# Patient Record
Sex: Female | Born: 1950 | Race: White | Hispanic: No | Marital: Married | Smoking: Never smoker
Health system: Southern US, Community
[De-identification: ages and names within clinical notes are randomized; demographics above are authoritative.]

## PROBLEM LIST (undated history)

## (undated) DIAGNOSIS — R209 Unspecified disturbances of skin sensation: Secondary | ICD-10-CM

## (undated) DIAGNOSIS — G4700 Insomnia, unspecified: Secondary | ICD-10-CM

## (undated) DIAGNOSIS — M1990 Unspecified osteoarthritis, unspecified site: Secondary | ICD-10-CM

## (undated) DIAGNOSIS — IMO0001 Reserved for inherently not codable concepts without codable children: Secondary | ICD-10-CM

## (undated) DIAGNOSIS — G5139 Clonic hemifacial spasm, unspecified: Principal | ICD-10-CM

## (undated) DIAGNOSIS — N200 Calculus of kidney: Secondary | ICD-10-CM

## (undated) DIAGNOSIS — F411 Generalized anxiety disorder: Secondary | ICD-10-CM

## (undated) DIAGNOSIS — F329 Major depressive disorder, single episode, unspecified: Secondary | ICD-10-CM

## (undated) DIAGNOSIS — J309 Allergic rhinitis, unspecified: Secondary | ICD-10-CM

## (undated) DIAGNOSIS — E039 Hypothyroidism, unspecified: Secondary | ICD-10-CM

## (undated) DIAGNOSIS — IMO0002 Reserved for concepts with insufficient information to code with codable children: Secondary | ICD-10-CM

## (undated) DIAGNOSIS — E1165 Type 2 diabetes mellitus with hyperglycemia: Secondary | ICD-10-CM

## (undated) DIAGNOSIS — K219 Gastro-esophageal reflux disease without esophagitis: Secondary | ICD-10-CM

## (undated) DIAGNOSIS — R609 Edema, unspecified: Secondary | ICD-10-CM

## (undated) DIAGNOSIS — R32 Unspecified urinary incontinence: Secondary | ICD-10-CM

## (undated) DIAGNOSIS — I10 Essential (primary) hypertension: Secondary | ICD-10-CM

## (undated) DIAGNOSIS — K279 Peptic ulcer, site unspecified, unspecified as acute or chronic, without hemorrhage or perforation: Secondary | ICD-10-CM

## (undated) DIAGNOSIS — E7800 Pure hypercholesterolemia, unspecified: Secondary | ICD-10-CM

## (undated) DIAGNOSIS — R002 Palpitations: Secondary | ICD-10-CM

## (undated) DIAGNOSIS — R9089 Other abnormal findings on diagnostic imaging of central nervous system: Secondary | ICD-10-CM

## (undated) DIAGNOSIS — G40109 Localization-related (focal) (partial) symptomatic epilepsy and epileptic syndromes with simple partial seizures, not intractable, without status epilepticus: Secondary | ICD-10-CM

## (undated) DIAGNOSIS — E785 Hyperlipidemia, unspecified: Secondary | ICD-10-CM

## (undated) DIAGNOSIS — F3289 Other specified depressive episodes: Secondary | ICD-10-CM

## (undated) DIAGNOSIS — B373 Candidiasis of vulva and vagina: Secondary | ICD-10-CM

## (undated) DIAGNOSIS — N814 Uterovaginal prolapse, unspecified: Secondary | ICD-10-CM

## (undated) DIAGNOSIS — N816 Rectocele: Secondary | ICD-10-CM

## (undated) HISTORY — DX: Reserved for concepts with insufficient information to code with codable children: IMO0002

## (undated) HISTORY — DX: Palpitations: R00.2

## (undated) HISTORY — PX: HYSTEROSCOPY: SHX211

## (undated) HISTORY — DX: Type 2 diabetes mellitus with hyperglycemia: E11.65

## (undated) HISTORY — DX: Hypothyroidism, unspecified: E03.9

## (undated) HISTORY — DX: Localization-related (focal) (partial) symptomatic epilepsy and epileptic syndromes with simple partial seizures, not intractable, without status epilepticus: G40.109

## (undated) HISTORY — DX: Reserved for inherently not codable concepts without codable children: IMO0001

## (undated) HISTORY — PX: TONGUE BIOPSY: SHX1075

## (undated) HISTORY — DX: Insomnia, unspecified: G47.00

## (undated) HISTORY — DX: Essential (primary) hypertension: I10

## (undated) HISTORY — DX: Other abnormal findings on diagnostic imaging of central nervous system: R90.89

## (undated) HISTORY — DX: Unspecified osteoarthritis, unspecified site: M19.90

## (undated) HISTORY — DX: Hyperlipidemia, unspecified: E78.5

## (undated) HISTORY — DX: Uterovaginal prolapse, unspecified: N81.4

## (undated) HISTORY — PX: ENDOMETRIAL BIOPSY: SHX622

## (undated) HISTORY — DX: Candidiasis of vulva and vagina: B37.3

## (undated) HISTORY — DX: Other specified depressive episodes: F32.89

## (undated) HISTORY — DX: Peptic ulcer, site unspecified, unspecified as acute or chronic, without hemorrhage or perforation: K27.9

## (undated) HISTORY — DX: Rectocele: N81.6

## (undated) HISTORY — DX: Allergic rhinitis, unspecified: J30.9

## (undated) HISTORY — DX: Gastro-esophageal reflux disease without esophagitis: K21.9

## (undated) HISTORY — DX: Unspecified urinary incontinence: R32

## (undated) HISTORY — DX: Generalized anxiety disorder: F41.1

## (undated) HISTORY — DX: Clonic hemifacial spasm, unspecified: G51.39

## (undated) HISTORY — DX: Major depressive disorder, single episode, unspecified: F32.9

## (undated) HISTORY — PX: DILATION AND CURETTAGE OF UTERUS: SHX78

## (undated) HISTORY — DX: Unspecified disturbances of skin sensation: R20.9

## (undated) HISTORY — DX: Calculus of kidney: N20.0

## (undated) HISTORY — DX: Edema, unspecified: R60.9

## (undated) HISTORY — DX: Pure hypercholesterolemia, unspecified: E78.00

---
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
## 1998-09-19 ENCOUNTER — Other Ambulatory Visit: Admission: RE | Admit: 1998-09-19 | Discharge: 1998-09-19 | Payer: Self-pay | Admitting: Obstetrics and Gynecology

## 1999-06-10 ENCOUNTER — Encounter (INDEPENDENT_AMBULATORY_CARE_PROVIDER_SITE_OTHER): Payer: Self-pay | Admitting: Gastroenterology

## 1999-08-27 ENCOUNTER — Other Ambulatory Visit: Admission: RE | Admit: 1999-08-27 | Discharge: 1999-08-27 | Payer: Self-pay | Admitting: Obstetrics and Gynecology

## 2000-09-06 ENCOUNTER — Other Ambulatory Visit: Admission: RE | Admit: 2000-09-06 | Discharge: 2000-09-06 | Payer: Self-pay | Admitting: Obstetrics and Gynecology

## 2000-12-29 ENCOUNTER — Ambulatory Visit: Admission: RE | Admit: 2000-12-29 | Discharge: 2000-12-29 | Payer: Self-pay | Admitting: Gynecology

## 2001-01-19 ENCOUNTER — Encounter: Payer: Self-pay | Admitting: Urology

## 2001-01-19 ENCOUNTER — Encounter: Admission: RE | Admit: 2001-01-19 | Discharge: 2001-01-19 | Payer: Self-pay | Admitting: Urology

## 2001-01-28 ENCOUNTER — Encounter: Admission: RE | Admit: 2001-01-28 | Discharge: 2001-01-28 | Payer: Self-pay | Admitting: Urology

## 2001-01-28 ENCOUNTER — Encounter: Payer: Self-pay | Admitting: Urology

## 2001-11-03 ENCOUNTER — Other Ambulatory Visit: Admission: RE | Admit: 2001-11-03 | Discharge: 2001-11-03 | Payer: Self-pay | Admitting: Obstetrics and Gynecology

## 2003-01-19 ENCOUNTER — Other Ambulatory Visit: Admission: RE | Admit: 2003-01-19 | Discharge: 2003-01-19 | Payer: Self-pay | Admitting: Obstetrics and Gynecology

## 2003-08-02 ENCOUNTER — Encounter (INDEPENDENT_AMBULATORY_CARE_PROVIDER_SITE_OTHER): Payer: Self-pay | Admitting: *Deleted

## 2003-08-02 ENCOUNTER — Ambulatory Visit (HOSPITAL_BASED_OUTPATIENT_CLINIC_OR_DEPARTMENT_OTHER): Admission: RE | Admit: 2003-08-02 | Discharge: 2003-08-02 | Payer: Self-pay | Admitting: Obstetrics and Gynecology

## 2003-08-02 ENCOUNTER — Ambulatory Visit (HOSPITAL_COMMUNITY): Admission: RE | Admit: 2003-08-02 | Discharge: 2003-08-02 | Payer: Self-pay | Admitting: Obstetrics and Gynecology

## 2004-01-23 ENCOUNTER — Other Ambulatory Visit: Admission: RE | Admit: 2004-01-23 | Discharge: 2004-01-23 | Payer: Self-pay | Admitting: Obstetrics and Gynecology

## 2005-01-23 ENCOUNTER — Other Ambulatory Visit: Admission: RE | Admit: 2005-01-23 | Discharge: 2005-01-23 | Payer: Self-pay | Admitting: Obstetrics and Gynecology

## 2006-01-26 ENCOUNTER — Other Ambulatory Visit: Admission: RE | Admit: 2006-01-26 | Discharge: 2006-01-26 | Payer: Self-pay | Admitting: Obstetrics and Gynecology

## 2006-11-16 ENCOUNTER — Ambulatory Visit: Payer: Self-pay | Admitting: Gastroenterology

## 2006-12-21 ENCOUNTER — Ambulatory Visit: Payer: Self-pay | Admitting: Gastroenterology

## 2007-02-10 ENCOUNTER — Other Ambulatory Visit: Admission: RE | Admit: 2007-02-10 | Discharge: 2007-02-10 | Payer: Self-pay | Admitting: Addiction Medicine

## 2007-09-24 DIAGNOSIS — I10 Essential (primary) hypertension: Secondary | ICD-10-CM | POA: Insufficient documentation

## 2007-09-24 DIAGNOSIS — F3289 Other specified depressive episodes: Secondary | ICD-10-CM | POA: Insufficient documentation

## 2007-09-24 DIAGNOSIS — K219 Gastro-esophageal reflux disease without esophagitis: Secondary | ICD-10-CM

## 2007-09-24 DIAGNOSIS — R519 Headache, unspecified: Secondary | ICD-10-CM | POA: Insufficient documentation

## 2007-09-24 DIAGNOSIS — E0590 Thyrotoxicosis, unspecified without thyrotoxic crisis or storm: Secondary | ICD-10-CM | POA: Insufficient documentation

## 2007-09-24 DIAGNOSIS — N200 Calculus of kidney: Secondary | ICD-10-CM

## 2007-09-24 DIAGNOSIS — F329 Major depressive disorder, single episode, unspecified: Secondary | ICD-10-CM | POA: Insufficient documentation

## 2007-09-24 DIAGNOSIS — K449 Diaphragmatic hernia without obstruction or gangrene: Secondary | ICD-10-CM | POA: Insufficient documentation

## 2007-09-24 DIAGNOSIS — R51 Headache: Secondary | ICD-10-CM

## 2007-09-27 ENCOUNTER — Encounter: Payer: Self-pay | Admitting: Gastroenterology

## 2008-02-29 ENCOUNTER — Other Ambulatory Visit: Admission: RE | Admit: 2008-02-29 | Discharge: 2008-02-29 | Payer: Self-pay | Admitting: Obstetrics and Gynecology

## 2008-02-29 ENCOUNTER — Ambulatory Visit: Payer: Self-pay | Admitting: Obstetrics and Gynecology

## 2008-02-29 ENCOUNTER — Encounter: Payer: Self-pay | Admitting: Obstetrics and Gynecology

## 2008-06-12 ENCOUNTER — Encounter: Admission: RE | Admit: 2008-06-12 | Discharge: 2008-06-12 | Payer: Self-pay | Admitting: Internal Medicine

## 2009-05-15 ENCOUNTER — Other Ambulatory Visit: Admission: RE | Admit: 2009-05-15 | Discharge: 2009-05-15 | Payer: Self-pay | Admitting: Obstetrics and Gynecology

## 2009-05-15 ENCOUNTER — Ambulatory Visit: Payer: Self-pay | Admitting: Obstetrics and Gynecology

## 2010-06-04 ENCOUNTER — Other Ambulatory Visit
Admission: RE | Admit: 2010-06-04 | Discharge: 2010-06-04 | Payer: Self-pay | Source: Home / Self Care | Admitting: Obstetrics and Gynecology

## 2010-06-04 ENCOUNTER — Ambulatory Visit
Admission: RE | Admit: 2010-06-04 | Discharge: 2010-06-04 | Payer: Self-pay | Source: Home / Self Care | Attending: Obstetrics and Gynecology | Admitting: Obstetrics and Gynecology

## 2010-06-24 ENCOUNTER — Ambulatory Visit
Admission: RE | Admit: 2010-06-24 | Discharge: 2010-06-24 | Payer: Self-pay | Source: Home / Self Care | Attending: Obstetrics and Gynecology | Admitting: Obstetrics and Gynecology

## 2010-10-14 NOTE — Assessment & Plan Note (Signed)
[facility]                                 ON-CALL NOTE   NAME:SELFMarijke, Guadiana                           MRN:          [id number]  DATE:12/21/2006                            DOB:          24-Nov-1950    Ms. Kostelecky had a colonoscopy today.  The procedure went very smoothly.  I  believe it was essentially normal.  No polyps were removed.  She has  done well except this afternoon after she woke up, she had a hamburger  and then has felt some indigestion and some discomfort, not in her  abdomen but in her very low substernal area.  She has been belching a  bit.  She thinks this may be some indigestion or acid related problems.  He has already taken a Nexium about an hour ago and has not noticed much  difference.  I recommended she take a couple of Tums and a Gas-X if she  has that and she knows to call if she has any further trouble.  I  explained that I am on call all night and she knows to get back in  touch.     Rachael Fee, MD  Electronically Signed    DPJ/MedQ  DD: 12/21/2006  DT: 12/22/2006  Job #: [id number]

## 2010-10-14 NOTE — Assessment & Plan Note (Signed)
[facility]                         GASTROENTEROLOGY OFFICE NOTE   NAME:Fletcher, Kelly GUILE                         MRN:          [id number]  DATE:11/16/2006                            DOB:          07-05-1950    PRIMARY CARE PHYSICIAN:  Dr. Murray Hodgkins.  She was Rigdon referred.   CHIEF COMPLAINT:  Nausea, constipation, need colonoscopy.   HISTORY OF PRESENT ILLNESS:  [name] is a very pleasant 60 year old  woman who underwent screening colonoscopy by Dr. Victorino Dike in 2001.  He described a relatively poor prep, and has recommended that she have a  repeat colonoscopy for the past 2-3 years and she has declined that  advice.  She has been bothered by rather classic GERD symptoms for many  years with pyrosis.  The pyrosis seems to have improved on once daily  Nexium that she takes before going to bed at night.  She does have  rather chronic nausea that may be GERD related.  She had an EGD by Dr.  Victorino Dike in 2001.  He described mild esophagitis without Barrett's.  She has very rare, solid-food dysphagia.  She eats while standing up and  walking around.  She admits she does not chew her food very well.  She  has no vomiting, no overt GI bleeding.   She has chronic constipation.  She has tried fiber supplements for that,  but that caused too much gas.  She has tried Printmaker and that seems to  worsen the acid in her stomach.   REVIEW OF SYSTEMS:  Notable for a 15-pound weight gain in the past 6  months.   PAST MEDICAL HISTORY:  1. Hypertension.  2. Elevated cholesterol.  3. Depression.  4. Hyperthyroid.  5. History of kidney stones.  6. History of chronic headaches.  7. Stomach ulcer when she was 60 years old.   CURRENT MEDICATIONS:  1. Lipitor.  2. Nexium.  3. Levothyroxine.  4. Triamterine/hydrochlorothiazide.   ALLERGIES:  PAXIL and LIDOCAINE.   SOCIAL HISTORY:  Married with 1 son.  Lives with her husband, nonsmoker,  nondrinker.  Drinks  2 large [employer], half caffeinated daily.   FAMILY HISTORY:  No colon cancer, colon polyps in family.   PHYSICAL EXAMINATION:  Height 5 feet 9 inches, weight 199 pounds, blood  pressure 152/76, pulse 64.  CONSTITUTIONAL:  Generally well appearing.  NEUROLOGIC:  Alert and oriented x3.  EYES:  Extraocular movements intact.  MOUTH:  Oropharynx moist, no lesions.  NECK:  Supple, no lymphadenopathy.  CARDIOVASCULAR:  Heart regular rate and rhythm.  LUNGS:  Clear to auscultation bilaterally.  ABDOMEN:  Soft, nontender, nondistended, normal bowel sounds.  EXTREMITIES:  No lower extremity edema.  SKIN:  No rash or lesions on visible extremities.   ASSESSMENT AND PLAN:  A 60 year old woman with gastroesophageal reflux  disease, chronic constipation.   First, her colonoscopy in 2001, she had somewhat of a poor prep, and, I  think, she has been recommended to have followup colonoscopy since then.  She has declined this in the past, but is agreeable now.  We will,  therefore, arrange for her to have a colonoscopy at her soonest  convenience.  I would like, however, first to get her on a stable dose  of MiraLax.  She will, therefore, start MiraLax 1 scoop once daily.  She  knows she can adjust this up or down as needed.  She does have rather  classic pyrosis, but this is under control with the Nexium she takes on  a nightly basis.  Her nausea may be related to gastroesophageal reflux  disease.  I recommended she change the way she is taking her Nexium to  20-30 minutes prior to her dinner meal.  That is the way the pill is  designed to work most effectively.  I see no reason for any further  blood tests or imaging studies.  I will discuss her response to MiraLax  and this changing dose of her Nexium at the time of her colonoscopy in 2-  3 weeks.     Rachael Fee, MD  Electronically Signed    DPJ/MedQ  DD: 11/16/2006  DT: 11/16/2006  Job #: [id number]   cc:   Lenon Curt. Chilton Si, M.D.

## 2010-10-17 NOTE — Op Note (Signed)
NAME:  Kelly Fletcher, Kelly Fletcher                            ACCOUNT NO.:  [id number]   MEDICAL RECORD NO.:  [id number]                   PATIENT TYPE:  AMB   LOCATION:  [facility]                                 FACILITY:  [facility]   PHYSICIAN:  Daniel L. Eda Paschal, M.D.           DATE OF BIRTH:  [birth year]   DATE OF PROCEDURE:  08/02/2003  DATE OF DISCHARGE:                                 OPERATIVE REPORT   PREOPERATIVE DIAGNOSIS:  Postmenopausal bleeding with intrauterine cavity  lesion.   POSTOPERATIVE DIAGNOSIS:  Postmenopausal bleeding with intrauterine cavity  lesion.   OPERATION:  Hysteroscopy with resection of intrauterine cavity lesion.   SURGEON:  Daniel L. Eda Paschal, M.D.   ANESTHESIA:  General.   INDICATIONS:  The patient is a 60 year old female who presented to the  office with abnormal postmenopausal bleeding.  Ultrasound revealed an  abnormal endometrial cavity which was enlarged.  She now enters the hospital  for hysteroscopy, resection of this lesion, as well as sampling.   FINDINGS:  External exam is normal.  Vaginal examination reveals a second-  degree cystocele.  Cervix is clean.  Uterus is retroverted, normal size and  shape with almost second-degree descensus.  Adnexa are palpably normal.  At  the time of hysteroscopy, the patient had a large 2-3 cm lesion coming off  the lower posterior wall of the fundus.  It appeared to have some  consistency somewhat between a myoma and a polyp.  After it was removed, the  rest of the cavity as well as the endocervical canal were free of disease.   DESCRIPTION OF PROCEDURE:  After adequate general endotracheal anesthesia,  the patient was placed in the dorsal lithotomy position, prepped and draped  in the usual sterile manner.  A single-tooth tenaculum was placed in the  anterior lip of the cervix.  The cervix was dilated to a #31 Pratt dilator.  A hysteroscopic resectoscope was used with a 9-degree wire loop with  settings of 70 coag,  110 cutting.  Sorbitol 3% was used to expand the  intrauterine cavity, and a camera was used for magnification.  A large  lesion was seen as described above.  Using the 90-degree wire loop, this  area was resected in multiple bites until it was completely removed.  The  surgeon really could not be sure whether it was a myoma or a polyp.  The  consistency was too hard for a normal polyp, but I was not completely sure  that it was a myoma.  After it had been completely resected, all the small  bleeding points were coagulated, and the procedure was terminated.  Fluid  deficit was less than 200 mL.  Blood loss was less than 50 mL.  The patient  tolerated the procedure well and left the operating room in satisfactory  condition.  Daniel L. Eda Paschal, M.D.    Tonette Bihari  D:  08/02/2003  T:  08/02/2003  Job:  [id number]

## 2010-10-17 NOTE — Consult Note (Signed)
[facility]  Patient:    Kelly Fletcher, Kelly Fletcher                         MRN: [id number] Proc. Date: 12/29/00 Adm. Date:  [id number] Attending:  Jeannette Corpus CC:         Rande Brunt. Eda Paschal, M.D.  Telford Nab, R.N.   Consultation Report  CLINIC NOTE  This is a 60 year old white female who was referred by Dr. Edyth Gunnels for consultation regarding management of a elevated CA-125. The patient has had ovarian cysts followed off and on for the past two years by serial ultrasounds. The two most recent ultrasounds are provided for my review indicating that in April the patient had a complex cyst in her left ovary measuring 19 x 21 x 20 mm and a thick walled cyst in the right ovary measuring 12 x 8 x 9 mm with internal low level echoes. The patient also noted to have myomas of the uterus which are on the order of slightly over 2 cm in diameter. At that time (April), the patients CA-125 value was 23. Followup ultrasound on July 3 showed resolution of the right ovarian cyst and essentially resolving follicle in the left cyst as well. The myomas were essentially unchanged. However, her CA-125 value was 43 units/ml. The patient denies any pelvic pain or pressure. She continues to have relatively regular cyclic menses, although she has skipped a period in the winter.  The patient does have some concern because her mother may have had ovarian cancer, dying at age 58 within approximately three weeks from the time of initial diagnosis. No autopsy or surgery was performed so we are uncertain as to the exact diagnosis.  PAST MEDICAL HISTORY:  Medical illnesses: Elevated cholesterol, seasonal allergies, hypothyroidism.  PAST SURGICAL HISTORY:  Tonsils and adenoidectomy.  OBSTETRICAL HISTORY:  Gravida 1.  DRUG ALLERGIES:  PAXIL causes numbness in her face and arm.  CURRENT MEDICATIONS:  Synthroid, Lipitor, Clarinex, Flonase,  and antihypertensive.  REVIEW OF SYSTEMS:  Essentially negative.  PHYSICAL EXAMINATION:  VITAL SIGNS:  Height 5 feet 9 inches. Weight 192 pounds. Blood pressure 160/90, pulse 86, respiratory rate 18.  GENERAL:  Healthy white female in no acute distress.  HEENT:  Negative.  NECK:  Supple without thyromegaly.  ABDOMEN:  Soft, nontender. No masses, organomegaly, ascites, or hernias are noted.  PELVIC:  EGBUS normal. Vagina is clean. Cervix is normal. Uterus is anterior, normal shape, size, and consistency. I cannot palpate any adnexal masses. Rectovaginal exam confirms.  IMPRESSION:  Patient with an elevated CA-125 and uterine fibroids. It is my opinion that the patient does not have ovarian cancer, and that it is most likely her CA-125 is secondary to her uterine fibroids. While it is elevated, it is relatively low and might be further explained depending upon the portion of the menstrual cycle she was in when it was drawn.  PLAN:  I would recommend the patient have a repeat CA-125 value in approximately three months and that we not draw the CA-125 during her menstrual period. We will at this position continue to observe her but would not recommend any surgical intervention at this time. She will return to Dr. Delorise Royals office in approximately three months for a repeat CA-125 value. DD:  12/29/00 TD:  12/29/00 Job: [id number]

## 2010-12-23 ENCOUNTER — Encounter: Payer: Self-pay | Admitting: Obstetrics and Gynecology

## 2011-07-22 ENCOUNTER — Ambulatory Visit (INDEPENDENT_AMBULATORY_CARE_PROVIDER_SITE_OTHER): Payer: 59 | Admitting: Obstetrics and Gynecology

## 2011-07-22 ENCOUNTER — Other Ambulatory Visit (HOSPITAL_COMMUNITY)
Admission: RE | Admit: 2011-07-22 | Discharge: 2011-07-22 | Disposition: A | Discharge status: Home / Self Care | Payer: 59 | Source: Ambulatory Visit | Attending: Obstetrics and Gynecology | Admitting: Obstetrics and Gynecology

## 2011-07-22 ENCOUNTER — Encounter: Payer: Self-pay | Admitting: Obstetrics and Gynecology

## 2011-07-22 VITALS — BP 136/80 | Ht 69.0 in | Wt 167.0 lb

## 2011-07-22 DIAGNOSIS — Z01419 Encounter for gynecological examination (general) (routine) without abnormal findings: Secondary | ICD-10-CM | POA: Insufficient documentation

## 2011-07-22 DIAGNOSIS — N898 Other specified noninflammatory disorders of vagina: Secondary | ICD-10-CM

## 2011-07-22 DIAGNOSIS — R32 Unspecified urinary incontinence: Secondary | ICD-10-CM | POA: Insufficient documentation

## 2011-07-22 DIAGNOSIS — N899 Noninflammatory disorder of vagina, unspecified: Secondary | ICD-10-CM

## 2011-07-22 LAB — WET PREP FOR TRICH, YEAST, CLUE: Yeast Wet Prep HPF POC: NONE SEEN

## 2011-07-22 NOTE — Progress Notes (Signed)
Patient came to see me today for her annual GYN exam. She is doing well without HRT. She is having no vaginal bleeding. She is having no pelvic pain. She is up-to-date on mammograms. She's had 2 normal bone densities. She does her lab her PCP. She had some vaginal itching and treated her Delduca with over-the-counter medication. She is now asymptomatic but wanted  to be sure was gone.  Physical examination:  Kelly Fletcher present. HEENT within normal limits. Neck: Thyroid not large. No masses. Supraclavicular nodes: not enlarged. Breasts: Examined in both sitting midline position. No skin changes and no masses. Abdomen: Soft no guarding rebound or masses or hernia. Pelvic: External: Within normal limits. BUS: Within normal limits. Vaginal:within normal limits. Good estrogen effect. No evidence of cystocele rectocele or enterocele. Cervix: clean. Uterus: Normal size and shape. Adnexa: No masses. Rectovaginal exam: Confirmatory and negative. Extremities: Within normal limits.  Wet prep negative.  Assessment: Normal GYN exam  Plan: Continue yearly mammograms

## 2011-07-23 LAB — URINALYSIS W MICROSCOPIC + REFLEX CULTURE
Bacteria, UA: NONE SEEN
Bilirubin Urine: NEGATIVE
Casts: NONE SEEN
Crystals: NONE SEEN
Glucose, UA: NEGATIVE mg/dL
Hgb urine dipstick: NEGATIVE
Ketones, ur: NEGATIVE mg/dL
Nitrite: NEGATIVE
Specific Gravity, Urine: 1.014 (ref 1.005–1.030)
pH: 7 (ref 5.0–8.0)

## 2011-12-28 ENCOUNTER — Encounter: Payer: Self-pay | Admitting: Obstetrics and Gynecology

## 2012-11-08 ENCOUNTER — Other Ambulatory Visit: Payer: Self-pay | Admitting: *Deleted

## 2012-11-08 ENCOUNTER — Telehealth: Payer: Self-pay | Admitting: *Deleted

## 2012-11-08 MED ORDER — HYDROCHLOROTHIAZIDE 25 MG PO TABS: ORAL_TABLET | ORAL | Status: DC

## 2012-11-08 NOTE — Telephone Encounter (Signed)
Patient stated that when she was in last  Dr Chilton Si told her to cut her Losartan in half. Patient was wondering if she could have a low dose fluid pill when she needs it.  [phone] [facility]

## 2012-11-08 NOTE — Telephone Encounter (Signed)
Patient has been notified

## 2012-11-08 NOTE — Telephone Encounter (Signed)
Tell her we will send Rx for HCTZ 25 mg (30) One daily as needed to control edema

## 2012-12-09 ENCOUNTER — Encounter: Payer: Self-pay | Admitting: *Deleted

## 2012-12-09 ENCOUNTER — Other Ambulatory Visit: Payer: 59

## 2012-12-09 DIAGNOSIS — E079 Disorder of thyroid, unspecified: Secondary | ICD-10-CM

## 2012-12-09 DIAGNOSIS — I10 Essential (primary) hypertension: Secondary | ICD-10-CM

## 2012-12-09 DIAGNOSIS — E119 Type 2 diabetes mellitus without complications: Secondary | ICD-10-CM

## 2012-12-10 LAB — COMPREHENSIVE METABOLIC PANEL
Albumin: 4.1 g/dL (ref 3.6–4.8)
Alkaline Phosphatase: 47 IU/L (ref 39–117)
BUN/Creatinine Ratio: 22 (ref 11–26)
BUN: 17 mg/dL (ref 8–27)
CO2: 25 mmol/L (ref 18–29)
Calcium: 9.3 mg/dL (ref 8.6–10.2)
Creatinine, Ser: 0.78 mg/dL (ref 0.57–1.00)
Globulin, Total: 2.1 g/dL (ref 1.5–4.5)
Glucose: 100 mg/dL — ABNORMAL HIGH (ref 65–99)
Total Protein: 6.2 g/dL (ref 6.0–8.5)

## 2012-12-10 LAB — LIPID PANEL
Chol/HDL Ratio: 2.6 ratio units (ref 0.0–4.4)
Cholesterol, Total: 147 mg/dL (ref 100–199)
HDL: 57 mg/dL (ref >39)
LDL Calculated: 80 mg/dL (ref 0–99)
Triglycerides: 52 mg/dL (ref 0–149)
VLDL Cholesterol Cal: 10 mg/dL (ref 5–40)

## 2012-12-12 ENCOUNTER — Encounter: Payer: Self-pay | Admitting: *Deleted

## 2012-12-13 ENCOUNTER — Ambulatory Visit (INDEPENDENT_AMBULATORY_CARE_PROVIDER_SITE_OTHER): Payer: 59 | Admitting: Internal Medicine

## 2012-12-13 ENCOUNTER — Encounter: Payer: Self-pay | Admitting: Internal Medicine

## 2012-12-13 VITALS — BP 156/78 | HR 66 | Temp 98.0°F | Resp 14 | Ht 69.0 in | Wt 159.0 lb

## 2012-12-13 DIAGNOSIS — E039 Hypothyroidism, unspecified: Secondary | ICD-10-CM

## 2012-12-13 DIAGNOSIS — F411 Generalized anxiety disorder: Secondary | ICD-10-CM | POA: Insufficient documentation

## 2012-12-13 DIAGNOSIS — K219 Gastro-esophageal reflux disease without esophagitis: Secondary | ICD-10-CM

## 2012-12-13 DIAGNOSIS — I10 Essential (primary) hypertension: Secondary | ICD-10-CM

## 2012-12-13 DIAGNOSIS — R609 Edema, unspecified: Secondary | ICD-10-CM

## 2012-12-13 DIAGNOSIS — E079 Disorder of thyroid, unspecified: Secondary | ICD-10-CM

## 2012-12-13 DIAGNOSIS — E119 Type 2 diabetes mellitus without complications: Secondary | ICD-10-CM

## 2012-12-13 DIAGNOSIS — E785 Hyperlipidemia, unspecified: Secondary | ICD-10-CM

## 2012-12-13 MED ORDER — LORAZEPAM 1 MG PO TABS: ORAL_TABLET | ORAL | Status: DC

## 2012-12-13 MED ORDER — LOSARTAN POTASSIUM 50 MG PO TABS: 50.0000 mg | ORAL_TABLET | Freq: Every day | ORAL | Status: DC

## 2012-12-13 MED ORDER — ATORVASTATIN CALCIUM 10 MG PO TABS: ORAL_TABLET | ORAL | Status: DC

## 2012-12-13 NOTE — Progress Notes (Signed)
Date: 12/13/2012  MRN:  [id number] Name:  Kelly Fletcher Sex:  female Age:  62 y.o. DOB:[birth year]   [facility] #:      [id number]                 Level Of Care:independent Provider: Murray Hodgkins, MD  Emergency Contacts: Contact Information   Name Relation Home Work Mobile   Garner,Patrick Other [phone]        Code Status: Full code  Allergies: Allergies  Allergen Reactions  . Latex   . Lipitor (Atorvastatin)   . Naproxen   . Nitrofurantoin Monohyd Macro   . Paroxetine Hcl   . Prednisone   . Prozac (Fluoxetine Hcl)      Chief Complaint  Patient presents with  . Annual Exam    Complains of BP going up and down     HPI: Generally feeling Ok. Mild elevations in SBP. Sometimes gets low BP. DM is under control.  Type II or unspecified type diabetes mellitus without mention of complication, not stated as uncontrolled -   Unspecified essential hypertension -having episodes of high and low Bp. BP has been about 160 systolic for more than a wee. She has started a new supplement call [employer]. Product literature is unclear regarding ingredients.  Other and unspecified hyperlipidemia  Anxiety state, unspecified - Using LORazepam (ATIVAN) 1 MG tablet  GERD: episodes of heartburn.  Unspecified hypothyroidism: controlled  Edema: generally better. Still using HCTZ when she feels swollen     Past Medical History  Diagnosis Date  . Endometrial polyp   . Uterine prolapse   . Cystocele   . GERD (gastroesophageal reflux disease)   . Elevated cholesterol   . Rectocele   . Hypertension   . Arthritis   . Thyroid disease   . Type II or unspecified type diabetes mellitus without mention of complication, uncontrolled     Type 2  . Urinary incontinence     Urgency  . Anxiety state, unspecified   . Palpitations   . Allergic rhinitis, cause unspecified   . Other and unspecified hyperlipidemia   . Unspecified hypothyroidism   . Edema   . Insomnia, unspecified   .  Depressive disorder, not elsewhere classified   . Unspecified urinary incontinence   . Peptic ulcer, unspecified site, unspecified as acute or chronic, without mention of hemorrhage, perforation, or obstruction   . Calculus of kidney   . Disturbance of skin sensation     Past Surgical History  Procedure Laterality Date  . Tonsillectomy    . Dilation and curettage of uterus      3-05  . Hysteroscopy      3-05     Procedures: 1980-EGD 1986-ETT:Normal 1997-IVP:2 stones left side 1998-Ultrasound abdomen:HH, Reflux, spasm of pyloric channel 1999-RNA:Negative 2002-Renal Ultrasound/Pelvic Ultrasound:Normal 2002-CT Abdomen:Normal 11-2006 Colonoscopy Dr. Gerilyn Pilgrim 11-2006 Mammogram 01/2008 Pap Normal Dr. Edyth Gunnels  11/2007 Mammogram Normal  06/2010 Bone Density normal for age Dr. Eda Paschal  12/22/2010 Mammogram negative   Consultants: GI-Dr.Fairbury Allergist-Dr.McDonough Hand Surgeon-Dr.Sypher Neurologist-Dr.Adelman GYN-Dr.Gottsegen Urologist-Dr.Ottelin Cardiologist-Dr.Grove Orthopedic-Dr.Norris Acupuncture-Dr.Mitchell Bloom Eye-Dr.Sallie Hyacinth Meeker GI -Dr. Gerilyn Pilgrim    Current Outpatient Prescriptions  Medication Sig Dispense Refill  . cholecalciferol (VITAMIN D) 1000 UNITS tablet Take 1,000 Units by mouth daily.      Marland Kitchen co-enzyme Q-10 30 MG capsule Take 30 mg by mouth 3 (three) times daily.      Marland Kitchen esomeprazole (NEXIUM) 40 MG capsule Take 40 mg by mouth daily before breakfast.      .  fish oil-omega-3 fatty acids 1000 MG capsule Take 2 g by mouth daily.      . hydrochlorothiazide (HYDRODIURIL) 25 MG tablet Take one tablet once a day to control  30 tablet  5  . levothyroxine (SYNTHROID, LEVOTHROID) 125 MCG tablet Take 125 mcg by mouth daily.      Marland Kitchen LORazepam (ATIVAN) 1 MG tablet Take one tablet up to three times daily as needed for anxiety      . losartan-hydrochlorothiazide (HYZAAR) 50-12.5 MG per tablet Take 1/2 tablet once daily to control blood pressure      . metFORMIN  (GLUCOPHAGE) 500 MG tablet Take 500 mg by mouth 2 (two) times daily with a meal.      . RESVERATROL PO Take by mouth. WITH D             There is no immunization history on file for this patient.   Diet: NCS, NAS  History  Substance Use Topics  . Smoking status: Never Smoker   . Smokeless tobacco: Never Used  . Alcohol Use: No    Family History  Problem Relation Age of Onset  . Hypertension Mother   . Breast cancer Mother   . Heart disease Father   . Stroke Father   . Hypertension Sister   . Heart disease Sister   . Osteoporosis Sister   . Diabetes Brother   . Hypertension Brother   . Heart disease Brother   . Other Brother     BRAIN TUMOR  . Cancer Brother     Eye cancer-Melanoma  . Diabetes Brother   FATHER:    The father is deceased.  The cause of death was myocardial infarction.  Death occurred at age 77. MOTHER:   The mother is deceased.  The cause of death was cancer.  Death occurred at age 39. SIBLINGS:   1)  The patient's brother is deceased.  The cause of death was brain cancer.  Death occurred at age 46.Paul 2)  The patient's brother is living.  Illnesses:  Heart disease. MI Barrett Esoph age 16 Larry 3)  The patient's brother is living.  Illnesses:  Heart disease. Molly Maduro age 43 4)  The patient's sister is deceased.  The cause of death was myocardial infarction.  Death occurred at age 90. Helen 5)  The patient's sister is living.  Illnesses:  Heart disease. stent Scarlette Calico age 48 6)  The patient's sister is living. and well Doris age 5 7) The patient's brother is living.  Illnesses:  Heart disease. open heart surgery Onalee Hua age 18 8) The patient's brother is deceased.  The cause of death was myocardial infarction, complications of diabetes.  Death occurred at age 16. 27)  Infant Deceased at birth CHILDREN:   1)  The patient's son is living. Arlys John  Review of systems DATA OBTAINED: from patient GENERAL: Feels well   No fevers, fatigue, change in appetite or  weight SKIN: No itch, rash or open wounds EYES: No eye pain, dryness or itching  No change in vision EARS: Mild hearing loss NOSE: No congestion, drainage or bleeding. Prior hx of epistaxis, but not recently. MOUTH/THROAT: No mouth or tooth pain  No sore throat No difficulty chewing or swallowing RESPIRATORY: No cough, wheezing, SOB CARDIAC: No chest pain, palpitations. Trace edema. CHEST/BREASTS: No discomfort, discharge or lumps in breasts GI: No abdominal pain  No N/V/D or constipation  No heartburn or reflux  GU: No dysuria, frequency or urgency  No change in urine volume  or character No nocturia or change in stream   MUSCULOSKELETAL: No joint pain, swelling or stiffness  No back pain  No muscle ache, pain, weakness  Gait is steady  No recent falls.  NEUROLOGIC: No dizziness, fainting, headache, imbalance, numbness  No change in mental status.  PSYCHIATRIC: Chronic stress related to care of her mother-in -law and her son's child.  ENDO: hx diabetes  Vital signs: BP 156/78  Pulse 66  Temp(Src) 98 F (36.7 C) (Oral)  Resp 14  Ht 5\' 9"  (1.753 m)  Wt 159 lb (72.122 kg)  BMI 23.47 kg/m2  GENERAL APPEARANCE: No acute distress, appropriately groomed, normal body habitus. Alert, pleasant, conversant. SKIN: No diaphoresis rash, unusual lesions, wounds HEAD: Normocephalic, atraumatic EYES: Conjunctiva/lids clear. Pupils round, reactive. EOMs intact.  EARS: External exam WNL, canals clear, TM WNL. Hearing mildly diminished bilaterally NOSE: No deformity or discharge. MOUTH/THROAT: Lips w/o lesions. Oral mucosa, tongue moist, w/o lesion. Oropharynx w/o redness or lesions.  NECK: Supple, full ROM. No thyroid tenderness, enlargement or nodule LYMPHATICS: No head, neck or supraclavicular adenopathy RESPIRATORY: Breathing is even, unlabored. Lung sounds are clear and full.  CARDIOVASCULAR: Heart RRR. No murmur or extra heart sounds  ARTERIAL: No carotid, aortic or femoral bruit. Carotid,  Femoral, Popliteal, DP,PT pulse 2+.  VENOUS: No varicosities. No venous stasis skin changes  EDEMA: No peripheral or periorbital edema. No ascites GASTROINTESTINAL: Abdomen is soft, non-tender, not distended w/ normal bowel sounds. No hepatic or splenic enlargement. No mass, ventral or inguinal hernia.  RECTAL: to be done by GYN GENITOURINARY: Bladder non tender, not distended.  FEMALE:to be done by GYN, Dr. Eda Paschal MUSCULOSKELETAL: Moves all extremities with full ROM, strength and tone. Back is without kyphosis, scoliosis or spinal process tenderness. Gait is steady NEUROLOGIC: Oriented to time, place, person. Cranial nerves 2-12 grossly intact, speech clear, no tremor. Patella, brachial DTR 2+. PSYCHIATRIC: Mood and affect appropriate to situation   Screening Score  MMS    PHQ2    PHQ9     Fall Risk    BIMS    Annual summary: Hospitalizations: none in the last year Infection History: none of significance Functional assessment: independent in al ADL Areas of potential improvement: none Rehabilitation Potential: not pertinent Prognosis for survival: good  Plan: Type II or unspecified type diabetes mellitus without mention of complication, not stated as uncontrolled - Plan: Microalbumin/Creatinine Ratio, Urine, Comprehensive metabolic panel, Hemoglobin A1c  Unspecified essential hypertension - Plan: Microalbumin/Creatinine Ratio, Urine, losartan (COZAAR) 50 MG tablet, Comprehensive metabolic panel  Unspecified disorder of thyroid - Plan: Microalbumin/Creatinine Ratio, Urine, TSH  Other and unspecified hyperlipidemia - Plan: atorvastatin (LIPITOR) 10 MG tablet, Lipid panel  Anxiety state, unspecified - Plan: LORazepam (ATIVAN) 1 MG tablet  GERD: continue current med  Unspecified hypothyroidism: stable on current med  Edema: very mild. Will continue to use HCTZ periodically

## 2012-12-13 NOTE — Patient Instructions (Signed)
Continue current medications as listed below.

## 2013-01-03 ENCOUNTER — Encounter: Payer: Self-pay | Admitting: Internal Medicine

## 2013-01-24 ENCOUNTER — Other Ambulatory Visit: Payer: Self-pay | Admitting: Internal Medicine

## 2013-04-06 ENCOUNTER — Other Ambulatory Visit: Payer: Self-pay

## 2013-04-07 ENCOUNTER — Other Ambulatory Visit: Payer: Self-pay | Admitting: *Deleted

## 2013-04-07 MED ORDER — LEVOTHYROXINE SODIUM 125 MCG PO TABS: 125.0000 ug | ORAL_TABLET | Freq: Every day | ORAL | Status: DC

## 2013-04-17 ENCOUNTER — Other Ambulatory Visit: Payer: Self-pay | Admitting: Internal Medicine

## 2013-05-04 ENCOUNTER — Other Ambulatory Visit: Payer: Self-pay | Admitting: *Deleted

## 2013-05-04 MED ORDER — HYDROCHLOROTHIAZIDE 25 MG PO TABS: ORAL_TABLET | ORAL | Status: DC

## 2013-06-07 ENCOUNTER — Encounter: Payer: Self-pay | Admitting: Gynecology

## 2013-06-07 ENCOUNTER — Ambulatory Visit (INDEPENDENT_AMBULATORY_CARE_PROVIDER_SITE_OTHER): Payer: 59 | Admitting: Gynecology

## 2013-06-07 VITALS — BP 134/80 | Ht 69.0 in | Wt 162.0 lb

## 2013-06-07 DIAGNOSIS — N8111 Cystocele, midline: Secondary | ICD-10-CM

## 2013-06-07 DIAGNOSIS — N3281 Overactive bladder: Secondary | ICD-10-CM

## 2013-06-07 DIAGNOSIS — N816 Rectocele: Secondary | ICD-10-CM

## 2013-06-07 DIAGNOSIS — Z01419 Encounter for gynecological examination (general) (routine) without abnormal findings: Secondary | ICD-10-CM

## 2013-06-07 DIAGNOSIS — N814 Uterovaginal prolapse, unspecified: Secondary | ICD-10-CM

## 2013-06-07 DIAGNOSIS — N318 Other neuromuscular dysfunction of bladder: Secondary | ICD-10-CM

## 2013-06-07 DIAGNOSIS — Z23 Encounter for immunization: Secondary | ICD-10-CM

## 2013-06-07 DIAGNOSIS — N952 Postmenopausal atrophic vaginitis: Secondary | ICD-10-CM

## 2013-06-07 NOTE — Progress Notes (Signed)
Kelly Fletcher Nov 28, 1950 [id number]        63 y.o.  G1P1001 for annual exam.  Former patient of Dr. Cherylann Banas. Several issues noted below.  Past medical history,surgical history, problem list, medications, allergies, family history and social history were all reviewed and documented in the EPIC chart.  ROS:  Performed and pertinent positives and negatives are included in the history, assessment and plan .  Exam: [name] assistant Filed Vitals:   06/07/13 1525  BP: 134/80  Height: 5\' 9"  (1.753 m)  Weight: 162 lb (73.483 kg)   General appearance  Normal Skin grossly normal Head/Neck normal with no cervical or supraclavicular adenopathy thyroid normal Lungs  clear Cardiac RR, without RMG Abdominal  soft, nontender, without masses, organomegaly or hernia Breasts  examined lying and sitting without masses, retractions, discharge or axillary adenopathy. Pelvic  Ext/BUS/vagina  Normal with generalized atrophic changes. First to second-degree cystocele. First to second-degree uterine prolapse. First degree rectocele.  Cervix  Normal with first to second degree prolapse  Uterus  anteverted, normal size, shape and contour, midline and mobile nontender   Adnexa  Without masses or tenderness    Anus and perineum  Normal   Rectovaginal  Normal sphincter tone without palpated masses or tenderness.    Assessment/Plan:  63 y.o. G107P1001 female for annual exam.   1. Postmenopausal/atrophic genital changes. Patient without significant symptoms of hot flashes, night sweats, vaginal dryness. Is not sexually active. No vaginal bleeding. Will monitor at present. Call if any vaginal bleeding. 2. Uterine prolapse/cystocele/rectocele. Patient has been dealing with this for some time. It is not overly bothered by symptoms. Options of observation, pessary and surgery reviewed. Patient does not want to consider surgery or pessary at this time but prefers observation. Will followup if she wants to pursue alternative  management. 3. Urge incontinence. Patient with classic urgency type incontinence. Does not appear to have significant SUI but mostly has an urgency to go with incontinence if she does not make it to her and time. Will check UA today. Options include OAB medication reviewed. Patient may try OTC Oxytrol. Declines prescription medication at this time. 4. Pap smear 07/2011. No Pap smear done today. No history of significant abnormal Pap smears. Plan repeat Pap smear 3 year interval. 5. Mammography 11/2012. Continue with annual mammography. SBE monthly reviewed. 6. DEXA 2012 normal. Plan repeat at 5 year interval. 7. Colonoscopy 2009. Patient just called her gastroenterologist who recommended repeat a 10 year interval. 8. Health maintenance. No blood work done as this is all done through her primary physician's office. Followup one year, sooner as needed.   Note: This document was prepared with digital dictation and possible smart phrase technology. Any transcriptional errors that result from this process are unintentional.   Anastasio Auerbach MD, 3:55 PM 06/07/2013

## 2013-06-07 NOTE — Patient Instructions (Signed)
Followup in one year for annual exam, sooner as needed. Try OTC Oxytrol for bladder symptoms. Followup with me if you want to pursue alternative medication.

## 2013-06-07 NOTE — Addendum Note (Signed)
Addended by: Nelva Nay on: 06/07/2013 04:18 PM   Modules accepted: Orders

## 2013-06-08 LAB — URINALYSIS W MICROSCOPIC + REFLEX CULTURE
BACTERIA UA: NONE SEEN
Bilirubin Urine: NEGATIVE
CASTS: NONE SEEN
CRYSTALS: NONE SEEN
Glucose, UA: NEGATIVE mg/dL
HGB URINE DIPSTICK: NEGATIVE
KETONES UR: NEGATIVE mg/dL
NITRITE: NEGATIVE
PH: 5.5 (ref 5.0–8.0)
Protein, ur: NEGATIVE mg/dL
SPECIFIC GRAVITY, URINE: 1.023 (ref 1.005–1.030)
Squamous Epithelial / LPF: NONE SEEN
UROBILINOGEN UA: 0.2 mg/dL (ref 0.0–1.0)

## 2013-06-09 ENCOUNTER — Ambulatory Visit: Payer: 59

## 2013-06-09 LAB — URINE CULTURE
COLONY COUNT: NO GROWTH
Organism ID, Bacteria: NO GROWTH

## 2013-06-12 ENCOUNTER — Encounter: Payer: Self-pay | Admitting: Obstetrics and Gynecology

## 2013-06-13 ENCOUNTER — Ambulatory Visit: Payer: 59 | Admitting: Internal Medicine

## 2013-06-29 ENCOUNTER — Other Ambulatory Visit: Payer: 59

## 2013-06-29 DIAGNOSIS — E785 Hyperlipidemia, unspecified: Secondary | ICD-10-CM

## 2013-06-29 DIAGNOSIS — I10 Essential (primary) hypertension: Secondary | ICD-10-CM

## 2013-06-29 DIAGNOSIS — E079 Disorder of thyroid, unspecified: Secondary | ICD-10-CM

## 2013-06-29 DIAGNOSIS — E119 Type 2 diabetes mellitus without complications: Secondary | ICD-10-CM

## 2013-06-30 LAB — COMPREHENSIVE METABOLIC PANEL
ALBUMIN: 4.2 g/dL (ref 3.6–4.8)
ALT: 13 IU/L (ref 0–32)
AST: 16 IU/L (ref 0–40)
Albumin/Globulin Ratio: 2.2 (ref 1.1–2.5)
Alkaline Phosphatase: 44 IU/L (ref 39–117)
BUN/Creatinine Ratio: 24 (ref 11–26)
BUN: 19 mg/dL (ref 8–27)
CALCIUM: 9.2 mg/dL (ref 8.7–10.3)
CO2: 26 mmol/L (ref 18–29)
CREATININE: 0.8 mg/dL (ref 0.57–1.00)
Chloride: 99 mmol/L (ref 97–108)
GFR calc Af Amer: 90 mL/min/{1.73_m2} (ref >59)
GFR, EST NON AFRICAN AMERICAN: 78 mL/min/{1.73_m2} (ref >59)
GLOBULIN, TOTAL: 1.9 g/dL (ref 1.5–4.5)
Glucose: 103 mg/dL — ABNORMAL HIGH (ref 65–99)
Potassium: 3.4 mmol/L — ABNORMAL LOW (ref 3.5–5.2)
Sodium: 141 mmol/L (ref 134–144)
TOTAL PROTEIN: 6.1 g/dL (ref 6.0–8.5)
Total Bilirubin: 0.6 mg/dL (ref 0.0–1.2)

## 2013-06-30 LAB — LIPID PANEL
CHOLESTEROL TOTAL: 163 mg/dL (ref 100–199)
Chol/HDL Ratio: 3 ratio units (ref 0.0–4.4)
HDL: 55 mg/dL (ref >39)
LDL Calculated: 90 mg/dL (ref 0–99)
Triglycerides: 88 mg/dL (ref 0–149)
VLDL CHOLESTEROL CAL: 18 mg/dL (ref 5–40)

## 2013-06-30 LAB — HEMOGLOBIN A1C
Est. average glucose Bld gHb Est-mCnc: 126 mg/dL
Hgb A1c MFr Bld: 6 % — ABNORMAL HIGH (ref 4.8–5.6)

## 2013-06-30 LAB — TSH: TSH: 2.21 u[IU]/mL (ref 0.450–4.500)

## 2013-07-04 ENCOUNTER — Ambulatory Visit (INDEPENDENT_AMBULATORY_CARE_PROVIDER_SITE_OTHER): Payer: 59 | Admitting: Internal Medicine

## 2013-07-04 VITALS — BP 134/88 | HR 69 | Resp 10 | Wt 163.0 lb

## 2013-07-04 DIAGNOSIS — I10 Essential (primary) hypertension: Secondary | ICD-10-CM

## 2013-07-04 DIAGNOSIS — E039 Hypothyroidism, unspecified: Secondary | ICD-10-CM

## 2013-07-04 DIAGNOSIS — E119 Type 2 diabetes mellitus without complications: Secondary | ICD-10-CM

## 2013-07-04 DIAGNOSIS — E785 Hyperlipidemia, unspecified: Secondary | ICD-10-CM

## 2013-07-04 NOTE — Progress Notes (Signed)
Patient ID: Kelly Fletcher, female   DOB: [birth year], 63 y.o.   MRN: [id number]    Location:    [facility]  Place of Service:  OFFICE    Allergies  Allergen Reactions  . Latex   . Naproxen   . Nitrofurantoin Monohyd Macro   . Paroxetine Hcl   . Prednisone   . Prozac [Fluoxetine Hcl]     Chief Complaint  Patient presents with  . Medical Managment of Chronic Issues    6 month follow-up, discuss labs (copy printed)   . Fatigue    Patient c/o extreme fatigue   . Medication Management    Discuss New RX for Nexium     HPI:  Unspecified hypothyroidism - compensated  Type II or unspecified type diabetes mellitus without mention of complication, not stated as uncontrolled : controlled  Other and unspecified hyperlipidemia: controlled  HYPERTENSION;controlled    Medications: Patient's Medications  New Prescriptions   No medications on file  Previous Medications   ATORVASTATIN (LIPITOR) 10 MG TABLET    One daily to control cholesterol   CHOLECALCIFEROL (VITAMIN D) 1000 UNITS TABLET    Take 1,000 Units by mouth daily.   CO-ENZYME Q-10 30 MG CAPSULE    Take 30 mg by mouth 3 (three) times daily.   ESOMEPRAZOLE (NEXIUM) 40 MG CAPSULE    Take 40 mg by mouth daily at 12 noon.   FISH OIL-OMEGA-3 FATTY ACIDS 1000 MG CAPSULE    Take 2 g by mouth daily.   HYDROCHLOROTHIAZIDE (HYDRODIURIL) 25 MG TABLET    Take one tablet once a day to control swelling/blood pressure   LEVOTHYROXINE (SYNTHROID, LEVOTHROID) 125 MCG TABLET    Take 1 tablet (125 mcg total) by mouth daily.   LORAZEPAM (ATIVAN) 1 MG TABLET    Take one tablet up to three times daily as needed for anxiety   METFORMIN (GLUCOPHAGE) 500 MG TABLET    Take 500 mg by mouth 2 (two) times daily with a meal.   RESVERATROL PO    Take by mouth. WITH D   Modified Medications   No medications on file  Discontinued Medications   No medications on file     Review of Systems  Constitutional: Positive for fatigue. Negative for fever, chills,  activity change and appetite change.  HENT: Negative.   Eyes: Negative.   Respiratory: Negative.   Cardiovascular: Negative.   Gastrointestinal: Negative.   Endocrine: Negative.   Genitourinary: Negative.   Musculoskeletal: Negative.   Skin: Negative.   Allergic/Immunologic: Negative.   Neurological: Negative.   Hematological: Negative.   Psychiatric/Behavioral:       Chronic stress    Filed Vitals:   07/04/13 1407  BP: 134/88  Pulse: 69  Resp: 10  Weight: 163 lb (73.936 kg)  SpO2: 97%   Physical Exam  Constitutional: She is oriented to person, place, and time. She appears well-developed and well-nourished. No distress.  HENT:  Nose: Nose normal.  Mouth/Throat: No oropharyngeal exudate.  Eyes: Conjunctivae and EOM are normal. Pupils are equal, round, and reactive to light. Left eye exhibits no discharge.  Neck: No JVD present. No tracheal deviation present. No thyromegaly present.  Cardiovascular: Normal rate, regular rhythm, normal heart sounds and intact distal pulses.  Exam reveals no gallop and no friction rub.   No murmur heard. Pulmonary/Chest: No respiratory distress. She has no wheezes. She has no rales.  Abdominal: She exhibits no distension and no mass. There is no tenderness.  Musculoskeletal: Normal range  of motion. She exhibits no edema and no tenderness.  Lymphadenopathy:    She has no cervical adenopathy.  Neurological: She is alert and oriented to person, place, and time. She has normal reflexes. No cranial nerve deficit. Coordination normal.  Skin: No rash noted. No erythema. No pallor.  Psychiatric: She has a normal mood and affect. Her behavior is normal. Judgment and thought content normal.     Labs reviewed: Appointment on 06/29/2013  Component Date Value Range Status  . Glucose 06/29/2013 103* 65 - 99 mg/dL Final  . BUN 06/29/2013 19  8 - 27 mg/dL Final  . Creatinine, Ser 06/29/2013 0.80  0.57 - 1.00 mg/dL Final  . GFR calc non Af Amer  06/29/2013 78  >59 mL/min/1.73 Final  . GFR calc Af Amer 06/29/2013 90  >59 mL/min/1.73 Final  . BUN/Creatinine Ratio 06/29/2013 24  11 - 26 Final  . Sodium 06/29/2013 141  134 - 144 mmol/L Final  . Potassium 06/29/2013 3.4* 3.5 - 5.2 mmol/L Final  . Chloride 06/29/2013 99  97 - 108 mmol/L Final  . CO2 06/29/2013 26  18 - 29 mmol/L Final  . Calcium 06/29/2013 9.2  8.7 - 10.3 mg/dL Final                 **Please note reference interval change**  . Total Protein 06/29/2013 6.1  6.0 - 8.5 g/dL Final  . Albumin 06/29/2013 4.2  3.6 - 4.8 g/dL Final  . Globulin, Total 06/29/2013 1.9  1.5 - 4.5 g/dL Final  . Albumin/Globulin Ratio 06/29/2013 2.2  1.1 - 2.5 Final  . Total Bilirubin 06/29/2013 0.6  0.0 - 1.2 mg/dL Final  . Alkaline Phosphatase 06/29/2013 44  39 - 117 IU/L Final  . AST 06/29/2013 16  0 - 40 IU/L Final  . ALT 06/29/2013 13  0 - 32 IU/L Final  . Hemoglobin A1C 06/29/2013 6.0* 4.8 - 5.6 % Final   Comment:          Increased risk for diabetes: 5.7 - 6.4                                   Diabetes: >6.4                                   Glycemic control for adults with diabetes: <7.0  . Estimated average glucose 06/29/2013 126   Final  . TSH 06/29/2013 2.210  0.450 - 4.500 uIU/mL Final  . Cholesterol, Total 06/29/2013 163  100 - 199 mg/dL Final  . Triglycerides 06/29/2013 88  0 - 149 mg/dL Final  . HDL 06/29/2013 55  >39 mg/dL Final   Comment: According to ATP-III Guidelines, HDL-C >59 mg/dL is considered a                          negative risk factor for CHD.  Marland Kitchen VLDL Cholesterol Cal 06/29/2013 18  5 - 40 mg/dL Final  . LDL Calculated 06/29/2013 90  0 - 99 mg/dL Final  . Chol/HDL Ratio 06/29/2013 3.0  0.0 - 4.4 ratio units Final   Comment:                                   T. Chol/HDL  Ratio                                                                      Men  Women                                                        1/2 Avg.Risk  3.4    3.3                                                             Avg.Risk  5.0    4.4                                                         2X Avg.Risk  9.6    7.1                                                         3X Avg.Risk 23.4   11.0  Office Visit on 06/07/2013  Component Date Value Range Status  . Color, Urine 06/07/2013 YELLOW  YELLOW Final  . APPearance 06/07/2013 CLEAR  CLEAR Final  . Specific Gravity, Urine 06/07/2013 1.023  1.005 - 1.030 Final  . pH 06/07/2013 5.5  5.0 - 8.0 Final  . Glucose, UA 06/07/2013 NEG  NEG mg/dL Final  . Bilirubin Urine 06/07/2013 NEG  NEG Final  . Ketones, ur 06/07/2013 NEG  NEG mg/dL Final  . Hgb urine dipstick 06/07/2013 NEG  NEG Final  . Protein, ur 06/07/2013 NEG  NEG mg/dL Final  . Urobilinogen, UA 06/07/2013 0.2  0.0 - 1.0 mg/dL Final  . Nitrite 06/07/2013 NEG  NEG Final  . Leukocytes, UA 06/07/2013 TRACE* NEG Final  . Squamous Epithelial / LPF 06/07/2013 NONE SEEN  RARE Final  . Crystals 06/07/2013 NONE SEEN  NONE SEEN Final  . Casts 06/07/2013 NONE SEEN  NONE SEEN Final  . WBC, UA 06/07/2013 0-2  <3 WBC/hpf Final  . RBC / HPF 06/07/2013 0-2  <3 RBC/hpf Final  . Bacteria, UA 06/07/2013 NONE SEEN  RARE Final  . Colony Count 06/07/2013 NO GROWTH   Final  . Organism ID, Bacteria 06/07/2013 NO GROWTH   Final      Assessment/Plan  Unspecified hypothyroidism -continue current medication. Plan: TSH  Type II or unspecified type diabetes mellitus without mention of complication, not stated as uncontrolled - Plan: Hemoglobin A1c, BMP with eGFR  Other and unspecified hyperlipidemia: controlled  HYPERTENSION: controlled

## 2013-07-04 NOTE — Patient Instructions (Signed)
Continue current medications. 

## 2013-07-09 ENCOUNTER — Other Ambulatory Visit: Payer: Self-pay | Admitting: Internal Medicine

## 2013-08-04 ENCOUNTER — Other Ambulatory Visit: Payer: Self-pay | Admitting: Internal Medicine

## 2013-08-24 ENCOUNTER — Ambulatory Visit: Payer: 59 | Admitting: Gynecology

## 2013-08-25 ENCOUNTER — Other Ambulatory Visit: Payer: Self-pay | Admitting: Internal Medicine

## 2013-08-30 ENCOUNTER — Other Ambulatory Visit: Payer: Self-pay | Admitting: Internal Medicine

## 2013-09-12 ENCOUNTER — Encounter: Payer: Self-pay | Admitting: Gynecology

## 2013-09-12 ENCOUNTER — Ambulatory Visit (INDEPENDENT_AMBULATORY_CARE_PROVIDER_SITE_OTHER): Payer: 59 | Admitting: Gynecology

## 2013-09-12 DIAGNOSIS — N760 Acute vaginitis: Secondary | ICD-10-CM

## 2013-09-12 DIAGNOSIS — N814 Uterovaginal prolapse, unspecified: Secondary | ICD-10-CM

## 2013-09-12 DIAGNOSIS — N816 Rectocele: Secondary | ICD-10-CM

## 2013-09-12 DIAGNOSIS — R350 Frequency of micturition: Secondary | ICD-10-CM

## 2013-09-12 DIAGNOSIS — B9689 Other specified bacterial agents as the cause of diseases classified elsewhere: Secondary | ICD-10-CM

## 2013-09-12 DIAGNOSIS — A499 Bacterial infection, unspecified: Secondary | ICD-10-CM

## 2013-09-12 DIAGNOSIS — N8111 Cystocele, midline: Secondary | ICD-10-CM

## 2013-09-12 LAB — WET PREP FOR TRICH, YEAST, CLUE
Clue Cells Wet Prep HPF POC: NONE SEEN
TRICH WET PREP: NONE SEEN
YEAST WET PREP: NONE SEEN

## 2013-09-12 LAB — URINALYSIS W MICROSCOPIC + REFLEX CULTURE
Bilirubin Urine: NEGATIVE
GLUCOSE, UA: NEGATIVE mg/dL
Hgb urine dipstick: NEGATIVE
KETONES UR: NEGATIVE mg/dL
Leukocytes, UA: NEGATIVE
Nitrite: NEGATIVE
Protein, ur: NEGATIVE mg/dL
Specific Gravity, Urine: 1.015 (ref 1.005–1.030)
Urobilinogen, UA: 0.2 mg/dL (ref 0.0–1.0)
pH: 5.5 (ref 5.0–8.0)

## 2013-09-12 MED ORDER — METRONIDAZOLE 500 MG PO TABS: 500.0000 mg | ORAL_TABLET | Freq: Two times a day (BID) | ORAL | Status: DC

## 2013-09-12 NOTE — Patient Instructions (Signed)
Take Flagyl (metronidazole) antibiotic twice daily for 7 days. Followup if your symptoms persist, worsen or recur. Avoid alcohol taking the medication.  Metronidazole tablets or capsules What is this medicine? METRONIDAZOLE (me troe NI da zole) is an antiinfective. It is used to treat certain kinds of bacterial and protozoal infections. It will not work for colds, flu, or other viral infections. This medicine may be used for other purposes; ask your health care provider or pharmacist if you have questions. COMMON BRAND NAME(S): Flagyl What should I tell my health care provider before I take this medicine? They need to know if you have any of these conditions: -anemia or other blood disorders -disease of the nervous system -fungal or yeast infection -if you drink alcohol containing drinks -liver disease -seizures -an unusual or allergic reaction to metronidazole, or other medicines, foods, dyes, or preservatives -pregnant or trying to get pregnant -breast-feeding How should I use this medicine? Take this medicine by mouth with a full glass of water. Follow the directions on the prescription label. Take your medicine at regular intervals. Do not take your medicine more often than directed. Take all of your medicine as directed even if you think you are better. Do not skip doses or stop your medicine early. Talk to your pediatrician regarding the use of this medicine in children. Special care may be needed. Overdosage: If you think you have taken too much of this medicine contact a poison control center or emergency room at once. NOTE: This medicine is only for you. Do not share this medicine with others. What if I miss a dose? If you miss a dose, take it as soon as you can. If it is almost time for your next dose, take only that dose. Do not take double or extra doses. What may interact with this medicine? Do not take this medicine with any of the following medications: -alcohol or any  product that contains alcohol -amprenavir oral solution -cisapride -disulfiram -dofetilide -dronedarone -paclitaxel injection -pimozide -ritonavir oral solution -sertraline oral solution -sulfamethoxazole-trimethoprim injection -thioridazine -ziprasidone This medicine may also interact with the following medications: -cimetidine -lithium -other medicines that prolong the QT interval (cause an abnormal heart rhythm) -phenobarbital -phenytoin -warfarin This list may not describe all possible interactions. Give your health care provider a list of all the medicines, herbs, non-prescription drugs, or dietary supplements you use. Also tell them if you smoke, drink alcohol, or use illegal drugs. Some items may interact with your medicine. What should I watch for while using this medicine? Tell your doctor or health care professional if your symptoms do not improve or if they get worse. You may get drowsy or dizzy. Do not drive, use machinery, or do anything that needs mental alertness until you know how this medicine affects you. Do not stand or sit up quickly, especially if you are an older patient. This reduces the risk of dizzy or fainting spells. Avoid alcoholic drinks while you are taking this medicine and for three days afterward. Alcohol may make you feel dizzy, sick, or flushed. If you are being treated for a sexually transmitted disease, avoid sexual contact until you have finished your treatment. Your sexual partner may also need treatment. What side effects may I notice from receiving this medicine? Side effects that you should report to your doctor or health care professional as soon as possible: -allergic reactions like skin rash or hives, swelling of the face, lips, or tongue -confusion, clumsiness -difficulty speaking -discolored or sore mouth -dizziness -fever,  infection -numbness, tingling, pain or weakness in the hands or feet -trouble passing urine or change in the  amount of urine -redness, blistering, peeling or loosening of the skin, including inside the mouth -seizures -unusually weak or tired -vaginal irritation, dryness, or discharge Side effects that usually do not require medical attention (report to your doctor or health care professional if they continue or are bothersome): -diarrhea -headache -irritability -metallic taste -nausea -stomach pain or cramps -trouble sleeping This list may not describe all possible side effects. Call your doctor for medical advice about side effects. You may report side effects to FDA at 1-800-FDA-1088. Where should I keep my medicine? Keep out of the reach of children. Store at room temperature below 25 degrees C (77 degrees F). Protect from light. Keep container tightly closed. Throw away any unused medicine after the expiration date. NOTE: This sheet is a summary. It may not cover all possible information. If you have questions about this medicine, talk to your doctor, pharmacist, or health care provider.  2014, [employer]vier/Gold Standard. (2012-11-15 14:08:26)

## 2013-09-12 NOTE — Progress Notes (Signed)
Kelly Fletcher 1950/07/09 [id number]        63 y.o.  G1P1001 presents with a one-month history of intermittent urinary odor and some urinary frequency. Also vague suprapubic discomfort that comes and goes. No changes in urgency or significant dysuria. She does have a chronic history with some urgency symptoms and has been followed with cystocele/uterine prolapse/rectocele. Has been doing a lot of lifting over the last month or so. No blowback pain fever chills nausea vomiting or other constitutional symptoms.  Past medical history,surgical history, problem list, medications, allergies, family history and social history were all reviewed and documented in the EPIC chart.  Exam: [name] assistant General appearance  Normal Spine straight without CVA tenderness. Abdomen soft nontender without masses guarding rebound organomegaly. Pelvic external BUS vagina with second to third degree cystocele. First to second degree uterine prolapse. First to second-degree rectocele. Scant discharge. Cervix grossly normal. Uterus grossly normal size midline mobile nontender. Adnexa without masses or tenderness.  Assessment/Plan:  63 y.o. G1P1001  1. Intermittent symptoms as noted above. Urinalysis is negative. We'll check culture to make sure. Wet prep does show evidence of low grade bacterial vaginosis. Will cover with Flagyl 500 mg twice a day x7 days, alcohol avoidance reviewed. Followup if symptoms persist, worsen or recur. 2. Cystocele rectocele uterine prolapse. I again reviewed options to include surgery, pessary, observation. I showed her several different pessaries. She is not interested in doing anything at this time. Followup when due for her annual exam next January or sooner if wants to rediscuss options.   Note: This document was prepared with digital dictation and possible smart phrase technology. Any transcriptional errors that result from this process are unintentional.   Anastasio Auerbach MD, 2:53 PM  09/12/2013

## 2013-09-14 ENCOUNTER — Other Ambulatory Visit: Payer: Self-pay | Admitting: Gynecology

## 2013-09-14 LAB — URINE CULTURE: Colony Count: 40000

## 2013-09-14 MED ORDER — SULFAMETHOXAZOLE-TMP DS 800-160 MG PO TABS: 1.0000 | ORAL_TABLET | Freq: Two times a day (BID) | ORAL | Status: DC

## 2013-10-16 ENCOUNTER — Other Ambulatory Visit: Payer: Self-pay | Admitting: Internal Medicine

## 2013-10-16 ENCOUNTER — Telehealth: Payer: Self-pay

## 2013-10-16 ENCOUNTER — Encounter: Payer: Self-pay | Admitting: Internal Medicine

## 2013-10-16 DIAGNOSIS — B001 Herpesviral vesicular dermatitis: Secondary | ICD-10-CM

## 2013-10-16 MED ORDER — VALACYCLOVIR HCL 1 G PO TABS: ORAL_TABLET | ORAL | Status: DC

## 2013-10-16 NOTE — Telephone Encounter (Signed)
Message left on triage VM: patient states she needs rx for generic Valtrex for a fever blister, onset this am. Patient states this is a common concern patient needs rx for #30 sent to [place]  I reviewed patient's problem list and no diagnosis of fever blisters, Dr.Green please advise on patient's request

## 2013-10-16 NOTE — Telephone Encounter (Signed)
ADVISE PATIENT I SENT IN THE PRESCRIPTION 10/16/13.

## 2013-10-17 NOTE — Telephone Encounter (Signed)
Patient aware rx sent to pharmacy.  

## 2013-10-25 ENCOUNTER — Encounter: Payer: Self-pay | Admitting: Nurse Practitioner

## 2013-10-25 ENCOUNTER — Ambulatory Visit (INDEPENDENT_AMBULATORY_CARE_PROVIDER_SITE_OTHER): Payer: 59 | Admitting: Nurse Practitioner

## 2013-10-25 VITALS — BP 140/90 | HR 59 | Temp 97.7°F | Resp 18 | Ht 69.0 in | Wt 165.6 lb

## 2013-10-25 DIAGNOSIS — K219 Gastro-esophageal reflux disease without esophagitis: Secondary | ICD-10-CM

## 2013-10-25 DIAGNOSIS — R1013 Epigastric pain: Secondary | ICD-10-CM

## 2013-10-25 NOTE — Progress Notes (Signed)
Patient ID: Kelly Fletcher, female   DOB: 1951/02/14, 62 y.o.   MRN: [id number]    Allergies  Allergen Reactions  . Latex   . Naproxen   . Nitrofurantoin Monohyd Macro   . Paroxetine Hcl   . Prednisone   . Prozac [Fluoxetine Hcl]     Chief Complaint  Patient presents with  . Acute Visit    nausea, diarrhea, questions whether its a gall bladder issue, tiredness    HPI: Patient is a 63 y.o. female seen in the office today for evaluation of stomach pain and nausea Hx of GERD and ulcers and thought it was coming from this Went out 4 nights ago and ate after she ate she had diarrhea over night but then it resolved Having an ache/epigastric pain that goes into back then pain went down into stomach and into abdomen- episodes x 2, last time last night- question gallstones Heating pad helped and pain eased off after 2 hours and was fine No association with food or activity  Review of Systems:  Review of Systems  Constitutional: Negative for fever, chills and malaise/fatigue.  Respiratory: Negative for cough.   Cardiovascular: Negative for chest pain.  Gastrointestinal: Positive for heartburn, nausea and abdominal pain. Negative for diarrhea, constipation and blood in stool.  Genitourinary: Negative for dysuria, urgency and frequency.  Musculoskeletal: Positive for back pain (high in back when episodes occur).  Skin: Negative for itching and rash.  Neurological: Negative for dizziness, weakness and headaches.     Past Medical History  Diagnosis Date  . Uterine prolapse   . Cystocele   . GERD (gastroesophageal reflux disease)   . Elevated cholesterol   . Rectocele   . Hypertension   . Arthritis   . Type II or unspecified type diabetes mellitus without mention of complication, uncontrolled     Type 2  . Urinary incontinence     Urgency  . Anxiety state, unspecified   . Palpitations   . Allergic rhinitis, cause unspecified   . Other and unspecified hyperlipidemia   .  Unspecified hypothyroidism   . Edema   . Insomnia, unspecified   . Depressive disorder, not elsewhere classified   . Unspecified urinary incontinence   . Peptic ulcer, unspecified site, unspecified as acute or chronic, without mention of hemorrhage, perforation, or obstruction   . Calculus of kidney   . Disturbance of skin sensation    Past Surgical History  Procedure Laterality Date  . Tonsillectomy and adenoidectomy  1963  . Dilation and curettage of uterus      3-05  . Hysteroscopy      3-05  . Endometrial biopsy      Dr.Gottsegen    Social History:   reports that she has never smoked. She has never used smokeless tobacco. She reports that she does not drink alcohol or use illicit drugs.  Family History  Problem Relation Age of Onset  . Hypertension Mother   . Breast cancer Mother 19  . Heart disease Father   . Stroke Father   . Hypertension Sister   . Heart disease Sister   . Osteoporosis Sister   . Diabetes Brother   . Hypertension Brother   . Heart disease Brother   . Other Brother     BRAIN TUMOR  . Cancer Brother     Eye cancer-Melanoma  . Diabetes Brother   . Heart disease Brother   . Heart attack Sister   . Heart disease Brother   .  Heart attack Brother     Medications: Patient's Medications  New Prescriptions   No medications on file  Previous Medications   ATORVASTATIN (LIPITOR) 10 MG TABLET    One daily to control cholesterol   CHOLECALCIFEROL (VITAMIN D) 1000 UNITS TABLET    Take 1,000 Units by mouth daily.   CO-ENZYME Q-10 30 MG CAPSULE    Take 30 mg by mouth 3 (three) times daily.   ESOMEPRAZOLE (NEXIUM) 40 MG CAPSULE    Take 22 mg by mouth daily at 12 noon.    FISH OIL-OMEGA-3 FATTY ACIDS 1000 MG CAPSULE    Take 2 g by mouth daily.   HYDROCHLOROTHIAZIDE (HYDRODIURIL) 25 MG TABLET    Take one tablet once a day to control swelling/blood pressure   LEVOTHYROXINE (SYNTHROID, LEVOTHROID) 125 MCG TABLET    Take 1 tablet (125 mcg total) by mouth  daily.   LORAZEPAM (ATIVAN) 1 MG TABLET    Take one tablet up to three times daily as needed for anxiety   METFORMIN (GLUCOPHAGE-XR) 500 MG 24 HR TABLET    TAKE 1 TABLET TWICE DAILY TO CONTROL BLOOD SUGAR   ONE TOUCH ULTRA TEST TEST STRIP    CHECK BLOOD GLUCOSE AS DIRECTED   RESVERATROL PO    Take by mouth. WITH D    VALACYCLOVIR (VALTREX) 1000 MG TABLET    One twice daily to help resolve viral blisters  Modified Medications   No medications on file  Discontinued Medications   METRONIDAZOLE (FLAGYL) 500 MG TABLET    Take 1 tablet (500 mg total) by mouth 2 (two) times daily. For 7 days.  Avoid alcohol while taking   SULFAMETHOXAZOLE-TRIMETHOPRIM (BACTRIM DS) 800-160 MG PER TABLET    Take 1 tablet by mouth 2 (two) times daily.     Physical Exam:  Filed Vitals:   10/25/13 1125  BP: 140/90  Pulse: 59  Temp: 97.7 F (36.5 C)  TempSrc: Oral  Resp: 18  Height: 5\' 9"  (1.753 m)  Weight: 165 lb 9.6 oz (75.116 kg)  SpO2: 99%   Physical Exam  Constitutional: She is oriented to person, place, and time and well-developed, well-nourished, and in no distress.  HENT:  Mouth/Throat: Oropharynx is clear and moist. No oropharyngeal exudate.  Eyes: Conjunctivae and EOM are normal. Pupils are equal, round, and reactive to light.  Neck: Normal range of motion. Neck supple. No thyromegaly present.  Cardiovascular: Normal rate, regular rhythm and normal heart sounds.   Pulmonary/Chest: Effort normal and breath sounds normal. No respiratory distress.  Abdominal: Soft. Bowel sounds are normal. She exhibits no distension and no mass. There is no tenderness. There is no rebound and no guarding.  Neurological: She is alert and oriented to person, place, and time.  Skin: Skin is warm and dry.  Psychiatric: Affect normal.     Labs reviewed: Basic Metabolic Panel:  Recent Labs  12/09/12 0950 06/29/13 0904  NA 142 141  K 3.9 3.4*  CL 102 99  CO2 25 26  GLUCOSE 100* 103*  BUN 17 19  CREATININE  0.78 0.80  CALCIUM 9.3 9.2  TSH  --  2.210   Liver Function Tests:  Recent Labs  12/09/12 0950 06/29/13 0904  AST 14 16  ALT 11 13  ALKPHOS 47 44  BILITOT 0.5 0.6  PROT 6.2 6.1   No results found for this basename: LIPASE, AMYLASE,  in the last 8760 hours No results found for this basename: AMMONIA,  in the last 8760 hours  CBC: No results found for this basename: WBC, NEUTROABS, HGB, HCT, MCV, PLT,  in the last 8760 hours Lipid Panel:  Recent Labs  12/09/12 0950 06/29/13 0904  HDL 57 55  LDLCALC 80 90  TRIG 52 88  CHOLHDL 2.6 3.0   TSH:  Recent Labs  06/29/13 0904  TSH 2.210   A1C: Lab Results  Component Value Date   HGBA1C 6.0* 10/25/2013     Assessment/Plan 1. Abdominal pain, epigastric -may take tylenol 650 mg q 6 hours as needed for pain - Comprehensive metabolic panel - Lipase - Amylase - US Abdomen Complete; Future - CBC With differential/Platelet  2. GERD (gastroesophageal reflux disease) -samples of nexium 40 mg to see if this helps GERD and may help epigastric symptoms   -return precautions given, to keep follow up with Dr Nyoka Cowden next week

## 2013-10-25 NOTE — Patient Instructions (Signed)
Samples given of nexium 40 mg for GERD  Will get blood work today and ultrasound of abdomen

## 2013-10-26 ENCOUNTER — Ambulatory Visit
Admission: RE | Admit: 2013-10-26 | Discharge: 2013-10-26 | Disposition: A | Discharge status: Home / Self Care | Payer: 59 | Source: Ambulatory Visit | Attending: Nurse Practitioner | Admitting: Nurse Practitioner

## 2013-10-26 DIAGNOSIS — R1013 Epigastric pain: Secondary | ICD-10-CM

## 2013-10-26 LAB — CBC WITH DIFFERENTIAL
BASOS: 0 %
Basophils Absolute: 0 10*3/uL (ref 0.0–0.2)
EOS: 7 %
Eosinophils Absolute: 0.6 10*3/uL — ABNORMAL HIGH (ref 0.0–0.4)
HCT: 42 % (ref 34.0–46.6)
Hemoglobin: 13.8 g/dL (ref 11.1–15.9)
IMMATURE GRANULOCYTES: 0 %
Immature Grans (Abs): 0 10*3/uL (ref 0.0–0.1)
LYMPHS ABS: 3 10*3/uL (ref 0.7–3.1)
Lymphs: 34 %
MCH: 31.1 pg (ref 26.6–33.0)
MCHC: 32.9 g/dL (ref 31.5–35.7)
MCV: 95 fL (ref 79–97)
MONOS ABS: 0.5 10*3/uL (ref 0.1–0.9)
Monocytes: 5 %
NEUTROS PCT: 54 %
Neutrophils Absolute: 4.7 10*3/uL (ref 1.4–7.0)
PLATELETS: 252 10*3/uL (ref 150–379)
RBC: 4.44 x10E6/uL (ref 3.77–5.28)
RDW: 12.6 % (ref 12.3–15.4)
WBC: 8.7 10*3/uL (ref 3.4–10.8)

## 2013-10-26 LAB — COMPREHENSIVE METABOLIC PANEL
ALK PHOS: 52 IU/L (ref 39–117)
ALT: 19 IU/L (ref 0–32)
AST: 17 IU/L (ref 0–40)
Albumin/Globulin Ratio: 1.8 (ref 1.1–2.5)
Albumin: 3.9 g/dL (ref 3.6–4.8)
BILIRUBIN TOTAL: 0.6 mg/dL (ref 0.0–1.2)
BUN / CREAT RATIO: 20 (ref 11–26)
BUN: 16 mg/dL (ref 8–27)
CHLORIDE: 102 mmol/L (ref 97–108)
CO2: 27 mmol/L (ref 18–29)
Calcium: 9.3 mg/dL (ref 8.7–10.3)
Creatinine, Ser: 0.8 mg/dL (ref 0.57–1.00)
GFR calc non Af Amer: 78 mL/min/{1.73_m2} (ref >59)
GFR, EST AFRICAN AMERICAN: 90 mL/min/{1.73_m2} (ref >59)
Globulin, Total: 2.2 g/dL (ref 1.5–4.5)
Glucose: 104 mg/dL — ABNORMAL HIGH (ref 65–99)
Potassium: 3.9 mmol/L (ref 3.5–5.2)
Sodium: 144 mmol/L (ref 134–144)
Total Protein: 6.1 g/dL (ref 6.0–8.5)

## 2013-10-26 LAB — LIPASE: LIPASE: 34 U/L (ref 0–59)

## 2013-10-26 LAB — AMYLASE: Amylase: 51 U/L (ref 31–124)

## 2013-10-26 LAB — HEMOGLOBIN A1C
Est. average glucose Bld gHb Est-mCnc: 126 mg/dL
Hgb A1c MFr Bld: 6 % — ABNORMAL HIGH (ref 4.8–5.6)

## 2013-10-26 LAB — TSH: TSH: 2.63 u[IU]/mL (ref 0.450–4.500)

## 2013-10-30 ENCOUNTER — Other Ambulatory Visit: Payer: 59

## 2013-10-30 ENCOUNTER — Other Ambulatory Visit: Payer: Self-pay | Admitting: Internal Medicine

## 2013-11-01 ENCOUNTER — Encounter: Payer: Self-pay | Admitting: Internal Medicine

## 2013-11-01 ENCOUNTER — Ambulatory Visit (INDEPENDENT_AMBULATORY_CARE_PROVIDER_SITE_OTHER): Payer: 59 | Admitting: Internal Medicine

## 2013-11-01 VITALS — BP 130/80 | HR 66 | Temp 98.7°F | Resp 18 | Ht 69.0 in | Wt 163.8 lb

## 2013-11-01 DIAGNOSIS — K449 Diaphragmatic hernia without obstruction or gangrene: Secondary | ICD-10-CM

## 2013-11-01 DIAGNOSIS — K219 Gastro-esophageal reflux disease without esophagitis: Secondary | ICD-10-CM

## 2013-11-01 DIAGNOSIS — F411 Generalized anxiety disorder: Secondary | ICD-10-CM

## 2013-11-01 DIAGNOSIS — E119 Type 2 diabetes mellitus without complications: Secondary | ICD-10-CM

## 2013-11-01 DIAGNOSIS — R1013 Epigastric pain: Secondary | ICD-10-CM | POA: Insufficient documentation

## 2013-11-01 DIAGNOSIS — E785 Hyperlipidemia, unspecified: Secondary | ICD-10-CM

## 2013-11-01 DIAGNOSIS — I10 Essential (primary) hypertension: Secondary | ICD-10-CM

## 2013-11-01 DIAGNOSIS — E039 Hypothyroidism, unspecified: Secondary | ICD-10-CM

## 2013-11-01 MED ORDER — ESOMEPRAZOLE MAGNESIUM 40 MG PO CPDR: DELAYED_RELEASE_CAPSULE | ORAL | Status: DC

## 2013-11-01 NOTE — Patient Instructions (Signed)
Continue current medications. 

## 2013-11-01 NOTE — Progress Notes (Signed)
Patient ID: Kelly Fletcher, female   DOB: Jun 04, 1950, 63 y.o.   MRN: [id number]    Location:    [facility]  Place of Service:  OFFICE    Allergies  Allergen Reactions  . Latex   . Naproxen   . Nitrofurantoin Monohyd Macro   . Paroxetine Hcl   . Prednisone   . Prozac [Fluoxetine Hcl]     Chief Complaint  Patient presents with  . Follow-up    HPI:  Nexium helped the way her stomach feels. Also using lorazepam to see if it helps control her stress from raisng her granddaughter. Continues to feel a little gnawing in the epigastrium. Hurts all the way into her back. Using GAS-X for bloating especially at night. Started with nausea and diarrhea. Nausea is better. Eating does not seem to help and may make it worse. Does OK with bland foods like potatoes, cracker, etc. Weight remains stable.  HYPERTENSION - controlled  Type II or unspecified type diabetes mellitus without mention of complication, not stated as uncontrolled - controlled  Unspecified hypothyroidism - controlled  Other and unspecified hyperlipidemia - controlled  Anxiety state, unspecified: unchanged   Medications: Patient's Medications  New Prescriptions   No medications on file  Previous Medications   ATORVASTATIN (LIPITOR) 10 MG TABLET    One daily to control cholesterol   CHOLECALCIFEROL (VITAMIN D) 1000 UNITS TABLET    Take 1,000 Units by mouth daily.   CO-ENZYME Q-10 30 MG CAPSULE    Take 30 mg by mouth 3 (three) times daily.   ESOMEPRAZOLE (NEXIUM) 40 MG CAPSULE    Take 22 mg by mouth daily at 12 noon.    FISH OIL-OMEGA-3 FATTY ACIDS 1000 MG CAPSULE    Take 2 g by mouth daily.   HYDROCHLOROTHIAZIDE (HYDRODIURIL) 25 MG TABLET    Take one tablet once a day to control swelling/blood pressure   LEVOTHYROXINE (SYNTHROID, LEVOTHROID) 125 MCG TABLET    TAKE ONE TABLET BY MOUTH ONCE DAILY   LORAZEPAM (ATIVAN) 1 MG TABLET    Take one tablet up to three times daily as needed for anxiety   METFORMIN (GLUCOPHAGE-XR) 500 MG  24 HR TABLET    TAKE 1 TABLET TWICE DAILY TO CONTROL BLOOD SUGAR   ONE TOUCH ULTRA TEST TEST STRIP    CHECK BLOOD GLUCOSE AS DIRECTED   RESVERATROL PO    Take by mouth. WITH D    VALACYCLOVIR (VALTREX) 1000 MG TABLET    One twice daily to help resolve viral blisters  Modified Medications   No medications on file  Discontinued Medications   No medications on file     Review of Systems  Constitutional: Positive for fatigue. Negative for fever, chills, activity change and appetite change.  HENT: Negative.   Eyes: Negative.   Respiratory: Negative.   Cardiovascular: Negative.   Gastrointestinal:       Epigastric discomfort  Endocrine: Negative.   Genitourinary: Negative.   Musculoskeletal: Negative.   Skin: Negative.   Allergic/Immunologic: Negative.   Neurological: Negative.   Hematological: Negative.   Psychiatric/Behavioral:       Chronic stress    Filed Vitals:   11/01/13 1356  BP: 130/80  Pulse: 66  Temp: 98.7 F (37.1 C)  TempSrc: Oral  Resp: 18  Height: 5\' 9"  (1.753 m)  Weight: 163 lb 12.8 oz (74.299 kg)  SpO2: 98%   Body mass index is 24.18 kg/(m^2).  Physical Exam  Constitutional: She is oriented to person, place, and  time. She appears well-developed and well-nourished. No distress.  HENT:  Nose: Nose normal.  Mouth/Throat: No oropharyngeal exudate.  Eyes: Conjunctivae and EOM are normal. Pupils are equal, round, and reactive to light. Left eye exhibits no discharge.  Neck: No JVD present. No tracheal deviation present. No thyromegaly present.  Cardiovascular: Normal rate, regular rhythm, normal heart sounds and intact distal pulses.  Exam reveals no gallop and no friction rub.   No murmur heard. Pulmonary/Chest: No respiratory distress. She has no wheezes. She has no rales.  Abdominal: She exhibits no distension and no mass. There is no tenderness.  Musculoskeletal: Normal range of motion. She exhibits no edema and no tenderness.  Lymphadenopathy:    She  has no cervical adenopathy.  Neurological: She is alert and oriented to person, place, and time. She has normal reflexes. No cranial nerve deficit. Coordination normal.  Skin: No rash noted. No erythema. No pallor.  Psychiatric: She has a normal mood and affect. Her behavior is normal. Judgment and thought content normal.     Labs reviewed: Appointment on 10/30/2013  Component Date Value Ref Range Status  . Hemoglobin A1C 10/25/2013 6.0* 4.8 - 5.6 % Final   Comment:          Increased risk for diabetes: 5.7 - 6.4                                   Diabetes: >6.4                                   Glycemic control for adults with diabetes: <7.0  . Estimated average glucose 10/25/2013 126   Final  . TSH 10/25/2013 2.630  0.450 - 4.500 uIU/mL Final  Office Visit on 10/25/2013  Component Date Value Ref Range Status  . Glucose 10/25/2013 104* 65 - 99 mg/dL Final  . BUN 10/25/2013 16  8 - 27 mg/dL Final  . Creatinine, Ser 10/25/2013 0.80  0.57 - 1.00 mg/dL Final  . GFR calc non Af Amer 10/25/2013 78  >59 mL/min/1.73 Final  . GFR calc Af Amer 10/25/2013 90  >59 mL/min/1.73 Final  . BUN/Creatinine Ratio 10/25/2013 20  11 - 26 Final  . Sodium 10/25/2013 144  134 - 144 mmol/L Final  . Potassium 10/25/2013 3.9  3.5 - 5.2 mmol/L Final  . Chloride 10/25/2013 102  97 - 108 mmol/L Final  . CO2 10/25/2013 27  18 - 29 mmol/L Final  . Calcium 10/25/2013 9.3  8.7 - 10.3 mg/dL Final  . Total Protein 10/25/2013 6.1  6.0 - 8.5 g/dL Final  . Albumin 10/25/2013 3.9  3.6 - 4.8 g/dL Final  . Globulin, Total 10/25/2013 2.2  1.5 - 4.5 g/dL Final  . Albumin/Globulin Ratio 10/25/2013 1.8  1.1 - 2.5 Final  . Total Bilirubin 10/25/2013 0.6  0.0 - 1.2 mg/dL Final  . Alkaline Phosphatase 10/25/2013 52  39 - 117 IU/L Final  . AST 10/25/2013 17  0 - 40 IU/L Final  . ALT 10/25/2013 19  0 - 32 IU/L Final  . Lipase 10/25/2013 34  0 - 59 U/L Final  . Amylase 10/25/2013 51  31 - 124 U/L Final  . WBC 10/25/2013 8.7  3.4  - 10.8 x10E3/uL Final  . RBC 10/25/2013 4.44  3.77 - 5.28 x10E6/uL Final  . Hemoglobin 10/25/2013 13.8  11.1 -  15.9 g/dL Final  . HCT 10/25/2013 42.0  34.0 - 46.6 % Final  . MCV 10/25/2013 95  79 - 97 fL Final  . MCH 10/25/2013 31.1  26.6 - 33.0 pg Final  . MCHC 10/25/2013 32.9  31.5 - 35.7 g/dL Final  . RDW 10/25/2013 12.6  12.3 - 15.4 % Final  . Platelets 10/25/2013 252  150 - 379 x10E3/uL Final  . Neutrophils Relative % 10/25/2013 54   Final  . Lymphs 10/25/2013 34   Final  . Monocytes 10/25/2013 5   Final  . Eos 10/25/2013 7   Final  . Basos 10/25/2013 0   Final  . Neutrophils Absolute 10/25/2013 4.7  1.4 - 7.0 x10E3/uL Final  . Lymphocytes Absolute 10/25/2013 3.0  0.7 - 3.1 x10E3/uL Final  . Monocytes Absolute 10/25/2013 0.5  0.1 - 0.9 x10E3/uL Final  . Eosinophils Absolute 10/25/2013 0.6* 0.0 - 0.4 x10E3/uL Final  . Basophils Absolute 10/25/2013 0.0  0.0 - 0.2 x10E3/uL Final  . Immature Granulocytes 10/25/2013 0   Final  . Immature Grans (Abs) 10/25/2013 0.0  0.0 - 0.1 x10E3/uL Final  Office Visit on 09/12/2013  Component Date Value Ref Range Status  . Color, Urine 09/12/2013 YELLOW  YELLOW Final  . APPearance 09/12/2013 CLEAR  CLEAR Final  . Specific Gravity, Urine 09/12/2013 1.015  1.005 - 1.030 Final  . pH 09/12/2013 5.5  5.0 - 8.0 Final  . Glucose, UA 09/12/2013 NEG  NEG mg/dL Final  . Bilirubin Urine 09/12/2013 NEG  NEG Final  . Ketones, ur 09/12/2013 NEG  NEG mg/dL Final  . Hgb urine dipstick 09/12/2013 NEG  NEG Final  . Protein, ur 09/12/2013 NEG  NEG mg/dL Final  . Urobilinogen, UA 09/12/2013 0.2  0.0 - 1.0 mg/dL Final  . Nitrite 09/12/2013 NEG  NEG Final  . Leukocytes, UA 09/12/2013 NEG  NEG Final  . Yeast Wet Prep HPF POC 09/12/2013 NONE SEEN  NONE SEEN Final  . Trich, Wet Prep 09/12/2013 NONE SEEN  NONE SEEN Final  . Clue Cells Wet Prep HPF POC 09/12/2013 NONE SEEN  NONE SEEN Final  . WBC, Wet Prep HPF POC 09/12/2013 MOD* NONE SEEN Final   Comment: MODERATE  BACTERIA SEEN                          (3-6) EPITH. CELLS PER HPF                          AMINE NEGATIVE  . Culture 09/12/2013 ESCHERICHIA COLI   Final  . Colony Count 09/12/2013 40,000 COLONIES/ML   Final  . Organism ID, Bacteria 09/12/2013 ESCHERICHIA COLI   Final      Assessment/Plan  1. Epigastric pain - Ambulatory referral to Gastroenterology  2. HIATAL HERNIA continue Nexium  3. HYPERTENSION controlled - Comprehensive metabolic panel; Future  4. Type II or unspecified type diabetes mellitus without mention of complication, not stated as uncontrolled controlled - Comprehensive metabolic panel; Future - Hemoglobin A1c; Future - Microalbumin / creatinine urine ratio; Future  5. Unspecified hypothyroidism compensated - TSH; Future  6. Other and unspecified hyperlipidemia controlled - Lipid panel; Future  7. Anxiety state, unspecified persists  8. GERD Continue Nexium. See GI.

## 2013-11-02 ENCOUNTER — Telehealth: Payer: Self-pay | Admitting: Gastroenterology

## 2013-11-02 DIAGNOSIS — K219 Gastro-esophageal reflux disease without esophagitis: Secondary | ICD-10-CM

## 2013-11-02 NOTE — Telephone Encounter (Signed)
Pt states she has been having problems with reflux and abdominal pain, states PCP told her she needed an EGD. Pt scheduled to see Dr. Ardis Hughs tomorrow at 2:15pm. Pt tentatively scheduled for EGD in the [place] 11/13/13@9am . Pt aware of appts.  Referral entered in epic for EGD.

## 2013-11-03 ENCOUNTER — Encounter: Payer: Self-pay | Admitting: Gastroenterology

## 2013-11-03 ENCOUNTER — Ambulatory Visit (INDEPENDENT_AMBULATORY_CARE_PROVIDER_SITE_OTHER): Payer: 59 | Admitting: Gastroenterology

## 2013-11-03 VITALS — BP 122/70 | HR 76 | Ht 68.75 in | Wt 165.4 lb

## 2013-11-03 DIAGNOSIS — R1013 Epigastric pain: Secondary | ICD-10-CM

## 2013-11-03 DIAGNOSIS — G8929 Other chronic pain: Secondary | ICD-10-CM

## 2013-11-03 DIAGNOSIS — K5900 Constipation, unspecified: Secondary | ICD-10-CM

## 2013-11-03 MED ORDER — ESOMEPRAZOLE MAGNESIUM 40 MG PO CPDR: 40.0000 mg | DELAYED_RELEASE_CAPSULE | Freq: Every day | ORAL | Status: DC

## 2013-11-03 NOTE — Progress Notes (Signed)
Review of pertinent gastrointestinal problems: 1. Routine risk for colon cancer; colonoscopy 11/2006 Ardis Hughs done for constipation found no polyps. Recall colonoscopy for CRC screening recommended at 10 year interval. 2. Constipation, improved on QOD miralax (2008)   HPI: This is a   very pleasant 63 year old woman whom I last saw 7 or 8 years ago the time of a colonoscopy. She is here today for a new reason.  miralax gives her a lot of gas, stopped taking it.  Tries to take fiber  Drinks "smooth move" about once per week, senekot.    She would go a week without BMs.  Had bleeding ulcer, per patient, "the size of a quarter" from what she was told by [facility].  Previously taking Nexium prescription, changed to OTC strenght.  Over memorial day, was very Tour manager.  Took several gas ex. Also nauseated.  Had epigastric pain over Mem weekend, lasted for 2 hours.  Eased off with time only.  Same pain, epigastric, radiating around to her back, lasted 1-2 hours.  Made her very nauseas, fatigued.  Weight; overall has been losing weight intentionally. Past year up 7 pounds, after intentionally losing quite a long.  No NSAIDs.  Has gone back to nexium 40mg  daily, improved.   Labs 09/2013: cbc, cmet, amylase, lipase were all normal [place] 09/2013 done for epig pain, nausea: normal    Review of systems: Pertinent positive and negative review of systems were noted in the above HPI section. Complete review of systems was performed and was otherwise normal.    Past Medical History  Diagnosis Date  . Uterine prolapse   . Cystocele   . GERD (gastroesophageal reflux disease)   . Elevated cholesterol   . Rectocele   . Hypertension   . Arthritis   . Type II or unspecified type diabetes mellitus without mention of complication, uncontrolled     Type 2  . Urinary incontinence     Urgency  . Anxiety state, unspecified   . Palpitations   . Allergic rhinitis, cause unspecified   . Other and unspecified  hyperlipidemia   . Unspecified hypothyroidism   . Edema   . Insomnia, unspecified   . Depressive disorder, not elsewhere classified   . Unspecified urinary incontinence   . Peptic ulcer, unspecified site, unspecified as acute or chronic, without mention of hemorrhage, perforation, or obstruction   . Calculus of kidney   . Disturbance of skin sensation     Past Surgical History  Procedure Laterality Date  . Tonsillectomy and adenoidectomy  1963  . Dilation and curettage of uterus      3-05  . Hysteroscopy      3-05  . Endometrial biopsy      Dr.Gottsegen   . Tongue biopsy      Current Outpatient Prescriptions  Medication Sig Dispense Refill  . atorvastatin (LIPITOR) 10 MG tablet One daily to control cholesterol  90 tablet  3  . cholecalciferol (VITAMIN D) 1000 UNITS tablet Take 1,000 Units by mouth daily.      Marland Kitchen co-enzyme Q-10 30 MG capsule Take 30 mg by mouth 3 (three) times daily.      Marland Kitchen esomeprazole (NEXIUM) 40 MG capsule One daily to reduce stomach acid, OTC      . fish oil-omega-3 fatty acids 1000 MG capsule Take 2 g by mouth daily.      . hydrochlorothiazide (HYDRODIURIL) 25 MG tablet Take one tablet once a day to control swelling/blood pressure  90 tablet  3  .  levothyroxine (SYNTHROID, LEVOTHROID) 125 MCG tablet TAKE ONE TABLET BY MOUTH ONCE DAILY  90 tablet  1  . LORazepam (ATIVAN) 1 MG tablet Take one tablet up to three times daily as needed for anxiety  30 tablet  5  . metFORMIN (GLUCOPHAGE-XR) 500 MG 24 hr tablet TAKE 1 TABLET TWICE DAILY TO CONTROL BLOOD SUGAR  180 tablet  3  . ONE TOUCH ULTRA TEST test strip CHECK BLOOD GLUCOSE AS DIRECTED  100 each  3  . RESVERATROL PO Take by mouth. WITH D       . valACYclovir (VALTREX) 1000 MG tablet One twice daily to help resolve viral blisters  30 tablet  3   No current facility-administered medications for this visit.    Allergies as of 11/03/2013 - Review Complete 11/03/2013  Allergen Reaction Noted  . Latex  07/16/2011   . Naproxen  12/12/2012  . Nitrofurantoin monohyd macro  07/16/2011  . Paroxetine hcl  07/16/2011  . Prednisone  12/12/2012  . Prozac [fluoxetine hcl]  12/12/2012    Family History  Problem Relation Age of Onset  . Hypertension Mother   . Breast cancer Mother 17  . Heart disease Father   . Stroke Father   . Hypertension Sister   . Heart disease Sister   . Osteoporosis Sister   . Diabetes Brother   . Hypertension Brother   . Heart disease Brother   . Other Brother     BRAIN TUMOR  . Cancer Brother     Eye cancer-Melanoma  . Diabetes Brother   . Heart disease Brother   . Heart attack Sister   . Heart disease Brother   . Heart attack Brother     History   Social History  . Marital Status: Married    Spouse Name: N/A    Number of Children: N/A  . Years of Education: N/A   Occupational History  . Not on file.   Social History Main Topics  . Smoking status: Never Smoker   . Smokeless tobacco: Never Used  . Alcohol Use: No  . Drug Use: No  . Sexual Activity: No   Other Topics Concern  . Not on file   Social History Narrative  . No narrative on file       Physical Exam: BP 122/70  Pulse 76  Ht 5' 8.75" (1.746 m)  Wt 165 lb 6 oz (75.014 kg)  BMI 24.61 kg/m2 Constitutional: generally well-appearing Psychiatric: alert and oriented x3 Eyes: extraocular movements intact Mouth: oral pharynx moist, no lesions Neck: supple no lymphadenopathy Cardiovascular: heart regular rate and rhythm Lungs: clear to auscultation bilaterally Abdomen: soft, nontender, nondistended, no obvious ascites, no peritoneal signs, normal bowel sounds Extremities: no lower extremity edema bilaterally Skin: no lesions on visible extremities    Assessment and plan: 63 y.o. female with  recent epigastric pain, chronic constipation  I in going to give her samples of linzess low strength which she will take once daily and she will call back to report on her response. If this helps  her am happy to call her in prescription. Her constipation is chronic, evaluated many years ago but normal colonoscopy. Her recent intermittent epigastric pain might be gastric related, perhaps significant symptomatic GERD, perhaps biliary however she does not have gallstones or gallbladder. I will proceed with EGD at her soonest convenience and she will continue on once daily Nexium for now.

## 2013-11-03 NOTE — Patient Instructions (Addendum)
You will be set up for an upper endoscopy for epigastric pain. Stay on nexium 20-30 min before breakfast meal is best. Trial of linzess samples 145mcg, call in 7 days to report on your response.

## 2013-11-13 ENCOUNTER — Encounter: Payer: Self-pay | Admitting: Gastroenterology

## 2013-11-13 ENCOUNTER — Ambulatory Visit (AMBULATORY_SURGERY_CENTER): Payer: 59 | Admitting: Gastroenterology

## 2013-11-13 VITALS — BP 126/68 | HR 54 | Temp 96.7°F | Resp 17 | Ht 68.0 in | Wt 165.0 lb

## 2013-11-13 DIAGNOSIS — K2940 Chronic atrophic gastritis without bleeding: Secondary | ICD-10-CM

## 2013-11-13 DIAGNOSIS — R1013 Epigastric pain: Secondary | ICD-10-CM

## 2013-11-13 DIAGNOSIS — K2970 Gastritis, unspecified, without bleeding: Secondary | ICD-10-CM

## 2013-11-13 MED ORDER — SODIUM CHLORIDE 0.9 % IV SOLN: 500.0000 mL | INTRAVENOUS | Status: DC

## 2013-11-13 NOTE — Patient Instructions (Signed)
YOU HAD AN ENDOSCOPIC PROCEDURE TODAY AT THE [facility]: Refer to the procedure report that was given to you for any specific questions about what was found during the examination.  If the procedure report does not answer your questions, please call your gastroenterologist to clarify.  If you requested that your care partner not be given the details of your procedure findings, then the procedure report has been included in a sealed envelope for you to review at your convenience later.  YOU SHOULD EXPECT: Some feelings of bloating in the abdomen. Passage of more gas than usual.  Walking can help get rid of the air that was put into your GI tract during the procedure and reduce the bloating. If you had a lower endoscopy (such as a colonoscopy or flexible sigmoidoscopy) you may notice spotting of blood in your stool or on the toilet paper. If you underwent a bowel prep for your procedure, then you may not have a normal bowel movement for a few days.  DIET: Your first meal following the procedure should be a light meal and then it is ok to progress to your normal diet.  A half-sandwich or bowl of soup is an example of a good first meal.  Heavy or fried foods are harder to digest and may make you feel nauseous or bloated.  Likewise meals heavy in dairy and vegetables can cause extra gas to form and this can also increase the bloating.  Drink plenty of fluids but you should avoid alcoholic beverages for 24 hours.  ACTIVITY: Your care partner should take you home directly after the procedure.  You should plan to take it easy, moving slowly for the rest of the day.  You can resume normal activity the day after the procedure however you should NOT DRIVE or use heavy machinery for 24 hours (because of the sedation medicines used during the test).    SYMPTOMS TO REPORT IMMEDIATELY: A gastroenterologist can be reached at any hour.  During normal business hours, 8:30 AM to 5:00 PM Monday through Friday,  call [phone].  After hours and on weekends, please call the GI answering service at [phone] who will take a message and have the physician on call contact you.    Following upper endoscopy (EGD)  Vomiting of blood or coffee ground material  New chest pain or pain under the shoulder blades  Painful or persistently difficult swallowing  New shortness of breath  Fever of 100F or higher  Black, tarry-looking stools  FOLLOW UP: If any biopsies were taken you will be contacted by phone or by letter within the next 1-3 weeks.  Call your gastroenterologist if you have not heard about the biopsies in 3 weeks.  Our staff will call the home number listed on your records the next business day following your procedure to check on you and address any questions or concerns that you may have at that time regarding the information given to you following your procedure. This is a courtesy call and so if there is no answer at the home number and we have not heard from you through the emergency physician on call, we will assume that you have returned to your regular daily activities without incident.  SIGNATURES/CONFIDENTIALITY: You and/or your care partner have signed paperwork which will be entered into your electronic medical record.  These signatures attest to the fact that that the information above on your After Visit Summary has been reviewed and is understood.  Full   responsibility of the confidentiality of this discharge information lies with you and/or your care-partner.   Resume medications. Information given on gastritis with discharge instructions. 

## 2013-11-13 NOTE — Progress Notes (Signed)
Report to PACU, RN, vss, BBS= Clear.  

## 2013-11-13 NOTE — Op Note (Signed)
[facility]  [address]. [place], [id number]   ENDOSCOPY PROCEDURE REPORT  PATIENT: Kelly Fletcher, Kelly Fletcher  MR#: [id number] BIRTHDATE: 02/14/1951 , 62  yrs. old GENDER: Female ENDOSCOPIST: Milus Banister, MD PROCEDURE DATE:  11/13/2013 PROCEDURE:  EGD w/ biopsy ASA CLASS:     Class II INDICATIONS:  Dyspepsia. MEDICATIONS: MAC sedation, administered by CRNA and propofol (Diprivan) 150mg  IV TOPICAL ANESTHETIC: none  DESCRIPTION OF PROCEDURE: After the risks benefits and alternatives of the procedure were thoroughly explained, informed consent was obtained.  The LB [id number] endoscope was introduced through the mouth and advanced to the second portion of the duodenum. Without limitations.  The instrument was slowly withdrawn as the mucosa was fully examined.  There was mild, non-specific distal gastritis.  This was biopsied and sent to pathology.  The examination was otherwise normal. Retroflexed views revealed no abnormalities.     The scope was then withdrawn from the patient and the procedure completed. COMPLICATIONS: There were no complications.  ENDOSCOPIC IMPRESSION: There was mild, non-specific distal gastritis.  This was biopsied and sent to pathology.  The examination was otherwise normal.  RECOMMENDATIONS: If biopsies of stomach show H.  pylori, you will be started on appropriate antibiotics.   eSigned:  Milus Banister, MD 11/13/2013 9:09 AM   CC: Nelva Nay, MD

## 2013-11-13 NOTE — Progress Notes (Signed)
Called to room to assist during endoscopic procedure.  Patient ID and intended procedure confirmed with present staff. Received instructions for my participation in the procedure from the performing physician.  

## 2013-11-14 ENCOUNTER — Telehealth: Payer: Self-pay | Admitting: *Deleted

## 2013-11-14 NOTE — Telephone Encounter (Signed)
  Follow up Call-  Call back number 11/13/2013  Post procedure Call Back phone  # [phone]  Permission to leave phone message Yes     Patient questions:  Do you have a fever, pain , or abdominal swelling? no Pain Score  0 *  Have you tolerated food without any problems? yes  Have you been able to return to your normal activities? yes  Do you have any questions about your discharge instructions: Diet   no Medications  no Follow up visit  no  Do you have questions or concerns about your Care? no  Actions: * If pain score is 4 or above: No action needed, pain <4.

## 2013-11-17 ENCOUNTER — Telehealth: Payer: Self-pay | Admitting: Gastroenterology

## 2013-11-17 MED ORDER — ONDANSETRON HCL 4 MG PO TABS: 4.0000 mg | ORAL_TABLET | Freq: Three times a day (TID) | ORAL | Status: DC | PRN

## 2013-11-17 NOTE — Telephone Encounter (Signed)
Pt began having nausea and vomiting last night after having EGD on 11/13/13.  Has had normal bowel movements.  She has gas and bloating and very bad smelling gas.  She is on a clear liquid diet for now.  Please advise. I spoke with Dr Olevia Perches who is [facility] and she advises pt to have 4mg  zofran 1 every 8 hours and try gaviscon after meals and call on Monday with an update.  Pt is aware and will call on Monday with an update, she was advised to go to the ER if she worsens over the weekend.

## 2013-12-16 ENCOUNTER — Encounter (HOSPITAL_COMMUNITY): Payer: Self-pay | Admitting: Emergency Medicine

## 2013-12-16 ENCOUNTER — Emergency Department (HOSPITAL_COMMUNITY)
Admission: EM | Admit: 2013-12-16 | Discharge: 2013-12-16 | Disposition: A | Discharge status: Home / Self Care | Payer: Medicare Other | Attending: Emergency Medicine | Admitting: Emergency Medicine

## 2013-12-16 DIAGNOSIS — Z8742 Personal history of other diseases of the female genital tract: Secondary | ICD-10-CM | POA: Diagnosis not present

## 2013-12-16 DIAGNOSIS — Z8739 Personal history of other diseases of the musculoskeletal system and connective tissue: Secondary | ICD-10-CM | POA: Insufficient documentation

## 2013-12-16 DIAGNOSIS — R609 Edema, unspecified: Secondary | ICD-10-CM | POA: Insufficient documentation

## 2013-12-16 DIAGNOSIS — F329 Major depressive disorder, single episode, unspecified: Secondary | ICD-10-CM | POA: Insufficient documentation

## 2013-12-16 DIAGNOSIS — I10 Essential (primary) hypertension: Secondary | ICD-10-CM | POA: Diagnosis not present

## 2013-12-16 DIAGNOSIS — Z87442 Personal history of urinary calculi: Secondary | ICD-10-CM | POA: Insufficient documentation

## 2013-12-16 DIAGNOSIS — F411 Generalized anxiety disorder: Secondary | ICD-10-CM | POA: Insufficient documentation

## 2013-12-16 DIAGNOSIS — K279 Peptic ulcer, site unspecified, unspecified as acute or chronic, without hemorrhage or perforation: Secondary | ICD-10-CM | POA: Insufficient documentation

## 2013-12-16 DIAGNOSIS — Z91040 Latex allergy status: Secondary | ICD-10-CM | POA: Diagnosis not present

## 2013-12-16 DIAGNOSIS — E039 Hypothyroidism, unspecified: Secondary | ICD-10-CM | POA: Diagnosis not present

## 2013-12-16 DIAGNOSIS — K219 Gastro-esophageal reflux disease without esophagitis: Secondary | ICD-10-CM | POA: Diagnosis not present

## 2013-12-16 DIAGNOSIS — N390 Urinary tract infection, site not specified: Secondary | ICD-10-CM | POA: Insufficient documentation

## 2013-12-16 DIAGNOSIS — F3289 Other specified depressive episodes: Secondary | ICD-10-CM | POA: Insufficient documentation

## 2013-12-16 DIAGNOSIS — Z8709 Personal history of other diseases of the respiratory system: Secondary | ICD-10-CM | POA: Diagnosis not present

## 2013-12-16 DIAGNOSIS — E1165 Type 2 diabetes mellitus with hyperglycemia: Secondary | ICD-10-CM

## 2013-12-16 DIAGNOSIS — R569 Unspecified convulsions: Secondary | ICD-10-CM | POA: Insufficient documentation

## 2013-12-16 DIAGNOSIS — IMO0001 Reserved for inherently not codable concepts without codable children: Secondary | ICD-10-CM | POA: Insufficient documentation

## 2013-12-16 DIAGNOSIS — Z79899 Other long term (current) drug therapy: Secondary | ICD-10-CM | POA: Insufficient documentation

## 2013-12-16 DIAGNOSIS — E7800 Pure hypercholesterolemia, unspecified: Secondary | ICD-10-CM | POA: Diagnosis not present

## 2013-12-16 DIAGNOSIS — R259 Unspecified abnormal involuntary movements: Secondary | ICD-10-CM | POA: Diagnosis present

## 2013-12-16 LAB — CBC
HCT: 40.5 % (ref 36.0–46.0)
HEMOGLOBIN: 13.6 g/dL (ref 12.0–15.0)
MCH: 31.6 pg (ref 26.0–34.0)
MCHC: 33.6 g/dL (ref 30.0–36.0)
MCV: 94.2 fL (ref 78.0–100.0)
Platelets: 191 10*3/uL (ref 150–400)
RBC: 4.3 MIL/uL (ref 3.87–5.11)
RDW: 12.1 % (ref 11.5–15.5)
WBC: 6.8 10*3/uL (ref 4.0–10.5)

## 2013-12-16 LAB — BASIC METABOLIC PANEL
Anion gap: 17 — ABNORMAL HIGH (ref 5–15)
BUN: 21 mg/dL (ref 6–23)
CALCIUM: 9.3 mg/dL (ref 8.4–10.5)
CO2: 26 mEq/L (ref 19–32)
CREATININE: 0.8 mg/dL (ref 0.50–1.10)
Chloride: 98 mEq/L (ref 96–112)
GFR, EST AFRICAN AMERICAN: 88 mL/min — AB (ref >90)
GFR, EST NON AFRICAN AMERICAN: 76 mL/min — AB (ref >90)
GLUCOSE: 135 mg/dL — AB (ref 70–99)
Potassium: 3.9 mEq/L (ref 3.7–5.3)
Sodium: 141 mEq/L (ref 137–147)

## 2013-12-16 LAB — URINALYSIS, ROUTINE W REFLEX MICROSCOPIC
BILIRUBIN URINE: NEGATIVE
GLUCOSE, UA: NEGATIVE mg/dL
HGB URINE DIPSTICK: NEGATIVE
KETONES UR: NEGATIVE mg/dL
Nitrite: NEGATIVE
Protein, ur: NEGATIVE mg/dL
Specific Gravity, Urine: 1.019 (ref 1.005–1.030)
Urobilinogen, UA: 0.2 mg/dL (ref 0.0–1.0)
pH: 7.5 (ref 5.0–8.0)

## 2013-12-16 LAB — URINE MICROSCOPIC-ADD ON

## 2013-12-16 MED ORDER — LORAZEPAM 1 MG PO TABS
1.0000 mg | ORAL_TABLET | Freq: Once | ORAL | Status: AC
  Administered 2013-12-16: 1 mg via ORAL
  Filled 2013-12-16: qty 1

## 2013-12-16 MED ORDER — CEPHALEXIN 500 MG PO CAPS: 500.0000 mg | ORAL_CAPSULE | Freq: Four times a day (QID) | ORAL | Status: DC

## 2013-12-16 NOTE — ED Notes (Signed)
Resident, Dr. Fredderick Severance, at bedside

## 2013-12-16 NOTE — ED Provider Notes (Signed)
CSN: [id number]     Arrival date & time 12/16/13  1529 History   First MD Initiated Contact with Patient 12/16/13 1532     Chief Complaint  Patient presents with  . facial twitching      (Consider location/radiation/quality/duration/timing/severity/associated sxs/prior Treatment) Patient is a 63 y.o. female presenting with neurologic complaint.  Neurologic Problem This is a new problem. The current episode started today. The problem occurs rarely. The problem has been resolved. Pertinent negatives include no abdominal pain, chest pain, congestion, coughing, headaches, nausea or vomiting. The symptoms are aggravated by stress. She has tried rest for the symptoms.    Past Medical History  Diagnosis Date  . Uterine prolapse   . Cystocele   . GERD (gastroesophageal reflux disease)   . Elevated cholesterol   . Rectocele   . Hypertension   . Arthritis   . Type II or unspecified type diabetes mellitus without mention of complication, uncontrolled     Type 2  . Urinary incontinence     Urgency  . Anxiety state, unspecified   . Palpitations   . Allergic rhinitis, cause unspecified   . Other and unspecified hyperlipidemia   . Unspecified hypothyroidism   . Edema   . Insomnia, unspecified   . Depressive disorder, not elsewhere classified   . Unspecified urinary incontinence   . Peptic ulcer, unspecified site, unspecified as acute or chronic, without mention of hemorrhage, perforation, or obstruction   . Calculus of kidney   . Disturbance of skin sensation    Past Surgical History  Procedure Laterality Date  . Tonsillectomy and adenoidectomy  1963  . Dilation and curettage of uterus      3-05  . Hysteroscopy      3-05  . Endometrial biopsy      Dr.Gottsegen   . Tongue biopsy     Family History  Problem Relation Age of Onset  . Hypertension Mother   . Breast cancer Mother 29  . Heart disease Father   . Stroke Father   . Hypertension Sister   . Heart disease Sister   .  Osteoporosis Sister   . Diabetes Brother   . Hypertension Brother   . Heart disease Brother   . Other Brother     BRAIN TUMOR  . Cancer Brother     Eye cancer-Melanoma  . Diabetes Brother   . Heart disease Brother   . Heart attack Sister   . Heart disease Brother   . Heart attack Brother    History  Substance Use Topics  . Smoking status: Never Smoker   . Smokeless tobacco: Never Used  . Alcohol Use: No   OB History   Grav Para Term Preterm Abortions TAB SAB Ect Mult Living   1 1 1       1      Review of Systems  Constitutional: Negative for activity change.  HENT: Negative for congestion.   Respiratory: Negative for cough and shortness of breath.   Cardiovascular: Negative for chest pain and leg swelling.  Gastrointestinal: Negative for nausea, vomiting, abdominal pain, diarrhea, constipation, blood in stool and abdominal distention.  Genitourinary: Positive for frequency. Negative for dysuria, flank pain and vaginal discharge.  Musculoskeletal: Negative for back pain.  Skin: Negative for color change.  Neurological: Negative for syncope and headaches.       Twitching  Psychiatric/Behavioral: Negative for agitation.  All other systems reviewed and are negative.     Allergies  Latex; Naproxen; Nitrofurantoin monohyd macro;  Paroxetine hcl; Prednisone; and Prozac  Home Medications   Prior to Admission medications   Medication Sig Start Date End Date Taking? Authorizing Provider  atorvastatin (LIPITOR) 10 MG tablet One daily to control cholesterol 12/13/12   Estill Dooms, MD  cholecalciferol (VITAMIN D) 1000 UNITS tablet Take 1,000 Units by mouth daily.    Historical Provider, MD  co-enzyme Q-10 30 MG capsule Take 30 mg by mouth 3 (three) times daily.    Historical Provider, MD  esomeprazole (NEXIUM) 40 MG capsule Take 1 capsule (40 mg total) by mouth daily. One daily to reduce stomach acid, OTC 11/03/13   Milus Banister, MD  fish oil-omega-3 fatty acids 1000 MG  capsule Take 2 g by mouth daily.    Historical Provider, MD  hydrochlorothiazide (HYDRODIURIL) 25 MG tablet Take one tablet once a day to control swelling/blood pressure 05/04/13   Pricilla Larsson, NP  levothyroxine (SYNTHROID, LEVOTHROID) 125 MCG tablet TAKE ONE TABLET BY MOUTH ONCE DAILY 10/30/13   Estill Dooms, MD  LORazepam (ATIVAN) 1 MG tablet Take one tablet up to three times daily as needed for anxiety 12/13/12   Estill Dooms, MD  metFORMIN (GLUCOPHAGE-XR) 500 MG 24 hr tablet TAKE 1 TABLET TWICE DAILY TO CONTROL BLOOD SUGAR 08/04/13   Estill Dooms, MD  ondansetron (ZOFRAN) 4 MG tablet Take 1 tablet (4 mg total) by mouth every 8 (eight) hours as needed for nausea or vomiting. 11/17/13   Lafayette Dragon, MD  ONE TOUCH ULTRA TEST test strip CHECK BLOOD GLUCOSE AS DIRECTED 08/30/13   Estill Dooms, MD  RESVERATROL PO Take by mouth. WITH D     Historical Provider, MD  valACYclovir (VALTREX) 1000 MG tablet One twice daily to help resolve viral blisters 10/16/13   Estill Dooms, MD   There were no vitals taken for this visit. Physical Exam  Constitutional: She is oriented to person, place, and time. She appears well-developed.  HENT:  Head: Normocephalic.  Eyes: Pupils are equal, round, and reactive to light.  Neck: Neck supple.  Cardiovascular: Normal rate.  Exam reveals no gallop and no friction rub.   No murmur heard. Pulmonary/Chest: Effort normal and breath sounds normal. No respiratory distress.  Abdominal: Soft. She exhibits no distension. There is no tenderness. There is no rebound.  Musculoskeletal: She exhibits no edema.  Neurological: She is alert and oriented to person, place, and time.  Skin: Skin is warm.  Psychiatric: She has a normal mood and affect.   Cranial nerves III-XII grossly intact Strength 5+/5+ to upper and lower extremities bilaterally with resistance applied, equal distribution noted Strength intact to MCP, PIP, DIP joints of  hand Negative arm drift Fine motor  skills intact Heel to knee down shin normal bilaterally Gait proper, proper balance - negative sway, negative drift, negative step-offs   ED Course  Procedures (including critical care time) Labs Review Labs Reviewed  BASIC METABOLIC PANEL - Abnormal; Notable for the following:    Glucose, Bld 135 (*)    GFR calc non Af Amer 76 (*)    GFR calc Af Amer 88 (*)    Anion gap 17 (*)    All other components within normal limits  URINALYSIS, ROUTINE W REFLEX MICROSCOPIC - Abnormal; Notable for the following:    Leukocytes, UA MODERATE (*)    All other components within normal limits  URINE MICROSCOPIC-ADD ON - Abnormal; Notable for the following:    Bacteria, UA MANY (*)  All other components within normal limits  CBC    Imaging Review No results found.   EKG Interpretation   Date/Time:  Saturday December 16 2013 15:45:24 EDT Ventricular Rate:  77 PR Interval:  168 QRS Duration: 102 QT Interval:  430 QTC Calculation: 487 R Axis:   -22 Text Interpretation:  Sinus rhythm Borderline left axis deviation Abnormal  R-wave progression, early transition Borderline prolonged QT interval ED  PHYSICIAN INTERPRETATION AVAILABLE IN [facility] Confirmed by TEST,  Record ([id number]) on 12/18/2013 7:17:56 AM      MDM   Final diagnoses:  UTI (lower urinary tract infection)   63 year old female with past medical history of diabetes and hypertension the presents for concern of left face jerking. Patient states that she suddenly had onset of of a twitching moving her entire left side of her face. Patient with concern that this was a sign of a stroke the presents to the emergency department. By the time EMS was on scene the twitching had stopped. Patient endorses recent sleep deprivation caffeine use and stress. During examination the patient has no complaints and states that all of her symptoms have resolved. No focal deficits on neuro exam and gait is not ataxic. Very unlikely this represents  stroke. Basic screening labs performed and are noncontributory. Urinalysis shows evidence of UTI. Patient discharged on Keflex instructed to followup with primary care provider    Claudean Severance, MD 12/18/13 [id number]

## 2013-12-16 NOTE — ED Notes (Signed)
Pt states improvement with warm packs.  PA at bedside.

## 2013-12-16 NOTE — ED Notes (Signed)
Resident at bedside.  

## 2013-12-20 NOTE — ED Provider Notes (Signed)
Medical screening examination/treatment/procedure(s) were conducted as a shared visit with non-physician practitioner(s) and myself.  I personally evaluated the patient during the encounter.   EKG Interpretation   Date/Time:  Saturday December 16 2013 15:45:24 EDT Ventricular Rate:  77 PR Interval:  168 QRS Duration: 102 QT Interval:  430 QTC Calculation: 487 R Axis:   -22 Text Interpretation:  Sinus rhythm Borderline left axis deviation Abnormal  R-wave progression, early transition Borderline prolonged QT interval ED  PHYSICIAN INTERPRETATION AVAILABLE IN [facility] Confirmed by TEST,  Record ([id number]) on 12/18/2013 7:17:56 AM     63yF with a couple minute episode of facial spasm. Currently resolved and no complaints. No focal neuro exam. No recent med changes. Lytes ok. Under a lot of stress with complex social situation. Lack of sleep may be contributing.   Virgel Manifold, MD 12/20/13 [phone]

## 2013-12-26 ENCOUNTER — Encounter: Payer: Self-pay | Admitting: Internal Medicine

## 2013-12-26 ENCOUNTER — Ambulatory Visit (INDEPENDENT_AMBULATORY_CARE_PROVIDER_SITE_OTHER): Payer: Medicare Other | Admitting: Internal Medicine

## 2013-12-26 VITALS — BP 148/84 | HR 70 | Temp 98.4°F | Resp 10 | Wt 165.0 lb

## 2013-12-26 DIAGNOSIS — G5139 Clonic hemifacial spasm, unspecified: Secondary | ICD-10-CM | POA: Insufficient documentation

## 2013-12-26 DIAGNOSIS — G518 Other disorders of facial nerve: Secondary | ICD-10-CM

## 2013-12-26 MED ORDER — HYDROCHLOROTHIAZIDE 25 MG PO TABS: 25.0000 mg | ORAL_TABLET | Freq: Every day | ORAL | Status: DC

## 2013-12-26 MED ORDER — ESOMEPRAZOLE MAGNESIUM 40 MG PO CPDR: 40.0000 mg | DELAYED_RELEASE_CAPSULE | Freq: Every day | ORAL | Status: DC

## 2013-12-26 MED ORDER — LEVOTHYROXINE SODIUM 125 MCG PO TABS: 125.0000 ug | ORAL_TABLET | Freq: Every day | ORAL | Status: DC

## 2013-12-26 NOTE — Patient Instructions (Signed)
Return if the spasm reoccurs.

## 2013-12-26 NOTE — Progress Notes (Signed)
Patient ID: Kelly Fletcher, female   DOB: [birth year], 63 y.o.   MRN: [id number]    Location:    [facility]  Place of Service:  OFFICE    Allergies  Allergen Reactions  . Ibuprofen   . Latex   . Naproxen   . Nitrofurantoin Monohyd Macro   . Paroxetine Hcl   . Prednisone   . Prozac [Fluoxetine Hcl]     Chief Complaint  Patient presents with  . Hospitalization Follow-up    Patient was seen in the hospital for a UTI on 12/16/2013    HPI:  Seen in ER 12/16/13:  "63 year old female with past medical history of diabetes and hypertension, presents for concern of left face jerking. Patient states that she suddenly had onset of of a twitching moving her entire left side of her face. Patient with concern that this was a sign of a stroke the presents to the emergency department. By the time EMS was on scene the twitching had stopped. Patient endorses recent sleep deprivation caffeine use and stress. During examination the patient has no complaints and states that all of her symptoms have resolved. No focal deficits on neuro exam and gait is not ataxic. Very unlikely this represents stroke. Basic screening labs performed and are noncontributory. Urinalysis shows evidence of UTI. 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 She has not had any other episodes of this since her visit to the ER. There were no other associated neurologic abnormalities at the time of her hemifacial spasm. There was a mild headache that resolved quickly.  She had a UTI diagnosed based on a non clean catch specimen that showed many bacteria and moderate leukocytes, but was nitrite negative. Since she had no dysuria or fever or nausea. I am skeptIcal that she had a UTI.  Medications: Patient's Medications  New Prescriptions   No medications on file  Previous Medications   ALUM HYDROXIDE-MAG CARBONATE  (GAVISCON PO)    Take by mouth daily.   ATORVASTATIN (LIPITOR) 10 MG TABLET    Take 10 mg by mouth daily. To control cholesterol   CHOLECALCIFEROL (VITAMIN D) 1000 UNITS TABLET    Take 1,000 Units by mouth daily.   CO-ENZYME Q-10 30 MG CAPSULE    Take 30 mg by mouth 3 (three) times daily.   FISH OIL-OMEGA-3 FATTY ACIDS 1000 MG CAPSULE    Take 2 g by mouth daily.   LORAZEPAM (ATIVAN) 1 MG TABLET    Take 1 mg by mouth 3 (three) times daily as needed for anxiety.   METFORMIN (GLUCOPHAGE-XR) 500 MG 24 HR TABLET    Take 500 mg by mouth 2 (two) times daily. To control blood sugar   ONE TOUCH ULTRA TEST TEST STRIP    CHECK BLOOD GLUCOSE AS DIRECTED   RESVERATROL PO    Take by mouth. WITH D    VALACYCLOVIR (VALTREX) 1000 MG TABLET    Take 1,000 mg by mouth 2 (two) times daily. To help resolve viral blisters  Modified Medications   Modified Medication Previous Medication   ESOMEPRAZOLE (NEXIUM) 40 MG CAPSULE esomeprazole (NEXIUM) 40 MG capsule      Take 1 capsule (40 mg total) by mouth daily at 12 noon. To reduce stomach acid    Take 40 mg by mouth daily at 12 noon. To reduce stomach acid   HYDROCHLOROTHIAZIDE (HYDRODIURIL) 25 MG TABLET  hydrochlorothiazide (HYDRODIURIL) 25 MG tablet      Take 1 tablet (25 mg total) by mouth daily. To control swelling / blood pressure    Take 25 mg by mouth daily. To control swelling / blood pressure   LEVOTHYROXINE (SYNTHROID, LEVOTHROID) 125 MCG TABLET levothyroxine (SYNTHROID, LEVOTHROID) 125 MCG tablet      Take 1 tablet (125 mcg total) by mouth daily before breakfast.    Take 125 mcg by mouth daily before breakfast.  Discontinued Medications   CEPHALEXIN (KEFLEX) 500 MG CAPSULE    Take 1 capsule (500 mg total) by mouth 4 (four) times daily.     Review of Systems  Constitutional: Positive for fatigue. Negative for fever, chills, activity change and appetite change.  HENT: Negative.   Eyes: Negative.   Respiratory: Negative.   Cardiovascular: Negative.     Gastrointestinal:       Epigastric discomfort  Endocrine: Negative.   Genitourinary: Negative.   Musculoskeletal: Negative.   Skin: Negative.   Allergic/Immunologic: Negative.   Neurological:       Single episode of left facial hemispasm on 12/16/13.  Hematological: Negative.   Psychiatric/Behavioral:       Chronic stress    Filed Vitals:   12/26/13 1524  BP: 148/84  Pulse: 70  Temp: 98.4 F (36.9 C)  TempSrc: Oral  Resp: 10  Weight: 165 lb (74.844 kg)  SpO2: 98%   Body mass index is 25.09 kg/(m^2).  Physical Exam  Constitutional: She is oriented to person, place, and time. She appears well-developed and well-nourished. No distress.  HENT:  Nose: Nose normal.  Mouth/Throat: No oropharyngeal exudate.  Eyes: Conjunctivae and EOM are normal. Pupils are equal, round, and reactive to light. Left eye exhibits no discharge.  Neck: No JVD present. No tracheal deviation present. No thyromegaly present.  Cardiovascular: Normal rate, regular rhythm, normal heart sounds and intact distal pulses.  Exam reveals no gallop and no friction rub.   No murmur heard. Pulmonary/Chest: No respiratory distress. She has no wheezes. She has no rales.  Abdominal: She exhibits no distension and no mass. There is no tenderness.  Musculoskeletal: Normal range of motion. She exhibits no edema and no tenderness.  Lymphadenopathy:    She has no cervical adenopathy.  Neurological: She is alert and oriented to person, place, and time. She has normal reflexes. No cranial nerve deficit. Coordination normal.  Skin: No rash noted. No erythema. No pallor.  Psychiatric: She has a normal mood and affect. Her behavior is normal. Judgment and thought content normal.     Labs reviewed: Admission on 12/16/2013, Discharged on 12/16/2013  Component Date Value Ref Range Status  . WBC 12/16/2013 6.8  4.0 - 10.5 K/uL Final  . RBC 12/16/2013 4.30  3.87 - 5.11 MIL/uL Final  . Hemoglobin 12/16/2013 13.6  12.0 - 15.0  g/dL Final  . HCT 12/16/2013 40.5  36.0 - 46.0 % Final  . MCV 12/16/2013 94.2  78.0 - 100.0 fL Final  . MCH 12/16/2013 31.6  26.0 - 34.0 pg Final  . MCHC 12/16/2013 33.6  30.0 - 36.0 g/dL Final  . RDW 12/16/2013 12.1  11.5 - 15.5 % Final  . Platelets 12/16/2013 191  150 - 400 K/uL Final  . Sodium 12/16/2013 141  137 - 147 mEq/L Final  . Potassium 12/16/2013 3.9  3.7 - 5.3 mEq/L Final  . Chloride 12/16/2013 98  96 - 112 mEq/L Final  . CO2 12/16/2013 26  19 - 32 mEq/L Final  .  Glucose, Bld 12/16/2013 135* 70 - 99 mg/dL Final  . BUN 19/06/2222 21  6 - 23 mg/dL Final  . Creatinine, Ser 12/16/2013 0.80  0.50 - 1.10 mg/dL Final  . Calcium 11/46/4314 9.3  8.4 - 10.5 mg/dL Final  . GFR calc non Af Amer 12/16/2013 76* >90 mL/min Final  . GFR calc Af Amer 12/16/2013 88* >90 mL/min Final   Comment: (NOTE)                          The eGFR has been calculated using the CKD EPI equation.                          This calculation has not been validated in all clinical situations.                          eGFR's persistently <90 mL/min signify possible Chronic Kidney                          Disease.  . Anion gap 12/16/2013 17* 5 - 15 Final  . Color, Urine 12/16/2013 YELLOW  YELLOW Final  . APPearance 12/16/2013 CLEAR  CLEAR Final  . Specific Gravity, Urine 12/16/2013 1.019  1.005 - 1.030 Final  . pH 12/16/2013 7.5  5.0 - 8.0 Final  . Glucose, UA 12/16/2013 NEGATIVE  NEGATIVE mg/dL Final  . Hgb urine dipstick 12/16/2013 NEGATIVE  NEGATIVE Final  . Bilirubin Urine 12/16/2013 NEGATIVE  NEGATIVE Final  . Ketones, ur 12/16/2013 NEGATIVE  NEGATIVE mg/dL Final  . Protein, ur 27/67/0110 NEGATIVE  NEGATIVE mg/dL Final  . Urobilinogen, UA 12/16/2013 0.2  0.0 - 1.0 mg/dL Final  . Nitrite 03/49/6116 NEGATIVE  NEGATIVE Final  . Leukocytes, UA 12/16/2013 MODERATE* NEGATIVE Final  . Squamous Epithelial / LPF 12/16/2013 RARE  RARE Final  . WBC, UA 12/16/2013 3-6  <3 WBC/hpf Final  . Bacteria, UA 12/16/2013  MANY* RARE Final  Appointment on 10/30/2013  Component Date Value Ref Range Status  . Hemoglobin A1C 10/25/2013 6.0* 4.8 - 5.6 % Final   Comment:          Increased risk for diabetes: 5.7 - 6.4                                   Diabetes: >6.4                                   Glycemic control for adults with diabetes: <7.0  . Estimated average glucose 10/25/2013 126   Final  . TSH 10/25/2013 2.630  0.450 - 4.500 uIU/mL Final  Office Visit on 10/25/2013  Component Date Value Ref Range Status  . Glucose 10/25/2013 104* 65 - 99 mg/dL Final  . BUN 43/53/9122 16  8 - 27 mg/dL Final  . Creatinine, Ser 10/25/2013 0.80  0.57 - 1.00 mg/dL Final  . GFR calc non Af Amer 10/25/2013 78  >59 mL/min/1.73 Final  . GFR calc Af Amer 10/25/2013 90  >59 mL/min/1.73 Final  . BUN/Creatinine Ratio 10/25/2013 20  11 - 26 Final  . Sodium 10/25/2013 144  134 - 144 mmol/L Final  . Potassium 10/25/2013 3.9  3.5 - 5.2 mmol/L Final  . Chloride  10/25/2013 102  97 - 108 mmol/L Final  . CO2 10/25/2013 27  18 - 29 mmol/L Final  . Calcium 10/25/2013 9.3  8.7 - 10.3 mg/dL Final  . Total Protein 10/25/2013 6.1  6.0 - 8.5 g/dL Final  . Albumin 10/25/2013 3.9  3.6 - 4.8 g/dL Final  . Globulin, Total 10/25/2013 2.2  1.5 - 4.5 g/dL Final  . Albumin/Globulin Ratio 10/25/2013 1.8  1.1 - 2.5 Final  . Total Bilirubin 10/25/2013 0.6  0.0 - 1.2 mg/dL Final  . Alkaline Phosphatase 10/25/2013 52  39 - 117 IU/L Final  . AST 10/25/2013 17  0 - 40 IU/L Final  . ALT 10/25/2013 19  0 - 32 IU/L Final  . Lipase 10/25/2013 34  0 - 59 U/L Final  . Amylase 10/25/2013 51  31 - 124 U/L Final  . WBC 10/25/2013 8.7  3.4 - 10.8 x10E3/uL Final  . RBC 10/25/2013 4.44  3.77 - 5.28 x10E6/uL Final  . Hemoglobin 10/25/2013 13.8  11.1 - 15.9 g/dL Final  . HCT 10/25/2013 42.0  34.0 - 46.6 % Final  . MCV 10/25/2013 95  79 - 97 fL Final  . MCH 10/25/2013 31.1  26.6 - 33.0 pg Final  . MCHC 10/25/2013 32.9  31.5 - 35.7 g/dL Final  . RDW 10/25/2013 12.6   12.3 - 15.4 % Final  . Platelets 10/25/2013 252  150 - 379 x10E3/uL Final  . Neutrophils Relative % 10/25/2013 54   Final  . Lymphs 10/25/2013 34   Final  . Monocytes 10/25/2013 5   Final  . Eos 10/25/2013 7   Final  . Basos 10/25/2013 0   Final  . Neutrophils Absolute 10/25/2013 4.7  1.4 - 7.0 x10E3/uL Final  . Lymphocytes Absolute 10/25/2013 3.0  0.7 - 3.1 x10E3/uL Final  . Monocytes Absolute 10/25/2013 0.5  0.1 - 0.9 x10E3/uL Final  . Eosinophils Absolute 10/25/2013 0.6* 0.0 - 0.4 x10E3/uL Final  . Basophils Absolute 10/25/2013 0.0  0.0 - 0.2 x10E3/uL Final  . Immature Granulocytes 10/25/2013 0   Final  . Immature Grans (Abs) 10/25/2013 0.0  0.0 - 0.1 x10E3/uL Final      Assessment/Plan  Hemifacial spasm Patient reassured. Given Scientist, clinical (histocompatibility and immunogenetics). Hopefully, this will not occur again. If it reoccurs, she will use her phone for photo and video. She will contact me for further evaluation to include CT brain and EEG.  Refer to: [email or web address]

## 2013-12-29 LAB — HM MAMMOGRAPHY

## 2014-01-01 ENCOUNTER — Encounter: Payer: Self-pay | Admitting: *Deleted

## 2014-01-01 ENCOUNTER — Encounter: Payer: Self-pay | Admitting: Gynecology

## 2014-01-05 ENCOUNTER — Other Ambulatory Visit: Payer: Self-pay | Admitting: Internal Medicine

## 2014-01-22 ENCOUNTER — Other Ambulatory Visit: Payer: 59

## 2014-01-22 DIAGNOSIS — I10 Essential (primary) hypertension: Secondary | ICD-10-CM

## 2014-01-22 DIAGNOSIS — E785 Hyperlipidemia, unspecified: Secondary | ICD-10-CM

## 2014-01-22 DIAGNOSIS — E119 Type 2 diabetes mellitus without complications: Secondary | ICD-10-CM

## 2014-01-22 DIAGNOSIS — E039 Hypothyroidism, unspecified: Secondary | ICD-10-CM

## 2014-01-23 LAB — COMPREHENSIVE METABOLIC PANEL
A/G RATIO: 2.2 (ref 1.1–2.5)
ALBUMIN: 4.2 g/dL (ref 3.6–4.8)
ALK PHOS: 46 IU/L (ref 39–117)
ALT: 16 IU/L (ref 0–32)
AST: 16 IU/L (ref 0–40)
BILIRUBIN TOTAL: 0.5 mg/dL (ref 0.0–1.2)
BUN / CREAT RATIO: 24 (ref 11–26)
BUN: 20 mg/dL (ref 8–27)
CO2: 25 mmol/L (ref 18–29)
Calcium: 9.5 mg/dL (ref 8.7–10.3)
Chloride: 101 mmol/L (ref 97–108)
Creatinine, Ser: 0.82 mg/dL (ref 0.57–1.00)
GFR calc Af Amer: 87 mL/min/{1.73_m2} (ref >59)
GFR, EST NON AFRICAN AMERICAN: 75 mL/min/{1.73_m2} (ref >59)
GLUCOSE: 101 mg/dL — AB (ref 65–99)
Globulin, Total: 1.9 g/dL (ref 1.5–4.5)
Potassium: 3.7 mmol/L (ref 3.5–5.2)
Sodium: 142 mmol/L (ref 134–144)
TOTAL PROTEIN: 6.1 g/dL (ref 6.0–8.5)

## 2014-01-23 LAB — LIPID PANEL
Chol/HDL Ratio: 3 ratio units (ref 0.0–4.4)
Cholesterol, Total: 152 mg/dL (ref 100–199)
HDL: 50 mg/dL (ref >39)
LDL Calculated: 83 mg/dL (ref 0–99)
TRIGLYCERIDES: 97 mg/dL (ref 0–149)
VLDL Cholesterol Cal: 19 mg/dL (ref 5–40)

## 2014-01-23 LAB — HEMOGLOBIN A1C
Est. average glucose Bld gHb Est-mCnc: 126 mg/dL
Hgb A1c MFr Bld: 6 % — ABNORMAL HIGH (ref 4.8–5.6)

## 2014-01-23 LAB — TSH: TSH: 2.98 u[IU]/mL (ref 0.450–4.500)

## 2014-01-24 ENCOUNTER — Ambulatory Visit (INDEPENDENT_AMBULATORY_CARE_PROVIDER_SITE_OTHER): Payer: Medicare Other | Admitting: Internal Medicine

## 2014-01-24 ENCOUNTER — Encounter: Payer: Self-pay | Admitting: Internal Medicine

## 2014-01-24 VITALS — BP 138/90 | HR 62 | Resp 10 | Ht 69.0 in | Wt 164.2 lb

## 2014-01-24 DIAGNOSIS — E119 Type 2 diabetes mellitus without complications: Secondary | ICD-10-CM

## 2014-01-24 DIAGNOSIS — I10 Essential (primary) hypertension: Secondary | ICD-10-CM

## 2014-01-24 DIAGNOSIS — E785 Hyperlipidemia, unspecified: Secondary | ICD-10-CM

## 2014-01-24 DIAGNOSIS — E039 Hypothyroidism, unspecified: Secondary | ICD-10-CM

## 2014-01-24 DIAGNOSIS — G518 Other disorders of facial nerve: Secondary | ICD-10-CM

## 2014-01-24 DIAGNOSIS — R1013 Epigastric pain: Secondary | ICD-10-CM

## 2014-01-24 DIAGNOSIS — F411 Generalized anxiety disorder: Secondary | ICD-10-CM

## 2014-01-24 DIAGNOSIS — G5139 Clonic hemifacial spasm, unspecified: Secondary | ICD-10-CM

## 2014-01-24 MED ORDER — LORAZEPAM 0.5 MG PO TABS: 0.5000 mg | ORAL_TABLET | Freq: Two times a day (BID) | ORAL | Status: DC | PRN

## 2014-01-24 NOTE — Progress Notes (Signed)
Patient ID: Kelly Fletcher, female   DOB: 1951-04-25, 63 y.o.   MRN: [id number]    Location:    [facility]  Place of Service:  OFFICE    Allergies  Allergen Reactions  . Ibuprofen   . Latex   . Naproxen   . Nitrofurantoin Monohyd Macro   . Paroxetine Hcl   . Prednisone   . Prozac [Fluoxetine Hcl]     Chief Complaint  Patient presents with  . Medical Management of Chronic Issues    3 month follow-up and discuss labs   . Mouth concerns    Jerking of mouth on 01/14/14  . FYI    Patient will have teeth extraction this Friday     HPI:  Getting 2 teeth on the right side extracted 01/24/14 due to abscess. Has been on clindamycin since 8/16/5.   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 Has minor episodes of twitching as well. She believes anxiety makes the twitching and the spasm more likely to occur. Continues to care for her granddaughter. She has intense separation anxiety spells that increase the anxiety of [place]. Ativan helps. No headaches after episodes now.  Anxiety state, unspecified: unchanged  Epigastric pain: resolved  Other and unspecified hyperlipidemia:compensated    Medications: Patient's Medications  New Prescriptions   No medications on file  Previous Medications   ALUM HYDROXIDE-MAG CARBONATE (GAVISCON PO)    Take by mouth daily.   ATORVASTATIN (LIPITOR) 10 MG TABLET    ONE DAILY TO CONTROL CHOLESTEROL   CHOLECALCIFEROL (VITAMIN D) 1000 UNITS TABLET    Take 1,000 Units by mouth daily.   CLINDAMYCIN (CLEOCIN) 300 MG CAPSULE    Take 300 mg by mouth 3 (three) times daily.   CO-ENZYME Q-10 30 MG CAPSULE    Take 30 mg by mouth 3 (three) times daily.   ESOMEPRAZOLE (NEXIUM) 40 MG CAPSULE    Take 1 capsule (40 mg total) by mouth daily at 12 noon. To reduce stomach  acid   FISH OIL-OMEGA-3 FATTY ACIDS 1000 MG CAPSULE    Take 2 g by mouth daily.   HYDROCHLOROTHIAZIDE (HYDRODIURIL) 25 MG TABLET    Take 1 tablet (25 mg total) by mouth daily. To control swelling / blood pressure   LEVOTHYROXINE (SYNTHROID, LEVOTHROID) 125 MCG TABLET    Take 1 tablet (125 mcg total) by mouth daily before breakfast.   LORAZEPAM (ATIVAN) 1 MG TABLET    Take 1 mg by mouth 3 (three) times daily as needed for anxiety.   METFORMIN (GLUCOPHAGE-XR) 500 MG 24 HR TABLET    Take 500 mg by mouth 2 (two) times daily. To control blood sugar   ONE TOUCH ULTRA TEST TEST STRIP    CHECK BLOOD GLUCOSE AS DIRECTED   RESVERATROL PO    Take by mouth. WITH D    VALACYCLOVIR (VALTREX) 1000 MG TABLET    Take 1,000 mg by mouth 2 (two) times daily. To help resolve viral blisters  Modified Medications   No medications on file  Discontinued Medications   ATORVASTATIN (LIPITOR) 10 MG TABLET    Take 10 mg by mouth daily. To control cholesterol     Review of Systems  Constitutional: Positive for fatigue. Negative for fever, chills, activity change and appetite change.  HENT: Negative.   Eyes: Negative.   Respiratory: Negative.   Cardiovascular:  Negative.   Gastrointestinal:       Epigastric discomfort  Endocrine: Negative.   Genitourinary: Negative.   Musculoskeletal: Negative.   Skin: Negative.   Allergic/Immunologic: Negative.   Neurological:       Single episode of left facial hemispasm on 12/16/13.  Hematological: Negative.   Psychiatric/Behavioral:       Chronic stress    Filed Vitals:   01/24/14 1258  BP: 138/90  Pulse: 62  Resp: 10  Height: 5' 9"  (1.753 m)  Weight: 164 lb 3.2 oz (74.481 kg)  SpO2: 98%   Body mass index is 24.24 kg/(m^2).  Physical Exam  Constitutional: She is oriented to person, place, and time. She appears well-developed and well-nourished. No distress.  HENT:  Nose: Nose normal.  Mouth/Throat: No oropharyngeal exudate.  Eyes: Conjunctivae and EOM are  normal. Pupils are equal, round, and reactive to light. Left eye exhibits no discharge.  Neck: No JVD present. No tracheal deviation present. No thyromegaly present.  Cardiovascular: Normal rate, regular rhythm, normal heart sounds and intact distal pulses.  Exam reveals no gallop and no friction rub.   No murmur heard. Pulmonary/Chest: No respiratory distress. She has no wheezes. She has no rales.  Abdominal: She exhibits no distension and no mass. There is no tenderness.  Musculoskeletal: Normal range of motion. She exhibits no edema and no tenderness.  Lymphadenopathy:    She has no cervical adenopathy.  Neurological: She is alert and oriented to person, place, and time. She has normal reflexes. No cranial nerve deficit. Coordination normal.  Skin: No rash noted. No erythema. No pallor.  Psychiatric: She has a normal mood and affect. Her behavior is normal. Judgment and thought content normal.     Labs reviewed: Appointment on 01/22/2014  Component Date Value Ref Range Status  . Cholesterol, Total 01/22/2014 152  100 - 199 mg/dL Final  . Triglycerides 01/22/2014 97  0 - 149 mg/dL Final  . HDL 01/22/2014 50  >39 mg/dL Final   Comment: According to ATP-III Guidelines, HDL-C >59 mg/dL is considered a                          negative risk factor for CHD.  Marland Kitchen VLDL Cholesterol Cal 01/22/2014 19  5 - 40 mg/dL Final  . LDL Calculated 01/22/2014 83  0 - 99 mg/dL Final  . Chol/HDL Ratio 01/22/2014 3.0  0.0 - 4.4 ratio units Final   Comment:                                   T. Chol/HDL Ratio                                                                      Men  Women                                                        1/2 Avg.Risk  3.4    3.3  Avg.Risk  5.0    4.4                                                         2X Avg.Risk  9.6    7.1                                                         3X Avg.Risk 23.4   11.0  . TSH  01/22/2014 2.980  0.450 - 4.500 uIU/mL Final  . Glucose 01/22/2014 101* 65 - 99 mg/dL Final  . BUN 01/22/2014 20  8 - 27 mg/dL Final  . Creatinine, Ser 01/22/2014 0.82  0.57 - 1.00 mg/dL Final  . GFR calc non Af Amer 01/22/2014 75  >59 mL/min/1.73 Final  . GFR calc Af Amer 01/22/2014 87  >59 mL/min/1.73 Final  . BUN/Creatinine Ratio 01/22/2014 24  11 - 26 Final  . Sodium 01/22/2014 142  134 - 144 mmol/L Final  . Potassium 01/22/2014 3.7  3.5 - 5.2 mmol/L Final  . Chloride 01/22/2014 101  97 - 108 mmol/L Final  . CO2 01/22/2014 25  18 - 29 mmol/L Final  . Calcium 01/22/2014 9.5  8.7 - 10.3 mg/dL Final  . Total Protein 01/22/2014 6.1  6.0 - 8.5 g/dL Final  . Albumin 01/22/2014 4.2  3.6 - 4.8 g/dL Final  . Globulin, Total 01/22/2014 1.9  1.5 - 4.5 g/dL Final  . Albumin/Globulin Ratio 01/22/2014 2.2  1.1 - 2.5 Final  . Total Bilirubin 01/22/2014 0.5  0.0 - 1.2 mg/dL Final  . Alkaline Phosphatase 01/22/2014 46  39 - 117 IU/L Final  . AST 01/22/2014 16  0 - 40 IU/L Final  . ALT 01/22/2014 16  0 - 32 IU/L Final  . Hemoglobin A1C 01/22/2014 6.0* 4.8 - 5.6 % Final   Comment:          Increased risk for diabetes: 5.7 - 6.4                                   Diabetes: >6.4                                   Glycemic control for adults with diabetes: <7.0  . Estimated average glucose 01/22/2014 126   Final  Abstract on 01/01/2014  Component Date Value Ref Range Status  . HM Mammogram 12/29/2013 [facility]--Mammogram: There is no mammographic evidence of malignancy. Routine mammographic evaluation in 1 year is recommended.   Final  Admission on 12/16/2013, Discharged on 12/16/2013  Component Date Value Ref Range Status  . WBC 12/16/2013 6.8  4.0 - 10.5 K/uL Final  . RBC 12/16/2013 4.30  3.87 - 5.11 MIL/uL Final  . Hemoglobin 12/16/2013 13.6  12.0 - 15.0 g/dL Final  . HCT 12/16/2013 40.5  36.0 - 46.0 % Final  . MCV 12/16/2013 94.2  78.0 - 100.0 fL Final  . MCH 12/16/2013 31.6  26.0 - 34.0 pg Final  .  MCHC 12/16/2013 33.6  30.0 - 36.0 g/dL Final  . RDW 12/16/2013 12.1  11.5 - 15.5 % Final  . Platelets 12/16/2013 191  150 - 400 K/uL Final  . Sodium 12/16/2013 141  137 - 147 mEq/L Final  . Potassium 12/16/2013 3.9  3.7 - 5.3 mEq/L Final  . Chloride 12/16/2013 98  96 - 112 mEq/L Final  . CO2 12/16/2013 26  19 - 32 mEq/L Final  . Glucose, Bld 12/16/2013 135* 70 - 99 mg/dL Final  . BUN 12/16/2013 21  6 - 23 mg/dL Final  . Creatinine, Ser 12/16/2013 0.80  0.50 - 1.10 mg/dL Final  . Calcium 12/16/2013 9.3  8.4 - 10.5 mg/dL Final  . GFR calc non Af Amer 12/16/2013 76* >90 mL/min Final  . GFR calc Af Amer 12/16/2013 88* >90 mL/min Final   Comment: (NOTE)                          The eGFR has been calculated using the CKD EPI equation.                          This calculation has not been validated in all clinical situations.                          eGFR's persistently <90 mL/min signify possible Chronic Kidney                          Disease.  . Anion gap 12/16/2013 17* 5 - 15 Final  . Color, Urine 12/16/2013 YELLOW  YELLOW Final  . APPearance 12/16/2013 CLEAR  CLEAR Final  . Specific Gravity, Urine 12/16/2013 1.019  1.005 - 1.030 Final  . pH 12/16/2013 7.5  5.0 - 8.0 Final  . Glucose, UA 12/16/2013 NEGATIVE  NEGATIVE mg/dL Final  . Hgb urine dipstick 12/16/2013 NEGATIVE  NEGATIVE Final  . Bilirubin Urine 12/16/2013 NEGATIVE  NEGATIVE Final  . Ketones, ur 12/16/2013 NEGATIVE  NEGATIVE mg/dL Final  . Protein, ur 12/16/2013 NEGATIVE  NEGATIVE mg/dL Final  . Urobilinogen, UA 12/16/2013 0.2  0.0 - 1.0 mg/dL Final  . Nitrite 12/16/2013 NEGATIVE  NEGATIVE Final  . Leukocytes, UA 12/16/2013 MODERATE* NEGATIVE Final  . Squamous Epithelial / LPF 12/16/2013 RARE  RARE Final  . WBC, UA 12/16/2013 3-6  <3 WBC/hpf Final  . Bacteria, UA 12/16/2013 MANY* RARE Final  Appointment on 10/30/2013  Component Date Value Ref Range Status  . Hemoglobin A1C 10/25/2013 6.0* 4.8 - 5.6 % Final   Comment:           Increased risk for diabetes: 5.7 - 6.4                                   Diabetes: >6.4                                   Glycemic control for adults with diabetes: <7.0  . Estimated average glucose 10/25/2013 126   Final  . TSH 10/25/2013 2.630  0.450 - 4.500 uIU/mL Final  Office Visit on 10/25/2013  Component Date Value Ref Range Status  . Glucose 10/25/2013 104* 65 - 99 mg/dL Final  . BUN 10/25/2013 16  8 - 27 mg/dL Final  .  Creatinine, Ser 10/25/2013 0.80  0.57 - 1.00 mg/dL Final  . GFR calc non Af Amer 10/25/2013 78  >59 mL/min/1.73 Final  . GFR calc Af Amer 10/25/2013 90  >59 mL/min/1.73 Final  . BUN/Creatinine Ratio 10/25/2013 20  11 - 26 Final  . Sodium 10/25/2013 144  134 - 144 mmol/L Final  . Potassium 10/25/2013 3.9  3.5 - 5.2 mmol/L Final  . Chloride 10/25/2013 102  97 - 108 mmol/L Final  . CO2 10/25/2013 27  18 - 29 mmol/L Final  . Calcium 10/25/2013 9.3  8.7 - 10.3 mg/dL Final  . Total Protein 10/25/2013 6.1  6.0 - 8.5 g/dL Final  . Albumin 10/25/2013 3.9  3.6 - 4.8 g/dL Final  . Globulin, Total 10/25/2013 2.2  1.5 - 4.5 g/dL Final  . Albumin/Globulin Ratio 10/25/2013 1.8  1.1 - 2.5 Final  . Total Bilirubin 10/25/2013 0.6  0.0 - 1.2 mg/dL Final  . Alkaline Phosphatase 10/25/2013 52  39 - 117 IU/L Final  . AST 10/25/2013 17  0 - 40 IU/L Final  . ALT 10/25/2013 19  0 - 32 IU/L Final  . Lipase 10/25/2013 34  0 - 59 U/L Final  . Amylase 10/25/2013 51  31 - 124 U/L Final  . WBC 10/25/2013 8.7  3.4 - 10.8 x10E3/uL Final  . RBC 10/25/2013 4.44  3.77 - 5.28 x10E6/uL Final  . Hemoglobin 10/25/2013 13.8  11.1 - 15.9 g/dL Final  . HCT 10/25/2013 42.0  34.0 - 46.6 % Final  . MCV 10/25/2013 95  79 - 97 fL Final  . MCH 10/25/2013 31.1  26.6 - 33.0 pg Final  . MCHC 10/25/2013 32.9  31.5 - 35.7 g/dL Final  . RDW 10/25/2013 12.6  12.3 - 15.4 % Final  . Platelets 10/25/2013 252  150 - 379 x10E3/uL Final  . Neutrophils Relative % 10/25/2013 54   Final  . Lymphs 10/25/2013 34    Final  . Monocytes 10/25/2013 5   Final  . Eos 10/25/2013 7   Final  . Basos 10/25/2013 0   Final  . Neutrophils Absolute 10/25/2013 4.7  1.4 - 7.0 x10E3/uL Final  . Lymphocytes Absolute 10/25/2013 3.0  0.7 - 3.1 x10E3/uL Final  . Monocytes Absolute 10/25/2013 0.5  0.1 - 0.9 x10E3/uL Final  . Eosinophils Absolute 10/25/2013 0.6* 0.0 - 0.4 x10E3/uL Final  . Basophils Absolute 10/25/2013 0.0  0.0 - 0.2 x10E3/uL Final  . Immature Granulocytes 10/25/2013 0   Final  . Immature Grans (Abs) 10/25/2013 0.0  0.0 - 0.1 x10E3/uL Final    Assessment/Plan  1. HYPERTENSION - Basic metabolic panel; Future  2. Type II or unspecified type diabetes mellitus without mention of complication, not stated as uncontrolled - Microalbumin / creatinine urine ratio - Hemoglobin A1c; Future - Basic metabolic panel; Future  3. Unspecified hypothyroidism compensated  4. Hemifacial spasm - LORazepam (ATIVAN) 0.5 MG tablet; Take 1 tablet (0.5 mg total) by mouth 2 (two) times daily as needed for anxiety.  Dispense: 90 tablet; Refill: 1  5. Anxiety state, unspecified - LORazepam (ATIVAN) 0.5 MG tablet; Take 1 tablet (0.5 mg total) by mouth 2 (two) times daily as needed for anxiety.  Dispense: 90 tablet; Refill: 1  6. Epigastric pain resolved  7. Other and unspecified hyperlipidemia controlled

## 2014-01-25 LAB — MICROALBUMIN / CREATININE URINE RATIO: Creatinine, Ur: 40 mg/dL (ref 15.0–278.0)

## 2014-02-28 ENCOUNTER — Telehealth: Payer: Self-pay | Admitting: *Deleted

## 2014-02-28 MED ORDER — MECLIZINE HCL 25 MG PO TABS: ORAL_TABLET | ORAL | Status: DC

## 2014-02-28 NOTE — Telephone Encounter (Signed)
Patient Notified and faxed Rx to pharmacy. 

## 2014-02-28 NOTE — Telephone Encounter (Signed)
Patient called and stated that she woke up feeling dizzy at 4am and then woke up again at 5am and the room was spinning when she sat up. Vitals BP 165/100 and BS 117. Please Advise.  Cell: [phone]

## 2014-02-28 NOTE — Addendum Note (Signed)
Addended by: Rafael Bihari A on: 02/28/2014 10:53 AM   Modules accepted: Orders

## 2014-02-28 NOTE — Telephone Encounter (Signed)
The symptoms are due to an inner ear problem. They should subside. Call in a prescription for Antivert 25 mg, 30 tablets, to be used one tablet every 6 hours if needed for dizziness.

## 2014-03-06 ENCOUNTER — Telehealth: Payer: Self-pay

## 2014-03-06 ENCOUNTER — Telehealth: Payer: Self-pay | Admitting: *Deleted

## 2014-03-06 MED ORDER — DIAZEPAM 5 MG PO TABS: ORAL_TABLET | ORAL | Status: DC

## 2014-03-06 NOTE — Telephone Encounter (Signed)
Called patient at home and cell numbers, left message: Dr. Nyoka Cowden wrote a Rx for Diazepam, If this does not work, will need appt with Dr. Nyoka Cowden  to teach her maneuver for BPPV. Rx at the front desk drawer.

## 2014-03-06 NOTE — Telephone Encounter (Signed)
Rx to be picked up

## 2014-03-06 NOTE — Telephone Encounter (Signed)
Patient stated that she has taken 3 of the Meclizine for being dizzy and had a reaction to it. It causes her legs to jump and jerk. Medication is still not working and she is still staying dizzy all the time. Please Advise.

## 2014-03-14 ENCOUNTER — Encounter: Payer: Self-pay | Admitting: Internal Medicine

## 2014-03-14 ENCOUNTER — Ambulatory Visit (INDEPENDENT_AMBULATORY_CARE_PROVIDER_SITE_OTHER): Payer: Medicare Other | Admitting: Internal Medicine

## 2014-03-14 VITALS — BP 140/82 | HR 64 | Resp 10 | Ht 69.0 in | Wt 165.0 lb

## 2014-03-14 DIAGNOSIS — Z23 Encounter for immunization: Secondary | ICD-10-CM

## 2014-03-14 DIAGNOSIS — H8110 Benign paroxysmal vertigo, unspecified ear: Secondary | ICD-10-CM | POA: Insufficient documentation

## 2014-03-14 NOTE — Progress Notes (Signed)
Patient ID: Kelly Fletcher, female   DOB: 10/10/1950, 63 y.o.   MRN: [id number]    Facility  [facility]    Place of Service:   OFFICE   Allergies  Allergen Reactions  . Ibuprofen   . Latex   . Naproxen   . Nitrofurantoin Monohyd Macro   . Paroxetine Hcl   . Prednisone   . Prozac [Fluoxetine Hcl]     Chief Complaint  Patient presents with  . Acute Visit    Vertigo, took meclizine- did not help. Now taking valium for relief     HPI:  Pt reports awoke with dizziness, felt like the room was spinning.  Has had ringing and popping in left ear. When tilts head toward right shoulder dizziness worsens and feels sensation of left eye shaking. No associated nausea. Took Meclizine 3 times without relief. Next tried Valium has had moderate relief. Reports symptoms improve at night.    Medications: Patient's Medications  New Prescriptions   No medications on file  Previous Medications   ALUM HYDROXIDE-MAG CARBONATE (GAVISCON PO)    Take by mouth daily.   ATORVASTATIN (LIPITOR) 10 MG TABLET    ONE DAILY TO CONTROL CHOLESTEROL   CHOLECALCIFEROL (VITAMIN D) 1000 UNITS TABLET    Take 1,000 Units by mouth daily.   CO-ENZYME Q-10 30 MG CAPSULE    Take 30 mg by mouth 3 (three) times daily.   DIAZEPAM (VALIUM) 2 MG TABLET    Take 2 mg by mouth every 6 (six) hours as needed (For dizziness).   ESOMEPRAZOLE (NEXIUM) 40 MG CAPSULE    Take 1 capsule (40 mg total) by mouth daily at 12 noon. To reduce stomach acid   FISH OIL-OMEGA-3 FATTY ACIDS 1000 MG CAPSULE    Take 2 g by mouth daily.   HYDROCHLOROTHIAZIDE (HYDRODIURIL) 25 MG TABLET    Take 1 tablet (25 mg total) by mouth daily. To control swelling / blood pressure   LEVOTHYROXINE (SYNTHROID, LEVOTHROID) 125 MCG TABLET    Take 1 tablet (125 mcg total) by mouth daily before breakfast.   LORAZEPAM (ATIVAN) 0.5 MG TABLET    Take 1 tablet (0.5 mg total) by mouth 2 (two) times daily as needed for anxiety.   METFORMIN (GLUCOPHAGE-XR) 500 MG 24 HR TABLET    Take  500 mg by mouth 2 (two) times daily. To control blood sugar   ONE TOUCH ULTRA TEST TEST STRIP    CHECK BLOOD GLUCOSE AS DIRECTED   RESVERATROL PO    Take by mouth. WITH D    VALACYCLOVIR (VALTREX) 1000 MG TABLET    Take 1,000 mg by mouth 2 (two) times daily. To help resolve viral blisters  Modified Medications   No medications on file  Discontinued Medications   CLINDAMYCIN (CLEOCIN) 300 MG CAPSULE    Take 300 mg by mouth 3 (three) times daily.   DIAZEPAM (VALIUM) 5 MG TABLET    Take 35m one every 6 hours if needed for dizziness   MECLIZINE (ANTIVERT) 25 MG TABLET    Take one tablet by mouth every 6 hours as needed for dizziness     Review of Systems  Constitutional: Positive for fatigue. Negative for fever, chills, activity change and appetite change.  HENT: Positive for dental problem (Recent extensive dental work ) and tinnitus. Negative for ear discharge and ear pain.   Eyes: Negative.  Negative for visual disturbance.  Respiratory: Negative.   Cardiovascular: Negative.   Gastrointestinal: Negative.  Epigastric discomfort  Endocrine: Negative.   Genitourinary: Negative.   Musculoskeletal: Negative.   Skin: Negative.   Allergic/Immunologic: Negative.   Neurological: Positive for dizziness.       Decreased balance with ambulation. Single episode of left facial hemispasm on 12/16/13.  Hematological: Negative.   Psychiatric/Behavioral:       Chronic stress    Filed Vitals:   03/14/14 1333  BP: 140/82  Pulse: 64  Resp: 10  Height: 5' 9"  (1.753 m)  Weight: 165 lb (74.844 kg)   Body mass index is 24.36 kg/(m^2).  Physical Exam  Constitutional: She is oriented to person, place, and time. She appears well-developed and well-nourished. No distress.  HENT:  Ears:  Nose: Nose normal.  Mouth/Throat: No oropharyngeal exudate.  Eyes: Conjunctivae and EOM are normal. Pupils are equal, round, and reactive to light. Left eye exhibits no discharge. Right eye exhibits no  nystagmus. Left eye exhibits no nystagmus.  Neck: No JVD present. No tracheal deviation present. No thyromegaly present.  Cardiovascular: Normal rate, regular rhythm, normal heart sounds and intact distal pulses.  Exam reveals no gallop and no friction rub.   No murmur heard. Pulmonary/Chest: Effort normal and breath sounds normal. No respiratory distress. She has no wheezes. She has no rales.  Abdominal: She exhibits no distension and no mass. There is no tenderness.  Musculoskeletal: Normal range of motion. She exhibits no edema and no tenderness.  Lymphadenopathy:    She has no cervical adenopathy.  Neurological: She is alert and oriented to person, place, and time. She has normal reflexes. No cranial nerve deficit. Coordination normal.  Skin: No rash noted. No erythema. No pallor.  Psychiatric: She has a normal mood and affect. Her behavior is normal. Judgment and thought content normal.     Labs reviewed: Office Visit on 01/24/2014  Component Date Value Ref Range Status  . Creatinine, Ur 01/24/2014 40.0  15.0 - 278.0 mg/dL Final  . Microalbum.,U,Random 01/24/2014 <3.0  0.0 - 17.0 ug/mL Final   **Verified by repeat analysis**  . MICROALB/CREAT RATIO 01/24/2014 <7.5  0.0 - 30.0 mg/g creat Final  Appointment on 01/22/2014  Component Date Value Ref Range Status  . Cholesterol, Total 01/22/2014 152  100 - 199 mg/dL Final  . Triglycerides 01/22/2014 97  0 - 149 mg/dL Final  . HDL 01/22/2014 50  >39 mg/dL Final   Comment: According to ATP-III Guidelines, HDL-C >59 mg/dL is considered a                          negative risk factor for CHD.  Marland Kitchen VLDL Cholesterol Cal 01/22/2014 19  5 - 40 mg/dL Final  . LDL Calculated 01/22/2014 83  0 - 99 mg/dL Final  . Chol/HDL Ratio 01/22/2014 3.0  0.0 - 4.4 ratio units Final   Comment:                                   T. Chol/HDL Ratio                                                                      Men  Women  1/2 Avg.Risk  3.4    3.3                                                            Avg.Risk  5.0    4.4                                                         2X Avg.Risk  9.6    7.1                                                         3X Avg.Risk 23.4   11.0  . TSH 01/22/2014 2.980  0.450 - 4.500 uIU/mL Final  . Glucose 01/22/2014 101* 65 - 99 mg/dL Final  . BUN 01/22/2014 20  8 - 27 mg/dL Final  . Creatinine, Ser 01/22/2014 0.82  0.57 - 1.00 mg/dL Final  . GFR calc non Af Amer 01/22/2014 75  >59 mL/min/1.73 Final  . GFR calc Af Amer 01/22/2014 87  >59 mL/min/1.73 Final  . BUN/Creatinine Ratio 01/22/2014 24  11 - 26 Final  . Sodium 01/22/2014 142  134 - 144 mmol/L Final  . Potassium 01/22/2014 3.7  3.5 - 5.2 mmol/L Final  . Chloride 01/22/2014 101  97 - 108 mmol/L Final  . CO2 01/22/2014 25  18 - 29 mmol/L Final  . Calcium 01/22/2014 9.5  8.7 - 10.3 mg/dL Final  . Total Protein 01/22/2014 6.1  6.0 - 8.5 g/dL Final  . Albumin 01/22/2014 4.2  3.6 - 4.8 g/dL Final  . Globulin, Total 01/22/2014 1.9  1.5 - 4.5 g/dL Final  . Albumin/Globulin Ratio 01/22/2014 2.2  1.1 - 2.5 Final  . Total Bilirubin 01/22/2014 0.5  0.0 - 1.2 mg/dL Final  . Alkaline Phosphatase 01/22/2014 46  39 - 117 IU/L Final  . AST 01/22/2014 16  0 - 40 IU/L Final  . ALT 01/22/2014 16  0 - 32 IU/L Final  . Hemoglobin A1C 01/22/2014 6.0* 4.8 - 5.6 % Final   Comment:          Increased risk for diabetes: 5.7 - 6.4                                   Diabetes: >6.4                                   Glycemic control for adults with diabetes: <7.0  . Estimated average glucose 01/22/2014 126   Final  Abstract on 01/01/2014  Component Date Value Ref Range Status  . HM Mammogram 12/29/2013 [facility]--Mammogram: There is no mammographic evidence of malignancy. Routine mammographic evaluation in 1 year is recommended.   Final  Admission on 12/16/2013, Discharged on 12/16/2013  Component Date Value Ref Range Status   . WBC 12/16/2013 6.8  4.0 - 10.5 K/uL Final  . RBC 12/16/2013 4.30  3.87 - 5.11 MIL/uL Final  . Hemoglobin 12/16/2013 13.6  12.0 - 15.0 g/dL Final  . HCT 12/16/2013 40.5  36.0 - 46.0 % Final  . MCV 12/16/2013 94.2  78.0 - 100.0 fL Final  . MCH 12/16/2013 31.6  26.0 - 34.0 pg Final  . MCHC 12/16/2013 33.6  30.0 - 36.0 g/dL Final  . RDW 12/16/2013 12.1  11.5 - 15.5 % Final  . Platelets 12/16/2013 191  150 - 400 K/uL Final  . Sodium 12/16/2013 141  137 - 147 mEq/L Final  . Potassium 12/16/2013 3.9  3.7 - 5.3 mEq/L Final  . Chloride 12/16/2013 98  96 - 112 mEq/L Final  . CO2 12/16/2013 26  19 - 32 mEq/L Final  . Glucose, Bld 12/16/2013 135* 70 - 99 mg/dL Final  . BUN 12/16/2013 21  6 - 23 mg/dL Final  . Creatinine, Ser 12/16/2013 0.80  0.50 - 1.10 mg/dL Final  . Calcium 12/16/2013 9.3  8.4 - 10.5 mg/dL Final  . GFR calc non Af Amer 12/16/2013 76* >90 mL/min Final  . GFR calc Af Amer 12/16/2013 88* >90 mL/min Final   Comment: (NOTE)                          The eGFR has been calculated using the CKD EPI equation.                          This calculation has not been validated in all clinical situations.                          eGFR's persistently <90 mL/min signify possible Chronic Kidney                          Disease.  . Anion gap 12/16/2013 17* 5 - 15 Final  . Color, Urine 12/16/2013 YELLOW  YELLOW Final  . APPearance 12/16/2013 CLEAR  CLEAR Final  . Specific Gravity, Urine 12/16/2013 1.019  1.005 - 1.030 Final  . pH 12/16/2013 7.5  5.0 - 8.0 Final  . Glucose, UA 12/16/2013 NEGATIVE  NEGATIVE mg/dL Final  . Hgb urine dipstick 12/16/2013 NEGATIVE  NEGATIVE Final  . Bilirubin Urine 12/16/2013 NEGATIVE  NEGATIVE Final  . Ketones, ur 12/16/2013 NEGATIVE  NEGATIVE mg/dL Final  . Protein, ur 12/16/2013 NEGATIVE  NEGATIVE mg/dL Final  . Urobilinogen, UA 12/16/2013 0.2  0.0 - 1.0 mg/dL Final  . Nitrite 12/16/2013 NEGATIVE  NEGATIVE Final  . Leukocytes, UA 12/16/2013 MODERATE* NEGATIVE  Final  . Squamous Epithelial / LPF 12/16/2013 RARE  RARE Final  . WBC, UA 12/16/2013 3-6  <3 WBC/hpf Final  . Bacteria, UA 12/16/2013 MANY* RARE Final     Assessment/Plan  1. BPPV (benign paroxysmal positional vertigo), unspecified laterality - Continue Valium prn - Ambulatory referral to ENT

## 2014-04-02 ENCOUNTER — Encounter: Payer: Self-pay | Admitting: Internal Medicine

## 2014-04-02 ENCOUNTER — Telehealth: Payer: Self-pay | Admitting: *Deleted

## 2014-04-02 DIAGNOSIS — H8110 Benign paroxysmal vertigo, unspecified ear: Secondary | ICD-10-CM

## 2014-04-02 DIAGNOSIS — G518 Other disorders of facial nerve: Secondary | ICD-10-CM

## 2014-04-02 DIAGNOSIS — I10 Essential (primary) hypertension: Secondary | ICD-10-CM

## 2014-04-02 NOTE — Telephone Encounter (Signed)
Patient called and stated that she had 1 spasm on Friday, 6 spasms on Saturday and 5 spasms on Sunday and 3 spasms on Monday. Been going on since July. Also been having Vertigo and went to ENT and they made her a hearing test and took her off of the Valium to do this, she had to cancel her ENT appointment because she cannot stop the Valium because it worsens. One day got bumped in the mouth by the baby and had to had 2 teeth pulled and had to have bone graft done in August and she does not think she can have the transplant done due to the spasms. Patient thinks she needs a scan of the head because she feels "something is not right". Please Advise.

## 2014-04-03 NOTE — Telephone Encounter (Signed)
Schedule CT of the head with and without contrast. Also, I think a neurology consult may be in order.

## 2014-04-03 NOTE — Telephone Encounter (Signed)
Patient Notified and agreed. Placed order for referrals and informed patient that Earlie Server would call her with appointments

## 2014-04-05 ENCOUNTER — Encounter: Payer: Self-pay | Admitting: Neurology

## 2014-04-05 ENCOUNTER — Ambulatory Visit (INDEPENDENT_AMBULATORY_CARE_PROVIDER_SITE_OTHER): Payer: Medicare Other | Admitting: Neurology

## 2014-04-05 VITALS — BP 168/77 | HR 70 | Temp 98.0°F | Resp 14 | Ht 69.0 in | Wt 161.0 lb

## 2014-04-05 DIAGNOSIS — R251 Tremor, unspecified: Secondary | ICD-10-CM

## 2014-04-05 DIAGNOSIS — G5139 Clonic hemifacial spasm, unspecified: Secondary | ICD-10-CM | POA: Insufficient documentation

## 2014-04-05 DIAGNOSIS — R42 Dizziness and giddiness: Secondary | ICD-10-CM | POA: Insufficient documentation

## 2014-04-05 DIAGNOSIS — G513 Clonic hemifacial spasm: Secondary | ICD-10-CM

## 2014-04-05 HISTORY — DX: Clonic hemifacial spasm, unspecified: G51.39

## 2014-04-05 NOTE — Patient Instructions (Signed)
  Coconut water, reduce caffeine and drink more a water.  Valium not refiilled.  MRI ordered.

## 2014-04-05 NOTE — Progress Notes (Signed)
[facility]   Provider:  Larey Seat, M D  Referring Provider: Estill Dooms, MD Primary Care Physician:  Estill Dooms, MD  Chief Complaint  Patient presents with  . NP Dr. Jeanmarie Hubert    Rm 23, Husband    HPI:  Kelly Fletcher is a 63 y.o. female, seen here as a referral from Dr. Nyoka Cowden for Facial Twitching.   Paroxysmal since mid October. She has had Vertigo and was evaluated by ENT, treated with meclizine and Valium. She had several facial twitches , most in the morning when she takes her shower. The patient's husband explained that the couple is raising their granddaughter and is stressed. in the morning and we revisited her last Labs , she was in the low normal range for  K. and Na The patient has a disturbed body image , always" feels too big" .  She has dieted for much of her life, she takes HCTZ when she feels puffy and is proud that she can shed 5 pounds on a tablet.   The patient appears pleasant and highly anxious.  No facial twitching is not evident here, but the patient became tearful.      Review of Systems:  Out of a complete 14 system review, the patient complains of only the following symptoms, and all other reviewed systems are negative. Vertigo, insomnia, tremor, trembling, facial twitching.   Epworth score  , Fatigue severity score  , depression score were not obtained.    History   Social History  . Marital Status: Married    Spouse Name: N/A    Number of Children: N/A  . Years of Education: N/A   Occupational History  . Not on file.   Social History Main Topics  . Smoking status: Never Smoker   . Smokeless tobacco: Never Used  . Alcohol Use: No  . Drug Use: No  . Sexual Activity: No   Other Topics Concern  . Not on file   Social History Narrative   Right handed, married 1 kids, taking care of granddaughter, caffeine 2 cups in am , soda in pm, [employer] grad.    Family History  Problem Relation Age of Onset  . Hypertension  Mother   . Breast cancer Mother 15  . Heart disease Father   . Stroke Father   . Hypertension Sister   . Heart disease Sister   . Osteoporosis Sister   . Diabetes Brother   . Hypertension Brother   . Heart disease Brother   . Other Brother     BRAIN TUMOR  . Cancer Brother     Eye cancer-Melanoma  . Diabetes Brother   . Heart disease Brother   . Heart attack Sister   . Heart disease Brother   . Heart attack Brother     Past Medical History  Diagnosis Date  . Uterine prolapse   . Cystocele   . GERD (gastroesophageal reflux disease)   . Elevated cholesterol   . Rectocele   . Hypertension   . Arthritis   . Type II or unspecified type diabetes mellitus without mention of complication, uncontrolled     Type 2  . Urinary incontinence     Urgency  . Anxiety state, unspecified   . Palpitations   . Allergic rhinitis, cause unspecified   . Other and unspecified hyperlipidemia   . Unspecified hypothyroidism   . Edema   . Insomnia, unspecified   . Depressive disorder, not elsewhere classified   .  Unspecified urinary incontinence   . Peptic ulcer, unspecified site, unspecified as acute or chronic, without mention of hemorrhage, perforation, or obstruction   . Disturbance of skin sensation   . Calculus of kidney     Past Surgical History  Procedure Laterality Date  . Tonsillectomy and adenoidectomy  1963  . Dilation and curettage of uterus      3-05  . Hysteroscopy      3-05  . Endometrial biopsy      Dr.Gottsegen   . Tongue biopsy      Current Outpatient Prescriptions  Medication Sig Dispense Refill  . Alum Hydroxide-Mag Carbonate (GAVISCON PO) Take by mouth daily as needed.     Marland Kitchen atorvastatin (LIPITOR) 10 MG tablet ONE DAILY TO CONTROL CHOLESTEROL 90 tablet 2  . cholecalciferol (VITAMIN D) 1000 UNITS tablet Take 1,000 Units by mouth daily. Takes 2000-5000mg  daily    . co-enzyme Q-10 30 MG capsule Take 30 mg by mouth daily.     . diazepam (VALIUM) 2 MG tablet Take  2 mg by mouth every 6 (six) hours as needed (For dizziness).    Marland Kitchen esomeprazole (NEXIUM) 40 MG capsule Take 1 capsule (40 mg total) by mouth daily at 12 noon. To reduce stomach acid 90 capsule 1  . fish oil-omega-3 fatty acids 1000 MG capsule Take 2 g by mouth daily.    . hydrochlorothiazide (HYDRODIURIL) 25 MG tablet Take 1 tablet (25 mg total) by mouth daily. To control swelling / blood pressure 90 tablet 1  . levothyroxine (SYNTHROID, LEVOTHROID) 125 MCG tablet Take 1 tablet (125 mcg total) by mouth daily before breakfast. 90 tablet 1  . metFORMIN (GLUCOPHAGE-XR) 500 MG 24 hr tablet Take 500 mg by mouth 2 (two) times daily. To control blood sugar    . ONE TOUCH ULTRA TEST test strip CHECK BLOOD GLUCOSE AS DIRECTED 100 each 3  . RESVERATROL PO Take by mouth. WITH D     . HYDROcodone-acetaminophen (NORCO/VICODIN) 5-325 MG per tablet   0  . valACYclovir (VALTREX) 1000 MG tablet Take 1,000 mg by mouth 2 (two) times daily. To help resolve viral blisters     No current facility-administered medications for this visit.    Allergies as of 04/05/2014 - Review Complete 04/05/2014  Allergen Reaction Noted  . Ibuprofen  12/16/2013  . Latex  07/16/2011  . Naproxen  12/12/2012  . Nitrofurantoin monohyd macro  07/16/2011  . Paroxetine hcl  07/16/2011  . Prednisone  12/12/2012  . Prozac [fluoxetine hcl]  12/12/2012    Vitals: BP 168/77 mmHg  Pulse 70  Temp(Src) 98 F (36.7 C) (Oral)  Resp 14  Ht 5\' 9"  (1.753 m)  Wt 161 lb (73.029 kg)  BMI 23.76 kg/m2 Last Weight:  Wt Readings from Last 1 Encounters:  04/05/14 161 lb (73.029 kg)       Last Height:   Ht Readings from Last 1 Encounters:  04/05/14 5\' 9"  (1.753 m)    Physical exam:  General: The patient is awake, alert and appears not in acute distress. The patient is well groomed. Head: Normocephalic, atraumatic. Neck is supple.  Mallampati 2,  neck circumference:14 inches. Nasal airflow unrestricted , TMJ is not evident . Retrognathia  is not seen.  Cardiovascular:  Regular rate and rhythm* , without  murmurs or carotid bruit, and without distended neck veins. Respiratory: Lungs are clear to auscultation. Skin:  Without evidence of edema, or rash Trunk: normal posture. Neck stiffness, tension.   Neurologic exam : The  patient is awake and alert, oriented to place and time.   Memory subjective   described as intact. There is a normal attention span & concentration ability.  Speech is fluent without dysarthria, dysphonia or aphasia. Mood and affect are appropriate.  Cranial nerves: Pupils are equal and briskly reactive to light.  Funduscopic exam without  evidence of pallor or edema. Extraocular movements  in vertical and horizontal planes intact and without nystagmus.  Visual fields by finger perimetry are intact. Hearing to finger rub intact.  Facial sensation intact to fine touch. Facial motor strength is symmetric and tongue and uvula move midline.  Motor exam: Normal tone, muscle bulk and symmetric strength in all extremities.  Sensory:  Fine touch, pinprick and vibration were tested in all extremities.  Proprioception is tested in the upper extremities only. This was normal.  Coordination: Rapid alternating movements in the fingers/hands is normal.  Finger-to-nose maneuver  normal without evidence of ataxia, dysmetria . There is a very mid low amplitude tremor in all fingers, no weakness, no rigor, no cog wheeling. No fasciculations.   Gait and station: Patient walks without assistive device and is able unassisted to climb up to the exam table. Strength within normal limits. Stance is stable and normal.  Deep tendon reflexes: in the upper and lower extremities are symmetric and intact.  Babinski maneuver response is downgoing.   Assessment:  After physical and neurologic examination, review of laboratory studies, imaging, neurophysiology testing and pre-existing records, assessment is   1) Vertigo is supressed  by Valium, Twitching is likely from nervousness, and could be from caffeine and electrolyte changes.    The patient was advised of the nature of the diagnosed tremor disorder, the treatment options and risks for general a health and wellness arising from not treating the condition.  Visit duration was 45 minutes.   Plan:  Treatment plan and additional workup :  1) Assurance , this is not ALS, PD or a movement disorder. 2) electrolyte intake  and hydration explained, coconut water . 3)  reduce or stop HCTZ. Reduce caffeine.  4) MRI brain ordered, non contrast .        Larey Seat MD  04/05/2014

## 2014-04-06 ENCOUNTER — Telehealth: Payer: Self-pay | Admitting: *Deleted

## 2014-04-06 NOTE — Telephone Encounter (Signed)
Patient called and stated that she has already saw the neurologist Dr. Brett Fairy at [facility]. They recommended a MRI. So patient did not go and have a CT.

## 2014-04-07 ENCOUNTER — Ambulatory Visit
Admission: RE | Admit: 2014-04-07 | Discharge: 2014-04-07 | Disposition: A | Discharge status: Home / Self Care | Payer: Medicare Other | Source: Ambulatory Visit | Attending: Neurology | Admitting: Neurology

## 2014-04-07 DIAGNOSIS — R42 Dizziness and giddiness: Secondary | ICD-10-CM

## 2014-04-07 DIAGNOSIS — G5139 Clonic hemifacial spasm, unspecified: Secondary | ICD-10-CM

## 2014-04-07 DIAGNOSIS — R251 Tremor, unspecified: Secondary | ICD-10-CM

## 2014-04-07 DIAGNOSIS — R202 Paresthesia of skin: Secondary | ICD-10-CM

## 2014-04-10 ENCOUNTER — Telehealth: Payer: Self-pay | Admitting: Neurology

## 2014-04-10 DIAGNOSIS — G514 Facial myokymia: Secondary | ICD-10-CM

## 2014-04-10 NOTE — Telephone Encounter (Signed)
Patient's husband calling on behalf of patient to request MRI results, please return call and advise.

## 2014-04-11 NOTE — Telephone Encounter (Signed)
Abnormal MRI reviewed with Dr Lenell Antu- there is  clear evidence of Edema in the left dorsal frontal lobe . 2 lesions enhance at the medullary cortical junction below.   I will treat the facial spasms as seizures, and order an EEG .  I will refer to PCP for primary malignancy work up.  I was unable to reach the [facility] on either provided phone number and left only a request to call me back.   Emmah Bratcher, MD

## 2014-04-11 NOTE — Telephone Encounter (Signed)
Report given to Dr. Brett Fairy.

## 2014-04-11 NOTE — Telephone Encounter (Signed)
No report for MRI seen as per yesterday or today.  Will check with Dr. Leonie Man.

## 2014-04-12 ENCOUNTER — Telehealth: Payer: Self-pay | Admitting: *Deleted

## 2014-04-12 NOTE — Telephone Encounter (Signed)
I called and spoke to pt who is in [place].  Appt recommended per Dr. Brett Fairy to discuss results of MRI.  Dr. Brett Fairy did speak to pt and pt will come in for appt when [place].  Tuesday 1030 (next week).

## 2014-04-12 NOTE — Telephone Encounter (Signed)
Discussed with Dr. Brett Fairy.

## 2014-04-12 NOTE — Telephone Encounter (Signed)
Patient is in [place].

## 2014-04-12 NOTE — Telephone Encounter (Signed)
-----   Message from Estill Dooms, MD sent at 04/12/2014 11:01 AM EST ----- Regarding: Abnormal brain scan Schedule next available appt with me. I could start early next Wed if I need to. Because of the abnormal scan, I need to do a skin survey and schedule a chest Xray.

## 2014-04-12 NOTE — Telephone Encounter (Signed)
Patient Notified and appointment scheduled for Wednesday 04/18/2014 at 11:15.

## 2014-04-12 NOTE — Telephone Encounter (Signed)
Spoke to pt and she was having another twitching episode L face and she wanted to come in tomorrow.  I relayed that 1200 and any different would call her back.

## 2014-04-12 NOTE — Telephone Encounter (Signed)
Pt is scheduled for 1200 tomorrow.  If pt decides to not come will call me back.   I relayed that other additional testing may be needed and Dr. Charolette Forward would need to order. EEG done by our office.   She is very anxious.

## 2014-04-12 NOTE — Telephone Encounter (Signed)
Pt called and LM that planning on leaving tomorrow am early to be here for the 12oo appt.

## 2014-04-12 NOTE — Telephone Encounter (Signed)
Called patient to schedule her EEG per Dr. Brett Fairy on 04/18/14, patient requested that she get a call back from [place] regarding setting up an appointment with Dr. Brett Fairy to discuss MRI results, please return her call and advise.

## 2014-04-13 ENCOUNTER — Ambulatory Visit (INDEPENDENT_AMBULATORY_CARE_PROVIDER_SITE_OTHER): Payer: Medicare Other | Admitting: Neurology

## 2014-04-13 ENCOUNTER — Encounter: Payer: Self-pay | Admitting: Neurology

## 2014-04-13 VITALS — BP 172/88 | HR 77 | Temp 98.4°F | Resp 14 | Ht 69.5 in | Wt 161.0 lb

## 2014-04-13 DIAGNOSIS — C719 Malignant neoplasm of brain, unspecified: Secondary | ICD-10-CM

## 2014-04-13 DIAGNOSIS — G936 Cerebral edema: Secondary | ICD-10-CM

## 2014-04-13 DIAGNOSIS — R930 Abnormal findings on diagnostic imaging of skull and head, not elsewhere classified: Secondary | ICD-10-CM

## 2014-04-13 DIAGNOSIS — R9089 Other abnormal findings on diagnostic imaging of central nervous system: Secondary | ICD-10-CM

## 2014-04-13 HISTORY — DX: Other abnormal findings on diagnostic imaging of central nervous system: R90.89

## 2014-04-13 MED ORDER — DEXAMETHASONE 2 MG PO TABS: 2.0000 mg | ORAL_TABLET | Freq: Two times a day (BID) | ORAL | Status: DC

## 2014-04-13 MED ORDER — CARBAMAZEPINE 200 MG PO TABS: 200.0000 mg | ORAL_TABLET | Freq: Two times a day (BID) | ORAL | Status: DC

## 2014-04-13 NOTE — Progress Notes (Signed)
[facility]   Provider:  Larey Seat, M D  Referring Provider: Estill Dooms, MD Primary Care Physician:  Estill Dooms, MD  Chief Complaint  Patient presents with  . RV MRI results    Rm 10, husband    HPI:  Kelly Fletcher is a 63 y.o. female, seen here as a referral from Dr. Nyoka Cowden for Facial Twitching.   Paroxysmal since mid October. She has had Vertigo and was evaluated by ENT, treated with meclizine and Valium. She had several facial twitches , most in the morning when she takes her shower. The patient's husband explained that the couple is raising their granddaughter and is stressed. in the morning and we revisited her last Labs , she was in the low normal range for  K. and Na The patient has a disturbed body image , always" feels too big" .  She has dieted for much of her life, she takes HCTZ when she feels puffy and is proud that she can shed 5 pounds on a tablet.   The patient appears pleasant and highly anxious.  No facial twitching is not evident here, but the patient became tearful.  04-13-14   Today is her grandaughter Haylee  third Birthday . Abnormal MRI discussed today, 2  Possible mets most likely causing the edema of the posterior part of the left frontal brain.  Primary tumor search ; Referral to chest CT, just had mamography, had endoscopy, needs pelvic CT.  PET scan to be considered. [name].  Local seizures most likely causing the focal twitching and drawing of the face.      Review of Systems:  Out of a complete 14 system review, the patient complains of only the following symptoms, and all other reviewed systems are negative. Vertigo, insomnia, tremor, trembling, facial twitching.   Very tearful.   History   Social History  . Marital Status: Married    Spouse Name: N/A    Number of Children: N/A  . Years of Education: N/A   Occupational History  . Not on file.   Social History Main Topics  . Smoking status: Never Smoker    . Smokeless tobacco: Never Used  . Alcohol Use: No  . Drug Use: No  . Sexual Activity: No   Other Topics Concern  . Not on file   Social History Narrative   Right handed, married 1 kids, taking care of granddaughter, caffeine 2 cups in am , soda in pm, [employer] grad.    Family History  Problem Relation Age of Onset  . Hypertension Mother   . Breast cancer Mother 67  . Heart disease Father   . Stroke Father   . Hypertension Sister   . Heart disease Sister   . Osteoporosis Sister   . Diabetes Brother   . Hypertension Brother   . Heart disease Brother   . Other Brother     BRAIN TUMOR  . Cancer Brother     Eye cancer-Melanoma  . Diabetes Brother   . Heart disease Brother   . Heart attack Sister   . Heart disease Brother   . Heart attack Brother     Past Medical History  Diagnosis Date  . Uterine prolapse   . Cystocele   . GERD (gastroesophageal reflux disease)   . Elevated cholesterol   . Rectocele   . Hypertension   . Arthritis   . Type II or unspecified type diabetes mellitus without mention of complication, uncontrolled  Type 2  . Urinary incontinence     Urgency  . Anxiety state, unspecified   . Palpitations   . Allergic rhinitis, cause unspecified   . Other and unspecified hyperlipidemia   . Unspecified hypothyroidism   . Edema   . Insomnia, unspecified   . Depressive disorder, not elsewhere classified   . Unspecified urinary incontinence   . Peptic ulcer, unspecified site, unspecified as acute or chronic, without mention of hemorrhage, perforation, or obstruction   . Disturbance of skin sensation   . Calculus of kidney   . Facial spasm 04/05/2014  . Abnormal brain MRI 04/13/2014    Past Surgical History  Procedure Laterality Date  . Tonsillectomy and adenoidectomy  1963  . Dilation and curettage of uterus      3-05  . Hysteroscopy      3-05  . Endometrial biopsy      Dr.Gottsegen   . Tongue biopsy      Current Outpatient Prescriptions    Medication Sig Dispense Refill  . Alum Hydroxide-Mag Carbonate (GAVISCON PO) Take by mouth daily as needed.     Marland Kitchen atorvastatin (LIPITOR) 10 MG tablet ONE DAILY TO CONTROL CHOLESTEROL 90 tablet 2  . cholecalciferol (VITAMIN D) 1000 UNITS tablet Take 1,000 Units by mouth daily. Takes 2000-5000mg  daily    . co-enzyme Q-10 30 MG capsule Take 30 mg by mouth daily.     . diazepam (VALIUM) 2 MG tablet Take 2 mg by mouth every 6 (six) hours as needed (For dizziness).    Marland Kitchen esomeprazole (NEXIUM) 40 MG capsule Take 1 capsule (40 mg total) by mouth daily at 12 noon. To reduce stomach acid 90 capsule 1  . fish oil-omega-3 fatty acids 1000 MG capsule Take 2 g by mouth daily.    Marland Kitchen HYDROcodone-acetaminophen (NORCO/VICODIN) 5-325 MG per tablet   0  . levothyroxine (SYNTHROID, LEVOTHROID) 125 MCG tablet Take 1 tablet (125 mcg total) by mouth daily before breakfast. 90 tablet 1  . metFORMIN (GLUCOPHAGE-XR) 500 MG 24 hr tablet Take 500 mg by mouth 2 (two) times daily. To control blood sugar    . ONE TOUCH ULTRA TEST test strip CHECK BLOOD GLUCOSE AS DIRECTED 100 each 3  . RESVERATROL PO Take by mouth. WITH D     . hydrochlorothiazide (HYDRODIURIL) 25 MG tablet Take 1 tablet (25 mg total) by mouth daily. To control swelling / blood pressure 90 tablet 1  . valACYclovir (VALTREX) 1000 MG tablet Take 1,000 mg by mouth 2 (two) times daily. To help resolve viral blisters     No current facility-administered medications for this visit.    Allergies as of 04/13/2014 - Review Complete 04/13/2014  Allergen Reaction Noted  . Ibuprofen  12/16/2013  . Latex  07/16/2011  . Naproxen  12/12/2012  . Nitrofurantoin monohyd macro  07/16/2011  . Paroxetine hcl  07/16/2011  . Prednisone  12/12/2012  . Prozac [fluoxetine hcl]  12/12/2012    Vitals: BP 172/88 mmHg  Pulse 77  Temp(Src) 98.4 F (36.9 C) (Oral)  Resp 14  Ht 5' 9.5" (1.765 m)  Wt 161 lb (73.029 kg)  BMI 23.44 kg/m2 Last Weight:  Wt Readings from Last 1  Encounters:  04/13/14 161 lb (73.029 kg)       Last Height:   Ht Readings from Last 1 Encounters:  04/13/14 5' 9.5" (1.765 m)    Physical exam:  General: The patient is awake, alert and appears not in acute distress. The patient is well groomed.  Head: Normocephalic, atraumatic. Neck is supple.  Mallampati 2,  neck circumference:14 inches. Nasal airflow unrestricted , TMJ is not evident . Retrognathia is not seen.  Cardiovascular:  Regular rate and rhythm* , without  murmurs or carotid bruit, and without distended neck veins. Respiratory: Lungs are clear to auscultation. Skin:  Without evidence of edema, or rash Trunk: normal posture. Neck stiffness, tension.   Neurologic exam : The patient is awake and alert, oriented to place and time.   Memory subjective   described as intact. There is a normal attention span & concentration ability.  Speech is fluent without dysarthria, dysphonia or aphasia. Mood and affect are appropriate.  Cranial nerves: Pupils are equal and briskly reactive to light.  Funduscopic exam without  evidence of pallor or edema. Extraocular movements  in vertical and horizontal planes intact and without nystagmus.  Visual fields by finger perimetry are intact. Hearing to finger rub intact.  Facial sensation intact to fine touch- her left face is less puffy than the right.   tongue and uvula move midline.  Motor exam: Normal tone, muscle bulk and symmetric strength in all extremities.  Sensory:  Fine touch, pinprick and vibration were tested in all extremities.  Proprioception is tested in the upper extremities only. This was normal.  Coordination: Rapid alternating movements in the fingers/hands is normal.  Finger-to-nose maneuver normal without evidence of ataxia, dysmetria .  There is a very mid low amplitude tremor in all fingers, no weakness, no rigor, no cog wheeling. No fasciculations.   Gait and station: Patient walks without assistive device and is able  unassisted to climb up to the exam table. Strength within normal limits. Stance is stable and normal.  Deep tendon reflexes: in the upper and lower extremities are symmetric and intact.  Babinski maneuver response is downgoing.   Assessment:  After physical and neurologic examination, review of laboratory studies, imaging, neurophysiology testing and pre-existing records, assessment is   Continue anti-seizure medication. Start decadron 2 mg in AM po. Has a Wednesday appointment with Dr Nyoka Cowden, PCP.  CT chest will be obtained, pelvic exam through Dr. Phineas Real.       Asencion Partridge Abdinasir Spadafore MD  04/13/2014

## 2014-04-13 NOTE — Patient Instructions (Signed)
Dexamethasone tablets What is this medicine? DEXAMETHASONE (dex a METH a sone) is a corticosteroid. It is commonly used to treat inflammation of the skin, joints, lungs, and other organs. Common conditions treated include asthma, allergies, and arthritis. It is also used for other conditions, such as blood disorders and diseases of the adrenal glands. This medicine may be used for other purposes; ask your health care provider or pharmacist if you have questions. COMMON BRAND NAME(S): Decadron, DexPak Jr TaperPak, DexPak TaperPak, Zema-Pak What should I tell my health care provider before I take this medicine? They need to know if you have any of these conditions: -Cushing's syndrome -diabetes -glaucoma -heart problems or disease -high blood pressure -infection like herpes, measles, tuberculosis, or chickenpox -kidney disease -liver disease -mental problems -myasthenia gravis -osteoporosis -previous heart attack -seizures -stomach, ulcer or intestine disease including colitis and diverticulitis -thyroid problem -an unusual or allergic reaction to dexamethasone, corticosteroids, other medicines, lactose, foods, dyes, or preservatives -pregnant or trying to get pregnant -breast-feeding How should I use this medicine? Take this medicine by mouth with a drink of water. Follow the directions on the prescription label. Take it with food or milk to avoid stomach upset. If you are taking this medicine once a day, take it in the morning. Do not take more medicine than you are told to take. Do not suddenly stop taking your medicine because you may develop a severe reaction. Your doctor will tell you how much medicine to take. If your doctor wants you to stop the medicine, the dose may be slowly lowered over time to avoid any side effects. Talk to your pediatrician regarding the use of this medicine in children. Special care may be needed. Patients over 65 years old may have a stronger reaction and  need a smaller dose. Overdosage: If you think you have taken too much of this medicine contact a poison control center or emergency room at once. NOTE: This medicine is only for you. Do not share this medicine with others. What if I miss a dose? If you miss a dose, take it as soon as you can. If it is almost time for your next dose, talk to your doctor or health care professional. You may need to miss a dose or take an extra dose. Do not take double or extra doses without advice. What may interact with this medicine? Do not take this medicine with any of the following medications: -mifepristone, RU-486 -vaccines This medicine may also interact with the following medications: -amphotericin B -antibiotics like clarithromycin, erythromycin, and troleandomycin -aspirin and aspirin-like drugs -barbiturates like phenobarbital -carbamazepine -cholestyramine -cholinesterase inhibitors like donepezil, galantamine, rivastigmine, and tacrine -cyclosporine -digoxin -diuretics -ephedrine -female hormones, like estrogens or progestins and birth control pills -indinavir -isoniazid -ketoconazole -medicines for diabetes -medicines that improve muscle tone or strength for conditions like myasthenia gravis -NSAIDs, medicines for pain and inflammation, like ibuprofen or naproxen -phenytoin -rifampin -thalidomide -warfarin This list may not describe all possible interactions. Give your health care provider a list of all the medicines, herbs, non-prescription drugs, or dietary supplements you use. Also tell them if you smoke, drink alcohol, or use illegal drugs. Some items may interact with your medicine. What should I watch for while using this medicine? Visit your doctor or health care professional for regular checks on your progress. If you are taking this medicine over a prolonged period, carry an identification card with your name and address, the type and dose of your medicine, and your doctor's  name   and address. This medicine may increase your risk of getting an infection. Stay away from people who are sick. Tell your doctor or health care professional if you are around anyone with measles or chickenpox. If you are going to have surgery, tell your doctor or health care professional that you have taken this medicine within the last twelve months. Ask your doctor or health care professional about your diet. You may need to lower the amount of salt you eat. The medicine can increase your blood sugar. If you are a diabetic check with your doctor if you need help adjusting the dose of your diabetic medicine. What side effects may I notice from receiving this medicine? Side effects that you should report to your doctor or health care professional as soon as possible: -allergic reactions like skin rash, itching or hives, swelling of the face, lips, or tongue -changes in vision -fever, sore throat, sneezing, cough, or other signs of infection, wounds that will not heal -increased thirst -mental depression, mood swings, mistaken feelings of Earll importance or of being mistreated -pain in hips, back, ribs, arms, shoulders, or legs -redness, blistering, peeling or loosening of the skin, including inside the mouth -trouble passing urine or change in the amount of urine -swelling of feet or lower legs -unusual bleeding or bruising Side effects that usually do not require medical attention (report to your doctor or health care professional if they continue or are bothersome): -headache -nausea, vomiting -skin problems, acne, thin and shiny skin -weight gain This list may not describe all possible side effects. Call your doctor for medical advice about side effects. You may report side effects to FDA at 1-800-FDA-1088. Where should I keep my medicine? Keep out of the reach of children. Store at room temperature between 20 and 25 degrees C (68 and 77 degrees F). Protect from light. Throw away any  unused medicine after the expiration date. NOTE: This sheet is a summary. It may not cover all possible information. If you have questions about this medicine, talk to your doctor, pharmacist, or health care provider.  2015, Elsevier/Gold Standard. (2007-09-08 14:02:13) Carbamazepine tablets What is this medicine? CARBAMAZEPINE (kar ba MAZ e peen) is used to control seizures caused by certain types of epilepsy. This medicine is also used to treat nerve related pain. It is not for common aches and pains. This medicine may be used for other purposes; ask your health care provider or pharmacist if you have questions. COMMON BRAND NAME(S): Epitol, Tegretol What should I tell my health care provider before I take this medicine? They need to know if you have any of these conditions: -Asian ancestry -bone marrow disease -glaucoma -heart disease or irregular heartbeat -kidney disease -liver disease -low blood counts, like low white cell, platelet, or red cell counts -porphyria -psychotic disorders -suicidal thoughts, plans, or attempt; a previous suicide attempt by you or a family member -an unusual or allergic reaction to carbamazepine, tricyclic antidepressants, phenytoin, phenobarbital or other medicines, foods, dyes, or preservatives -pregnant or trying to get pregnant -breast-feeding How should I use this medicine? Take this medicine by mouth with a glass of water. Follow the directions on the prescription label. Take this medicine with food. Take your doses at regular intervals. Do not take your medicine more often than directed. Do not stop taking this medicine except on the advice of your doctor or health care professional. A special MedGuide will be given to you by the pharmacist with each prescription and refill. Be sure to  read this information carefully each time. Talk to your pediatrician regarding the use of this medicine in children. Special care may be needed. Overdosage: If you  think you have taken too much of this medicine contact a poison control center or emergency room at once. NOTE: This medicine is only for you. Do not share this medicine with others. What if I miss a dose? If you miss a dose, take it as soon as you can. If it is almost time for your next dose, take only that dose. Do not take double or extra doses. What may interact with this medicine? Do not take this medicine with any of the following medications: -delavirdine -MAOIs like Carbex, Eldepryl, Marplan, Nardil, and Parnate -nefazodone -oxcarbazepine This medicine may also interact with the following medications: -acetaminophen -acetazolamide -barbiturate medicines for inducing sleep or treating seizures, like phenobarbital -certain antibiotics like clarithromycin, erythromycin or troleandomycin -cimetidine -cyclosporine -danazol -dicumarol -doxycycline -female hormones, including estrogens and birth control pills -grapefruit juice -isoniazid, INH -levothyroxine and other thyroid hormones -lithium and other medicines to treat mood problems or psychotic disturbances -loratadine -medicines for angina or high blood pressure -medicines for cancer -medicines for depression or anxiety -medicines for sleep -medicines to treat fungal infections, like fluconazole, itraconazole or ketoconazole -medicines used to treat HIV infection or AIDS -methadone -niacinamide -praziquantel -propoxyphene -rifampin or rifabutin -seizure or epilepsy medicine -steroid medicines such as prednisone or cortisone -theophylline -tramadol -warfarin This list may not describe all possible interactions. Give your health care provider a list of all the medicines, herbs, non-prescription drugs, or dietary supplements you use. Also tell them if you smoke, drink alcohol, or use illegal drugs. Some items may interact with your medicine. What should I watch for while using this medicine? Visit your doctor or health  care professional for a regular check on your progress. Do not change brands or dosage forms of this medicine without discussing the change with your doctor or health care professional. If you are taking this medicine for epilepsy (seizures) do not stop taking it suddenly. This increases the risk of seizures. Wear a Probation officer or necklace. Carry an identification card with information about your condition, medications, and doctor or health care professional. Dennis Bast may get drowsy, dizzy, or have blurred vision. Do not drive, use machinery, or do anything that needs mental alertness until you know how this medicine affects you. To reduce dizzy or fainting spells, do not sit or stand up quickly, especially if you are an older patient. Alcohol can increase drowsiness and dizziness. Avoid alcoholic drinks. Birth control pills may not work properly while you are taking this medicine. Talk to your doctor about using an extra method of birth control. This medicine can make you more sensitive to the sun. Keep out of the sun. If you cannot avoid being in the sun, wear protective clothing and use sunscreen. Do not use sun lamps or tanning beds/booths. The use of this medicine may increase the chance of suicidal thoughts or actions. Pay special attention to how you are responding while on this medicine. Any worsening of mood, or thoughts of suicide or dying should be reported to your health care professional right away. Women who become pregnant while using this medicine may enroll in the [place] Pregnancy Registry by calling [phone]. This registry collects information about the safety of antiepileptic drug use during pregnancy. What side effects may I notice from receiving this medicine? Side effects that you should report to your doctor or  health care professional as soon as possible: -allergic reactions like skin rash, itching or hives, swelling of the face, lips, or  tongue -breathing problems -changes in vision -confusion -dark urine -fast or irregular heartbeat -fever or chills, sore throat -mouth ulcers -pain or difficulty passing urine -redness, blistering, peeling or loosening of the skin, including inside the mouth -ringing in the ears -seizures -stomach pain -swollen joints or muscle/joint aches and pains -unusual bleeding or bruising -unusually weak or tired -vomiting -worsening of mood, thoughts or actions of suicide or dying -yellowing of the eyes or skin Side effects that usually do not require medical attention (report to your doctor or health care professional if they continue or are bothersome): -clumsiness or unsteadiness -diarrhea or constipation -headache -increased sweating -nausea This list may not describe all possible side effects. Call your doctor for medical advice about side effects. You may report side effects to FDA at 1-800-FDA-1088. Where should I keep my medicine? Keep out of reach of children. Store at room temperature below 30 degrees C (86 degrees F). Keep container tightly closed. Protect from moisture. Throw away any unused medicine after the expiration date. NOTE: This sheet is a summary. It may not cover all possible information. If you have questions about this medicine, talk to your doctor, pharmacist, or health care provider.  2015, Elsevier/Gold Standard. (2012-07-26 15:31:28)

## 2014-04-16 ENCOUNTER — Telehealth: Payer: Self-pay | Admitting: *Deleted

## 2014-04-16 DIAGNOSIS — R930 Abnormal findings on diagnostic imaging of skull and head, not elsewhere classified: Secondary | ICD-10-CM

## 2014-04-16 NOTE — Telephone Encounter (Signed)
Pt called and left message requesting ultrasound? I left message for pt to call

## 2014-04-16 NOTE — Telephone Encounter (Signed)
PT INFORMED, TRANSFERRED TO FRONT DESK, ORDER PLACED

## 2014-04-16 NOTE — Telephone Encounter (Signed)
Schedule pelvic ultrasound and exam by myself ASAP

## 2014-04-16 NOTE — Telephone Encounter (Signed)
Dr. Phineas Real Kelly Fletcher called to update you with important information see Dr.Dohmeier note 04/13/14 and MRI results.  Kelly Fletcher said Dr.Dohmeier would like her to have a pelvic exam due to results. Kelly Fletcher asked if anything else GYN should be order? Please advise

## 2014-04-18 ENCOUNTER — Encounter: Payer: Self-pay | Admitting: Internal Medicine

## 2014-04-18 ENCOUNTER — Ambulatory Visit (INDEPENDENT_AMBULATORY_CARE_PROVIDER_SITE_OTHER): Payer: Medicare Other | Admitting: Internal Medicine

## 2014-04-18 ENCOUNTER — Ambulatory Visit (INDEPENDENT_AMBULATORY_CARE_PROVIDER_SITE_OTHER): Payer: Medicare Other

## 2014-04-18 VITALS — BP 148/90 | HR 71 | Resp 10 | Ht 69.5 in | Wt 160.0 lb

## 2014-04-18 DIAGNOSIS — R930 Abnormal findings on diagnostic imaging of skull and head, not elsewhere classified: Secondary | ICD-10-CM

## 2014-04-18 DIAGNOSIS — I10 Essential (primary) hypertension: Secondary | ICD-10-CM

## 2014-04-18 DIAGNOSIS — R569 Unspecified convulsions: Secondary | ICD-10-CM

## 2014-04-18 DIAGNOSIS — G514 Facial myokymia: Secondary | ICD-10-CM

## 2014-04-18 DIAGNOSIS — R9089 Other abnormal findings on diagnostic imaging of central nervous system: Secondary | ICD-10-CM

## 2014-04-18 DIAGNOSIS — C719 Malignant neoplasm of brain, unspecified: Secondary | ICD-10-CM

## 2014-04-18 NOTE — Patient Instructions (Signed)
You will be scheduled for CT of the Chest and Pelvis

## 2014-04-18 NOTE — Progress Notes (Signed)
Patient ID: Kelly Fletcher, female   DOB: 06-19-1950, 63 y.o.   MRN: [id number]    Facility  [facility]    Place of Service:   OFFICE   Allergies  Allergen Reactions  . Ibuprofen   . Latex     Skin rash  . Naproxen   . Nitrofurantoin Monohyd Macro   . Paroxetine Hcl   . Prednisone   . Prozac [Fluoxetine Hcl]     Chief Complaint  Patient presents with  . Follow-up    Discuss MR-Brain results     HPI:  Abnormal finding on MRI of brain - there were 2 lesions with surrounding edema that are very suspicious for cancer. Dr. Brett Fairy has started her on Decadron and Tegretol. It is not clear whether this is a primary brain tumor or metastatic deposit. She does not have a known history of cancer of any organ. Her brother died of melanoma.  Essential hypertension: controlled  Seizures: now on Keppra  Cancer of brain - look for a primary site with CT Chest W Contrast, CT Abdomen Pelvis W Contrast   Medications: Patient's Medications  New Prescriptions   No medications on file  Previous Medications   ALUM HYDROXIDE-MAG CARBONATE (GAVISCON PO)    Take by mouth daily as needed.    ATORVASTATIN (LIPITOR) 10 MG TABLET    ONE DAILY TO CONTROL CHOLESTEROL   CARBAMAZEPINE (TEGRETOL) 200 MG TABLET    Take 1 tablet (200 mg total) by mouth 2 (two) times daily at 10 am and 4 pm.   CHOLECALCIFEROL (VITAMIN D) 1000 UNITS TABLET    Take 1,000 Units by mouth daily. Takes 2000-5000mg  daily   CO-ENZYME Q-10 30 MG CAPSULE    Take 30 mg by mouth daily.    DEXAMETHASONE (DECADRON) 2 MG TABLET    Take 1 tablet (2 mg total) by mouth 2 (two) times daily with a meal.   DIAZEPAM (VALIUM) 2 MG TABLET    Take 2 mg by mouth every 6 (six) hours as needed (For dizziness).   ESOMEPRAZOLE (NEXIUM) 40 MG CAPSULE    Take 1 capsule (40 mg total) by mouth daily at 12 noon. To reduce stomach acid   FISH OIL-OMEGA-3 FATTY ACIDS 1000 MG CAPSULE    Take 2 g by mouth daily.   HYDROCHLOROTHIAZIDE (HYDRODIURIL) 25 MG TABLET     Take 1 tablet (25 mg total) by mouth daily. To control swelling / blood pressure   HYDROCODONE-ACETAMINOPHEN (NORCO/VICODIN) 5-325 MG PER TABLET       LEVOTHYROXINE (SYNTHROID, LEVOTHROID) 125 MCG TABLET    Take 1 tablet (125 mcg total) by mouth daily before breakfast.   METFORMIN (GLUCOPHAGE-XR) 500 MG 24 HR TABLET    Take 500 mg by mouth 2 (two) times daily. To control blood sugar   ONE TOUCH ULTRA TEST TEST STRIP    CHECK BLOOD GLUCOSE AS DIRECTED   RESVERATROL PO    Take by mouth. WITH D    VALACYCLOVIR (VALTREX) 1000 MG TABLET    Take 1,000 mg by mouth 2 (two) times daily. To help resolve viral blisters  Modified Medications   No medications on file  Discontinued Medications   No medications on file     Review of Systems  Constitutional: Positive for fatigue. Negative for fever, chills, activity change and appetite change.  HENT: Positive for tinnitus. Negative for ear discharge and ear pain.   Eyes: Negative.  Negative for visual disturbance.  Respiratory: Negative.   Cardiovascular: Negative.  Gastrointestinal: Negative.        Epigastric discomfort  Endocrine: Negative.   Genitourinary: Negative.   Musculoskeletal: Negative.   Skin: Negative.   Allergic/Immunologic: Negative.   Neurological: Positive for dizziness.       Decreased balance with ambulation. Single episode of left facial hemispasm on 12/16/13.  Hematological: Negative.   Psychiatric/Behavioral:       Chronic stress    Filed Vitals:   04/18/14 1115  BP: 148/90  Pulse: 71  Resp: 10  Height: 5' 9.5" (1.765 m)  Weight: 160 lb (72.576 kg)  SpO2: 97%   Body mass index is 23.3 kg/(m^2).  Physical Exam  Constitutional: She is oriented to person, place, and time. She appears well-developed and well-nourished. No distress.  HENT:  Nose: Nose normal.  Mouth/Throat: No oropharyngeal exudate.  Eyes: Conjunctivae and EOM are normal. Pupils are equal, round, and reactive to light. Left eye exhibits no  discharge. Right eye exhibits no nystagmus. Left eye exhibits no nystagmus.  Neck: No JVD present. No tracheal deviation present. No thyromegaly present.  Cardiovascular: Normal rate, regular rhythm, normal heart sounds and intact distal pulses.  Exam reveals no gallop and no friction rub.   No murmur heard. Pulmonary/Chest: Effort normal and breath sounds normal. No respiratory distress. She has no wheezes. She has no rales.  Abdominal: She exhibits no distension and no mass. There is no tenderness.  Musculoskeletal: Normal range of motion. She exhibits no edema or tenderness.  Lymphadenopathy:    She has no cervical adenopathy.  Neurological: She is alert and oriented to person, place, and time. She has normal reflexes. No cranial nerve deficit. Coordination normal.  Skin: No rash noted. No erythema. No pallor.  3 benign appearing moles on labia. No areas suggest melanoma.  Psychiatric: She has a normal mood and affect. Her behavior is normal. Judgment and thought content normal.     Labs reviewed: Office Visit on 01/24/2014  Component Date Value Ref Range Status  . Creatinine, Ur 01/24/2014 40.0  15.0 - 278.0 mg/dL Final  . Microalbum.,U,Random 01/24/2014 <3.0  0.0 - 17.0 ug/mL Final   **Verified by repeat analysis**  . MICROALB/CREAT RATIO 01/24/2014 <7.5  0.0 - 30.0 mg/g creat Final  Appointment on 01/22/2014  Component Date Value Ref Range Status  . Cholesterol, Total 01/22/2014 152  100 - 199 mg/dL Final  . Triglycerides 01/22/2014 97  0 - 149 mg/dL Final  . HDL 01/22/2014 50  >39 mg/dL Final   Comment: According to ATP-III Guidelines, HDL-C >59 mg/dL is considered a                          negative risk factor for CHD.  Marland Kitchen VLDL Cholesterol Cal 01/22/2014 19  5 - 40 mg/dL Final  . LDL Calculated 01/22/2014 83  0 - 99 mg/dL Final  . Chol/HDL Ratio 01/22/2014 3.0  0.0 - 4.4 ratio units Final   Comment:                                   T. Chol/HDL Ratio  Men  Women                                                        1/2 Avg.Risk  3.4    3.3                                                            Avg.Risk  5.0    4.4                                                         2X Avg.Risk  9.6    7.1                                                         3X Avg.Risk 23.4   11.0  . TSH 01/22/2014 2.980  0.450 - 4.500 uIU/mL Final  . Glucose 01/22/2014 101* 65 - 99 mg/dL Final  . BUN 01/22/2014 20  8 - 27 mg/dL Final  . Creatinine, Ser 01/22/2014 0.82  0.57 - 1.00 mg/dL Final  . GFR calc non Af Amer 01/22/2014 75  >59 mL/min/1.73 Final  . GFR calc Af Amer 01/22/2014 87  >59 mL/min/1.73 Final  . BUN/Creatinine Ratio 01/22/2014 24  11 - 26 Final  . Sodium 01/22/2014 142  134 - 144 mmol/L Final  . Potassium 01/22/2014 3.7  3.5 - 5.2 mmol/L Final  . Chloride 01/22/2014 101  97 - 108 mmol/L Final  . CO2 01/22/2014 25  18 - 29 mmol/L Final  . Calcium 01/22/2014 9.5  8.7 - 10.3 mg/dL Final  . Total Protein 01/22/2014 6.1  6.0 - 8.5 g/dL Final  . Albumin 01/22/2014 4.2  3.6 - 4.8 g/dL Final  . Globulin, Total 01/22/2014 1.9  1.5 - 4.5 g/dL Final  . Albumin/Globulin Ratio 01/22/2014 2.2  1.1 - 2.5 Final  . Total Bilirubin 01/22/2014 0.5  0.0 - 1.2 mg/dL Final  . Alkaline Phosphatase 01/22/2014 46  39 - 117 IU/L Final  . AST 01/22/2014 16  0 - 40 IU/L Final  . ALT 01/22/2014 16  0 - 32 IU/L Final  . Hgb A1c MFr Bld 01/22/2014 6.0* 4.8 - 5.6 % Final   Comment:          Increased risk for diabetes: 5.7 - 6.4                                   Diabetes: >6.4                                   Glycemic control for adults with diabetes: <7.0  . Est. average glucose Bld  gHb Est-m* 01/22/2014 126   Final   Mr Jeri Cos [facility] Contrast  04/11/2014     [facility] [address], [place], [place] [address] [phone]  NEUROIMAGING REPORT   STUDY DATE:04/07/2014 PATIENT NAME: Kelly Fletcher  DOB: 1951-01-06 MRN: [id number]  ORDERING CLINICIAN: Dr Dohmeier CLINICAL HISTORY:  25 year patient with facial twitchings COMPARISON FILMS: none EXAM: MRI Brain w/wo TECHNIQUE: MRI of the brain with and without contrast was obtained  utilizing 5 mm axial slices with T1, T2, T2 flair, T2 star gradient echo  and diffusion weighted views.  T1 sagittal, T2 coronal and postcontrast  views in the axial and coronal plane were obtained. CONTRAST: 15 ml iv multihance IMAGING SITE: [facility]  FINDINGS:  The brain parenchyma shows area of hyperintensity mainly on the flair  images and to a subtle extent on T2 in the left posterior frontal cortex  and subcortical region with area of surrounding vasogenic edema. There is  an enhancing 7 x 6 mm left posterior frontal subcortical enhancing lesion  with surrounding vasogenic edema as well as another tiny enhancing nodule  adjacent to it the etiology of which is indeterminate but metastasis is  possibility. Late subacute infarct is less likely. On diffusion-weighted  imaging there is subtle T2 shine through of this lesion but it is not dark  on the ADC map.no other structural lesion, tumor or infarct is noted.No  abnormal lesions are seen on diffusion-weighted views to suggest acute  ischemia. The cortical sulci, fissures and cisterns are normal in size and  appearance. Lateral, third and fourth ventricle are normal in size and  appearance. No extra-axial fluid collections are seen. No evidence of mass  effect or midline shift.   The sagittal views the posterior fossa,  pituitary gland and corpus callosum are unremarkable. No evidence of  intracranial hemorrhage on gradient-echo views. The orbits and their  contents, paranasal sinuses and calvarium are unremarkable.  Intracranial  flow voids are present.      04/11/2014    Abnormal MRI scan of the brain showing 2 nodular enhancing  left posterior frontal subcortical lesions with surrounding vasogenic  edema. Differential  diagnosis would include metastatic lesion versus  inflammatory lesion and subacute infarct would be less likely    INTERPRETING PHYSICIAN:  PRAMOD SETHI, MD Certified in  Neuroimaging by [employer] and [facility]     Assessment/Plan  1. Abnormal finding on MRI of brain - CT Chest W Contrast; Future - CT Abdomen Pelvis W Contrast; Future  2. Essential hypertension controlled  3. Seizures Facial twitch and rhythmic contractions suggest seizure, likely related to the brain lesions. Continue Tegretol.  4. Cancer of brain - CT Chest W Contrast; Future - CT Abdomen Pelvis W Contrast; Future

## 2014-04-20 ENCOUNTER — Encounter: Payer: Self-pay | Admitting: Gynecology

## 2014-04-20 ENCOUNTER — Ambulatory Visit (INDEPENDENT_AMBULATORY_CARE_PROVIDER_SITE_OTHER): Payer: Medicare Other | Admitting: Gynecology

## 2014-04-20 ENCOUNTER — Other Ambulatory Visit: Payer: Self-pay | Admitting: *Deleted

## 2014-04-20 ENCOUNTER — Ambulatory Visit (INDEPENDENT_AMBULATORY_CARE_PROVIDER_SITE_OTHER): Payer: Medicare Other

## 2014-04-20 ENCOUNTER — Other Ambulatory Visit: Payer: Self-pay | Admitting: Gynecology

## 2014-04-20 DIAGNOSIS — R930 Abnormal findings on diagnostic imaging of skull and head, not elsewhere classified: Secondary | ICD-10-CM

## 2014-04-20 DIAGNOSIS — N816 Rectocele: Secondary | ICD-10-CM

## 2014-04-20 DIAGNOSIS — Z1211 Encounter for screening for malignant neoplasm of colon: Secondary | ICD-10-CM

## 2014-04-20 DIAGNOSIS — G939 Disorder of brain, unspecified: Secondary | ICD-10-CM

## 2014-04-20 DIAGNOSIS — Z803 Family history of malignant neoplasm of breast: Secondary | ICD-10-CM

## 2014-04-20 DIAGNOSIS — D251 Intramural leiomyoma of uterus: Secondary | ICD-10-CM

## 2014-04-20 DIAGNOSIS — N8111 Cystocele, midline: Secondary | ICD-10-CM

## 2014-04-20 DIAGNOSIS — N814 Uterovaginal prolapse, unspecified: Secondary | ICD-10-CM

## 2014-04-20 DIAGNOSIS — C719 Malignant neoplasm of brain, unspecified: Secondary | ICD-10-CM

## 2014-04-20 NOTE — Patient Instructions (Signed)
Follow up with Dr. Brett Fairy as arranged.

## 2014-04-20 NOTE — Progress Notes (Signed)
Kelly Fletcher 1951/02/28 [id number]        63 y.o.  G1P1001 Presents in referral from Dr. Brett Fletcher having developed neurologic symptoms and ultimately had an MRI which shows 2 nodular enhancing left posterior frontal subcortical lesions suspicious for metastatic disease now undergoing evaluation for the primary lesion. She was referred to rule out GYN malignancy. Patient's not having any pelvic complaints. She does have a history of uterine prolapse/cystocele/rectocele which has been stable over serial exams..  Past medical history,surgical history, problem list, medications, allergies, family history and social history were all reviewed and documented in the EPIC chart.  Directed ROS with pertinent positives and negatives documented in the history of present illness/assessment and plan.  Exam: [name] assistant General appearance:  Normal Abdomen soft nontender without masses guarding rebound Pelvic external BUS vagina with atrophic changes. First to second-degree cystocele, rectocele and uterine prolapse. No evidence of vulvar or vaginal lesions.  Cervix grossly normal. Atrophic in appearance.  Uterus small normal size midline mobile nontender. Adnexa without masses or tenderness. Rectal exam is normal. Stool negative for blood.  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 Endometrial echo 4.1 mm.  Right ovary negative. Left ovary with solid hyperechoic avascular mass 9 mm. No free fluid.  Assessment/Plan:  63 y.o. 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 GYN exam is normal. Ultrasound shows 2 small myomas. Does have a small hyperechoic area left ovary avascular 9 mm which I think is physiologic. No free fluid. Unlikely any GYN malignancy would present with a brain metastases in the absence overt pelvic disease. Patient scheduled for full-body CT in attempt to find the primary.  Will continue to follow up with Dr. Brett Fletcher for ongoing care and  evaluation.     Kelly Auerbach MD, 10:04 AM 04/20/2014

## 2014-04-23 ENCOUNTER — Ambulatory Visit
Admission: RE | Admit: 2014-04-23 | Discharge: 2014-04-23 | Disposition: A | Discharge status: Home / Self Care | Payer: Medicare Other | Source: Ambulatory Visit | Attending: Internal Medicine | Admitting: Internal Medicine

## 2014-04-23 ENCOUNTER — Encounter: Payer: Self-pay | Admitting: Gynecology

## 2014-04-23 DIAGNOSIS — C719 Malignant neoplasm of brain, unspecified: Secondary | ICD-10-CM

## 2014-04-23 DIAGNOSIS — R9089 Other abnormal findings on diagnostic imaging of central nervous system: Secondary | ICD-10-CM

## 2014-04-23 MED ORDER — IOHEXOL 300 MG/ML  SOLN
100.0000 mL | Freq: Once | INTRAMUSCULAR | Status: AC | PRN
  Administered 2014-04-23: 100 mL via INTRAVENOUS

## 2014-04-23 NOTE — Procedures (Signed)
[facility]  EEG (ELECTROENCEPHALOGRAM) REPORT   STUDY DATE:    PATIENT NAME: MRN:    ORDERING CLINICIAN: Larey Seat, MD   TECHNOLOGIST:  TECHNIQUE: Electroencephalogram was recorded utilizing standard 10-20 system of lead placement and reformatted into average and bipolar montages.      RECORDING TIME:  40.1 minutes  ACTIVATION:  strobe lights and hyperventilation    CLINICAL INFORMATION:  [name] is a 63 year old Caucasian, right-handed female who complained of 3 months of facial twitching mostly affecting her right side. She has also noticed tremor and clumsiness in her right hand. An MRI showed left posterio-frontal lobe lesions with surrounding vasogenic edema , 2 nodular lesions were identified.     FINDINGS: There is excessive beta fast activity noted which correlates with the patient's benzodiazepine prescription. The patient also recently begun taking carbamazepine which has controlled and suppressed the facial twitches. EEG  Background rhythm of  9 Hz . The  EKG heart rates varied from 64-68 bpm in NSR.   Hyperventilation O amplitude buildup and here isolated epileptiform discharges were noticed arising from the T5 electrode. Photic stimulation caused further isolated left temporal and posterior frontal sharp wave discharges,  presenting as phase-reversal in the referential bipolar montages.  The highest amplitude is seen over T5 corresponding with the posterior temporal region. There were 8 of these phase reversal noted over a 30 minute recording. These did not appear in clusters and did not have a periodicity or rhythmicity to it.  These are epileptiform discharges. Patient recorded in the awake/ drowsy but not asleep state.   IMPRESSION:  EEG is abnormal, identified the seizure focus over T5.        Larey Seat , MD

## 2014-04-24 ENCOUNTER — Telehealth: Payer: Self-pay

## 2014-04-24 NOTE — Telephone Encounter (Signed)
Left message normal CT chest and abdomen, call if any questions

## 2014-05-01 ENCOUNTER — Telehealth: Payer: Self-pay | Admitting: Neurology

## 2014-05-01 DIAGNOSIS — G40109 Localization-related (focal) (partial) symptomatic epilepsy and epileptic syndromes with simple partial seizures, not intractable, without status epilepticus: Secondary | ICD-10-CM

## 2014-05-01 MED ORDER — CARBAMAZEPINE 200 MG PO TABS: 200.0000 mg | ORAL_TABLET | Freq: Three times a day (TID) | ORAL | Status: DC

## 2014-05-01 NOTE — Telephone Encounter (Signed)
I will increase the Tegretol. 200 mg in AM and 400 mg in PM.

## 2014-05-01 NOTE — Telephone Encounter (Signed)
I called and LMVM for pt that received message and will forward to Dr. Brett Fairy.   Tegretol 200mg  po bid is what she is taking now.

## 2014-05-01 NOTE — Telephone Encounter (Signed)
Patient stated Rx carbamazepine (TEGRETOL) 200 MG tablet hasn't helped much with spasms.  Still experiencing on a daily basis, spasms starting around mouth, goes up the side of nose with jerking and goes into the back.  Please call and advise.

## 2014-05-03 NOTE — Addendum Note (Signed)
Addended byOliver Hum on: 05/03/2014 08:37 AM   Modules accepted: Orders, Medications

## 2014-05-03 NOTE — Telephone Encounter (Signed)
I called pt and relayed the message below.  She had not spoken to Dr. Brett Fairy.  She will start this evening to increase the tegretol to 400mg .  Has appt for 05-04-14 at 1000.

## 2014-05-04 ENCOUNTER — Encounter: Payer: Self-pay | Admitting: Neurology

## 2014-05-04 ENCOUNTER — Ambulatory Visit (INDEPENDENT_AMBULATORY_CARE_PROVIDER_SITE_OTHER): Payer: Medicare Other | Admitting: Neurology

## 2014-05-04 VITALS — BP 171/82 | HR 67 | Ht 69.0 in | Wt 166.0 lb

## 2014-05-04 DIAGNOSIS — C801 Malignant (primary) neoplasm, unspecified: Secondary | ICD-10-CM

## 2014-05-04 DIAGNOSIS — C7949 Secondary malignant neoplasm of other parts of nervous system: Secondary | ICD-10-CM

## 2014-05-04 DIAGNOSIS — G936 Cerebral edema: Secondary | ICD-10-CM

## 2014-05-04 DIAGNOSIS — G40109 Localization-related (focal) (partial) symptomatic epilepsy and epileptic syndromes with simple partial seizures, not intractable, without status epilepticus: Secondary | ICD-10-CM

## 2014-05-04 DIAGNOSIS — C799 Secondary malignant neoplasm of unspecified site: Secondary | ICD-10-CM

## 2014-05-04 DIAGNOSIS — R930 Abnormal findings on diagnostic imaging of skull and head, not elsewhere classified: Secondary | ICD-10-CM

## 2014-05-04 DIAGNOSIS — R9089 Other abnormal findings on diagnostic imaging of central nervous system: Secondary | ICD-10-CM

## 2014-05-04 DIAGNOSIS — C7931 Secondary malignant neoplasm of brain: Secondary | ICD-10-CM

## 2014-05-04 HISTORY — DX: Localization-related (focal) (partial) symptomatic epilepsy and epileptic syndromes with simple partial seizures, not intractable, without status epilepticus: G40.109

## 2014-05-04 MED ORDER — CARBAMAZEPINE 200 MG PO TABS: 200.0000 mg | ORAL_TABLET | Freq: Three times a day (TID) | ORAL | Status: DC

## 2014-05-04 MED ORDER — DEXAMETHASONE 2 MG PO TABS
2.0000 mg | ORAL_TABLET | Freq: Three times a day (TID) | ORAL | Status: DC
Start: 2014-05-04 — End: 2014-11-13

## 2014-05-04 NOTE — Patient Instructions (Signed)
Positron Emission Tomography (PET Scan) PET stands for positron emission tomography. This is a test similar to an X-ray. Pictures can be taken of a body part after injection of a very small dose of a chemical called a radionuclide. This is combined with sugar, water, or ammonia to give off tiny particles called positrons. The positrons emitted are like small bursts of energy that can be detected by a scanner. They are processed by a computer to create images. These images can be used to study different diseases. They are often used to study cancer and cancer therapy. A scan of the entire body can be done and used to study all its parts. Because this test is tagged to a sugar used by cells, the bursts of energy show up differently in cells that use sugar faster. The computer is able to produce a color-coded picture based on this. The colors and amount of brightness on a PET image show different levels of tissue or organ function. For example, a cancer grows faster than healthy tissue and uses more sugar than normal tissue. It will absorb more of the substance injected. This causes it to appear brighter than normal tissue on the PET image. A specialist will read and explain the images. Other examinations, such as recent CT (or CAT) scans or MRI scans may help with interpretation and should be brought along. There are usually no restrictions after the test. You should drink plenty of fluids to flush the radioactive substance from your body. BEFORE THE PROCEDURE   PET is usually an outpatient procedure. Wear comfortable, loose-fitting clothes.  Do not eat for four hours before the scan. You will be encouraged to drink water.  Your caregiver will instruct you regarding the use of medications before the test.  Note: Diabetic patients should ask for any specific diet guidelines to control glucose (sugar) levels during the day of the test. There are limitations with the test if your blood sugar is not controlled  during or before the test.  Be on time because of the rapid decay of the radioactive material that must be injected. PROCEDURE  Before the procedure begins a small amount of harmless radioactive material will be injected into a vein. This means you will have a needle stick. It will take from 30 minutes to one hour for the material to travel around your body in preparation for the scan. You will lie on a cushioned table and be moved through the center of a machine that looks like a large doughnut. This is the machine that detects the positrons. It is connected to a computer that produces images that can be viewed on a monitor. This will take about 30 minutes to an hour, during which you must remain still. Let your caregiver know if this will be difficult for you. Also, let your caregiver know if you need a sedative or help dealing with claustrophobia (feeling uncomfortable in enclosed spaces). HOME CARE INSTRUCTIONS   For the protection of your privacy, test results can not be given over the phone. Make sure you receive the results of your test. Ask as to how these results are to be obtained if you have not been informed. It is your responsibility to obtain your test results.  Drink several 8-once glasses of water following the test to flush the small amount of radioactive material out of your body.  Keep your follow-up appointments. Document Released: 11/22/2002 Document Revised: 08/10/2011 Document Reviewed: 08/30/2013 [employer] Patient Information 2015 [employer]. This information   information is not intended to replace advice given to you by your health care provider. Make sure you discuss any questions you have with your health care provider.  

## 2014-05-04 NOTE — Progress Notes (Addendum)
[facility]   Provider:  Larey Seat, M D  Referring Provider: Estill Dooms, MD Primary Care Physician:  Estill Dooms, MD  Chief Complaint  Patient presents with  . RV    Rm 70, husband    HPI:  Kelly Fletcher is a 63 y.o. female, seen here as a referral from Kelly Fletcher for Facial Twitching.   Paroxysmal since mid October. She has had Vertigo and was evaluated by ENT, treated with meclizine and Valium. She had several facial twitches , most in the morning when she takes her shower. The patient's husband explained that the couple is raising their granddaughter and is stressed. in the morning and we revisited her last Labs , she was in the low normal range for  K. and Na The patient has a disturbed body image , always" feels too big" .  She has dieted for much of her life, she takes HCTZ when she feels puffy and is proud that she can shed 5 pounds on a tablet.   The patient appears pleasant and highly anxious.  No facial twitching is not evident here, but the patient became tearful.  04-13-14 Today is her grandaughter Kelly Fletcher 's  third Birthday . Abnormal MRI discussed today, 2  Possible mets most likely causing the edema of the posterior part of the left frontal brain.  Primary tumor search ; Referral to chest CT, just had mamography, had endoscopy, needs pelvic CT.  PET scan to be considered. Kelly Fletcher.    05-04-14  Kelly Fletcher has had continuous facial focal seizures involving the right angle of the mouth and the right neck. Local seizures most likely causing the focal twitching and drawing of the face. She brought a video recording.  EEG was abnormal, see documentation in procedures,   These  Seizures have initially  (after Tegretol was first  Initiated) decreased but now have resumed her previous activity and ocurr daily.   She has undergone a primary tumor search including a CT of the abdomen and pelvis with and without contrast. No abdominal or pelvic mass or  adenopathy was seen. The only calcified uterine fibroids normal for age. There was a calcified right adrenal gland. She also underwent a CT of the chest which failed to detect any tumor activity. No mass or adenopathy was seen here. She had a normal mammogram on 7-30 06-2013 no evidence of breast cancer. The patient has a family history of breast cancer and she had a brother who succumbed to a retinal melanoma. I have increased the Tegretol dose at night badly from the same dose for the morning time. I will also start her on higher dose of Decadron to reduce the swelling surrounding the brain lesions. In addition I have ordered today at total body PET scan to further search for primary. If this is negative I will refer her for a brain biopsy for lesion removal through neurosurgery. Kelly Fletcher M.D.   CC Dr. Jeanmarie Fletcher,   Will consult with Dr. London Fletcher neurosurgery, if biopsy is necessary.      Review of Systems:  Out of a complete 14 system review, the patient complains of only the following symptoms, and all other reviewed systems are negative. Vertigo, insomnia, tremor, trembling, facial twitching.   Very tearful.   History   Social History  . Marital Status: Married    Spouse Name: N/A    Number of Children: N/A  . Years of Education: N/A  Occupational History  . Not on file.   Social History Main Topics  . Smoking status: Never Smoker   . Smokeless tobacco: Never Used  . Alcohol Use: No  . Drug Use: No  . Sexual Activity: No   Other Topics Concern  . Not on file   Social History Narrative   Right handed, married 1 kids, taking care of granddaughter, caffeine 2 cups in am , soda in pm, [employer] grad.    Family History  Problem Relation Age of Onset  . Hypertension Mother   . Breast cancer Mother 31  . Heart disease Father   . Stroke Father   . Hypertension Sister   . Heart disease Sister   . Osteoporosis Sister   . Diabetes Brother   . Hypertension Brother     . Heart disease Brother   . Other Brother     BRAIN TUMOR  . Cancer Brother     Eye cancer-Melanoma  . Diabetes Brother   . Heart disease Brother   . Heart attack Sister   . Heart disease Brother   . Heart attack Brother     Past Medical History  Diagnosis Date  . Uterine prolapse   . Cystocele   . GERD (gastroesophageal reflux disease)   . Elevated cholesterol   . Rectocele   . Hypertension   . Arthritis   . Type II or unspecified type diabetes mellitus without mention of complication, uncontrolled     Type 2  . Urinary incontinence     Urgency  . Anxiety state, unspecified   . Palpitations   . Allergic rhinitis, cause unspecified   . Other and unspecified hyperlipidemia   . Unspecified hypothyroidism   . Edema   . Insomnia, unspecified   . Depressive disorder, not elsewhere classified   . Unspecified urinary incontinence   . Peptic ulcer, unspecified site, unspecified as acute or chronic, without mention of hemorrhage, perforation, or obstruction   . Disturbance of skin sensation   . Calculus of kidney   . Facial spasm 04/05/2014  . Abnormal brain MRI 04/13/2014  . 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 Tonsillectomy and adenoidectomy  1963  . Dilation and curettage of uterus      3-05  . Hysteroscopy      3-05  . Endometrial biopsy      KellyGottsegen   . Tongue biopsy      Current Outpatient Prescriptions  Medication Sig Dispense Refill  . Alum Hydroxide-Mag Carbonate (GAVISCON PO) Take by mouth daily as needed.     Marland Kitchen atorvastatin (LIPITOR) 10 MG tablet ONE DAILY TO CONTROL CHOLESTEROL 90 tablet 2  . carbamazepine (TEGRETOL) 200 MG tablet Take 1 tablet (200 mg total) by mouth 3 (three) times daily. Is taking 200mg  po am and 400mg  po pm. 90 tablet 3  . cholecalciferol (VITAMIN D) 1000 UNITS tablet Take 1,000 Units by mouth daily. Takes  2000-5000mg  daily    . co-enzyme Q-10 30 MG capsule Take 30 mg by mouth daily.     Marland Kitchen dexamethasone (DECADRON) 2 MG tablet Take 1 tablet (2 mg total) by mouth 3 (three) times daily. 90 tablet 2  . diazepam (VALIUM) 2 MG tablet Take 2 mg by mouth every 6 (six) hours as needed (For dizziness).    Marland Kitchen esomeprazole (NEXIUM) 40 MG capsule Take  1 capsule (40 mg total) by mouth daily at 12 noon. To reduce stomach acid 90 capsule 1  . fish oil-omega-3 fatty acids 1000 MG capsule Take 2 g by mouth daily.    Marland Kitchen HYDROcodone-acetaminophen (NORCO/VICODIN) 5-325 MG per tablet   0  . levothyroxine (SYNTHROID, LEVOTHROID) 125 MCG tablet Take 1 tablet (125 mcg total) by mouth daily before breakfast. 90 tablet 1  . metFORMIN (GLUCOPHAGE-XR) 500 MG 24 hr tablet Take 500 mg by mouth 2 (two) times daily. To control blood sugar    . ONE TOUCH ULTRA TEST test strip CHECK BLOOD GLUCOSE AS DIRECTED 100 each 3  . RESVERATROL PO Take by mouth. WITH D     . valACYclovir (VALTREX) 1000 MG tablet Take 1,000 mg by mouth 2 (two) times daily. To help resolve viral blisters     No current facility-administered medications for this visit.    Allergies as of 05/04/2014 - Review Complete 05/04/2014  Allergen Reaction Noted  . Ibuprofen  12/16/2013  . Latex  07/16/2011  . Naproxen  12/12/2012  . Nitrofurantoin monohyd macro  07/16/2011  . Paroxetine hcl  07/16/2011  . Prednisone  12/12/2012  . Prozac [fluoxetine hcl]  12/12/2012    Vitals: BP 171/82 mmHg  Pulse 67  Ht 5\' 9"  (1.753 m)  Wt 166 lb (75.297 kg)  BMI 24.50 kg/m2 Last Weight:  Wt Readings from Last 1 Encounters:  05/04/14 166 lb (75.297 kg)       Last Height:   Ht Readings from Last 1 Encounters:  05/04/14 5\' 9"  (1.753 m)    Physical exam:  General: The patient is awake, alert and appears not in acute distress. The patient is well groomed. Head: Normocephalic, atraumatic. Neck is supple.  Mallampati 2,  neck circumference:14 inches. Nasal airflow  unrestricted , TMJ is not evident . Retrognathia is not seen.  Cardiovascular:  Regular rate and rhythm* , without  murmurs or carotid bruit, and without distended neck veins. Respiratory: Lungs are clear to auscultation. Skin:  Without evidence of edema, or rash Trunk: normal posture. Neck stiffness, tension.   Neurologic exam : The patient is awake and alert, oriented to place and time.   Memory subjective   described as intact. There is a normal attention span & concentration ability.  Speech is fluent without dysarthria, dysphonia or aphasia. Mood and affect are appropriate.  Cranial nerves: Pupils are equal and briskly reactive to light.  Funduscopic exam without  evidence of pallor or edema. Extraocular movements  in vertical and horizontal planes intact and without nystagmus.  Visual fields by finger perimetry are intact. Hearing to finger rub intact.  Facial sensation intact to fine touch- her left face is less puffy than the right.   tongue and uvula move midline.  Motor exam: Normal tone, muscle bulk and symmetric strength in all extremities.  Sensory:  Fine touch, pinprick and vibration were tested in all extremities.  Proprioception is tested in the upper extremities only. This was normal.  Coordination: Rapid alternating movements in the fingers/hands is normal.  Finger-to-nose maneuver normal without evidence of ataxia, dysmetria .  There is a very mid low amplitude tremor in all fingers, no weakness, no rigor, no cog wheeling. No fasciculations.  She demonstrated her seizures through video capture, very impressive for non generalizing , simple focal seizure activity to right face and neck.   Gait and station: Patient walks without assistive device and is able unassisted to climb up to the exam table. Strength  within normal limits. Stance is stable and normal.  Deep tendon reflexes: in the upper and lower extremities are symmetric and intact.  Babinski maneuver response  is downgoing.   Assessment:  After physical and neurologic examination, review of laboratory studies, imaging, neurophysiology testing and pre-existing records, assessment is   Continue anti-seizure medication.  EEG indicated focal left brain hemispheric seizures.  She demonstrated her seizures through video capture, very impressive for non generalizing , simple focal seizure activity to right face and neck.   Tegretol 200 mg in AM and 2 at night ( increased form 200 mg bid po on 05-01-14 by phone ) .   Start decadron now at  3 mg bid po.  PET scan ordered.          Kelly Partridge Lainie Daubert MD  05/04/2014

## 2014-05-05 LAB — CBC WITH DIFFERENTIAL/PLATELET
BASOS: 1 %
Basophils Absolute: 0 10*3/uL (ref 0.0–0.2)
EOS ABS: 0.1 10*3/uL (ref 0.0–0.4)
Eos: 1 %
HEMATOCRIT: 42.5 % (ref 34.0–46.6)
HEMOGLOBIN: 14.3 g/dL (ref 11.1–15.9)
Immature Grans (Abs): 0.1 10*3/uL (ref 0.0–0.1)
Immature Granulocytes: 1 %
LYMPHS ABS: 1.3 10*3/uL (ref 0.7–3.1)
LYMPHS: 20 %
MCH: 31.2 pg (ref 26.6–33.0)
MCHC: 33.6 g/dL (ref 31.5–35.7)
MCV: 93 fL (ref 79–97)
Monocytes Absolute: 0.5 10*3/uL (ref 0.1–0.9)
Monocytes: 8 %
NEUTROS ABS: 4.6 10*3/uL (ref 1.4–7.0)
Neutrophils Relative %: 69 %
RBC: 4.58 x10E6/uL (ref 3.77–5.28)
RDW: 13.1 % (ref 12.3–15.4)
WBC: 6.6 10*3/uL (ref 3.4–10.8)

## 2014-05-05 LAB — COMPREHENSIVE METABOLIC PANEL
ALBUMIN: 4.3 g/dL (ref 3.6–4.8)
ALT: 34 IU/L — ABNORMAL HIGH (ref 0–32)
AST: 21 IU/L (ref 0–40)
Albumin/Globulin Ratio: 1.8 (ref 1.1–2.5)
Alkaline Phosphatase: 59 IU/L (ref 39–117)
BUN/Creatinine Ratio: 21 (ref 11–26)
BUN: 17 mg/dL (ref 8–27)
CO2: 24 mmol/L (ref 18–29)
Calcium: 9 mg/dL (ref 8.7–10.3)
Chloride: 101 mmol/L (ref 97–108)
Creatinine, Ser: 0.8 mg/dL (ref 0.57–1.00)
GFR calc Af Amer: 89 mL/min/{1.73_m2} (ref >59)
GFR, EST NON AFRICAN AMERICAN: 78 mL/min/{1.73_m2} (ref >59)
Globulin, Total: 2.4 g/dL (ref 1.5–4.5)
Glucose: 105 mg/dL — ABNORMAL HIGH (ref 65–99)
Potassium: 4.2 mmol/L (ref 3.5–5.2)
Sodium: 141 mmol/L (ref 134–144)
TOTAL PROTEIN: 6.7 g/dL (ref 6.0–8.5)
Total Bilirubin: 0.2 mg/dL (ref 0.0–1.2)

## 2014-05-07 ENCOUNTER — Telehealth: Payer: Self-pay | Admitting: Neurology

## 2014-05-10 NOTE — Progress Notes (Signed)
Quick Note:  Pt given the results of EEG by Dr. Brett Fairy at 05-04-14 appt. ______

## 2014-05-21 ENCOUNTER — Telehealth: Payer: Self-pay | Admitting: Neurology

## 2014-05-21 ENCOUNTER — Ambulatory Visit (INDEPENDENT_AMBULATORY_CARE_PROVIDER_SITE_OTHER): Payer: Medicare Other | Admitting: Neurology

## 2014-05-21 ENCOUNTER — Encounter: Payer: Self-pay | Admitting: Neurology

## 2014-05-21 VITALS — BP 183/77 | HR 63

## 2014-05-21 DIAGNOSIS — G40109 Localization-related (focal) (partial) symptomatic epilepsy and epileptic syndromes with simple partial seizures, not intractable, without status epilepticus: Secondary | ICD-10-CM

## 2014-05-21 MED ORDER — LEVETIRACETAM 750 MG PO TABS: 750.0000 mg | ORAL_TABLET | Freq: Two times a day (BID) | ORAL | Status: DC

## 2014-05-21 MED ORDER — LEVETIRACETAM 750 MG PO TABS
1500.0000 mg | ORAL_TABLET | Freq: Two times a day (BID) | ORAL | Status: DC
Start: 2014-05-21 — End: 2014-06-05

## 2014-05-21 NOTE — Telephone Encounter (Addendum)
I have talked patient, she has metastatic brain cancer, she is now on steroid, tegretol since Nov 2015, dose was increased since 05/04/2014,   Then in 05/20/2014, she woke up with rash at her hairline and under her breast, she took claritin, hydrocortisone this am, it is spreading, now on her legs, trunk, intense, itching, face started to have rash.  I will see patient at office today

## 2014-05-21 NOTE — Progress Notes (Signed)
HPI:  Kelly Fletcher is a 63 y.o. 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 Brett Fairy.   she develop partial seizure since December 16 2013, gradually getting worse, increased frequency of seizure activity   MRI of brain with without contrast in April 07 2014 showed 2 nodular enhancing left posterior frontal subcortical lesions with surrounding vasogenic edema. Differential diagnosis would include metastatic lesion versus inflammatory lesion and subacute infarct would be less likely  She started on Tegretol along with Decadron since April 13 2014, continue have seizure, with abnormal EEG, T5 sharp transient,   She was instructed to take higher dose of Tegretol 200 mg one in the morning, 2 at nighttime,  She noticed the rash starting at her hairline since May 20 2014, rapidly spreading, today, December 20 first 2015, it spreading to her trunk, arms, legs, itching, decreased erythematous rash, she came in as a urgent evaluation  PET scan is pending   She continued to have partial seizure, right facial twitching, right arm posturing, clumsiness lasting for few minutes, without loss of consciousness  Review of Systems:  Out of a complete 14 system review, the patient complains of only the following symptoms, and all other reviewed systems are negative. Vertigo, insomnia, tremor, trembling, facial twitching.   Very tearful.   History   Social History  . Marital Status: Married    Spouse Name: N/A    Number of Children: N/A  . Years of Education: N/A   Occupational History  . Not on file.   Social History Main Topics  . Smoking status: Never Smoker   . Smokeless tobacco: Never Used  . Alcohol Use: No  . Drug Use: No  . Sexual Activity: No   Other Topics Concern  . Not on file   Social History Narrative   Right handed, married 1 kids, taking care of granddaughter, caffeine 2 cups in am , soda in pm, [employer]  grad.    Family History  Problem Relation Age of Onset  . Hypertension Mother   . Breast cancer Mother 69  . Heart disease Father   . Stroke Father   . Hypertension Sister   . Heart disease Sister   . Osteoporosis Sister   . Diabetes Brother   . Hypertension Brother   . Heart disease Brother   . Other Brother     BRAIN TUMOR  . Cancer Brother     Eye cancer-Melanoma  . Diabetes Brother   . Heart disease Brother   . Heart attack Sister   . Heart disease Brother   . Heart attack Brother     Past Medical History  Diagnosis Date  . Uterine prolapse   . Cystocele   . GERD (gastroesophageal reflux disease)   . Elevated cholesterol   . Rectocele   . Hypertension   . Arthritis   . Type II or unspecified type diabetes mellitus without mention of complication, uncontrolled     Type 2  . Urinary incontinence     Urgency  . Anxiety state, unspecified   . Palpitations   . Allergic rhinitis, cause unspecified   . Other and unspecified hyperlipidemia   . Unspecified hypothyroidism   . Edema   . Insomnia, unspecified   . Depressive disorder, not elsewhere classified   . Unspecified urinary incontinence   . Peptic ulcer, unspecified site, unspecified as acute or chronic, without mention of hemorrhage, perforation,  or obstruction   . Disturbance of skin sensation   . Calculus of kidney   . Facial spasm 04/05/2014  . Abnormal brain MRI 04/13/2014  . 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 Tonsillectomy and adenoidectomy  1963  . Dilation and curettage of uterus      3-05  . Hysteroscopy      3-05  . Endometrial biopsy      Dr.Gottsegen   . Tongue biopsy      Current Outpatient Prescriptions  Medication Sig Dispense Refill  . atorvastatin (LIPITOR) 10 MG tablet ONE DAILY TO CONTROL CHOLESTEROL 90 tablet 2  . carbamazepine (TEGRETOL) 200 MG tablet  Take 1 tablet (200 mg total) by mouth 3 (three) times daily. Is taking 200mg  po am and 400mg  po pm. 90 tablet 3  . cholecalciferol (VITAMIN D) 1000 UNITS tablet Take 1,000 Units by mouth daily. Takes 2000-5000mg  daily    . co-enzyme Q-10 30 MG capsule Take 30 mg by mouth daily.     Marland Kitchen dexamethasone (DECADRON) 2 MG tablet Take 1 tablet (2 mg total) by mouth 3 (three) times daily. 90 tablet 2  . diazepam (VALIUM) 2 MG tablet Take 2 mg by mouth every 6 (six) hours as needed (For dizziness).    Marland Kitchen esomeprazole (NEXIUM) 40 MG capsule Take 1 capsule (40 mg total) by mouth daily at 12 noon. To reduce stomach acid 90 capsule 1  . fish oil-omega-3 fatty acids 1000 MG capsule Take 2 g by mouth daily.    Marland Kitchen levETIRAcetam (KEPPRA) 750 MG tablet Take 1 tablet (750 mg total) by mouth 2 (two) times daily. 60 tablet 11  . levothyroxine (SYNTHROID, LEVOTHROID) 125 MCG tablet Take 1 tablet (125 mcg total) by mouth daily before breakfast. 90 tablet 1  . metFORMIN (GLUCOPHAGE-XR) 500 MG 24 hr tablet Take 500 mg by mouth 2 (two) times daily. To control blood sugar    . ONE TOUCH ULTRA TEST test strip CHECK BLOOD GLUCOSE AS DIRECTED 100 each 3  . RESVERATROL PO Take by mouth. WITH D     . valACYclovir (VALTREX) 1000 MG tablet Take 1,000 mg by mouth 2 (two) times daily. To help resolve viral blisters     No current facility-administered medications for this visit.    Allergies as of 05/21/2014 - Review Complete 05/21/2014  Allergen Reaction Noted  . Ibuprofen  12/16/2013  . Latex  07/16/2011  . Naproxen  12/12/2012  . Nitrofurantoin monohyd macro  07/16/2011  . Paroxetine hcl  07/16/2011  . Prednisone  12/12/2012  . Prozac [fluoxetine hcl]  12/12/2012    Vitals: BP 183/77 mmHg  Pulse 63 PHYSICAL EXAMINATOINS:  Generalized: In no acute distress  Neck: Supple, no carotid bruits   Cardiac: Regular rate rhythm  Pulmonary: Clear to auscultation bilaterally  Musculoskeletal: diffuse erythematous rash  involving skull, trunk, upper, lower extremity,  Neurological examination  Mentation: Alert oriented to time, place, history taking, and causual conversation  Cranial nerve II-XII: Pupils were equal round reactive to light extraocular movements were full, visual field were full on confrontational test.  Bilateral fundi were sharp. Right lower face weakness. hearing was intact to finger rubbing bilaterally. Uvula tongue midline.  head turning and shoulder shrug and were normal and symmetric.Tongue protrusion into cheek strength was normal.  Motor: normal tone, bulk and strength.  Sensory: Intact  to fine touch, pinprick, preserved vibratory sensation, and proprioception at toes.  Coordination: Normal finger to nose, heel-to-shin bilaterally there was no truncal ataxia  Gait: Rising up from seated position without assistance, normal stance, without trunk ataxia, moderate stride, good arm swing, smooth turning, able to perform tiptoe, and heel walking without difficulty.   Romberg signs: Negative  Deep tendon reflexes: Brachioradialis 2/2, biceps 2/2, triceps 2/2, patellar 2/2, Achilles 2/2, plantar responses were flexor bilaterally.  Assessment/plan: Most consistent with drug rash, due to Tegretol,  1, continue taking Decadron 2, she continued to have partial seizure, I will start Keppra 750 mg 2 tablets twice a day 3, refer her to dermatologist urgently 4, keep follow-up appointment with Dr. Brett Fairy in June 14 2014

## 2014-05-21 NOTE — Telephone Encounter (Signed)
Pt broke out into a rash it is under her arm, under her breast and down her side and on her belly. It is itching and burning.  She thinks it may be from carbamazepine (TEGRETOL) 200 MG tablet and dexamethasone (DECADRON) 2 MG tablet. You can call her at home number until 12 after that call the cellphone [phone]. She is not sure what to do, it also in her hairline.  Please call and advise.

## 2014-05-21 NOTE — Telephone Encounter (Signed)
Patient is on her way. Spoke with patient.

## 2014-05-21 NOTE — Telephone Encounter (Addendum)
Spoke to patient and she relayed that she has broken out in a bright red rash that started at the base of her scalp an now is on her torso all the way down to her pubic area, it is also showing up on her legs.  She said it burns and is itchy.  She started on the Tegretol and Decadron 04-13-14 but just increased the dose of both on 05-04-14.  She is taking Tegretol 200mg  1 in morning and 2 at night.  The Decadron she has increased to one and one half in the morning and one and one half at night.  She would like to be seen today.  She also has a PET scan scheduled for Wednesday morning that is very important for her to keep.  Also relayed the only thing she changed was a new shampoo used on Saturday.

## 2014-05-21 NOTE — Telephone Encounter (Signed)
I have called in Ruby 750mg  bid, she should stop tegretol, worry about drug rash,   I also ask her to be at clinic ASAP.  Hinton Dyer, please put her on my schedule.

## 2014-05-22 ENCOUNTER — Telehealth: Payer: Self-pay | Admitting: Diagnostic Neuroimaging

## 2014-05-22 NOTE — Telephone Encounter (Signed)
I have talked with her dermatologist Dr. Sherley Bounds, patient indeed has drug-induced rash, she is on higher dose of dexamethasone now, hope to improve her symptoms, which usually last 7-10 days   She is taking Keppra 750 mg 2 tablets twice a day,  She has follow up appt with Dr. Brett Fairy in Jan 14th 2016

## 2014-05-22 NOTE — Telephone Encounter (Signed)
Dr. Wilhemina Bonito with [facility] is calling regarding patient. Dr. Ronnald Ramp saw the patient yesterday and patient has had a reaction to medication Tegretol. Please call. Thank you.

## 2014-05-23 ENCOUNTER — Ambulatory Visit (HOSPITAL_COMMUNITY)
Admission: RE | Admit: 2014-05-23 | Discharge: 2014-05-23 | Disposition: A | Discharge status: Home / Self Care | Payer: Medicare Other | Source: Ambulatory Visit | Attending: Neurology | Admitting: Neurology

## 2014-05-23 DIAGNOSIS — C7949 Secondary malignant neoplasm of other parts of nervous system: Secondary | ICD-10-CM

## 2014-05-23 DIAGNOSIS — C801 Malignant (primary) neoplasm, unspecified: Secondary | ICD-10-CM | POA: Diagnosis present

## 2014-05-23 DIAGNOSIS — C7931 Secondary malignant neoplasm of brain: Secondary | ICD-10-CM | POA: Diagnosis not present

## 2014-05-23 DIAGNOSIS — E119 Type 2 diabetes mellitus without complications: Secondary | ICD-10-CM | POA: Diagnosis not present

## 2014-05-23 DIAGNOSIS — C799 Secondary malignant neoplasm of unspecified site: Secondary | ICD-10-CM

## 2014-05-23 LAB — GLUCOSE, CAPILLARY: GLUCOSE-CAPILLARY: 103 mg/dL — AB (ref 70–99)

## 2014-05-23 MED ORDER — FLUDEOXYGLUCOSE F - 18 (FDG) INJECTION
10.1000 | Freq: Once | INTRAVENOUS | Status: AC | PRN
Start: 2014-05-23 — End: 2014-05-23
  Administered 2014-05-23: 10.1 via INTRAVENOUS

## 2014-05-27 ENCOUNTER — Other Ambulatory Visit: Payer: Self-pay | Admitting: Internal Medicine

## 2014-05-29 ENCOUNTER — Telehealth: Payer: Self-pay | Admitting: Neurology

## 2014-05-29 DIAGNOSIS — R9089 Other abnormal findings on diagnostic imaging of central nervous system: Secondary | ICD-10-CM

## 2014-05-29 NOTE — Telephone Encounter (Signed)
PET did not indicate a primary tumour!  The brain lesion lights up, but no where else.   The patient was changed from decadron 4  Day and d/c Tegretal after a reaction, benadryl po, now on Keppra.  I called patient.  She is doing better. She was happy to ear the results.

## 2014-05-29 NOTE — Telephone Encounter (Signed)
Pt and husband is calling wanting the results of the PET scan they had at [facility].  Please call and advise.

## 2014-05-29 NOTE — Addendum Note (Signed)
Addended by: Larey Seat on: 05/29/2014 05:02 PM   Modules accepted: Orders

## 2014-05-29 NOTE — Telephone Encounter (Signed)
Printed out report and placed in Dr. Allie Bossier in box.

## 2014-05-31 ENCOUNTER — Telehealth: Payer: Self-pay

## 2014-05-31 NOTE — Telephone Encounter (Signed)
Patient called and requested message to be sent to Dr.Green (patient informed Dr.Green is out of office until 1.26.16 ). Patient without primary tumor. Patient will be referred by neurologist to a specialist @ [place] or in [place].   Patient with pending appointment on 1.26.16 with Dr.Green

## 2014-06-04 ENCOUNTER — Telehealth: Payer: Self-pay | Admitting: Neurology

## 2014-06-04 NOTE — Telephone Encounter (Signed)
Pt is calling stating the levETIRAcetam (KEPPRA) 750 MG tablet is not working.  She had about 7 seizures today. She would like for Dr. Brett Fairy to give her a call back today if possible.  Please call and advise.  She has a lot of shaking in her right hand and she is numb around her mouth.  She is not sure if this is coming from the levETIRAcetam (KEPPRA) 750 MG tablet or not, it does make her hyper.

## 2014-06-05 MED ORDER — LEVETIRACETAM 750 MG PO TABS: ORAL_TABLET | ORAL | Status: DC

## 2014-06-05 NOTE — Telephone Encounter (Signed)
Several seizures waking her form sleep and during normal activities, since the new year increasing frequency. ,  Lip quivers , she is hoarse and sees a trigger in stress. She increased her valium and took keppra.  Reduced keppra 750 mg bid.   I will refer to Yetta Glassman neurosurgery  at [place].

## 2014-06-05 NOTE — Telephone Encounter (Signed)
I spoke to pt and she is taking keppra 1500 mg po bid.  Has had seizures (mild and some more intense).  On 1/1-16 had one that went from neck to vocal (voice trembling).   States that SE of keppra causing irritability, agitated, anxious, hyper).  Taking prednisone as well.

## 2014-06-06 NOTE — Telephone Encounter (Signed)
Forwarded message to [facility] referrals.  She stated that she did call them, relayed that she faxed to them 2 days ago.

## 2014-06-11 ENCOUNTER — Telehealth: Payer: Self-pay | Admitting: Neurology

## 2014-06-11 DIAGNOSIS — G40109 Localization-related (focal) (partial) symptomatic epilepsy and epileptic syndromes with simple partial seizures, not intractable, without status epilepticus: Secondary | ICD-10-CM

## 2014-06-11 NOTE — Telephone Encounter (Signed)
Pt woke up at 5:30am and has had a lot of vertigo, she states she lost a lot of fluid last night.  She has not had this since October, she is having pressure in the back and the top of her head.  She does have an appt with the cancer center tomorrow.  She did not know if there is something else she needs to be doing.  Please call and advise.

## 2014-06-11 NOTE — Telephone Encounter (Signed)
I called the patient. The patient has developed some dizziness, vertigo without nausea. The patient has had similar problems in October, and she has had an MRI study in November showing frontal lobe lesions likely metastatic cancer. The patient will be seeing Dr. Domingo Cocking at [facility]. The patient is on diazepam which has helped, taking only 4 mg total daily. The patient will go on diazepam 2 mg 3 times daily. She is not tolerating the Keppra well, this makes her too hyper, she cannot rest on the medication. We may need to consider switching to another medication such as Depakote. The patient is not having any other symptoms of numbness, weakness, balance changes, she has had slurred speech since October 2015.

## 2014-06-12 ENCOUNTER — Other Ambulatory Visit: Payer: Self-pay | Admitting: Internal Medicine

## 2014-06-12 MED ORDER — LAMOTRIGINE ER 25 & 50 & 100 MG PO KIT: 25.0000 mg | PACK | ORAL | Status: DC

## 2014-06-12 NOTE — Telephone Encounter (Signed)
There have been two medications not well tolerated , Carbamazepine and keppra. Will change to lamictal slowly- beginning with 25 mg daily.

## 2014-06-12 NOTE — Addendum Note (Signed)
Addended by: Larey Seat on: 06/12/2014 09:45 AM   Modules accepted: Orders

## 2014-06-12 NOTE — Telephone Encounter (Addendum)
That's Lamictal extended release once daily. Kelly Fletcher was on the way to see Dr. Tommi Rumps in [place] at [place].

## 2014-06-14 ENCOUNTER — Ambulatory Visit (INDEPENDENT_AMBULATORY_CARE_PROVIDER_SITE_OTHER): Payer: Medicare Other | Admitting: Neurology

## 2014-06-14 ENCOUNTER — Encounter: Payer: Self-pay | Admitting: Neurology

## 2014-06-14 VITALS — BP 162/85 | HR 64 | Resp 15 | Wt 167.5 lb

## 2014-06-14 DIAGNOSIS — D496 Neoplasm of unspecified behavior of brain: Secondary | ICD-10-CM

## 2014-06-14 DIAGNOSIS — F411 Generalized anxiety disorder: Secondary | ICD-10-CM

## 2014-06-14 DIAGNOSIS — G40109 Localization-related (focal) (partial) symptomatic epilepsy and epileptic syndromes with simple partial seizures, not intractable, without status epilepticus: Secondary | ICD-10-CM

## 2014-06-14 MED ORDER — DIAZEPAM 2 MG PO TABS: 2.0000 mg | ORAL_TABLET | Freq: Four times a day (QID) | ORAL | Status: DC | PRN

## 2014-06-14 NOTE — Progress Notes (Addendum)
[facility]   Provider:  Larey Seat, M D  Referring Provider: Estill Dooms, MD Primary Care Physician:  Estill Dooms, MD  Chief Complaint  Patient presents with  . RV seizures    Rm 46, husband, niece    HPI:  Kelly Fletcher is a 64 y.o. female, seen here as a referral from Dr. Nyoka Cowden for Facial Twitching. Paroxysmal since mid October. She has had Vertigo and was evaluated by ENT, treated with meclizine and Valium.  She had several facial twitches , most in the morning when she takes her shower. The patient's husband explained that the couple is raising their granddaughter and is stressed. in the morning and we revisited her last Labs , she was in the low normal range for  K. and Na The patient has a disturbed body image , always" feels too big" .  She has dieted for much of her life, she takes HCTZ when she feels puffy and is proud that she can shed 5 pounds on a tablet.  The patient appears pleasant and highly anxious.  No facial twitching is not evident here, but the patient became tearful.  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  Primary tumor search ; Referral to chest CT, just had mamography, had endoscopy, needs pelvic CT.  PET scan to be considered. Kelly Fletcher.   05-04-14  Kelly Fletcher has had continuous facial focal seizures involving the right angle of the mouth and the right neck. Local seizures most likely causing the focal twitching and drawing of the face. She brought a video recording.  EEG was abnormal, see documentation in procedures, These  Seizures have initially  (after Tegretol was first  Initiated) decreased but now have resumed her previous activity and ocurr daily.  She has undergone a primary tumor search including a CT of the abdomen and pelvis with and without contrast. No abdominal or pelvic mass or adenopathy was seen. The only calcified uterine  fibroids normal for age. There was a calcified right adrenal gland. She also underwent a CT of the chest which failed to detect any tumor activity. No mass or adenopathy was seen here. She had a normal mammogram on 7-30 06-2013 no evidence of breast cancer. The patient has a family history of breast cancer and she had a brother who succumbed to a retinal melanoma. I have increased the Tegretol dose at night badly from the same dose for the morning time. I will also start her on higher dose of Decadron to reduce the swelling surrounding the brain lesions. In addition I have ordered today at total body PET scan to further search for primary. If this is negative I will refer her for a brain biopsy for lesion removal through neurosurgery. Kelly Fletcher M.D.   CC Dr. Jeanmarie Hubert,   06-14-14 We discussed the visit with dr Tommi Fletcher at [facility] 06-12-14.  The patient the patient has seen Kelly Fletcher who has made arrangements for a surgery on 1-2 8-16. Prior to the surgery,  a lumbar puncture will be obtained to look for cells in the spinal fluid, her primary care physician Dr. Nyoka Cowden also had wanted to do some additional blood work on the 20th of  January and  To meet with the patient on the 26th  to discuss the blood work.  I would kindly suggest that she has her blood work drawn with  Kelly Fletcher in her preop preparation and this way we can keep hopefully all-in-one hand.  I have clarified with her that she has received her Lamictal dose pack,  she will start with 25 mg of Lamictal  ER 24 hours daily fand then slowly increase over several weeks to 62m  and finally 100 mg of Lamictal.  Once she reaches for 50 mg dosing she should be able to discontinue the Keppra.  Lamictal is not available as an intravenous drug,  but Keppra is-  so it is possible that during her hospital stay she may have a couple of days on intravenous Keppra before she can take medication by mouth again.  I would like to follow-up with the  patient in February we will make a phone contact and see when she will be discharged.  I have attempted to answer all her questions today as well as the usual workup sequence of biopsy versus radiation versus chemotherapy.  and to take it is a very good sign that Dr. FTommi Rumpsalso does not suspect a glioblastoma multiforme  be present.  We have no evidence that there is any other primary tumor based on PET scan multiple CT scans and  Eye, dental and ultrasound exams.  With a  primary CNS tumor present, I will wait for Dr. FShanda Bumpsassessment as to the type of tumor.       Review of Systems:  Out of a complete 14 system review, the patient complains of only the following symptoms, and all other reviewed systems are negative. Vertigo, insomnia, tremor, trembling, facial twitching.    giddy.   History   Social History  . Marital Status: Married    Spouse Name: N/A    Number of Children: N/A  . Years of Education: N/A   Occupational History  . Not on file.   Social History Main Topics  . Smoking status: Never Smoker   . Smokeless tobacco: Never Used  . Alcohol Use: No  . Drug Use: No  . Sexual Activity: No   Other Topics Concern  . Not on file   Social History Narrative   Right handed, married 1 kids, taking care of granddaughter, caffeine 2 cups in am , soda in pm, [employer].    Family History  Problem Relation Age of Onset  . Hypertension Mother   . Breast cancer Mother 757 . Heart disease Father   . Stroke Father   . Hypertension Sister   . Heart disease Sister   . Osteoporosis Sister   . Diabetes Brother   . Hypertension Brother   . Heart disease Brother   . Other Brother     BRAIN TUMOR  . Cancer Brother     Eye cancer-Melanoma  . Diabetes Brother   . Heart disease Brother   . Heart attack Sister   . Heart disease Brother   . Heart attack Brother     Past Medical History  Diagnosis Date  . Uterine prolapse   . Cystocele   . GERD (gastroesophageal  reflux disease)   . Elevated cholesterol   . Rectocele   . Hypertension   . Arthritis   . Type II or unspecified type diabetes mellitus without mention of complication, uncontrolled     Type 2  . Urinary incontinence     Urgency  . Anxiety state, unspecified   . Palpitations   . Allergic rhinitis, cause unspecified   . Other and unspecified hyperlipidemia   . Unspecified  hypothyroidism   . Edema   . Insomnia, unspecified   . Depressive disorder, not elsewhere classified   . Unspecified urinary incontinence   . Peptic ulcer, unspecified site, unspecified as acute or chronic, without mention of hemorrhage, perforation, or obstruction   . Disturbance of skin sensation   . Calculus of kidney   . Facial spasm 04/05/2014  . Abnormal brain MRI 04/13/2014  . 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 Tonsillectomy and adenoidectomy  1963  . Dilation and curettage of uterus      3-05  . Hysteroscopy      3-05  . Endometrial biopsy      [facility]   . Tongue biopsy      Current Outpatient Prescriptions  Medication Sig Dispense Refill  . atorvastatin (LIPITOR) 10 MG tablet ONE DAILY TO CONTROL CHOLESTEROL 90 tablet 2  . cholecalciferol (VITAMIN D) 1000 UNITS tablet Take 1,000 Units by mouth daily. Takes 2000-5025m daily    . co-enzyme Q-10 30 MG capsule Take 30 mg by mouth daily.     .Marland Kitchendexamethasone (DECADRON) 2 MG tablet Take 1 tablet (2 mg total) by mouth 3 (three) times daily. (Patient taking differently: Take 2 mg by mouth 3 (three) times daily. 1.5 tabs po bid (total 6 mg daily).) 90 tablet 2  . diazepam (VALIUM) 2 MG tablet TAKE 1 TABLET BY MOUTH EVERY 6 HOURS AS NEEDED FOR DIZZINESS 30 tablet 1  . fish oil-omega-3 fatty acids 1000 MG capsule Take 2 g by mouth daily.    .Marland KitchenlevETIRAcetam (KEPPRA) 750 MG tablet 750 mg bid po. 120 tablet 11  . levothyroxine  (SYNTHROID, LEVOTHROID) 125 MCG tablet Take 1 tablet (125 mcg total) by mouth daily before breakfast. 90 tablet 1  . metFORMIN (GLUCOPHAGE-XR) 500 MG 24 hr tablet Take 500 mg by mouth 2 (two) times daily. To control blood sugar    . ONE TOUCH ULTRA TEST test strip CHECK BLOOD GLUCOSE AS DIRECTED 100 each 3  . RESVERATROL PO Take by mouth. WITH D     . esomeprazole (NEXIUM) 40 MG capsule Take 1 capsule (40 mg total) by mouth daily at 12 noon. To reduce stomach acid (Patient taking differently: Take by mouth daily at 12 noon. To reduce stomach acid.   Taking OTC brand.) 90 capsule 1  . LamoTRIgine 25 & 50 & 100 MG KIT Take 25 mg by mouth 1 day or 1 dose. (Patient not taking: Reported on 06/14/2014) 1 kit 0  . valACYclovir (VALTREX) 1000 MG tablet Take 1,000 mg by mouth 2 (two) times daily as needed. To help resolve viral blisters     No current facility-administered medications for this visit.    Allergies as of 06/14/2014 - Review Complete 06/14/2014  Allergen Reaction Noted  . Ibuprofen  12/16/2013  . Latex  07/16/2011  . Naproxen  12/12/2012  . Nitrofurantoin monohyd macro  07/16/2011  . Paroxetine hcl  07/16/2011  . Prednisone  12/12/2012  . Prozac [fluoxetine hcl]  12/12/2012  . Tegretol [carbamazepine] Hives 06/05/2014    Vitals: BP 162/85 mmHg  Pulse 64  Resp 15  Wt 167 lb 8 oz (75.978 kg) Last Weight:  Wt Readings from Last 1 Encounters:  06/14/14 167 lb 8 oz (75.978 kg)       Last Height:   Ht Readings from Last 1  Encounters:  05/04/14 5' 9"  (1.753 m)    Physical exam:  General: The patient is awake, alert and appears not in acute distress. The patient is well groomed. Head: Normocephalic, atraumatic. Neck is supple.  Mallampati 2,  neck circumference:14 inches. Nasal airflow unrestricted , TMJ is not evident . Retrognathia is not seen.  Cardiovascular:  Regular rate and rhythm* , without  murmurs or carotid bruit, and without distended neck veins. Respiratory: Lungs  are clear to auscultation. Skin:  Without evidence of edema, or rash Trunk:Neck stiffness , right shoulder and arm "feel tense"    Neurologic exam : The patient is awake and alert, oriented to place and time.   Memory subjective   described as intact. There is an anxiety related limitation to attention span & concentration ability. The patient has handwriting difficulties, no word finding troubles, but is slower in a calculation and word recall. Speech is fluent without dysarthria, dysphonia or aphasia.   Mood and affect : anxious  Cranial nerves: Pupils are equal and briskly reactive to light.  Funduscopic exam without  evidence of pallor or edema. Extraocular movements  in vertical and horizontal planes intact and without nystagmus.  Visual fields by finger perimetry are intact. Hearing to finger rub intact.  Facial sensation intact to fine touch- her left face is less puffy than the right.  She has now a facial droop.   tongue and uvula move midline.  Motor exam: right hand feels clumsy and heavy - this is the dominant hand. The sensation improved under steroid po use.   Sensory:  Fine touch, pinprick and vibration were tested in all extremities.  Proprioception is tested in the upper extremities only. This was normal.  Coordination: Rapid alternating movements in the fingers/hands is normal.  Finger-to-nose maneuver normal without evidence of ataxia, dysmetria .  There is a very mid low amplitude tremor in all fingers, no weakness, no rigor, no cog wheeling. No fasciculations.  She demonstrated her seizures through video capture, very impressive for non generalizing , simple focal seizure activity to right face and neck.   Gait and station: Patient walks without assistive device and is able unassisted to climb up to the exam table. Strength within normal limits. Stance is stable and normal.  Deep tendon reflexes: in the upper and lower extremities are symmetric and intact.  Babinski  maneuver response is downgoing.   Assessment:  After physical and neurologic examination, review of laboratory studies, imaging, neurophysiology testing and pre-existing records, assessment is   Continue anti-seizure medication.  EEG indicated focal onset  left brain hemispheric seizures.  She demonstrated her seizures through video capture, very impressive for non generalizing , simple focal seizure activity to right face and neck.   Tegretol d/c due to skin rash. Improved immediately after d/c / .  Started on Keppra 750 mg bid and became highly anxious and emotionally labile,  now reduced to 750 mg Keppra for 14 days and started on tid diazepam and on Lamictal XR 24  In AM only- this will be titrated.   Start decadron now at  3 mg bid po.   I appreciate Dr. Shanda Bumps care of this patient and await his notes from Tuesday.       PS : The patient is not driving    Larey Seat MD  06/14/2014

## 2014-06-15 ENCOUNTER — Telehealth: Payer: Self-pay | Admitting: Neurology

## 2014-06-15 MED ORDER — LAMOTRIGINE ER 25 MG PO TB24: ORAL_TABLET | ORAL | Status: DC

## 2014-06-15 NOTE — Telephone Encounter (Signed)
Patient calling back and stated she's at pharmacy waiting for Rx to be forwarded.  Please call [phone] and advise.

## 2014-06-15 NOTE — Telephone Encounter (Signed)
I called the pharmacy.  They are able to dispense the tablets (same dose as kit, just not packaged as the kit) and the co-pay is $5.  They state the patient is at the pharmacy and is aware.  They will call us back if anything further is needed.   

## 2014-06-15 NOTE — Telephone Encounter (Signed)
I called the pharmacy.  They said the patient already picked up the Rx for Lamictal and paid $344.  I called the patient back.  She said she spoke with Ronalee Belts and he told her she could return the Rx since she had not opened it.  I called the pharmacy back.  Spoke with Arbie Cookey who said Ronalee Belts works the night shift and was not working at this time.  She had me speak with Lattie Haw, the pharmacist on duty.  She said Ronalee Belts did not tell her anything about this patient, but they would make an exception and accept the med return.  She recommended we send a new Rx for 25mg  tabs only with tapering instructions for one month, then transition her to new Rx next month, rather than her paying multiple co-pays for the first month.

## 2014-06-15 NOTE — Telephone Encounter (Signed)
Do not open the pack and call Kelly Fletcher - I think there is a coupon online availble. She will help you get covered. CD

## 2014-06-15 NOTE — Telephone Encounter (Signed)
Patient stated Rx for LamoTRIgine 25 & 50 & 100 MG KIT cost 344.00 out of pocket, due to being brand name.  Pharmacy instructed her to have Rx written for 14, 25 , 1 bottle, 2nd bottle for 14, 50, and 3rd bottle for 7,100.  Each Rx would cost her 5.00 each if written for 3 different Rx's.  Patient suppose to start medication this am.  Please call and advise asap.

## 2014-06-18 ENCOUNTER — Telehealth: Payer: Self-pay | Admitting: *Deleted

## 2014-06-18 NOTE — Telephone Encounter (Signed)
Patient called and wanted you to know Kelly Fletcher) She canceled her appointment with you due to having appointments with [facility]. She is going to have a Lumbar Puncture on 1/26 and Surgery on 1/28. She wanted you to know.

## 2014-06-20 ENCOUNTER — Other Ambulatory Visit: Payer: Medicare Other

## 2014-06-25 ENCOUNTER — Telehealth: Payer: Self-pay | Admitting: *Deleted

## 2014-06-25 NOTE — Telephone Encounter (Signed)
Form,Jury Summons and need a letter also to Rose and Dr Dohmeier to be completed 06-25-14.

## 2014-06-26 ENCOUNTER — Ambulatory Visit: Payer: Medicare Other | Admitting: Internal Medicine

## 2014-06-27 DIAGNOSIS — E119 Type 2 diabetes mellitus without complications: Secondary | ICD-10-CM | POA: Insufficient documentation

## 2014-06-27 NOTE — Telephone Encounter (Signed)
Dr. Nyoka Cowden Aware.

## 2014-06-28 ENCOUNTER — Other Ambulatory Visit: Payer: Self-pay | Admitting: Internal Medicine

## 2014-06-28 DIAGNOSIS — C719 Malignant neoplasm of brain, unspecified: Secondary | ICD-10-CM | POA: Insufficient documentation

## 2014-07-02 ENCOUNTER — Encounter: Payer: Self-pay | Admitting: *Deleted

## 2014-07-02 NOTE — Telephone Encounter (Signed)
Started letter for Dr. Brett Fairy.  Routed to her to finish and then print and sign.

## 2014-07-03 ENCOUNTER — Telehealth: Payer: Self-pay | Admitting: *Deleted

## 2014-07-03 NOTE — Telephone Encounter (Signed)
Letter at front desk, Madaline Savage Summons for patient 07-03-14.

## 2014-07-03 NOTE — Telephone Encounter (Signed)
Signed and taken to MR.

## 2014-07-14 DIAGNOSIS — Z95828 Presence of other vascular implants and grafts: Secondary | ICD-10-CM | POA: Insufficient documentation

## 2014-07-19 ENCOUNTER — Telehealth: Payer: Self-pay | Admitting: Neurology

## 2014-07-19 NOTE — Telephone Encounter (Signed)
I left a MY chart note for Kelly Fletcher .

## 2014-07-26 ENCOUNTER — Ambulatory Visit: Payer: Medicare Other | Admitting: Neurology

## 2014-08-06 ENCOUNTER — Telehealth: Payer: Self-pay | Admitting: Neurology

## 2014-08-06 NOTE — Telephone Encounter (Signed)
[name], Please contact Kelly Fletcher and see how she is doing, I am worried, she has not picked up her e mails. CD  ,

## 2014-08-07 NOTE — Telephone Encounter (Signed)
LMVM for pt that was calling to check in with her.  Asked her to call us back.

## 2014-08-08 NOTE — Telephone Encounter (Signed)
I called and spoke to pt.   She had brain surgery at [facility] in [place], 06-28-14.  Was in the hospital for 10 days, then rehab for 21 days.  She is not started this last Monday Chemo and Radiation (for 6 wks).  She is getting ST/PT.  Dr. Leonel Ramsay is her onc/rad.

## 2014-08-09 NOTE — Telephone Encounter (Signed)
Patient able to speak and converse on the phone.

## 2014-08-12 DIAGNOSIS — I82409 Acute embolism and thrombosis of unspecified deep veins of unspecified lower extremity: Secondary | ICD-10-CM | POA: Insufficient documentation

## 2014-08-25 ENCOUNTER — Other Ambulatory Visit: Payer: Self-pay | Admitting: Internal Medicine

## 2014-09-11 DIAGNOSIS — G40109 Localization-related (focal) (partial) symptomatic epilepsy and epileptic syndromes with simple partial seizures, not intractable, without status epilepticus: Secondary | ICD-10-CM | POA: Insufficient documentation

## 2014-09-21 ENCOUNTER — Other Ambulatory Visit: Payer: Self-pay | Admitting: Internal Medicine

## 2014-10-09 ENCOUNTER — Ambulatory Visit: Payer: Medicare Other | Attending: Radiology | Admitting: Occupational Therapy

## 2014-10-09 ENCOUNTER — Ambulatory Visit: Payer: Medicare Other

## 2014-10-09 DIAGNOSIS — R29818 Other symptoms and signs involving the nervous system: Secondary | ICD-10-CM | POA: Diagnosis not present

## 2014-10-09 DIAGNOSIS — R279 Unspecified lack of coordination: Secondary | ICD-10-CM | POA: Insufficient documentation

## 2014-10-09 DIAGNOSIS — R4189 Other symptoms and signs involving cognitive functions and awareness: Secondary | ICD-10-CM | POA: Diagnosis not present

## 2014-10-09 DIAGNOSIS — R278 Other lack of coordination: Secondary | ICD-10-CM | POA: Diagnosis not present

## 2014-10-09 DIAGNOSIS — R29898 Other symptoms and signs involving the musculoskeletal system: Secondary | ICD-10-CM | POA: Diagnosis not present

## 2014-10-09 DIAGNOSIS — R6889 Other general symptoms and signs: Secondary | ICD-10-CM | POA: Diagnosis not present

## 2014-10-09 DIAGNOSIS — M6289 Other specified disorders of muscle: Secondary | ICD-10-CM | POA: Insufficient documentation

## 2014-10-09 DIAGNOSIS — R4701 Aphasia: Secondary | ICD-10-CM

## 2014-10-09 DIAGNOSIS — R269 Unspecified abnormalities of gait and mobility: Secondary | ICD-10-CM | POA: Insufficient documentation

## 2014-10-09 NOTE — Patient Instructions (Addendum)
  Reminders for talking: *SLOW DOWN your talking - open your mouth a LITTLE more than you would normally, when you talk - put a small pause between your words as you talk - elongate the vowel sounds in the words as you talk  *Think about simplifying your message for the time being    Speech Exercises  Repeat 2 times, 2-3 times a day  Call the cat "Buttercup" A calendar of [place], [place] Four floors to cover Yellow oil ointment Fellow lovers of felines Catastrophe in Max' plums The church's chimes chimed Telling time 'til eleven Five valve levers Keep the gate closed Go see that guy Fat cows give milk [employer] Gophers Fat frogs flip freely Kohl's into bed Get that game to [name] Thick thistles stick together Cinnamon aluminum linoleum Black bugs blood Lovely lemon linament Red leather, yellow leather  Big grocery buggy    Purple baby carriage [facility] Proper copper coffee pot Ripe purple cabbage Three free throws [employer] tackled  [employer] dipped the dessert  [facility] that [employer] of [place] Shirts shrink, shells shouldn't [place] [employer]ers Take the tackle box File the flash message Give me five flapjacks Fundamental relatives Dye the pets purple Talking [place] time after time Dark chocolate chunks Political landscape of the kingdom Estate manager/land agent genius We played yo-yos yesterday  =====================================================

## 2014-10-09 NOTE — Therapy (Signed)
[facility] [address] [place], [place], [address] Phone: [phone]   Fax:  [phone]  Speech Language Pathology Evaluation  Patient Details  Name: Kelly Fletcher MRN: [id number] Date of Birth: [birth year] Referring Provider:  Lynden Ang, MD  Encounter Date: 10/09/2014      End of Session - 10/09/14 1210    Visit Number 1   Number of Visits 16   Date for SLP Re-Evaluation 12/08/14   SLP Start Time 0935   SLP Stop Time  1017   SLP Time Calculation (min) 42 min   Activity Tolerance Patient tolerated treatment well      Past Medical History  Diagnosis Date  . Uterine prolapse   . Cystocele   . GERD (gastroesophageal reflux disease)   . Elevated cholesterol   . Rectocele   . Hypertension   . Arthritis   . Type II or unspecified type diabetes mellitus without mention of complication, uncontrolled     Type 2  . Urinary incontinence     Urgency  . Anxiety state, unspecified   . Palpitations   . Allergic rhinitis, cause unspecified   . Other and unspecified hyperlipidemia   . Unspecified hypothyroidism   . Edema   . Insomnia, unspecified   . Depressive disorder, not elsewhere classified   . Unspecified urinary incontinence   . Peptic ulcer, unspecified site, unspecified as acute or chronic, without mention of hemorrhage, perforation, or obstruction   . Disturbance of skin sensation   . Calculus of kidney   . Facial spasm 04/05/2014  . Abnormal brain MRI 04/13/2014  . 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 Tonsillectomy and adenoidectomy  1963  . Dilation and curettage of uterus      3-05  . Hysteroscopy      3-05  . Endometrial biopsy      Dr.Gottsegen   . Tongue biopsy      There were no vitals filed for this visit.  Visit Diagnosis: Aphasia      Subjective  Assessment - 10/09/14 1042    Subjective Pt tearful in describing her medical history and condition x3 today.   Patient is accompained by: Family member  husband Saralyn Pilar            SLP Evaluation [facility] - 10/09/14 1043    SLP Visit Information   SLP Received On 10/09/14   Onset Date July 2015   Medical Diagnosis s/p glioblastoma removal, seizure   Pain Assessment   Currently in Pain? Yes   Pain Score 8    Pain Location Arm   Pain Orientation Right   Pain Onset More than a month ago   Pain Relieving Factors movement   General Information   Other Pertinent Information Pt had approx three weeks inpatient rehab at [facility], then outpatient therapies during radiation treatments at [facility], alert   Mobility Status ambulating with cane   Prior Functional Status   Cognitive/Linguistic Baseline Within functional limits   Cognition   Overall Cognitive Status Difficult to assess   Difficult to assess due to --  time constraints   Auditory Comprehension   Overall Auditory Comprehension Appears within functional limits for tasks assessed   Expression   Primary Mode of Expression Verbal   Verbal Expression   Overall Verbal Expression Appears  within functional limits for tasks assessed   Oral Motor/Sensory Function   Overall Oral Motor/Sensory Function Impaired   Motor Speech   Overall Motor Speech Appears within functional limits for tasks assessed   Respiration Within functional limits   Phonation Normal   Articulation Impaired   Level of Impairment Word   Intelligibility Intelligible   Motor Planning Impaired   Level of Impairment Word   Motor Speech Errors Aware;Groping for words;Consistent   Effective Techniques Slow rate              SLP Education - 10-25-2014 1016    Education provided Yes   Education Details HEP, compensations for speech intelligibility   Person(s) Educated Patient;Spouse   Methods Explanation;Demonstration;Verbal  cues;Handout   Comprehension Verbalized understanding;Need further instruction          SLP Short Term Goals - 10/25/2014 1215    SLP SHORT TERM GOAL #1   Title pt will complete HEP for motor speech planning with slow rate 95% success with rare verbal cues   Time 4   Period Weeks   Status New   SLP SHORT TERM GOAL #2   Title pt will produce sentences using speech compensations with 85% fluency   Time 4   Period Weeks   Status New   SLP SHORT TERM GOAL #3   Title pt will tell SLP three compensations for improving motor planning for speech   Time 4   Period Weeks   Status New   SLP SHORT TERM GOAL #4   Title pt to participate in 5 minutes simple conversation with 85% fluency with modified independence   Time 4   Period Weeks   Status New          SLP Long Term Goals - 10-25-14 1217    SLP LONG TERM GOAL #1   Title pt will participate in 10 mintues mod complex conversation 90% fluency, with modified independence (compensations)   Time 8   Period Weeks   Status New   SLP LONG TERM GOAL #2   Title pt will complete HEP with 90% fluency and modifed independence (compensations)   Time 8   Period Weeks   Status New   SLP LONG TERM GOAL #3   Title pt will participate in 7 minutes complex conversation with 85% fluency with modified independence   Time 8   Period Weeks   Status New          Plan - 10-25-14 1211    Clinical Impression Statement Pt presents with aphasia most like Broca's type, with consistent apraxic-like behaviors during spontaneous speech. Pt tearful x3 today when describing symptoms and previous ST. She has had extensive ST in both inpatient rehab (three weeks) and in outpatient settings (x1/week during radiation tx) Skilled ST necessary to maximize pt's verbal expression skills to improve pt's QOL. Marland Kitchen   Speech Therapy Frequency 2x / week   Duration --  8 weeks   Treatment/Interventions Cueing hierarchy;Functional tasks;Patient/family  education;Compensatory strategies;SLP instruction and feedback  HEP   Potential to Achieve Goals Fair   Potential Considerations Co-morbidities;Severity of impairments   SLP Home Exercise Plan provided today - pt has existing HEP from [facility] which SLP encouraged her to continue   Consulted and Agree with Plan of Care Patient  and husband, Casimiro Needle - 2014-10-25 1037    Functional Assessment Tool Used noms-4 (50%), skilled observation   Functional Limitations Motor speech  Motor Speech Current Status [id number]) At least 40 percent but less than 60 percent impaired, limited or restricted   Motor Speech Goal Status (W0981) At least 20 percent but less than 40 percent impaired, limited or restricted      Problem List Patient Active Problem List   Diagnosis Date Noted  . 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 Abnormal finding on MRI of brain 04/18/2014  . Cancer of brain 04/18/2014  . Abnormal brain MRI 04/13/2014  . Facial spasm 04/05/2014  . Tremor of both hands 04/05/2014  . Vertigo 04/05/2014  . BPPV (benign paroxysmal positional vertigo) 03/14/2014  . 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 Seizures 12/16/2013  . Epigastric pain 11/01/2013  . Fever blister 10/16/2013  . Unspecified hypothyroidism 12/13/2012  . Other and unspecified hyperlipidemia 12/13/2012  . Anxiety state, unspecified 12/13/2012  . Edema 12/13/2012  . Type II or unspecified type diabetes mellitus without mention of complication, not stated as uncontrolled   . Urinary incontinence   . DEPRESSION 09/24/2007  . Essential hypertension 09/24/2007  . GERD 09/24/2007  . HIATAL HERNIA 09/24/2007  . 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 Headache(784.0) 09/24/2007    Garald Balding, SLP 10/09/2014, 12:22 PM  [facility] [address] [place], [place], [address] Phone: [phone]   Fax:  [phone]

## 2014-10-09 NOTE — Therapy (Signed)
[facility] [address] [place], [place], [address] Phone: [phone]   Fax:  [phone]  Occupational Therapy Evaluation  Patient Details  Name: Kelly Fletcher MRN: [id number] Date of Birth: [birth year] Referring Provider:  Lynden Ang, MD  Encounter Date: 10/09/2014      OT End of Session - 10/09/14 1226    Visit Number 1  G1   Number of Visits 17   Date for OT Re-Evaluation 12/08/14   Authorization Type MCR - G code needed   Authorization Time Period 60 Days   OT Start Time 1020   OT Stop Time 1105   OT Time Calculation (min) 45 min   Activity Tolerance Patient tolerated treatment well      Past Medical History  Diagnosis Date  . Uterine prolapse   . Cystocele   . GERD (gastroesophageal reflux disease)   . Elevated cholesterol   . Rectocele   . Hypertension   . Arthritis   . Type II or unspecified type diabetes mellitus without mention of complication, uncontrolled     Type 2  . Urinary incontinence     Urgency  . Anxiety state, unspecified   . Palpitations   . Allergic rhinitis, cause unspecified   . Other and unspecified hyperlipidemia   . Unspecified hypothyroidism   . Edema   . Insomnia, unspecified   . Depressive disorder, not elsewhere classified   . Unspecified urinary incontinence   . Peptic ulcer, unspecified site, unspecified as acute or chronic, without mention of hemorrhage, perforation, or obstruction   . Disturbance of skin sensation   . Calculus of kidney   . Facial spasm 04/05/2014  . Abnormal brain MRI 04/13/2014  . 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 Tonsillectomy and adenoidectomy  1963  . Dilation and curettage of uterus      3-05  . Hysteroscopy      3-05  . Endometrial biopsy      Dr.Gottsegen   . Tongue biopsy      There were no  vitals filed for this visit.  Visit Diagnosis:  Weakness of right hand - Plan: Ot plan of care cert/re-cert  Weakness of right arm - Plan: Ot plan of care cert/re-cert  Lack of coordination - Plan: Ot plan of care cert/re-cert  Cognitive deficits - Plan: Ot plan of care cert/re-cert      Subjective Assessment - 10/09/14 1032    Patient is accompained by: Family member  husband   Pertinent History glioblastoma with brain surgery to remove tumor 06/28/14, facial seizures   Patient Stated Goals To get my Rt shoulder and hand better b/c I'm Rt handed   Currently in Pain? Yes   Pain Score 8    Pain Location Arm   Pain Orientation Right   Pain Descriptors / Indicators Aching;Throbbing   Pain Type Neuropathic pain   Pain Onset More than a month ago   Aggravating Factors  certain positions   Pain Relieving Factors massage, exercises           [facility] OT Assessment - 10/09/14 0001    Assessment   Diagnosis glioblastoma with brain surgery 06/28/14   Onset Date --  Nov 2015 began physical symptoms, began showing fatigue July   Assessment Pt hospitalized 06/27/14 - 07/26/14    Prior Therapy inpatient  rehab at [facility] (07/06/14 - 07/26/14), outpatient rehab at [facility]   Precautions   Precautions Fall  h/o DVT RLE, now with filter   Precaution Comments h/o facial seizures   Balance Screen   Has the patient fallen in the past 6 months No   Home  Environment   Family/patient expects to be discharged to: Private residence   Living Arrangements Spouse/significant other   Type of [place] One level   Bathroom Building control surveyor;Door   Home Equipment Shower seat;Bedside commode;Cane - single point;Walker - 2 wheels   Lives With Spouse  with 3 steps to enter 1 story house   Prior Function   Level of Independence Independent with basic ADLs;Independent with homemaking with ambulation  and driving prior to Fall 2015   Vocation Retired   ADL   ADL  comments Mod I eating/grooming with Lt non dominant hand, Min assist w/ dressing (assist with hooking bra and tying shoes), min assist to dry [place] after bathing, but can bathe while seated in shower seat Mod I level. Supervision for shower transfers. Dependent for IADLS at this time.    Mobility   Mobility Status --  with SPC   Written Expression   Dominant Hand Right   Handwriting Increased time;25% legible   for name only with built up pen   Vision - History   Baseline Vision Wears contact   Additional Comments denies change   Cognition   Behaviors Perseveration   Sensation   Light Touch Not tested  but reports decreased sensation Rt hand   Coordination   Gross Motor Movements are Fluid and Coordinated No   Fine Motor Movements are Fluid and Coordinated No   9 Hole Peg Test Right;Left   Right 9 Hole Peg Test only placed 1 peg in 60 sec. with multiple drops   Left 9 Hole Peg Test 33.09 sec   Box and Blocks Rt = 17   Tremors intentional tremors Rt hand noted with coordination tasks   Coordination Pt can pick up pen, but unable to manipulate in hand Rt   Edema   Edema mild Rt hand   ROM / Strength   AROM / PROM / Strength AROM;Strength   AROM   Overall AROM Comments LUE WNL's (but does c/o pain in thumb from overuse). RUE: Shoulder flex approx. 75%, abduction = 100* (approx. 50%), ER approx 90%, IR approx. 50% with pain at shoulder, elbow/forearm WFL's, composite finger flex 95%, extension WFL's, thumb opposition to 5th digit   Strength   Overall Strength Comments RUE MMT grossly 3-/5   Hand Function   Right Hand Grip (lbs) 10 lbs   Left Hand Grip (lbs) 55 lbs                           OT Short Term Goals - 10/09/14 1230    OT SHORT TERM GOAL #1   Title Independent with HEP for Rt hand coordination, RUE ROM and Rt hand strength (DUE 11/08/14)   Time 4   Period Weeks   Status New   OT SHORT TERM GOAL #2   Title Pt to verbalize understanding with pain  management strategies RUE   Time 4   Period Weeks   Status New   OT SHORT TERM GOAL #3   Title Pt to write name at 75% or greater legibility w/ A/E for pen prn Rt  dominant hand   Baseline approx. 25% first name only with built up pen   Time 4   Period Weeks   Status New   OT SHORT TERM GOAL #4   Title Improve RUE functional use as evidenced by performing 25 blocks or greater on Box & Blocks test   Baseline eval = 17   Time 4   Period Weeks   Status New   OT SHORT TERM GOAL #5   Title Pt to improve grip strength Rt hand to 20 lbs or greater to decrease drops   Baseline eval = 10 lbs (Lt = 55 lbs)   Time 4   Period Weeks   Status New   Additional Short Term Goals   Additional Short Term Goals Yes   OT SHORT TERM GOAL #6   Title Pt to tie shoes and hook bra mod I level incorporating Rt dominant hand for assist   Baseline dependent   Time 4   Period Weeks   Status New   OT SHORT TERM GOAL #7   Title Pt to use Rt dominant hand to feed Muro and groom [place] 50% of the time    Baseline none with Rt hand, using Lt non dominant hand   Time 4   Period Weeks   Status New           OT Long Term Goals - 10/09/14 1236    OT LONG TERM GOAL #1   Title Independent with updated HEP for RUE strength (DUE 12/08/14)   Time 8   Period Weeks   Status New   OT LONG TERM GOAL #2   Title Pt to perform high level reaching RUE to retrieve/replace 3 lb. object from high shelf 10 times w/o rest   Baseline only 75% shoulder flexion   Time 8   Period Weeks   Status New   OT LONG TERM GOAL #3   Title Improve RUE functional use as evidenced by performing 40 blocks on Box & Blocks test    Baseline eval = 17   Time 8   Period Weeks   Status New   OT LONG TERM GOAL #4   Title Improve coordination as evidenced by performing 9 hole peg test in under 90 sec.    Baseline eval: only able to place 1 peg in 60 sec.    Time 8   Period Weeks   Status New   OT LONG TERM GOAL #5   Title Pt to improve  grip strength Rt dominant hand to 30 lbs or greater to open jars/containers   Baseline eval = 10 lbs   Time 8   Period Weeks   Status New   Long Term Additional Goals   Additional Long Term Goals Yes   OT LONG TERM GOAL #6   Title Pt to return to using Rt hand as dominant hand for all BADLS 29% of the time or greater   Baseline only using LUE   Time 8   Period Weeks   Status New   OT LONG TERM GOAL #7   Title Pt to write short paragraph with Rt dominant hand using A/E prn with 90% legibility    Baseline 25% legibility for first name only   Time 8   Period Weeks   Status New   OT LONG TERM GOAL #8   Title Pt to return to light cooking/cleaning tasks mod I level with A/E and/or DME prn   Baseline dependent  Time 8   Period Weeks   Status New               Plan - 2014/10/29 1228    Clinical Impression Statement Pt is a 64 y.o. female who presents to outpatient rehab with decreased RUE strength and coordination following glioblastoma and brain resection 06/28/14. Pt with first physical symptoms beginning in Nov 2015.    Pt will benefit from skilled therapeutic intervention in order to improve on the following deficits (Retired) Decreased coordination;Decreased range of motion;Difficulty walking;Decreased safety awareness;Decreased endurance;Increased edema;Impaired sensation;Decreased activity tolerance;Decreased knowledge of precautions;Impaired tone;Decreased balance;Decreased knowledge of use of DME;Impaired UE functional use;Pain;Decreased cognition;Decreased mobility;Decreased strength   OT Frequency 2x / week   OT Duration 8 weeks  plus evaluation   OT Treatment/Interventions [place]-care/ADL training;Fluidtherapy;Moist Heat;DME and/or AE instruction;Splinting;Patient/family education;Therapeutic exercises;Balance training;Therapeutic activities;Neuromuscular education;Functional Mobility Training;Passive range of motion;Cognitive remediation/compensation;Electrical  Stimulation;[place]ra[place];Energy conservation;Manual Therapy   Plan coordination/putty HEP   Consulted and Agree with Plan of Care Patient;Family member/caregiver   Family Member Consulted HUSBAND          G-Codes - 10/29/2014 1246    Functional Assessment Tool Used RUE: Grip = 10 lbs, Box & Blocks = 17, 9 hole peg = only 1 peg placed   Functional Limitation Carrying, moving and handling objects   Carrying, Moving and Handling Objects Current Status [phone]) At least 80 percent but less than 100 percent impaired, limited or restricted   Carrying, Moving and Handling Objects Goal Status (F7902) At least 20 percent but less than 40 percent impaired, limited or restricted      Problem List Patient Active Problem List   Diagnosis Date Noted  . 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 Abnormal finding on MRI of brain 04/18/2014  . Cancer of brain 04/18/2014  . Abnormal brain MRI 04/13/2014  . Facial spasm 04/05/2014  . Tremor of both hands 04/05/2014  . Vertigo 04/05/2014  . BPPV (benign paroxysmal positional vertigo) 03/14/2014  . 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 Seizures 12/16/2013  . Epigastric pain 11/01/2013  . Fever blister 10/16/2013  . Unspecified hypothyroidism 12/13/2012  . Other and unspecified hyperlipidemia 12/13/2012  . Anxiety state, unspecified 12/13/2012  . Edema 12/13/2012  . Type II or unspecified type diabetes mellitus without mention of complication, not stated as uncontrolled   . Urinary incontinence   . DEPRESSION 09/24/2007  . Essential hypertension 09/24/2007  . GERD 09/24/2007  . HIATAL HERNIA 09/24/2007  . 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 Headache(784.0) 09/24/2007    Carey Bullocks, OTR/L 2014-10-29, 12:53 PM  [facility] [address] [place], [place], [address] Phone: [phone]   Fax:  [phone]

## 2014-10-09 NOTE — Addendum Note (Signed)
Addended by: Garald Balding B on: 10/09/2014 12:44 PM   Modules accepted: Orders

## 2014-10-10 ENCOUNTER — Encounter: Payer: Self-pay | Admitting: Physical Therapy

## 2014-10-10 ENCOUNTER — Ambulatory Visit: Payer: Medicare Other | Admitting: Physical Therapy

## 2014-10-10 DIAGNOSIS — M6289 Other specified disorders of muscle: Secondary | ICD-10-CM | POA: Diagnosis not present

## 2014-10-10 DIAGNOSIS — R278 Other lack of coordination: Secondary | ICD-10-CM

## 2014-10-10 DIAGNOSIS — R2689 Other abnormalities of gait and mobility: Secondary | ICD-10-CM

## 2014-10-10 DIAGNOSIS — R6889 Other general symptoms and signs: Secondary | ICD-10-CM

## 2014-10-10 DIAGNOSIS — R269 Unspecified abnormalities of gait and mobility: Secondary | ICD-10-CM

## 2014-10-10 NOTE — Therapy (Signed)
[facility] [address] [place], [place], [address] Phone: [phone]   Fax:  [phone]  Physical Therapy Evaluation  Patient Details  Name: Kelly Fletcher MRN: [id number] Date of Birth: 1951-05-03 Referring Provider:  Lynden Ang, MD  Encounter Date: 10/10/2014      PT End of Session - 10/10/14 1338    Visit Number 1   Number of Visits 17   Date for PT Re-Evaluation 12/10/14   Authorization Type G-code & KX modifier   PT Start Time 1230   PT Stop Time 1320   PT Time Calculation (min) 50 min   Equipment Utilized During Treatment Gait belt   Activity Tolerance Patient tolerated treatment well   Behavior During Therapy [facility] for tasks assessed/performed      Past Medical History  Diagnosis Date  . Uterine prolapse   . Cystocele   . GERD (gastroesophageal reflux disease)   . Elevated cholesterol   . Rectocele   . Hypertension   . Arthritis   . Type II or unspecified type diabetes mellitus without mention of complication, uncontrolled     Type 2  . Urinary incontinence     Urgency  . Anxiety state, unspecified   . Palpitations   . Allergic rhinitis, cause unspecified   . Other and unspecified hyperlipidemia   . Unspecified hypothyroidism   . Edema   . Insomnia, unspecified   . Depressive disorder, not elsewhere classified   . Unspecified urinary incontinence   . Peptic ulcer, unspecified site, unspecified as acute or chronic, without mention of hemorrhage, perforation, or obstruction   . Disturbance of skin sensation   . Calculus of kidney   . Facial spasm 04/05/2014  . Abnormal brain MRI 04/13/2014  . 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 Tonsillectomy and adenoidectomy  1963  . Dilation and curettage of uterus      3-05  . Hysteroscopy      3-05  . Endometrial biopsy       Dr.Gottsegen   . Tongue biopsy      There were no vitals filed for this visit.  Visit Diagnosis:  Abnormality of gait  Decreased functional activity tolerance  Balance problems  Coordination abnormal      Subjective Assessment - 10/10/14 1237    Subjective This 64yo female began having symptoms in Fall 2015 wtih veritgo & facial spasms /seizure, dropping things. She was diagnosed with brain tumor in Nov. 2016 & 06/28/14 Glioblastoma diagnosed with biopsy during craniotomy. She had radiation (finished 09/19/14) & chemotherapy (on 5 days straight, then off 6-28 days). She had inpatient rehab at [facility] with discharge 07/26/14. She had some PT & OT outpatient  at [facility]. She was referred to [facility] since returning home.   Patient is accompained by: Family member   Patient Stated Goals I want to walk better especially stairs, endurance, multi-task like talk & walk   Currently in Pain? Yes   Pain Score 7    Pain Location Arm   Pain Orientation Right   Pain Descriptors / Indicators Aching;Throbbing   Pain Type Neuropathic pain   Pain Onset More than a month ago   Aggravating Factors  positions   Pain Relieving Factors movement   Multiple Pain Sites No  [facility] PT Assessment - 10/10/14 1230    Assessment   Medical Diagnosis Glioblastoma   Onset Date 06/28/14   Precautions   Precautions Fall  history of seizures / facial spasms   Restrictions   Weight Bearing Restrictions No   Balance Screen   Has the patient fallen in the past 6 months No   Has the patient had a decrease in activity level because of a fear of falling?  No   Is the patient reluctant to leave their home because of a fear of falling?  No   Home Environment   Living Enviornment Private residence   Living Arrangements Spouse/significant other  3.5yo granddaughter comes over ~ 5 days /wk   Type of [place] to enter   Entrance Stairs-Number of Steps 4   Entrance  Stairs-Rails None   Home Layout One level   [place] - single point;Bedside commode;Shower seat;Wheelchair - manual   Prior Function   Level of Independence Independent with basic ADLs;Independent with homemaking with ambulation;Independent with gait;Independent with transfers  no device full community, babysat granddaughter   Vocation Retired   Regulatory affairs officer Test right LE mild dysmetria    Posture/Postural Control   Posture/Postural Control No significant limitations   Tone   Assessment Location Other (comment)  appears WFL   ROM / Strength   AROM / PROM / Strength AROM   AROM   Overall AROM  Within functional limits for tasks performed   Strength   Overall Strength Comments Bil LE grossly 5/5   Transfers   Transfers Sit to Stand;Stand to Sit   Sit to Stand 5: Supervision;Without upper extremity assist;From chair/3-in-1  SBA to stabilize   Stand to Sit 5: Supervision;Without upper extremity assist;To chair/3-in-1   Ambulation/Gait   Ambulation/Gait Yes   Ambulation Distance (Feet) 300 Feet   Assistive device None   Gait Pattern Step-through pattern;Decreased stance time - right;Decreased hip/knee flexion - right   Ambulation Surface Indoor;Level   Gait velocity 3.08 ft/sec   Stairs Yes   Stairs Assistance 5: Supervision;4: Min guard  SBA rail, min Guard no rail   Stair Management Technique One rail Left;Alternating pattern;No rails;Step to pattern   Number of Stairs 4  2 reps   Berg Balance Test   Sit to Stand Able to stand without using hands and stabilize independently   Standing Unsupported Able to stand safely 2 minutes   Sitting with Back Unsupported but Feet Supported on Floor or Stool Able to sit safely and securely 2 minutes   Stand to Sit Sits safely with minimal use of hands   Transfers Able to transfer safely, minor use of hands   Standing Unsupported with Eyes Closed Able to stand 10 seconds with  supervision   Standing Ubsupported with Feet Together Able to place feet together independently and stand for 1 minute with supervision   From Standing, Reach Forward with Outstretched Arm Can reach forward >12 cm safely (5")   From Standing Position, Pick up Object from Floor Able to pick up shoe, needs supervision   From Standing Position, Turn to Look Behind Over each Shoulder Looks behind one side only/other side shows less weight shift   Turn 360 Degrees Able to turn 360 degrees safely but slowly   Standing Unsupported, Alternately Place Feet on Step/Stool Able to complete 4 steps without aid or supervision   Standing Unsupported, One Foot  in [place] to take small step independently and hold 30 seconds   Standing on One Leg Tries to lift leg/unable to hold 3 seconds but remains standing independently   Total Score 42   Timed Up and Go Test   Normal TUG (seconds) 14.76  no device   Cognitive TUG (seconds) 20.94  no device   Functional Gait  Assessment   Gait Level Surface Walks 20 ft in less than 5.5 sec, no assistive devices, good speed, no evidence for imbalance, normal gait pattern, deviates no more than 6 in outside of the 12 in walkway width.   Change in Gait Speed Able to smoothly change walking speed without loss of balance or gait deviation. Deviate no more than 6 in outside of the 12 in walkway width.   Gait with Horizontal Head Turns Performs head turns smoothly with slight change in gait velocity (eg, minor disruption to smooth gait path), deviates 6-10 in outside 12 in walkway width, or uses an assistive device.   Gait with Vertical Head Turns Performs task with slight change in gait velocity (eg, minor disruption to smooth gait path), deviates 6 - 10 in outside 12 in walkway width or uses assistive device   Gait and Pivot Turn Turns slowly, requires verbal cueing, or requires several small steps to catch balance following turn and stop   Step Over Obstacle Is able to step  over one shoe box (4.5 in total height) but must slow down and adjust steps to clear box safely. May require verbal cueing.   Gait with Narrow Base of Support Ambulates less than 4 steps heel to toe or cannot perform without assistance.   Gait with Eyes Closed Walks 20 ft, slow speed, abnormal gait pattern, evidence for imbalance, deviates 10-15 in outside 12 in walkway width. Requires more than 9 sec to ambulate 20 ft.   Ambulating Backwards Walks 20 ft, slow speed, abnormal gait pattern, evidence for imbalance, deviates 10-15 in outside 12 in walkway width.   Steps Alternating feet, must use rail.   Total Score 16                             PT Short Term Goals - 10/10/14 1345    PT SHORT TERM GOAL #1   Title patient correcty demonstrates initial HEP with husband cueing. (Target Date: 11/09/2014)   Time 4   Period Weeks   Status New   PT SHORT TERM GOAL #2   Title Timed Up & Go <13.5 sec without device (Target Date: 11/09/2014)   Time 4   Period Weeks   Status New   PT SHORT TERM GOAL #3   Title Berg Balance >46/56 (Target Date: 11/09/2014)   Time 4   Period Weeks   Status New   PT SHORT TERM GOAL #4   Title ambulates 500' without device including grass surfaces, ramps & curbs with supervision. (Target Date: 11/09/2014)   Time 4   Period Weeks   Status New           PT Long Term Goals - 10/10/14 1354    PT LONG TERM GOAL #1   Title demonstrates / verbalizes understanding of ongoing HEP / fitness plan. (Target Date: 12/07/2014)   Time 8   Period Weeks   Status New   PT LONG TERM GOAL #2   Title Cognitive Timed Up & Go <15 sec safely  (Target Date: 12/07/2014)  Time 8   Period Weeks   Status New   PT LONG TERM GOAL #3   Title Berg Balance >/=52/56  (Target Date: 12/07/2014)   Time 8   Period Weeks   Status New   PT LONG TERM GOAL #4   Title Functional Gait Assessment >24/ 30  (Target Date: 12/07/2014)   Time 8   Period Weeks   Status New   PT LONG  TERM GOAL #5   Title ambulates >1000' without device including grass, ramps, curbs, stairs indpendently.  (Target Date: 12/07/2014)   Time 8   Period Weeks   Status New               Plan - 2014-10-27 1339    Clinical Impression Statement This 64yo female has made excellent recovery with new onset glioblastoma. She has potential for further recovery / rehab with additional therapies. Functional tests were dependent gait with velocity of 3.08 ft/sec, Berg Balance 42/56 (<45/56 indicates fall risk), Timed Up & Go 14.76 sec no device (>13.5 sec indicates fall risk); cognitive TUG 20/94 sec indicating fall risk and Functional Gait Assessment 16/30 (Fall risk).                     Pt will benefit from skilled therapeutic intervention in order to improve on the following deficits Abnormal gait;Decreased activity tolerance;Decreased balance;Decreased coordination;Decreased endurance;Decreased mobility   Rehab Potential Good   PT Frequency 2x / week   PT Duration 8 weeks   PT Treatment/Interventions ADLs/[facility];DME Instruction;Gait training;Stair training;Functional mobility training;Therapeutic activities;Therapeutic exercise;Balance training;Neuromuscular re-education;Patient/family education   PT Next Visit Plan HEP for balance, coordination, multi-tasking gait   Consulted and Agree with Plan of Care Patient;Family member/caregiver   Family Member Consulted husband          G-Codes - Oct 27, 2014 1358    Functional Assessment Tool Used Functional Gait Assessment 16/30   Functional Limitation Mobility: Walking and moving around   Mobility: Walking and Moving Around Current Status [phone]) At least 40 percent but less than 60 percent impaired, limited or restricted   Mobility: Walking and Moving Around Goal Status [phone]) At least 1 percent but less than 20 percent impaired, limited or restricted       Problem List Patient Active Problem List   Diagnosis Date Noted  .  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 Abnormal finding on MRI of brain 04/18/2014  . Cancer of brain 04/18/2014  . Abnormal brain MRI 04/13/2014  . Facial spasm 04/05/2014  . Tremor of both hands 04/05/2014  . Vertigo 04/05/2014  . BPPV (benign paroxysmal positional vertigo) 03/14/2014  . 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 Seizures 12/16/2013  . Epigastric pain 11/01/2013  . Fever blister 10/16/2013  . Unspecified hypothyroidism 12/13/2012  . Other and unspecified hyperlipidemia 12/13/2012  . Anxiety state, unspecified 12/13/2012  . Edema 12/13/2012  . Type II or unspecified type diabetes mellitus without mention of complication, not stated as uncontrolled   . Urinary incontinence   . DEPRESSION 09/24/2007  . Essential hypertension 09/24/2007  . GERD 09/24/2007  . HIATAL HERNIA 09/24/2007  . 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 JJOACZYS(063.0) 09/24/2007    Daiya Tamer PT, DPT 10-27-14, 2:00 PM  [facility] [address] [place], [place], [address] Phone: [phone]   Fax:  [phone]

## 2014-10-12 ENCOUNTER — Telehealth: Payer: Self-pay | Admitting: Oncology

## 2014-10-12 NOTE — Telephone Encounter (Signed)
new patient appt-s/w patient husband Saralyn Pilar and gave np appt for 06/14 @ 1:30 w/Dr. Benay Spice

## 2014-10-18 ENCOUNTER — Ambulatory Visit: Payer: Medicare Other | Admitting: Speech Pathology

## 2014-10-18 ENCOUNTER — Ambulatory Visit: Payer: Medicare Other | Admitting: Occupational Therapy

## 2014-10-18 ENCOUNTER — Encounter: Payer: Self-pay | Admitting: Occupational Therapy

## 2014-10-18 DIAGNOSIS — R609 Edema, unspecified: Secondary | ICD-10-CM

## 2014-10-18 DIAGNOSIS — M25511 Pain in right shoulder: Secondary | ICD-10-CM

## 2014-10-18 DIAGNOSIS — R29898 Other symptoms and signs involving the musculoskeletal system: Secondary | ICD-10-CM

## 2014-10-18 DIAGNOSIS — M792 Neuralgia and neuritis, unspecified: Secondary | ICD-10-CM

## 2014-10-18 DIAGNOSIS — M6289 Other specified disorders of muscle: Secondary | ICD-10-CM | POA: Diagnosis not present

## 2014-10-18 DIAGNOSIS — R279 Unspecified lack of coordination: Secondary | ICD-10-CM

## 2014-10-18 NOTE — Patient Instructions (Signed)
Edema massage and bed positioning for RUE to alleviate pain (sidelying and supine)

## 2014-10-18 NOTE — Therapy (Signed)
[facility] [address] [place], [place], [address] Phone: [phone]   Fax:  [phone]  Occupational Therapy Treatment  Patient Details  Name: Kelly Fletcher MRN: [id number] Date of Birth: 1950-10-03 Referring Provider:  Estill Dooms, MD  Encounter Date: 10/18/2014      OT End of Session - 10/18/14 1219    Visit Number 2   Number of Visits 17   Date for OT Re-Evaluation 12/08/14   Authorization Type MCR - G code needed   Authorization Time Period 60 Days   OT Start Time 0931   OT Stop Time 1015   OT Time Calculation (min) 44 min   Activity Tolerance Patient tolerated treatment well      Past Medical History  Diagnosis Date  . Uterine prolapse   . Cystocele   . GERD (gastroesophageal reflux disease)   . Elevated cholesterol   . Rectocele   . Hypertension   . Arthritis   . Type II or unspecified type diabetes mellitus without mention of complication, uncontrolled     Type 2  . Urinary incontinence     Urgency  . Anxiety state, unspecified   . Palpitations   . Allergic rhinitis, cause unspecified   . Other and unspecified hyperlipidemia   . Unspecified hypothyroidism   . Edema   . Insomnia, unspecified   . Depressive disorder, not elsewhere classified   . Unspecified urinary incontinence   . Peptic ulcer, unspecified site, unspecified as acute or chronic, without mention of hemorrhage, perforation, or obstruction   . Disturbance of skin sensation   . Calculus of kidney   . Facial spasm 04/05/2014  . Abnormal brain MRI 04/13/2014  . 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 Tonsillectomy and adenoidectomy  1963  . Dilation and curettage of uterus      3-05  . Hysteroscopy      3-05  . Endometrial biopsy      Dr.Gottsegen   . Tongue biopsy      There were no vitals filed  for this visit.  Visit Diagnosis:  Weakness of right hand  Weakness of right arm  Lack of coordination  Pain in joint, shoulder region, right  Edema  Intractable neuropathic pain of right hand      Subjective Assessment - 10/18/14 0936    Subjective  the pain in my arm and hand is getting worse and now my hand is swollen and tight   Patient is accompained by: Family member  husband   Pertinent History glioblastoma with brain surgery to remove tumor 06/28/14, facial seizures   Patient Stated Goals To get my Rt shoulder and hand better b/c I'm Rt handed   Currently in Pain? Yes   Pain Score 6    Pain Location Hand   Pain Orientation Right   Pain Descriptors / Indicators Throbbing;Tightness;Numbness   Pain Type Neuropathic pain   Pain Onset More than a month ago   Pain Frequency Intermittent   Aggravating Factors  Using it   Pain Relieving Factors ice, heat (doesn't help as much), tylenol   Multiple Pain Sites Yes   Pain Score 8   Pain Location Arm   Pain Orientation Right   Pain Descriptors / Indicators Aching   Pain Type Chronic pain   Pain Onset 1 to 4  weeks ago  2 weeks ago   Aggravating Factors  moving or using R arm (L shoulder sore due to overuse)   Pain Relieving Factors not moving it or using it.                       OT Treatments/Exercises (OP) - 10/18/14 0001    ADLs   ADL Comments Edema massage to R hand to reduce swelling with good results. Demonstrated technique to pt and husband - able to return demonstrate. Folllowed by active flexion/extension of hand and emphasized this with pt and husband. Also addressed  bed positioning relative to postitioning the RUE to alleviate pain in shoulder and hand.    Neurological Re-education Exercises   Other Exercises 1 Neuro re ed to address active shoulder flexion/extension in closed chain activity. Pt requires mod facilitation to maintain good shoulder girdle alignment to allow movement without pain  therefore did not give pt HEP for shoulder yet.    Modalities   Modalities Moist Heat  simultaneously with edema massage to hand   Moist Heat Therapy   Number Minutes Moist Heat 8 Minutes   Moist Heat Location Shoulder   Manual Therapy   Manual Therapy Joint mobilization;Soft tissue mobilization   Manual therapy comments Pt with siginficant pain in shoulder girdle and referred pain down arm.  Joint mob and soft tissue release to address tight capsule and increased tone to allow improved alignment and less pain during active  movement in RUE  Pt reported at end of session less pain in shoulder, arm and hand.                OT Education - 10/18/14 1216    Education provided Yes   Education Details edema massage, bed positioing in supine and sidelying to alleviate shoulder pain   Person(s) Educated Patient;Spouse   Methods Explanation;Demonstration;Verbal cues;Handout   Comprehension Verbalized understanding;Returned demonstration          OT Short Term Goals - 10/18/14 1217    OT SHORT TERM GOAL #1   Title Independent with HEP for Rt hand coordination, RUE ROM and Rt hand strength (DUE 11/08/14)   Time 4   Period Weeks   Status New   OT SHORT TERM GOAL #2   Title Pt to verbalize understanding with pain management strategies RUE   Time 4   Period Weeks   Status New   OT SHORT TERM GOAL #3   Title Pt to write name at 75% or greater legibility w/ A/E for pen prn Rt dominant hand   Baseline approx. 25% first name only with built up pen   Time 4   Period Weeks   Status New   OT SHORT TERM GOAL #4   Title Improve RUE functional use as evidenced by performing 25 blocks or greater on Box & Blocks test   Baseline eval = 17   Time 4   Period Weeks   Status New   OT SHORT TERM GOAL #5   Title Pt to improve grip strength Rt hand to 20 lbs or greater to decrease drops   Baseline eval = 10 lbs (Lt = 55 lbs)   Time 4   Period Weeks   Status New   OT SHORT TERM GOAL #6    Title Pt to tie shoes and hook bra mod I level incorporating Rt dominant hand for assist   Baseline dependent   Time 4   Period Weeks  Status New   OT SHORT TERM GOAL #7   Title Pt to use Rt dominant hand to feed [place] and groom [place] 50% of the time    Baseline none with Rt hand, using Lt non dominant hand   Time 4   Period Weeks   Status New           OT Long Term Goals - 10/18/14 1217    OT LONG TERM GOAL #1   Title Independent with updated HEP for RUE strength (DUE 12/08/14)   Time 8   Period Weeks   Status New   OT LONG TERM GOAL #2   Title Pt to perform high level reaching RUE to retrieve/replace 3 lb. object from high shelf 10 times w/o rest   Baseline only 75% shoulder flexion   Time 8   Period Weeks   Status New   OT LONG TERM GOAL #3   Title Improve RUE functional use as evidenced by performing 40 blocks on Box & Blocks test    Baseline eval = 17   Time 8   Period Weeks   Status New   OT LONG TERM GOAL #4   Title Improve coordination as evidenced by performing 9 hole peg test in under 90 sec.    Baseline eval: only able to place 1 peg in 60 sec.    Time 8   Period Weeks   Status New   OT LONG TERM GOAL #5   Title Pt to improve grip strength Rt dominant hand to 30 lbs or greater to open jars/containers   Baseline eval = 10 lbs   Time 8   Period Weeks   Status New   OT LONG TERM GOAL #6   Title Pt to return to using Rt hand as dominant hand for all BADLS 14% of the time or greater   Baseline only using LUE   Time 8   Period Weeks   Status New   OT LONG TERM GOAL #7   Title Pt to write short paragraph with Rt dominant hand using A/E prn with 90% legibility    Baseline 25% legibility for first name only   Time 8   Period Weeks   Status New   OT LONG TERM GOAL #8   Title Pt to return to light cooking/cleaning tasks mod I level with A/E and/or DME prn   Baseline dependent   Time 8   Period Weeks   Status New               Plan - 10/18/14  1217    Clinical Impression Statement Pt wtih progressing toward goals. Pt is very motivated. Will need to aggressively address pain issues for pt to alllow progress   Pt will benefit from skilled therapeutic intervention in order to improve on the following deficits (Retired) Decreased coordination;Decreased range of motion;Difficulty walking;Decreased safety awareness;Decreased endurance;Increased edema;Impaired sensation;Decreased activity tolerance;Decreased knowledge of precautions;Impaired tone;Decreased balance;Decreased knowledge of use of DME;Impaired UE functional use;Pain;Decreased cognition;Decreased mobility;Decreased strength   Rehab Potential Good   Clinical Impairments Affecting Rehab Potential Pt will benefit from skilled OT to address deficits (see above)   OT Frequency 2x / week   OT Duration 8 weeks   OT Treatment/Interventions Storer-care/ADL training;Fluidtherapy;Moist Heat;DME and/or AE instruction;Splinting;Patient/family education;Therapeutic exercises;Balance training;Therapeutic activities;Neuromuscular education;Functional Mobility Training;Passive range of motion;Cognitive remediation/compensation;Electrical Stimulation;Parrafin;Energy conservation;Manual Therapy   Plan address pain in hand , shoulder and arm, neuro re ed to RUE, check swelling   Consulted and  Agree with Plan of Care Patient        Problem List Patient Active Problem List   Diagnosis Date Noted  . 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 Abnormal finding on MRI of brain 04/18/2014  . Cancer of brain 04/18/2014  . Abnormal brain MRI 04/13/2014  . Facial spasm 04/05/2014  . Tremor of both hands 04/05/2014  . Vertigo 04/05/2014  . BPPV (benign paroxysmal positional vertigo) 03/14/2014  . 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 Seizures 12/16/2013  . Epigastric pain 11/01/2013  . Fever blister 10/16/2013  . Unspecified hypothyroidism 12/13/2012  . Other and unspecified hyperlipidemia 12/13/2012  .  Anxiety state, unspecified 12/13/2012  . Edema 12/13/2012  . Type II or unspecified type diabetes mellitus without mention of complication, not stated as uncontrolled   . Urinary incontinence   . DEPRESSION 09/24/2007  . Essential hypertension 09/24/2007  . GERD 09/24/2007  . HIATAL HERNIA 09/24/2007  . 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 Headache(784.0) 09/24/2007    Quay Burow, OTR/L 10/18/2014, 12:24 PM  [facility] [address] [place], [place], [address] Phone: [phone]   Fax:  [phone]

## 2014-10-19 ENCOUNTER — Ambulatory Visit: Payer: Medicare Other

## 2014-10-19 DIAGNOSIS — M6289 Other specified disorders of muscle: Secondary | ICD-10-CM | POA: Diagnosis not present

## 2014-10-19 DIAGNOSIS — R4701 Aphasia: Secondary | ICD-10-CM

## 2014-10-19 NOTE — Patient Instructions (Signed)
  Please complete the assigned speech therapy homework prior to your next session.  

## 2014-10-19 NOTE — Therapy (Signed)
[facility] [address] [place], [place], [address] Phone: [phone]   Fax:  [phone]  Speech Language Pathology Treatment  Patient Details  Name: Kelly Fletcher MRN: [id number] Date of Birth: 04-Jun-1950 Referring Provider:  Estill Dooms, MD  Encounter Date: 10/19/2014      End of Session - 10/19/14 1659    Visit Number 2   Number of Visits 16   Date for SLP Re-Evaluation 12/08/14   SLP Start Time 1150   SLP Stop Time  1231   SLP Time Calculation (min) 41 min   Activity Tolerance Patient tolerated treatment well      Past Medical History  Diagnosis Date  . Uterine prolapse   . Cystocele   . GERD (gastroesophageal reflux disease)   . Elevated cholesterol   . Rectocele   . Hypertension   . Arthritis   . Type II or unspecified type diabetes mellitus without mention of complication, uncontrolled     Type 2  . Urinary incontinence     Urgency  . Anxiety state, unspecified   . Palpitations   . Allergic rhinitis, cause unspecified   . Other and unspecified hyperlipidemia   . Unspecified hypothyroidism   . Edema   . Insomnia, unspecified   . Depressive disorder, not elsewhere classified   . Unspecified urinary incontinence   . Peptic ulcer, unspecified site, unspecified as acute or chronic, without mention of hemorrhage, perforation, or obstruction   . Disturbance of skin sensation   . Calculus of kidney   . Facial spasm 04/05/2014  . Abnormal brain MRI 04/13/2014  . 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 Tonsillectomy and adenoidectomy  1963  . Dilation and curettage of uterus      3-05  . Hysteroscopy      3-05  . Endometrial biopsy      Dr.Gottsegen   . Tongue biopsy      There were no vitals filed for this visit.  Visit Diagnosis: Aphasia      Subjective  Assessment - 10/19/14 1158    Subjective Pt has been practicing HEP (phrases/sentences) x2-3/day   Patient is accompained by: --  niece, Marcie Bal               ADULT SLP TREATMENT - 10/19/14 1158    General Information   Behavior/Cognition Alert;Cooperative;Pleasant mood   Treatment Provided   Treatment provided Cognitive-Linquistic   Pain Assessment   Pain Assessment No/denies pain   Cognitive-Linquistic Treatment   Treatment focused on Dysarthria   Skilled Treatment SLP assisted pt with reiterating that slower speech necessary to compensate for apraxic-like symptoms. Pt reportedly very [place] conscious re: all aspects of changes after glioblastoma effects and rarely exits house. SLP facilitated practice with pt re: compensations most helpful for patient in speech: slow rate and overarticulation. Pt successful in sentences approx 70% of the time wiht occasional verbal and demo cues from SLP (mod cues). In conversation, pt with minmal/no use of compensations between tasks or between stimuli in one task.   Assessment / Recommendations / Plan   Plan Continue with current plan of care   Progression Toward Goals   Progression toward goals Progressing toward goals          SLP Education - 10/19/14 1659    Education provided Yes  Education Details need to use speech compensations   Person(s) Educated Patient;Caregiver(s)   Methods Explanation;Demonstration   Comprehension Verbalized understanding;Returned demonstration;Need further instruction;Verbal cues required          SLP Short Term Goals - 10/19/14 1701    SLP SHORT TERM GOAL #1   Title pt will complete HEP for motor speech planning with slow rate 95% success with rare verbal cues   Time 4   Period Weeks   Status On-going   SLP SHORT TERM GOAL #2   Title pt will produce sentences using speech compensations with 85% fluency   Time 4   Period Weeks   Status On-going   SLP SHORT TERM GOAL #3   Title pt will tell SLP  three compensations for improving motor planning for speech   Time 4   Period Weeks   Status On-going   SLP SHORT TERM GOAL #4   Title pt to participate in 5 minutes simple conversation with 85% fluency with modified independence   Time 4   Period Weeks   Status On-going          SLP Long Term Goals - 10/19/14 1701    SLP LONG TERM GOAL #1   Title pt will participate in 10 mintues mod complex conversation 90% fluency, with modified independence (compensations)   Time 8   Period Weeks   Status On-going   SLP LONG TERM GOAL #2   Title pt will complete HEP with 90% fluency and modifed independence (compensations)   Time 8   Period Weeks   Status On-going   SLP LONG TERM GOAL #3   Title pt will participate in 7 minutes complex conversation with 85% fluency with modified independence   Time 8   Period Weeks   Status On-going          Plan - 10/19/14 1659    Clinical Impression Statement Pt cont to present with apraxia-like symptoms although slightly improved over evaluation. Pt cont to require skilled ST to maximize speech/language skills and improve pt's QOL, as she remains tearful when talking about differences post hospitalization.   Speech Therapy Frequency 2x / week   Duration --  8 weeks   Treatment/Interventions Cueing hierarchy;Functional tasks;Patient/family education;Compensatory strategies;SLP instruction and feedback   Potential to Achieve Goals Fair   Potential Considerations Co-morbidities;Severity of impairments        Problem List Patient Active Problem List   Diagnosis Date Noted  . 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 Abnormal finding on MRI of brain 04/18/2014  . Cancer of brain 04/18/2014  . Abnormal brain MRI 04/13/2014  . Facial spasm 04/05/2014  . Tremor of both hands 04/05/2014  . Vertigo 04/05/2014  . BPPV (benign paroxysmal positional vertigo) 03/14/2014  . 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 Seizures 12/16/2013  . Epigastric pain  11/01/2013  . Fever blister 10/16/2013  . Unspecified hypothyroidism 12/13/2012  . Other and unspecified hyperlipidemia 12/13/2012  . Anxiety state, unspecified 12/13/2012  . Edema 12/13/2012  . Type II or unspecified type diabetes mellitus without mention of complication, not stated as uncontrolled   . Urinary incontinence   . DEPRESSION 09/24/2007  . Essential hypertension 09/24/2007  . GERD 09/24/2007  . HIATAL HERNIA 09/24/2007  . 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 Headache(784.0) 09/24/2007    Ashritha Desrosiers , [place], [place]  10/19/2014, 5:02 PM  [facility] [address] [place], [place], [address] Phone: [phone]   Fax:  [phone]

## 2014-10-22 ENCOUNTER — Other Ambulatory Visit: Payer: Self-pay | Admitting: Internal Medicine

## 2014-10-22 DIAGNOSIS — R350 Frequency of micturition: Secondary | ICD-10-CM

## 2014-10-23 ENCOUNTER — Encounter: Payer: Self-pay | Admitting: Occupational Therapy

## 2014-10-23 ENCOUNTER — Ambulatory Visit: Payer: Medicare Other

## 2014-10-23 ENCOUNTER — Ambulatory Visit: Payer: Medicare Other | Admitting: Occupational Therapy

## 2014-10-23 ENCOUNTER — Other Ambulatory Visit: Payer: Medicare Other

## 2014-10-23 ENCOUNTER — Ambulatory Visit (INDEPENDENT_AMBULATORY_CARE_PROVIDER_SITE_OTHER): Payer: Medicare Other | Admitting: Internal Medicine

## 2014-10-23 ENCOUNTER — Encounter: Payer: Self-pay | Admitting: Internal Medicine

## 2014-10-23 VITALS — BP 160/84 | HR 68 | Temp 98.1°F | Resp 20 | Ht 69.0 in | Wt 168.2 lb

## 2014-10-23 DIAGNOSIS — I10 Essential (primary) hypertension: Secondary | ICD-10-CM

## 2014-10-23 DIAGNOSIS — C719 Malignant neoplasm of brain, unspecified: Secondary | ICD-10-CM | POA: Diagnosis not present

## 2014-10-23 DIAGNOSIS — R4701 Aphasia: Secondary | ICD-10-CM

## 2014-10-23 DIAGNOSIS — B373 Candidiasis of vulva and vagina: Secondary | ICD-10-CM

## 2014-10-23 DIAGNOSIS — B3731 Acute candidiasis of vulva and vagina: Secondary | ICD-10-CM

## 2014-10-23 DIAGNOSIS — R350 Frequency of micturition: Secondary | ICD-10-CM

## 2014-10-23 DIAGNOSIS — M25511 Pain in right shoulder: Secondary | ICD-10-CM

## 2014-10-23 DIAGNOSIS — R29898 Other symptoms and signs involving the musculoskeletal system: Secondary | ICD-10-CM

## 2014-10-23 DIAGNOSIS — R609 Edema, unspecified: Secondary | ICD-10-CM

## 2014-10-23 DIAGNOSIS — M6289 Other specified disorders of muscle: Secondary | ICD-10-CM | POA: Diagnosis not present

## 2014-10-23 DIAGNOSIS — E119 Type 2 diabetes mellitus without complications: Secondary | ICD-10-CM | POA: Diagnosis not present

## 2014-10-23 HISTORY — DX: Acute candidiasis of vulva and vagina: B37.31

## 2014-10-23 HISTORY — DX: Candidiasis of vulva and vagina: B37.3

## 2014-10-23 LAB — POCT URINALYSIS DIPSTICK
BILIRUBIN UA: NEGATIVE
Glucose, UA: NEGATIVE
Ketones, UA: NEGATIVE
Leukocytes, UA: NEGATIVE
Nitrite, UA: NEGATIVE
PH UA: 6.5
PROTEIN UA: NEGATIVE
RBC UA: NEGATIVE
Spec Grav, UA: >=1.03
Urobilinogen, UA: 0.2

## 2014-10-23 MED ORDER — FLUCONAZOLE 150 MG PO TABS: ORAL_TABLET | ORAL | Status: DC

## 2014-10-23 MED ORDER — MICONAZOLE NITRATE 4 % VA CREA: TOPICAL_CREAM | VAGINAL | Status: DC

## 2014-10-23 NOTE — Progress Notes (Signed)
Patient ID: Kelly Fletcher, female   DOB: January 19, 1951, 64 y.o.   MRN: [id number]    Facility  [facility]    Place of Service:   OFFICE    Allergies  Allergen Reactions  . Shellfish Allergy Anaphylaxis  . Ibuprofen   . Latex     Skin rash  . Naproxen   . Nitrofurantoin Monohyd Macro   . Paroxetine Hcl   . Prednisone   . Prozac [Fluoxetine Hcl]   . Tegretol [Carbamazepine] Hives    Chief Complaint  Patient presents with  . Acute Visit    HPI:  Monilial vaginitis: Patient has had elevated blood sugars in. Time that she has been on Decadron. She is being tapered off of Decadron. She denies previous history of used infections. Currently she has burning in the vaginal area, labia, with urination, and perianal area. She has not noticed any particular rash.  Urine frequency -present for about 2 weeks  Type 2 diabetes mellitus without complication: Generally controlled. Currently being tapered off Decadron and only taking 0.5 mg every other day.  Essential hypertension: Controlled    Medications: Patient's Medications  New Prescriptions   No medications on file  Previous Medications   ATORVASTATIN (LIPITOR) 10 MG TABLET    ONE DAILY TO CONTROL CHOLESTEROL   B COMPLEX VITAMINS TABLET    Take 1 tablet by mouth daily.   CHOLECALCIFEROL (VITAMIN D) 1000 UNITS TABLET    Take 1,000 Units by mouth daily. Takes 2000-503m daily   DEXAMETHASONE (DECADRON) 2 MG TABLET    Take 1 tablet (2 mg total) by mouth 3 (three) times daily.   DOCUSATE SODIUM (COLACE) 100 MG CAPSULE    Take 100 mg by mouth 2 (two) times daily.   ESCITALOPRAM (LEXAPRO) 10 MG TABLET    Take 10 mg by mouth daily.   ESOMEPRAZOLE (NEXIUM) 40 MG CAPSULE    Take 1 capsule (40 mg total) by mouth daily at 12 noon. To reduce stomach acid   FISH OIL-OMEGA-3 FATTY ACIDS 1000 MG CAPSULE    Take 2 g by mouth daily.   LACOSAMIDE (VIMPAT) 200 MG TABS TABLET    Take 200 mg by mouth 2 (two) times daily.   LEVETIRACETAM (KEPPRA) 750 MG  TABLET    750 mg bid po.   LEVOTHYROXINE (SYNTHROID, LEVOTHROID) 125 MCG TABLET    TAKE 1 TABLET (125 MCG TOTAL) BY MOUTH DAILY BEFORE BREAKFAST.   METFORMIN (GLUCOPHAGE-XR) 500 MG 24 HR TABLET    Take 500 mg by mouth 2 (two) times daily. To control blood sugar   METFORMIN (GLUCOPHAGE-XR) 500 MG 24 HR TABLET    TAKE 1 TABLET TWICE DAILY TO CONTROL BLOOD SUGAR   ONE TOUCH ULTRA TEST TEST STRIP    CHECK BLOOD GLUCOSE AS DIRECTED   RESVERATROL PO    Take by mouth. WITH D    SENNOSIDES-DOCUSATE SODIUM (SENOKOT-S) 8.6-50 MG TABLET    Take 1 tablet by mouth daily.   VALACYCLOVIR (VALTREX) 1000 MG TABLET    Take 1,000 mg by mouth 2 (two) times daily as needed. To help resolve viral blisters  Modified Medications   No medications on file  Discontinued Medications   CO-ENZYME Q-10 30 MG CAPSULE    Take 30 mg by mouth daily.    DIAZEPAM (VALIUM) 2 MG TABLET    Take 1 tablet (2 mg total) by mouth every 6 (six) hours as needed for anxiety, muscle spasms or sedation.   LAMOTRIGINE (LAMICTAL XR) 25 MG  TB24 TABLET    Take 89m daily for two weeks, then take 570mdaily for two weeks, then will transition to 10058maily thereafter.   LAMOTRIGINE 100 MG TB24    Take 100 mg by mouth daily. After titration is complete   LAMOTRIGINE 25 & 50 & 100 MG KIT    Take 25 mg by mouth 1 day or 1 dose.     Review of Systems  Constitutional: Positive for fatigue. Negative for fever, chills, activity change and appetite change.  HENT: Positive for tinnitus. Negative for ear discharge and ear pain.   Eyes: Negative.  Negative for visual disturbance.  Respiratory: Negative.   Cardiovascular: Negative.   Gastrointestinal: Negative.        Epigastric discomfort  Endocrine: Negative.   Genitourinary: Positive for frequency.  Musculoskeletal: Negative.   Skin: Negative.   Allergic/Immunologic: Negative.   Neurological: Positive for dizziness and seizures (Last seizure was about February 2016.).       Glioblastoma frontal  lobes. Status post resection. Has had chemotherapy. Continues to have mild tremor of the right hand, dysarthria, and weakness of the right side more prominent in the upper extremity.  Hematological: Negative.   Psychiatric/Behavioral:       Chronic stress    Filed Vitals:   10/23/14 1319  BP: 160/84  Pulse: 68  Temp: 98.1 F (36.7 C)  TempSrc: Oral  Resp: 20  Height: 5' 9"  (1.753 m)  Weight: 168 lb 3.2 oz (76.295 kg)  SpO2: 95%   Body mass index is 24.83 kg/(m^2).  Physical Exam  Constitutional: She is oriented to person, place, and time. She appears well-developed and well-nourished. No distress.  HENT:  Nose: Nose normal.  Mouth/Throat: No oropharyngeal exudate.  Eyes: Conjunctivae and EOM are normal. Pupils are equal, round, and reactive to light. Left eye exhibits no discharge. Right eye exhibits no nystagmus. Left eye exhibits no nystagmus.  Neck: No JVD present. No tracheal deviation present. No thyromegaly present.  Cardiovascular: Normal rate, regular rhythm, normal heart sounds and intact distal pulses.  Exam reveals no gallop and no friction rub.   No murmur heard. Pulmonary/Chest: Effort normal and breath sounds normal. No respiratory distress. She has no wheezes. She has no rales.  Abdominal: She exhibits no distension and no mass. There is no tenderness.  Genitourinary:  Mild erythema at the labia and introitus. No discharge.  Musculoskeletal: Normal range of motion. She exhibits no edema or tenderness.  Lymphadenopathy:    She has no cervical adenopathy.  Neurological: She is alert and oriented to person, place, and time. She has normal reflexes. No cranial nerve deficit. Coordination normal.  Dysarthria. Right hemiparesis. Right arm tremor.  Skin: No rash noted. No erythema. No pallor.  3 benign appearing moles on labia. No areas suggest melanoma. Large scar left parietal area secondary to previous brain surgery.  Psychiatric: She has a normal mood and affect.  Her behavior is normal. Judgment and thought content normal.     Labs reviewed: No visits with results within 3 Month(s) from this visit. Latest known visit with results is:  Hospital Outpatient Visit on 05/23/2014  Component Date Value Ref Range Status  . Glucose-Capillary 05/23/2014 103* 70 - 99 mg/dL Final     Assessment/Plan  1. Urine frequency - Urinalysis - Urine culture - POC Urinalysis Dipstick  2. Monilial vaginitis - fluconazole (DIFLUCAN) 150 MG tablet; One daily to treat yeast  Dispense: 1 tablet; Refill: 0 - MICONAZOLE NITRATE VAGINAL 4 %  CREA; Apply one applicator nightly for 3 nights  Dispense: 25 g; Refill: 1  3. Type 2 diabetes mellitus without complication Last lab work done at [facility]. Glucose 111 mg percent  4. Essential hypertension Controlled  5. Malignant glioma of brain Maintenance treatment. She has an appointment to see Dr. Benay Spice

## 2014-10-23 NOTE — Therapy (Signed)
[facility] [address] [place], [place], [address] Phone: [phone]   Fax:  [phone]  Speech Language Pathology Treatment  Patient Details  Name: Kelly Fletcher MRN: [id number] Date of Birth: 10/31/1950 Referring Provider:  Estill Dooms, MD  Encounter Date: 10/23/2014      End of Session - 10/23/14 1108    Visit Number 3   Number of Visits 16   Date for SLP Re-Evaluation 12/08/14   SLP Start Time 13   SLP Stop Time  1101   SLP Time Calculation (min) 42 min   Activity Tolerance Patient tolerated treatment well      Past Medical History  Diagnosis Date  . Uterine prolapse   . Cystocele   . GERD (gastroesophageal reflux disease)   . Elevated cholesterol   . Rectocele   . Hypertension   . Arthritis   . Type II or unspecified type diabetes mellitus without mention of complication, uncontrolled     Type 2  . Urinary incontinence     Urgency  . Anxiety state, unspecified   . Palpitations   . Allergic rhinitis, cause unspecified   . Other and unspecified hyperlipidemia   . Unspecified hypothyroidism   . Edema   . Insomnia, unspecified   . Depressive disorder, not elsewhere classified   . Unspecified urinary incontinence   . Peptic ulcer, unspecified site, unspecified as acute or chronic, without mention of hemorrhage, perforation, or obstruction   . Disturbance of skin sensation   . Calculus of kidney   . Facial spasm 04/05/2014  . Abnormal brain MRI 04/13/2014  . 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 Tonsillectomy and adenoidectomy  1963  . Dilation and curettage of uterus      3-05  . Hysteroscopy      3-05  . Endometrial biopsy      Dr.Gottsegen   . Tongue biopsy      There were no vitals filed for this visit.  Visit Diagnosis: Aphasia      Subjective  Assessment - 10/23/14 1027    Subjective Pt cont to practice homework - average 2-3 times/day. Pt going to [facility] to receive 5 doses (one per day) on Friday.               ADULT SLP TREATMENT - 10/23/14 1024    General Information   Behavior/Cognition Alert;Pleasant mood;Cooperative   Treatment Provided   Treatment provided Cognitive-Linquistic   Pain Assessment   Pain Assessment No/denies pain   Cognitive-Linquistic Treatment   Treatment focused on Dysarthria   Skilled Treatment Pt/SLP discussion re: pt's cancer treatment schedule. Pt with slowed rate and occasional halting speech, but functional. Pt is using compensations discussed previous session. Pt read paragraphs with 80% use of compensations. Intelligibility 100%. In simple conversation pt demo'd compensations with approx 70% success, intelligibility 90%+.     Assessment / Recommendations / Plan   Plan Continue with current plan of care   Progression Toward Goals   Progression toward goals Progressing toward goals            SLP Short Term Goals - 10/23/14 1109    SLP SHORT TERM GOAL #1   Title pt will complete HEP for motor speech planning with slow rate 95% success with rare verbal cues   Time 3  Period Weeks   Status On-going   SLP SHORT TERM GOAL #2   Title pt will produce sentences using speech compensations with 85% fluency   Time 3   Period Weeks   Status On-going   SLP SHORT TERM GOAL #3   Title pt will tell SLP three compensations for improving motor planning for speech   Time 3   Period Weeks   Status On-going   SLP SHORT TERM GOAL #4   Title pt to participate in 5 minutes simple conversation with 85% fluency with modified independence   Time 3   Period Weeks   Status On-going          SLP Long Term Goals - 10/23/14 1109    SLP LONG TERM GOAL #1   Title pt will participate in 10 mintues mod complex conversation 90% fluency, with modified independence (compensations)   Time 7   Period  Weeks   Status On-going   SLP LONG TERM GOAL #2   Title pt will complete HEP with 90% fluency and modifed independence (compensations)   Time 7   Period Weeks   Status On-going   SLP LONG TERM GOAL #3   Title pt will participate in 7 minutes complex conversation with 85% fluency with modified independence   Time 7   Period Weeks   Status On-going          Plan - 10/23/14 1108    Clinical Impression Statement Pt cont to present with apraxia-like symptoms although slightly improved over evaluation. Pt cont to require skilled ST to maximize speech/language skills and improve pt's QOL, as she remains tearful when talking about differences post hospitalization.   Speech Therapy Frequency 2x / week   Duration --  7 weeks   Treatment/Interventions Cueing hierarchy;Functional tasks;Patient/family education;Compensatory strategies;SLP instruction and feedback   Potential to Achieve Goals Fair   Potential Considerations Co-morbidities;Severity of impairments        Problem List Patient Active Problem List   Diagnosis Date Noted  . 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 Abnormal finding on MRI of brain 04/18/2014  . Cancer of brain 04/18/2014  . Abnormal brain MRI 04/13/2014  . Facial spasm 04/05/2014  . Tremor of both hands 04/05/2014  . Vertigo 04/05/2014  . BPPV (benign paroxysmal positional vertigo) 03/14/2014  . 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 Seizures 12/16/2013  . Epigastric pain 11/01/2013  . Fever blister 10/16/2013  . Unspecified hypothyroidism 12/13/2012  . Other and unspecified hyperlipidemia 12/13/2012  . Anxiety state, unspecified 12/13/2012  . Edema 12/13/2012  . Type II or unspecified type diabetes mellitus without mention of complication, not stated as uncontrolled   . Urinary incontinence   . DEPRESSION 09/24/2007  . Essential hypertension 09/24/2007  . GERD 09/24/2007  . HIATAL HERNIA 09/24/2007  . 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 Headache(784.0)  09/24/2007    SCHINKE,CARL , [place], [place]  10/23/2014, 11:10 AM  [facility] [address] [place], [place], [address] Phone: [phone]   Fax:  [phone]

## 2014-10-23 NOTE — Therapy (Signed)
[facility] [address] [place], [place], [address] Phone: [phone]   Fax:  [phone]  Occupational Therapy Treatment  Patient Details  Name: Kelly Fletcher MRN: [id number] Date of Birth: Dec 29, 1950 Referring Provider:  Estill Dooms, MD  Encounter Date: 10/23/2014      OT End of Session - 10/23/14 1427    Visit Number 3   Number of Visits 17   Date for OT Re-Evaluation 12/08/14   Authorization Type MCR - G code needed   Authorization Time Period 60 Days, (week 1/8)   OT Start Time 0845   OT Stop Time 0930   OT Time Calculation (min) 45 min   Activity Tolerance Patient tolerated treatment well      Past Medical History  Diagnosis Date  . Uterine prolapse   . Cystocele   . GERD (gastroesophageal reflux disease)   . Elevated cholesterol   . Rectocele   . Hypertension   . Arthritis   . Type II or unspecified type diabetes mellitus without mention of complication, uncontrolled     Type 2  . Urinary incontinence     Urgency  . Anxiety state, unspecified   . Palpitations   . Allergic rhinitis, cause unspecified   . Other and unspecified hyperlipidemia   . Unspecified hypothyroidism   . Edema   . Insomnia, unspecified   . Depressive disorder, not elsewhere classified   . Unspecified urinary incontinence   . Peptic ulcer, unspecified site, unspecified as acute or chronic, without mention of hemorrhage, perforation, or obstruction   . Disturbance of skin sensation   . Calculus of kidney   . Facial spasm 04/05/2014  . Abnormal brain MRI 04/13/2014  . 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 Monilial vaginitis 10/23/2014    Past Surgical History  Procedure Laterality Date  . Tonsillectomy and adenoidectomy  1963  . Dilation and curettage of uterus      3-05  . Hysteroscopy      3-05  . Endometrial biopsy      Dr.Gottsegen   .  Tongue biopsy      There were no vitals filed for this visit.  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 Yes   Pain Score 8    Pain Location Arm   Pain Orientation Right   Pain Descriptors / Indicators Tightness   Pain Type Neuropathic pain   Pain Onset More than a month ago   Pain Frequency Intermittent   Aggravating Factors  using it   Pain Relieving Factors ice, heat, tylenol                      OT Treatments/Exercises (OP) - 10/23/14 0001    ADLs   ADL Comments Pt instructed in proper positioning of RUE at rest while in bed and with open chain reaching. Pt issued bed positioning handout and reviewed with pt/husband. Pt also  issued compression glove for Rt hand edema. Pt/husband instructed on how to properly and safely don due to decreased sensation. Instructed pt to get assistance from husband to don compression glove (for pm use); pt/husband agreed. *Pt/husband also strongly cautioned against use of heat until speaking with pt's oncologist and getting clearance for use of heat before applying (due to active chemo, recent h/o CA). Pt/husband agreed   Neurological Re-education Exercises   Other Exercises 1 Supine: AA/ROM in shoulder flexion bilaterally with PVC frame to promote proper shoulder alignment and reduce pain Rt shoulder. Pt initially had pain, but after 2 repetitions and cues, pt with no pain during activity. Pt then shown how to perform with [facility] for home (using Rt hand on hook for neutral shoulder rotation) and no pain.    Other Exercises 2 Encouraged trunk  extension and scapula retraction while seated t/o day and pt return demo. Pt also shown shoulder extension/scapula retraction exercise using SPC cane while standing (bilaterally) with Rt hand on hook for neutral rotation. Pt return demo with cues, however husband present for education to ensure proper positioning at home.                 OT Education - 10/23/14 1426    Education provided Yes   Education Details bed positioning handout, compression glove wear and care, cane ex's (sh. flex supine, sh. ext standing)   Person(s) Educated Patient;Spouse   Methods Explanation;Demonstration   Comprehension Verbalized understanding;Returned demonstration          OT Short Term Goals - 10/18/14 1217    OT SHORT TERM GOAL #1   Title Independent with HEP for Rt hand coordination, RUE ROM and Rt hand strength (DUE 11/08/14)   Time 4   Period Weeks   Status New   OT SHORT TERM GOAL #2   Title Pt to verbalize understanding with pain management strategies RUE   Time 4   Period Weeks   Status New   OT SHORT TERM GOAL #3   Title Pt to write name at 75% or greater legibility w/ A/E for pen prn Rt dominant hand   Baseline approx. 25% first name only with built up pen   Time 4   Period Weeks   Status New   OT SHORT TERM GOAL #4   Title Improve RUE functional use as evidenced by performing 25 blocks or greater on Box & Blocks test   Baseline eval = 17   Time 4   Period Weeks   Status New   OT SHORT TERM GOAL #5   Title Pt to improve grip strength Rt hand to 20 lbs or greater to decrease drops   Baseline eval = 10 lbs (Lt = 55 lbs)   Time 4   Period Weeks   Status New   OT SHORT TERM GOAL #6   Title Pt to tie shoes and hook bra mod I level incorporating Rt dominant hand for assist   Baseline dependent   Time 4   Period Weeks   Status New   OT SHORT TERM GOAL #7   Title Pt to use Rt dominant hand to feed [place] and [place] 50% of the time    Baseline none with Rt hand, using Lt  non dominant hand   Time 4   Period Weeks   Status New           OT Long Term Goals - 10/18/14 1217    OT LONG TERM  GOAL #1   Title Independent with updated HEP for RUE strength (DUE 12/08/14)   Time 8   Period Weeks   Status New   OT LONG TERM GOAL #2   Title Pt to perform high level reaching RUE to retrieve/replace 3 lb. object from high shelf 10 times w/o rest   Baseline only 75% shoulder flexion   Time 8   Period Weeks   Status New   OT LONG TERM GOAL #3   Title Improve RUE functional use as evidenced by performing 40 blocks on Box & Blocks test    Baseline eval = 17   Time 8   Period Weeks   Status New   OT LONG TERM GOAL #4   Title Improve coordination as evidenced by performing 9 hole peg test in under 90 sec.    Baseline eval: only able to place 1 peg in 60 sec.    Time 8   Period Weeks   Status New   OT LONG TERM GOAL #5   Title Pt to improve grip strength Rt dominant hand to 30 lbs or greater to open jars/containers   Baseline eval = 10 lbs   Time 8   Period Weeks   Status New   OT LONG TERM GOAL #6   Title Pt to return to using Rt hand as dominant hand for all BADLS 32% of the time or greater   Baseline only using LUE   Time 8   Period Weeks   Status New   OT LONG TERM GOAL #7   Title Pt to write short paragraph with Rt dominant hand using A/E prn with 90% legibility    Baseline 25% legibility for first name only   Time 8   Period Weeks   Status New   OT LONG TERM GOAL #8   Title Pt to return to light cooking/cleaning tasks mod I level with A/E and/or DME prn   Baseline dependent   Time 8   Period Weeks   Status New               Plan - 10/23/14 1430    Clinical Impression Statement Pt approximating STG #2. Pt limited by shoulder and hand pain, however can reduce shoulder pain signifcantly with proper positioning   Plan simulate feeding with modifications for RUE, practice handwriting, low to mid level open chain reaching, coordination,  review cane ex's   Consulted and Agree with Plan of Care Patient;Family member/caregiver   Family Member Consulted HUSBAND        Problem List Patient Active Problem List   Diagnosis Date Noted  . Monilial vaginitis 10/23/2014  . Deep vein thrombosis 08/12/2014  . History of inferior vena caval filter placement 07/14/2014  . Malignant glioma of brain 06/28/2014  . Diabetes mellitus, type 2 06/27/2014  . 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 Abnormal finding on MRI of brain 04/18/2014  . Cancer of brain 04/18/2014  . Abnormal brain MRI 04/13/2014  . Facial spasm 04/05/2014  . Tremor of both hands 04/05/2014  . Vertigo 04/05/2014  . BPPV (benign paroxysmal positional vertigo) 03/14/2014  . 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 Seizures 12/16/2013  . Epigastric pain 11/01/2013  . Fever blister 10/16/2013  . Unspecified hypothyroidism 12/13/2012  . Other and unspecified hyperlipidemia 12/13/2012  . Anxiety state, unspecified 12/13/2012  . Edema 12/13/2012  . Anxiety state 12/13/2012  . Adult hypothyroidism 12/13/2012  . Type II or unspecified type diabetes mellitus without mention  of complication, not stated as uncontrolled   . Urinary incontinence   . DEPRESSION 09/24/2007  . Essential hypertension 09/24/2007  . GERD 09/24/2007  . HIATAL HERNIA 09/24/2007  . 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 Headache(784.0) 09/24/2007    Carey Bullocks, OTR/L 10/23/2014, 2:37 PM  [facility] [address] [place], [place], [address] Phone: [phone]   Fax:  [phone]

## 2014-10-24 LAB — URINALYSIS
Bilirubin, UA: NEGATIVE
Glucose, UA: NEGATIVE
Ketones, UA: NEGATIVE
Nitrite, UA: NEGATIVE
PH UA: 5.5 (ref 5.0–7.5)
Protein, UA: NEGATIVE
RBC UA: NEGATIVE
Specific Gravity, UA: 1.016 (ref 1.005–1.030)
UUROB: 0.2 mg/dL (ref 0.2–1.0)

## 2014-10-24 LAB — URINE CULTURE

## 2014-10-25 ENCOUNTER — Ambulatory Visit: Payer: Medicare Other

## 2014-10-25 DIAGNOSIS — M6289 Other specified disorders of muscle: Secondary | ICD-10-CM | POA: Diagnosis not present

## 2014-10-25 DIAGNOSIS — R4701 Aphasia: Secondary | ICD-10-CM

## 2014-10-25 NOTE — Therapy (Signed)
[facility] [address] [place], [place], [address] Phone: [phone]   Fax:  [phone]  Speech Language Pathology Treatment  Patient Details  Name: Kelly Fletcher MRN: [id number] Date of Birth: [birth year] Referring Provider:  Estill Dooms, MD  Encounter Date: 10/25/2014      End of Session - 10/25/14 1140    Visit Number 4   Number of Visits 16   Date for SLP Re-Evaluation 12/08/14   SLP Start Time 53   SLP Stop Time  1146   SLP Time Calculation (min) 40 min   Activity Tolerance Patient tolerated treatment well      Past Medical History  Diagnosis Date  . Uterine prolapse   . Cystocele   . GERD (gastroesophageal reflux disease)   . Elevated cholesterol   . Rectocele   . Hypertension   . Arthritis   . Type II or unspecified type diabetes mellitus without mention of complication, uncontrolled     Type 2  . Urinary incontinence     Urgency  . Anxiety state, unspecified   . Palpitations   . Allergic rhinitis, cause unspecified   . Other and unspecified hyperlipidemia   . Unspecified hypothyroidism   . Edema   . Insomnia, unspecified   . Depressive disorder, not elsewhere classified   . Unspecified urinary incontinence   . Peptic ulcer, unspecified site, unspecified as acute or chronic, without mention of hemorrhage, perforation, or obstruction   . Disturbance of skin sensation   . Calculus of kidney   . Facial spasm 04/05/2014  . Abnormal brain MRI 04/13/2014  . 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 Monilial vaginitis 10/23/2014    Past Surgical History  Procedure Laterality Date  . Tonsillectomy and adenoidectomy  1963  . Dilation and curettage of uterus      3-05  . Hysteroscopy      3-05  . Endometrial biopsy      Dr.Gottsegen   . Tongue biopsy      There were no vitals filed for this visit.  Visit Diagnosis:  Aphasia      Subjective Assessment - 10/25/14 1110    Subjective "That was hard"               ADULT SLP TREATMENT - 10/25/14 1110    General Information   Behavior/Cognition Alert;Cooperative;Pleasant mood   Treatment Provided   Treatment provided Cognitive-Linquistic   Pain Assessment   Pain Assessment 0-10   Pain Score 7    Pain Location hand   Pain Descriptors / Indicators Aching  stiffness   Pain Intervention(s) Monitored during session   Cognitive-Linquistic Treatment   Treatment focused on Apraxia   Skilled Treatment Pt with conversation prior to beginning structured ST tasks with good succes at speech fluidity. In sentence repetition tasks, pt with >95% accuracy in average 8-9 word sentences, >85% using compensation strategies. In short answer tasks, pt with functional speech, intelligibilty >90% with approx 75% strategy use. SLP noted pt using comensations approx 80% of the time in sentence completion tasks. Pt tearful x1 during session today.   Assessment / Recommendations / Plan   Plan Continue with current plan of care   Progression Toward Goals   Progression toward goals Not progressing toward goals (comment)  pt with greater frequency of anomia today  SLP Short Term Goals - 10/25/14 1141    SLP SHORT TERM GOAL #1   Title pt will complete HEP for motor speech planning with slow rate 95% success with rare verbal cues   Time 3   Period Weeks   Status On-going   SLP SHORT TERM GOAL #2   Title pt will produce sentences using speech compensations with 85% fluency   Time 3   Period Weeks   Status On-going   SLP SHORT TERM GOAL #3   Title pt will tell SLP three compensations for improving motor planning for speech   Time 3   Period Weeks   Status On-going   SLP SHORT TERM GOAL #4   Title pt to participate in 5 minutes simple conversation with 85% fluency with modified independence   Time 3   Period Weeks   Status On-going           SLP Long Term Goals - 10/25/14 1142    SLP LONG TERM GOAL #1   Title pt will participate in 10 mintues mod complex conversation 90% fluency, with modified independence (compensations)   Time 7   Period Weeks   Status On-going   SLP LONG TERM GOAL #2   Title pt will complete HEP with 90% fluency and modifed independence (compensations)   Time 7   Period Weeks   Status On-going   SLP LONG TERM GOAL #3   Title pt will participate in 7 minutes complex conversation with 85% fluency with modified independence   Time 7   Period Weeks   Status On-going          Plan - 10/25/14 1141    Clinical Impression Statement Pt cont to present with apraxia-like symptoms although slightly improved over evaluation. Pt cont to require skilled ST to maximize speech/language skills and improve pt's QOL, as she remains tearful at times during therapy sessions.   Speech Therapy Frequency 2x / week   Duration --  7 weeks   Treatment/Interventions Cueing hierarchy;Functional tasks;Patient/family education;Compensatory strategies;SLP instruction and feedback   Potential to Achieve Goals Fair   Potential Considerations Co-morbidities;Severity of impairments        Problem List Patient Active Problem List   Diagnosis Date Noted  . Monilial vaginitis 10/23/2014  . Deep vein thrombosis 08/12/2014  . History of inferior vena caval filter placement 07/14/2014  . Malignant glioma of brain 06/28/2014  . Diabetes mellitus, type 2 06/27/2014  . 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 Tremor of both hands 04/05/2014  . Vertigo 04/05/2014  . BPPV (benign paroxysmal positional vertigo) 03/14/2014  . Seizures 12/16/2013  . Epigastric pain 11/01/2013  . Fever blister 10/16/2013  . Unspecified hypothyroidism 12/13/2012  . Other and unspecified hyperlipidemia 12/13/2012  . Anxiety state, unspecified 12/13/2012  . Edema 12/13/2012  . Anxiety state 12/13/2012  . Adult hypothyroidism 12/13/2012  . Type II  or unspecified type diabetes mellitus without mention of complication, not stated as uncontrolled   . Urinary incontinence   . DEPRESSION 09/24/2007  . Essential hypertension 09/24/2007  . GERD 09/24/2007  . HIATAL HERNIA 09/24/2007  . 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 Headache(784.0) 09/24/2007    Davey Bergsma , [place], [place]  10/25/2014, 11:52 AM  [facility] [address] [place], [place], [address] Phone: [phone]   Fax:  [phone]

## 2014-10-31 ENCOUNTER — Ambulatory Visit: Payer: Medicare Other

## 2014-10-31 ENCOUNTER — Ambulatory Visit: Payer: Medicare Other | Attending: Radiology | Admitting: Physical Therapy

## 2014-10-31 ENCOUNTER — Encounter: Payer: Self-pay | Admitting: Physical Therapy

## 2014-10-31 DIAGNOSIS — M6289 Other specified disorders of muscle: Secondary | ICD-10-CM | POA: Insufficient documentation

## 2014-10-31 DIAGNOSIS — R278 Other lack of coordination: Secondary | ICD-10-CM | POA: Insufficient documentation

## 2014-10-31 DIAGNOSIS — R6889 Other general symptoms and signs: Secondary | ICD-10-CM | POA: Insufficient documentation

## 2014-10-31 DIAGNOSIS — R4701 Aphasia: Secondary | ICD-10-CM | POA: Insufficient documentation

## 2014-10-31 DIAGNOSIS — R2689 Other abnormalities of gait and mobility: Secondary | ICD-10-CM

## 2014-10-31 DIAGNOSIS — R4189 Other symptoms and signs involving cognitive functions and awareness: Secondary | ICD-10-CM | POA: Insufficient documentation

## 2014-10-31 DIAGNOSIS — R609 Edema, unspecified: Secondary | ICD-10-CM | POA: Diagnosis present

## 2014-10-31 DIAGNOSIS — R29818 Other symptoms and signs involving the nervous system: Secondary | ICD-10-CM | POA: Insufficient documentation

## 2014-10-31 DIAGNOSIS — G8191 Hemiplegia, unspecified affecting right dominant side: Secondary | ICD-10-CM | POA: Diagnosis present

## 2014-10-31 DIAGNOSIS — M25511 Pain in right shoulder: Secondary | ICD-10-CM | POA: Diagnosis present

## 2014-10-31 DIAGNOSIS — R269 Unspecified abnormalities of gait and mobility: Secondary | ICD-10-CM | POA: Diagnosis present

## 2014-10-31 DIAGNOSIS — R29898 Other symptoms and signs involving the musculoskeletal system: Secondary | ICD-10-CM | POA: Insufficient documentation

## 2014-10-31 DIAGNOSIS — R279 Unspecified lack of coordination: Secondary | ICD-10-CM | POA: Diagnosis present

## 2014-10-31 NOTE — Patient Instructions (Signed)
Feet Together (Compliant Surface) Head Motion - Eyes Closed   In corner with chair in front of you: Stand on compliant surface: pillows with feet together. Close eyes and: 1. move head slowly, up and down. 2. Move head slowly, left and right  Repeat _10___ times each way. Do _1-2___ sessions per day.  Copyright  VHI. All rights reserved.  Feet Heel-Toe "Tandem"   At counter top as needed for balance:arms outstretched, walk a straight line forward bringing one foot directly in front of the other and then backwards bringing one leg behind the other one. Repeat for 3 laps each way. Do __1-2__ sessions per day.  Copyright  VHI. All rights reserved.  "I love a Database administrator   At counter as needed for balance: high knee marches forward and then backwards.  Repeat _3__ laps each way. Do _1-2___ sessions per day.  http://gt2.exer.us/345   Copyright  VHI. All rights reserved.  Walking on Heels   At counter: Walk on heels forward and then backward. Repeat 3 laps each way. 1-2 times a day.  Copyright  VHI. All rights reserved.  Walking on Toes   At counter: Walk on toes forward and then backwards, 3 laps each way. Perform this 1-2 times a day.  Copyright  VHI. All rights reserved.  Figure Eight   Walk in a figure eight pattern. Can use paper cups as markers for where to turn. Repeat __2__ times per session. Do _1-2___ sessions per day.   Copyright  VHI. All rights reserved.

## 2014-10-31 NOTE — Patient Instructions (Signed)
Continue to practice slower speech with the speech activities, to make it easier to speak slower at home.

## 2014-10-31 NOTE — Therapy (Signed)
[facility] [address] [place], [place], [address] Phone: [phone]   Fax:  [phone]  Speech Language Pathology Treatment  Patient Details  Name: Kelly Fletcher MRN: [id number] Date of Birth: 01/04/1951 Referring Provider:  Estill Dooms, MD  Encounter Date: 10/31/2014      End of Session - 10/31/14 1356    Visit Number 5   Number of Visits 16   Date for SLP Re-Evaluation 12/08/14   SLP Start Time 1316   SLP Stop Time  1400   SLP Time Calculation (min) 44 min   Activity Tolerance Patient tolerated treatment well      Past Medical History  Diagnosis Date  . Uterine prolapse   . Cystocele   . GERD (gastroesophageal reflux disease)   . Elevated cholesterol   . Rectocele   . Hypertension   . Arthritis   . Type II or unspecified type diabetes mellitus without mention of complication, uncontrolled     Type 2  . Urinary incontinence     Urgency  . Anxiety state, unspecified   . Palpitations   . Allergic rhinitis, cause unspecified   . Other and unspecified hyperlipidemia   . Unspecified hypothyroidism   . Edema   . Insomnia, unspecified   . Depressive disorder, not elsewhere classified   . Unspecified urinary incontinence   . Peptic ulcer, unspecified site, unspecified as acute or chronic, without mention of hemorrhage, perforation, or obstruction   . Disturbance of skin sensation   . Calculus of kidney   . Facial spasm 04/05/2014  . Abnormal brain MRI 04/13/2014  . 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 Monilial vaginitis 10/23/2014    Past Surgical History  Procedure Laterality Date  . Tonsillectomy and adenoidectomy  1963  . Dilation and curettage of uterus      3-05  . Hysteroscopy      3-05  . Endometrial biopsy      Dr.Gottsegen   . Tongue biopsy      There were no vitals filed for this visit.  Visit Diagnosis:  Aphasia      Subjective Assessment - 10/31/14 1320    Subjective "I wish I could get off of (some of the medicine) soon"               ADULT SLP TREATMENT - 10/31/14 1321    General Information   Behavior/Cognition Alert;Cooperative;Pleasant mood   Treatment Provided   Treatment provided Cognitive-Linquistic   Pain Assessment   Pain Assessment 0-10   Pain Score 9    Pain Location hand   Pain Descriptors / Indicators Aching   Pain Intervention(s) Monitored during session   Cognitive-Linquistic Treatment   Treatment focused on Apraxia   Skilled Treatment Pt reporting that her pain is distracting her ST today.  Short answer responses with 80% success with compensations and 70% success with fluidity of speech with occasional demo and verbal cues. In sequencing tasks (multisentence) . Pt tells SLP today that when she gets home it is harder to keep more relaxed mindset like she does in therapy, which is challenging to cont slowed rate and exaggeration of speech movements in that setting. Pt reports practicing approx 60 minutes per day, SLP suggested 45 minutes per day. Pt was able to tell SLP three compensations for smoother speech.   Assessment / Recommendations / Plan   Plan  Continue with current plan of care            SLP Short Term Goals - 10/31/14 1357    SLP SHORT TERM GOAL #1   Title pt will complete HEP for motor speech planning with slow rate 95% success with rare verbal cues   Time 2   Period Weeks   Status On-going   SLP SHORT TERM GOAL #2   Title pt will produce sentences using speech compensations with 85% fluency   Time 2   Period Weeks   Status On-going   SLP SHORT TERM GOAL #3   Title pt will tell SLP three compensations for improving motor planning for speech   Time --   Period --   Status Achieved   SLP SHORT TERM GOAL #4   Title pt to participate in 5 minutes simple conversation with 85% fluency with modified independence   Time 2   Period Weeks    Status On-going          SLP Long Term Goals - 10/31/14 1358    SLP LONG TERM GOAL #1   Title pt will participate in 10 mintues mod complex conversation 90% fluency, with modified independence (compensations)   Time 6   Period Weeks   Status On-going   SLP LONG TERM GOAL #2   Title pt will complete HEP with 90% fluency and modifed independence (compensations)   Time 6   Period Weeks   Status On-going   SLP LONG TERM GOAL #3   Title pt will participate in 7 minutes complex conversation with 85% fluency with modified independence   Time 6   Period Weeks   Status On-going          Plan - 10/31/14 1400    Clinical Impression Statement 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 Pt cont to require skilled ST to maximize speech/language skills and improve pt's QOL, as she remains tearful at times during therapy sessions.        Problem List Patient Active Problem List   Diagnosis Date Noted  . Monilial vaginitis 10/23/2014  . Deep vein thrombosis 08/12/2014  . History of inferior vena caval filter placement 07/14/2014  . Malignant glioma of brain 06/28/2014  . Diabetes mellitus, type 2 06/27/2014  . 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 Tremor of both hands 04/05/2014  . Vertigo 04/05/2014  . BPPV (benign paroxysmal positional vertigo) 03/14/2014  . Seizures 12/16/2013  . Epigastric pain 11/01/2013  . Fever blister 10/16/2013  . Unspecified hypothyroidism 12/13/2012  . Other and unspecified hyperlipidemia 12/13/2012  . Anxiety state, unspecified 12/13/2012  . Edema 12/13/2012  . Anxiety state 12/13/2012  . Adult hypothyroidism 12/13/2012  . Type II or unspecified type diabetes mellitus without mention of complication, not stated as uncontrolled   . Urinary incontinence   . DEPRESSION 09/24/2007  . Essential hypertension 09/24/2007  . GERD 09/24/2007  . HIATAL HERNIA 09/24/2007  . 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 Headache(784.0)  09/24/2007    Kelly Fletcher , [place], [place]  10/31/2014, 2:01 PM  [facility] [address] [place], [place], [address] Phone: [phone]   Fax:  [phone]

## 2014-11-01 ENCOUNTER — Encounter: Payer: Self-pay | Admitting: Occupational Therapy

## 2014-11-01 ENCOUNTER — Ambulatory Visit: Payer: Medicare Other | Admitting: Physical Therapy

## 2014-11-01 ENCOUNTER — Ambulatory Visit: Payer: Medicare Other

## 2014-11-01 ENCOUNTER — Encounter: Payer: Self-pay | Admitting: Physical Therapy

## 2014-11-01 ENCOUNTER — Ambulatory Visit: Payer: Medicare Other | Admitting: Occupational Therapy

## 2014-11-01 DIAGNOSIS — R269 Unspecified abnormalities of gait and mobility: Secondary | ICD-10-CM

## 2014-11-01 DIAGNOSIS — R4701 Aphasia: Secondary | ICD-10-CM

## 2014-11-01 DIAGNOSIS — R2689 Other abnormalities of gait and mobility: Secondary | ICD-10-CM

## 2014-11-01 DIAGNOSIS — R278 Other lack of coordination: Secondary | ICD-10-CM

## 2014-11-01 DIAGNOSIS — R29818 Other symptoms and signs involving the nervous system: Secondary | ICD-10-CM | POA: Diagnosis not present

## 2014-11-01 DIAGNOSIS — R6889 Other general symptoms and signs: Secondary | ICD-10-CM

## 2014-11-01 DIAGNOSIS — R29898 Other symptoms and signs involving the musculoskeletal system: Secondary | ICD-10-CM

## 2014-11-01 DIAGNOSIS — M25511 Pain in right shoulder: Secondary | ICD-10-CM

## 2014-11-01 NOTE — Therapy (Signed)
[facility] [address] [place], [place], [address] Phone: [phone]   Fax:  [phone]  Occupational Therapy Treatment  Patient Details  Name: Kelly Fletcher MRN: [id number] Date of Birth: 1950-09-02 Referring Provider:  Estill Dooms, MD  Encounter Date: 11/01/2014      OT End of Session - 11/01/14 1230    Visit Number 4   Number of Visits 17   Date for OT Re-Evaluation 12/08/14   Authorization Type MCR - G code needed   Authorization Time Period 60 Days, (week 1/8)   OT Start Time 0931   OT Stop Time 1015   OT Time Calculation (min) 44 min   Activity Tolerance Patient tolerated treatment well      Past Medical History  Diagnosis Date  . Uterine prolapse   . Cystocele   . GERD (gastroesophageal reflux disease)   . Elevated cholesterol   . Rectocele   . Hypertension   . Arthritis   . Type II or unspecified type diabetes mellitus without mention of complication, uncontrolled     Type 2  . Urinary incontinence     Urgency  . Anxiety state, unspecified   . Palpitations   . Allergic rhinitis, cause unspecified   . Other and unspecified hyperlipidemia   . Unspecified hypothyroidism   . Edema   . Insomnia, unspecified   . Depressive disorder, not elsewhere classified   . Unspecified urinary incontinence   . Peptic ulcer, unspecified site, unspecified as acute or chronic, without mention of hemorrhage, perforation, or obstruction   . Disturbance of skin sensation   . Calculus of kidney   . Facial spasm 04/05/2014  . Abnormal brain MRI 04/13/2014  . 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 Monilial vaginitis 10/23/2014    Past Surgical History  Procedure Laterality Date  . Tonsillectomy and adenoidectomy  1963  . Dilation and curettage of uterus      3-05  . Hysteroscopy      3-05  . Endometrial biopsy      Dr.Gottsegen   .  Tongue biopsy      There were no vitals filed for this visit.  Visit Diagnosis:  Pain in joint, shoulder region, right  Weakness of right arm      Subjective Assessment - 11/01/14 0945    Subjective  I did my exercise and it hurt my shoulder   Pertinent History glioblastoma with brain surgery to remove tumor 06/28/14, facial seizures   Patient Stated Goals To get my Rt shoulder and hand better b/c I'm Rt handed   Currently in Pain? Yes   Pain Score 9    Pain Location Shoulder   Pain Orientation Right   Pain Descriptors / Indicators Aching;Sore   Pain Type Chronic pain   Pain Onset More than a month ago   Pain Frequency Intermittent   Aggravating Factors  trying to raise arm,  moving it.   Pain Relieving Factors ice, heat, coming to OT                      OT Treatments/Exercises (OP) - 11/01/14 0001    Neurological Re-education Exercises   Other Exercises 1 Neuro re ed to address shoulder flexion/extension and chest presses in supine. Pt was doing shoulder flexion cane exercises in standing and arrived today in siginficant pain. Had pt practice  in supine and STRONGLY reitirated that she be in supine with any shoulder flexion activities.  Also showed pt how to use other arm to support weight of RUE if she is standing or sitting and doing low reach with RUE. Pt able to return demonstrate and verbalize understanding.   Moist Heat Therapy   Number Minutes Moist Heat 8 Minutes  simultaneously with manual therapy   Moist Heat Location Shoulder   Manual Therapy   Manual Therapy Joint mobilization;Soft tissue mobilization   Manual therapy comments Pt arrived today with 9/10 shoulder pain and tearful. In discussion pt stated she was doing shoulder flexion exercises in standing vs supine. See above note.  Pt also with significant guarding of RUE.  Pt with very poor ability to localize pain.  After heat, joint mob and soft tissue mob able to range pt passively to 145* flexion  and pt able to actively range to 125* shoulder flexion in supine with closed chain activity.  Pt reporting muscle pain in forearm and in bicep still however rated pain 6/10 at end of session.                  OT Short Term Goals - 11/01/14 1227    OT SHORT TERM GOAL #1   Title Independent with HEP for Rt hand coordination, RUE ROM and Rt hand strength (DUE 11/08/14)   Time 4   Period Weeks   Status On-going   OT SHORT TERM GOAL #2   Title Pt to verbalize understanding with pain management strategies RUE   Time 4   Period Weeks   Status On-going   OT SHORT TERM GOAL #3   Title Pt to write name at 75% or greater legibility w/ A/E for pen prn Rt dominant hand   Baseline approx. 25% first name only with built up pen   Time 4   Period Weeks   Status On-going   OT SHORT TERM GOAL #4   Title Improve RUE functional use as evidenced by performing 25 blocks or greater on Box & Blocks test   Baseline eval = 17   Time 4   Period Weeks   Status On-going   OT SHORT TERM GOAL #5   Title Pt to improve grip strength Rt hand to 20 lbs or greater to decrease drops   Baseline eval = 10 lbs (Lt = 55 lbs)   Time 4   Period Weeks   Status On-going   OT SHORT TERM GOAL #6   Title Pt to tie shoes and hook bra mod I level incorporating Rt dominant hand for assist   Baseline dependent   Time 4   Period Weeks   Status On-going   OT SHORT TERM GOAL #7   Title Pt to use Rt dominant hand to feed Stakes and groom Murdy 50% of the time    Baseline none with Rt hand, using Lt non dominant hand   Time 4   Period Weeks   Status On-going           OT Long Term Goals - 11/01/14 1228    OT LONG TERM GOAL #1   Title Independent with updated HEP for RUE strength (DUE 12/08/14)   Time 8   Period Weeks   Status On-going   OT LONG TERM GOAL #2   Title Pt to perform high level reaching RUE to retrieve/replace 3 lb. object from high shelf 10 times w/o rest   Baseline only 75% shoulder  flexion   Time  8   Period Weeks   Status On-going   OT LONG TERM GOAL #3   Title Improve RUE functional use as evidenced by performing 40 blocks on Box & Blocks test    Baseline eval = 17   Time 8   Period Weeks   Status On-going   OT LONG TERM GOAL #4   Title Improve coordination as evidenced by performing 9 hole peg test in under 90 sec.    Baseline eval: only able to place 1 peg in 60 sec.    Time 8   Period Weeks   Status On-going   OT LONG TERM GOAL #5   Title Pt to improve grip strength Rt dominant hand to 30 lbs or greater to open jars/containers   Baseline eval = 10 lbs   Time 8   Period Weeks   Status On-going   OT LONG TERM GOAL #6   Title Pt to return to using Rt hand as dominant hand for all BADLS 10% of the time or greater   Baseline only using LUE   Time 8   Period Weeks   Status On-going   OT LONG TERM GOAL #7   Title Pt to write short paragraph with Rt dominant hand using A/E prn with 90% legibility    Baseline 25% legibility for first name only   Time 8   Period Weeks   Status On-going   OT LONG TERM GOAL #8   Title Pt to return to light cooking/cleaning tasks mod I level with A/E and/or DME prn   Baseline dependent   Time 8   Period Weeks   Status On-going               Plan - 11/01/14 1228    Clinical Impression Statement Pt with significant pain today in RUE; reviewed HEP and after session pt with less pain. Pt very tearful today   Pt will benefit from skilled therapeutic intervention in order to improve on the following deficits (Retired) Decreased coordination;Decreased range of motion;Difficulty walking;Decreased safety awareness;Decreased endurance;Increased edema;Impaired sensation;Decreased activity tolerance;Decreased knowledge of precautions;Impaired tone;Decreased balance;Decreased knowledge of use of DME;Impaired UE functional use;Pain;Decreased cognition;Decreased mobility;Decreased strength   Rehab Potential Good   Clinical Impairments Affecting  Rehab Potential Pt will benefit from skilled OT to address deficits (see above)   OT Frequency 2x / week   OT Duration 8 weeks   OT Treatment/Interventions Stucker-care/ADL training;Fluidtherapy;Moist Heat;DME and/or AE instruction;Splinting;Patient/family education;Therapeutic exercises;Balance training;Therapeutic activities;Neuromuscular education;Functional Mobility Training;Passive range of motion;Cognitive remediation/compensation;Electrical Stimulation;Parrafin;Energy conservation;Manual Therapy   Plan address shoulder pain, low reach, neuro re ed. CHECK STG"s   Consulted and Agree with Plan of Care Patient        Problem List Patient Active Problem List   Diagnosis Date Noted  . Monilial vaginitis 10/23/2014  . Deep vein thrombosis 08/12/2014  . History of inferior vena caval filter placement 07/14/2014  . Malignant glioma of brain 06/28/2014  . Diabetes mellitus, type 2 06/27/2014  . 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 Tremor of both hands 04/05/2014  . Vertigo 04/05/2014  . BPPV (benign paroxysmal positional vertigo) 03/14/2014  . Seizures 12/16/2013  . Epigastric pain 11/01/2013  . Fever blister 10/16/2013  . Unspecified hypothyroidism 12/13/2012  . Other and unspecified hyperlipidemia 12/13/2012  . Anxiety state, unspecified 12/13/2012  . Edema 12/13/2012  . Anxiety state 12/13/2012  . Adult hypothyroidism 12/13/2012  . Type II or unspecified type diabetes mellitus without mention  of complication, not stated as uncontrolled   . Urinary incontinence   . DEPRESSION 09/24/2007  . Essential hypertension 09/24/2007  . GERD 09/24/2007  . HIATAL HERNIA 09/24/2007  . 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 Headache(784.0) 09/24/2007    Quay Burow, OTR/L 11/01/2014, 12:31 PM  [facility] [address] [place], [place], [address] Phone: [phone]   Fax:  [phone]

## 2014-11-01 NOTE — Patient Instructions (Signed)
Continue to practice with people in conversation - the first three things you say, after the greeting

## 2014-11-01 NOTE — Therapy (Signed)
[facility] [address] [place], [place], [address] Phone: [phone]   Fax:  [phone]  Speech Language Pathology Treatment  Patient Details  Name: Kelly Fletcher MRN: [id number] Date of Birth: 25-Dec-1950 Referring Provider:  Estill Dooms, MD  Encounter Date: 11/01/2014      End of Session - 11/01/14 1059    Visit Number 6   Number of Visits 16   Date for SLP Re-Evaluation 12/08/14   SLP Start Time 37   SLP Stop Time  1100   SLP Time Calculation (min) 41 min   Activity Tolerance Patient tolerated treatment well      Past Medical History  Diagnosis Date  . Uterine prolapse   . Cystocele   . GERD (gastroesophageal reflux disease)   . Elevated cholesterol   . Rectocele   . Hypertension   . Arthritis   . Type II or unspecified type diabetes mellitus without mention of complication, uncontrolled     Type 2  . Urinary incontinence     Urgency  . Anxiety state, unspecified   . Palpitations   . Allergic rhinitis, cause unspecified   . Other and unspecified hyperlipidemia   . Unspecified hypothyroidism   . Edema   . Insomnia, unspecified   . Depressive disorder, not elsewhere classified   . Unspecified urinary incontinence   . Peptic ulcer, unspecified site, unspecified as acute or chronic, without mention of hemorrhage, perforation, or obstruction   . Disturbance of skin sensation   . Calculus of kidney   . Facial spasm 04/05/2014  . Abnormal brain MRI 04/13/2014  . 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 Monilial vaginitis 10/23/2014    Past Surgical History  Procedure Laterality Date  . Tonsillectomy and adenoidectomy  1963  . Dilation and curettage of uterus      3-05  . Hysteroscopy      3-05  . Endometrial biopsy      Dr.Gottsegen   . Tongue biopsy      There were no vitals filed for this visit.  Visit Diagnosis:  Aphasia      Subjective Assessment - 11/01/14 1023    Subjective Pt reports less pain in shoulder due to OT prior to ST today.               ADULT SLP TREATMENT - 11/01/14 1024    General Information   Behavior/Cognition Alert;Cooperative;Pleasant mood   Treatment Provided   Treatment provided Cognitive-Linquistic   Pain Assessment   Pain Assessment 0-10   Pain Score 7    Pain Location shoulder   Pain Descriptors / Indicators Aching   Pain Intervention(s) Monitored during session   Cognitive-Linquistic Treatment   Treatment focused on Apraxia   Skilled Treatment SLP facilitated formal speech tasks today with SLP using skilled observation to assess progress, pt was 90% succssful in sentence generation tasks using compensations, and 85% successful with speech fluidity. In similarity/difference and description task (multisentence), pt demonstrated approx 80% increasing to 85% use of compensations and 70% increasing to approx 80% fluidity with her speech during these tasks. In conversation (simple), pt with approx 60% use of compensations and approx 75% fluidity.     Assessment / Recommendations / Plan   Plan Continue with current plan of care   Progression Toward Goals   Progression toward goals Progressing toward goals  SLP Education - 11/01/14 1058    Education provided Yes   Education Details rate of speech (slowing/exaggerating vs. quicker rate with apraxic-like behavior)   Person(s) Educated Patient   Methods Explanation   Comprehension Verbalized understanding          SLP Short Term Goals - 11/01/14 1059    SLP SHORT TERM GOAL #1   Title pt will complete HEP for motor speech planning with slow rate 95% success with rare verbal cues   Time 2   Period Weeks   Status On-going   SLP SHORT TERM GOAL #2   Title pt will produce sentences using speech compensations with 85% fluency   Status Achieved   SLP SHORT TERM GOAL #3   Title pt will tell SLP  three compensations for improving motor planning for speech   Status Achieved   SLP SHORT TERM GOAL #4   Title pt to participate in 5 minutes simple conversation with 85% fluency with modified independence   Time 2   Period Weeks   Status On-going          SLP Long Term Goals - 11/01/14 1101    SLP LONG TERM GOAL #1   Title pt will participate in 10 mintues mod complex conversation 90% fluency, with modified independence (compensations)   Time 6   Period Weeks   Status On-going   SLP LONG TERM GOAL #2   Title pt will complete HEP with 90% fluency and modifed independence (compensations)   Time 6   Period Weeks   Status On-going   SLP LONG TERM GOAL #3   Title pt will participate in 7 minutes complex conversation with 85% fluency with modified independence   Time 6   Period Weeks   Status On-going          Plan - 11/01/14 1059    Clinical Impression Statement 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 Pt cont to require skilled ST to maximize speech/language skills and improve pt's QOL, as she remains tearful at times during therapy sessions.   Speech Therapy Frequency 2x / week   Duration --  6 weeks   Treatment/Interventions Cueing hierarchy;Functional tasks;Patient/family education;Compensatory strategies;SLP instruction and feedback   Potential to Achieve Goals Fair   Potential Considerations Co-morbidities;Severity of impairments        Problem List Patient Active Problem List   Diagnosis Date Noted  . Monilial vaginitis 10/23/2014  . Deep vein thrombosis 08/12/2014  . History of inferior vena caval filter placement 07/14/2014  . Malignant glioma of brain 06/28/2014  . Diabetes mellitus, type 2 06/27/2014  . 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 Tremor of both hands 04/05/2014  . Vertigo 04/05/2014  . BPPV (benign paroxysmal positional vertigo) 03/14/2014  . Seizures 12/16/2013  . Epigastric pain 11/01/2013  . Fever  blister 10/16/2013  . Unspecified hypothyroidism 12/13/2012  . Other and unspecified hyperlipidemia 12/13/2012  . Anxiety state, unspecified 12/13/2012  . Edema 12/13/2012  . Anxiety state 12/13/2012  . Adult hypothyroidism 12/13/2012  . Type II or unspecified type diabetes mellitus without mention of complication, not stated as uncontrolled   . Urinary incontinence   . DEPRESSION 09/24/2007  . Essential hypertension 09/24/2007  . GERD 09/24/2007  . HIATAL HERNIA 09/24/2007  . 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 Headache(784.0) 09/24/2007    SCHINKE,CARL , MS, [place]  11/01/2014, 11:04 AM  [facility] [address] [place], [place], [address] Phone: [phone]  Fax:  [phone]

## 2014-11-01 NOTE — Therapy (Signed)
[facility] [address] [place], [place], [address] Phone: [phone]   Fax:  [phone]  Physical Therapy Treatment  Patient Details  Name: Kelly Fletcher MRN: [id number] Date of Birth: 11-12-1950 Referring Provider:  Estill Dooms, MD  Encounter Date: 10/31/2014      PT End of Session - 10/31/14 1630    Visit Number 2   Number of Visits 17   Date for PT Re-Evaluation 12/10/14   Authorization Type G-code & KX modifier   PT Start Time 1445   PT Stop Time 1530   PT Time Calculation (min) 45 min   Equipment Utilized During Treatment Gait belt   Activity Tolerance Patient tolerated treatment well   Behavior During Therapy [facility] for tasks assessed/performed      Past Medical History  Diagnosis Date  . Uterine prolapse   . Cystocele   . GERD (gastroesophageal reflux disease)   . Elevated cholesterol   . Rectocele   . Hypertension   . Arthritis   . Type II or unspecified type diabetes mellitus without mention of complication, uncontrolled     Type 2  . Urinary incontinence     Urgency  . Anxiety state, unspecified   . Palpitations   . Allergic rhinitis, cause unspecified   . Other and unspecified hyperlipidemia   . Unspecified hypothyroidism   . Edema   . Insomnia, unspecified   . Depressive disorder, not elsewhere classified   . Unspecified urinary incontinence   . Peptic ulcer, unspecified site, unspecified as acute or chronic, without mention of hemorrhage, perforation, or obstruction   . Disturbance of skin sensation   . Calculus of kidney   . Facial spasm 04/05/2014  . Abnormal brain MRI 04/13/2014  . 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 Monilial vaginitis 10/23/2014    Past Surgical History  Procedure Laterality Date  . Tonsillectomy and adenoidectomy  1963  . Dilation and curettage of uterus      3-05  . Hysteroscopy     3-05  . Endometrial biopsy      Dr.Gottsegen   . Tongue biopsy      There were no vitals filed for this visit.  Visit Diagnosis:  Balance problems  Decreased functional activity tolerance  Coordination abnormal    10/31/14 1452  Symptoms/Limitations  Subjective No new complaints.  No falls. Still doing the HEP that rehab sent home with her (heel/toe raises, sit/stands, mini squats, alternating toe taps to cones, square stepping). Still with significant right UE pain (OT addressing)  Pain Assessment  Currently in Pain? Yes  Pain Score 9  Pain Location Arm  Pain Orientation Right  Pain Descriptors / Indicators Tightness;Aching  Pain Type Neuropathic pain  Pain Onset More than a month ago  Pain Frequency Intermittent  Aggravating Factors  movement  Pain Relieving Factors ice, heat, tylenol, massage that OT does in sessions    Treatment today focused on establishing HEP for home. Refer to pt instructions section for full details.          PT Education - 11/01/14 1113    Education provided Yes   Education Details HEP: corner balance and dynamic balance activities at [employer]) Educated Patient;Spouse   Methods Explanation;Handout;Verbal cues   Comprehension Verbalized understanding;Returned demonstration          PT Short Term Goals - 10/10/14 1345  PT SHORT TERM GOAL #1   Title patient correcty demonstrates initial HEP with husband cueing. (Target Date: 11/09/2014)   Time 4   Period Weeks   Status New   PT SHORT TERM GOAL #2   Title Timed Up & Go <13.5 sec without device (Target Date: 11/09/2014)   Time 4   Period Weeks   Status New   PT SHORT TERM GOAL #3   Title Berg Balance >46/56 (Target Date: 11/09/2014)   Time 4   Period Weeks   Status New   PT SHORT TERM GOAL #4   Title ambulates 500' without device including grass surfaces, ramps & curbs with supervision. (Target Date: 11/09/2014)   Time 4   Period Weeks   Status New           PT  Long Term Goals - 10/10/14 1354    PT LONG TERM GOAL #1   Title demonstrates / verbalizes understanding of ongoing HEP / fitness plan. (Target Date: 12/07/2014)   Time 8   Period Weeks   Status New   PT LONG TERM GOAL #2   Title Cognitive Timed Up & Go <15 sec safely  (Target Date: 12/07/2014)   Time 8   Period Weeks   Status New   PT LONG TERM GOAL #3   Title Berg Balance >/=52/56  (Target Date: 12/07/2014)   Time 8   Period Weeks   Status New   PT LONG TERM GOAL #4   Title Functional Gait Assessment >24/ 30  (Target Date: 12/07/2014)   Time 8   Period Weeks   Status New   PT LONG TERM GOAL #5   Title ambulates >1000' without device including grass, ramps, curbs, stairs indpendently.  (Target Date: 12/07/2014)   Time 8   Period Weeks   Status New           Plan - 10/31/14 1630    Clinical Impression Statement Pt/spouse educated on exercise program for home to address balance and strengthening. No complaints with performance of them in session today. Progressing toward goals.   Pt will benefit from skilled therapeutic intervention in order to improve on the following deficits Abnormal gait;Decreased activity tolerance;Decreased balance;Decreased coordination;Decreased endurance;Decreased mobility   Rehab Potential Good   PT Frequency 2x / week   PT Duration 8 weeks   PT Treatment/Interventions ADLs/[facility];DME Instruction;Gait training;Stair training;Functional mobility training;Therapeutic activities;Therapeutic exercise;Balance training;Neuromuscular re-education;Patient/family education   PT Next Visit Plan continue to address balance, coordination, multi-tasking gait, activity tolerance   Consulted and Agree with Plan of Care Patient;Family member/caregiver   Family Member Consulted husband        Problem List Patient Active Problem List   Diagnosis Date Noted  . Monilial vaginitis 10/23/2014  . Deep vein thrombosis 08/12/2014  . History of inferior  vena caval filter placement 07/14/2014  . Malignant glioma of brain 06/28/2014  . Diabetes mellitus, type 2 06/27/2014  . 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 Tremor of both hands 04/05/2014  . Vertigo 04/05/2014  . BPPV (benign paroxysmal positional vertigo) 03/14/2014  . Seizures 12/16/2013  . Epigastric pain 11/01/2013  . Fever blister 10/16/2013  . Unspecified hypothyroidism 12/13/2012  . Other and unspecified hyperlipidemia 12/13/2012  . Anxiety state, unspecified 12/13/2012  . Edema 12/13/2012  . Anxiety state 12/13/2012  . Adult hypothyroidism 12/13/2012  . Type II or unspecified type diabetes mellitus without mention of complication, not stated as uncontrolled   . Urinary incontinence   .  DEPRESSION 09/24/2007  . Essential hypertension 09/24/2007  . GERD 09/24/2007  . HIATAL HERNIA 09/24/2007  . 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 Headache(784.0) 09/24/2007    Willow Ora 11/01/2014, 11:13 AM  Willow Ora, PTA, [facility] [address], [place], [place] [address] [phone] 11/01/2014, 11:13 AM

## 2014-11-01 NOTE — Therapy (Signed)
[facility] [address] [place], [place], [address] Phone: [phone]   Fax:  [phone]  Physical Therapy Treatment  Patient Details  Name: Kelly Fletcher MRN: [id number] Date of Birth: 02/09/1951 Referring Provider:  Estill Dooms, MD  Encounter Date: 11/01/2014      PT End of Session - 11/04/14 1353    Visit Number 3   Number of Visits 17   Date for PT Re-Evaluation 12/10/14   Authorization Type G-code & KX modifier   PT Start Time 1100   PT Stop Time 1145   PT Time Calculation (min) 45 min   Equipment Utilized During Treatment Gait belt   Activity Tolerance Patient tolerated treatment well   Behavior During Therapy [facility] for tasks assessed/performed      Past Medical History  Diagnosis Date  . Uterine prolapse   . Cystocele   . GERD (gastroesophageal reflux disease)   . Elevated cholesterol   . Rectocele   . Hypertension   . Arthritis   . Type II or unspecified type diabetes mellitus without mention of complication, uncontrolled     Type 2  . Urinary incontinence     Urgency  . Anxiety state, unspecified   . Palpitations   . Allergic rhinitis, cause unspecified   . Other and unspecified hyperlipidemia   . Unspecified hypothyroidism   . Edema   . Insomnia, unspecified   . Depressive disorder, not elsewhere classified   . Unspecified urinary incontinence   . Peptic ulcer, unspecified site, unspecified as acute or chronic, without mention of hemorrhage, perforation, or obstruction   . Disturbance of skin sensation   . Calculus of kidney   . Facial spasm 04/05/2014  . Abnormal brain MRI 04/13/2014  . 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 Monilial vaginitis 10/23/2014    Past Surgical History  Procedure Laterality Date  . Tonsillectomy and adenoidectomy  1963  . Dilation and curettage of uterus      3-05  . Hysteroscopy     3-05  . Endometrial biopsy      Dr.Gottsegen   . Tongue biopsy      There were no vitals filed for this visit.  Visit Diagnosis:  Abnormality of gait  Balance problems  Decreased functional activity tolerance  Coordination abnormal    Treatment: Scifit legs only level 3.5 x 8 minutes with goal rpm >/= 65 for strengthening and activity tolerance.   Both red mats side by side: 3 laps each with min guard assist to min assist for balance High knee marches fwd/bwd tandem gait fwd/bwd Toe walking fwd/bwd Heel walking fwd/bwd Stepping over hurdles x 6 laps forward        PT Short Term Goals - 10/10/14 1345    PT SHORT TERM GOAL #1   Title patient correcty demonstrates initial HEP with husband cueing. (Target Date: 11/09/2014)   Time 4   Period Weeks   Status New   PT SHORT TERM GOAL #2   Title Timed Up & Go <13.5 sec without device (Target Date: 11/09/2014)   Time 4   Period Weeks   Status New   PT SHORT TERM GOAL #3   Title Berg Balance >46/56 (Target Date: 11/09/2014)   Time 4   Period Weeks   Status New   PT SHORT TERM GOAL #4   Title ambulates 500' without device including  grass surfaces, ramps & curbs with supervision. (Target Date: 11/09/2014)   Time 4   Period Weeks   Status New           PT Long Term Goals - 10/10/14 1354    PT LONG TERM GOAL #1   Title demonstrates / verbalizes understanding of ongoing HEP / fitness plan. (Target Date: 12/07/2014)   Time 8   Period Weeks   Status New   PT LONG TERM GOAL #2   Title Cognitive Timed Up & Go <15 sec safely  (Target Date: 12/07/2014)   Time 8   Period Weeks   Status New   PT LONG TERM GOAL #3   Title Berg Balance >/=52/56  (Target Date: 12/07/2014)   Time 8   Period Weeks   Status New   PT LONG TERM GOAL #4   Title Functional Gait Assessment >24/ 30  (Target Date: 12/07/2014)   Time 8   Period Weeks   Status New   PT LONG TERM GOAL #5   Title ambulates >1000' without device including grass, ramps,  curbs, stairs indpendently.  (Target Date: 12/07/2014)   Time 8   Period Weeks   Status New           Plan - 11/04/14 1353    Clinical Impression Statement Pt continues to be challenged by balance and strengthening activities. Making steady progress toward goals.   Pt will benefit from skilled therapeutic intervention in order to improve on the following deficits Abnormal gait;Decreased activity tolerance;Decreased balance;Decreased coordination;Decreased endurance;Decreased mobility   Rehab Potential Good   PT Frequency 2x / week   PT Duration 8 weeks   PT Treatment/Interventions ADLs/[facility];DME Instruction;Gait training;Stair training;Functional mobility training;Therapeutic activities;Therapeutic exercise;Balance training;Neuromuscular re-education;Patient/family education   PT Next Visit Plan continue to address balance, coordination, multi-tasking gait, activity tolerance   Consulted and Agree with Plan of Care Patient;Family member/caregiver   Family Member Consulted husband        Problem List Patient Active Problem List   Diagnosis Date Noted  . Monilial vaginitis 10/23/2014  . Deep vein thrombosis 08/12/2014  . History of inferior vena caval filter placement 07/14/2014  . Malignant glioma of brain 06/28/2014  . Diabetes mellitus, type 2 06/27/2014  . 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 Tremor of both hands 04/05/2014  . Vertigo 04/05/2014  . BPPV (benign paroxysmal positional vertigo) 03/14/2014  . Seizures 12/16/2013  . Epigastric pain 11/01/2013  . Fever blister 10/16/2013  . Unspecified hypothyroidism 12/13/2012  . Other and unspecified hyperlipidemia 12/13/2012  . Anxiety state, unspecified 12/13/2012  . Edema 12/13/2012  . Anxiety state 12/13/2012  . Adult hypothyroidism 12/13/2012  . Type II or unspecified type diabetes mellitus without mention of complication, not stated as uncontrolled   . Urinary incontinence   . DEPRESSION  09/24/2007  . Essential hypertension 09/24/2007  . GERD 09/24/2007  . HIATAL HERNIA 09/24/2007  . 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 Headache(784.0) 09/24/2007    Willow Ora 11/04/2014, 1:54 PM  Willow Ora, PTA, [facility] [address] [place], [place] [address] [phone] 11/04/2014, 1:55 PM

## 2014-11-06 ENCOUNTER — Ambulatory Visit: Payer: Medicare Other | Admitting: Occupational Therapy

## 2014-11-06 ENCOUNTER — Ambulatory Visit: Payer: Medicare Other | Admitting: Speech Pathology

## 2014-11-06 ENCOUNTER — Ambulatory Visit: Payer: Medicare Other | Admitting: Physical Therapy

## 2014-11-06 ENCOUNTER — Encounter: Payer: Self-pay | Admitting: Occupational Therapy

## 2014-11-06 DIAGNOSIS — R4701 Aphasia: Secondary | ICD-10-CM

## 2014-11-06 DIAGNOSIS — R2689 Other abnormalities of gait and mobility: Secondary | ICD-10-CM

## 2014-11-06 DIAGNOSIS — R269 Unspecified abnormalities of gait and mobility: Secondary | ICD-10-CM

## 2014-11-06 DIAGNOSIS — M25511 Pain in right shoulder: Secondary | ICD-10-CM

## 2014-11-06 DIAGNOSIS — R278 Other lack of coordination: Secondary | ICD-10-CM

## 2014-11-06 DIAGNOSIS — R29818 Other symptoms and signs involving the nervous system: Secondary | ICD-10-CM | POA: Diagnosis not present

## 2014-11-06 DIAGNOSIS — R29898 Other symptoms and signs involving the musculoskeletal system: Secondary | ICD-10-CM

## 2014-11-06 DIAGNOSIS — R279 Unspecified lack of coordination: Secondary | ICD-10-CM

## 2014-11-06 DIAGNOSIS — R6889 Other general symptoms and signs: Secondary | ICD-10-CM

## 2014-11-06 NOTE — Therapy (Signed)
[facility] [address] [place], [place], [address] Phone: [phone]   Fax:  [phone]  Physical Therapy Treatment  Patient Details  Name: Kelly Fletcher MRN: [id number] Date of Birth: [birth year] Referring Provider:  Estill Dooms, MD  Encounter Date: 11/06/2014      PT End of Session - 11/06/14 1335    Visit Number 4   Number of Visits 17   Date for PT Re-Evaluation 12/10/14   Authorization Type G-code & KX modifier   PT Start Time 1230   PT Stop Time 1315   PT Time Calculation (min) 45 min   Equipment Utilized During Treatment Gait belt   Activity Tolerance Patient tolerated treatment well   Behavior During Therapy [facility] for tasks assessed/performed      Past Medical History  Diagnosis Date  . Uterine prolapse   . Cystocele   . GERD (gastroesophageal reflux disease)   . Elevated cholesterol   . Rectocele   . Hypertension   . Arthritis   . Type II or unspecified type diabetes mellitus without mention of complication, uncontrolled     Type 2  . Urinary incontinence     Urgency  . Anxiety state, unspecified   . Palpitations   . Allergic rhinitis, cause unspecified   . Other and unspecified hyperlipidemia   . Unspecified hypothyroidism   . Edema   . Insomnia, unspecified   . Depressive disorder, not elsewhere classified   . Unspecified urinary incontinence   . Peptic ulcer, unspecified site, unspecified as acute or chronic, without mention of hemorrhage, perforation, or obstruction   . Disturbance of skin sensation   . Calculus of kidney   . Facial spasm 04/05/2014  . Abnormal brain MRI 04/13/2014  . 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 Monilial vaginitis 10/23/2014    Past Surgical History  Procedure Laterality Date  . Tonsillectomy and adenoidectomy  1963  . Dilation and curettage of uterus      3-05  . Hysteroscopy       3-05  . Endometrial biopsy      Dr.Gottsegen   . Tongue biopsy      There were no vitals filed for this visit.  Visit Diagnosis:  Abnormality of gait  Balance problems  Decreased functional activity tolerance  Coordination abnormal  Lack of coordination      Subjective Assessment - 11/06/14 1243    Subjective pt reports no falls since last treatment session and that she had been compliant with HEP. pt reports same pain in her R shoulder and hand. pt reports shoulder pain is worse than anything.    Patient is accompained by: Family member   Patient Stated Goals I want to walk better especially stairs, endurance, multi-task like talk & walk       Treatment   Therapeutic Exercise: -NuStep LEs only 8 minutes with level 5 workload for strengthening and activity tolerance.  -Seated hip abduction strengthening x 20 reps with red theraband.    Neuromuscular Reeducation:  -[place] with gait for coordination and multitasking. Pt instructed to recite animal names corresponding to letters of the alphabet. Completed full alphabet (approximately 4 laps around gym area). CGA/SBA for safety. -Coordination/balance training sitting on physioball: pt practiced marching in place maintaining balance, marching LEs forwards and backwards with upright posture, as well and coordinating LE hip flexion with UE arm flexion (  R UE flexion as tolerated) while marching. SBA.  -Figure 8 walking around cones for dynamic balance x 4 laps. CGA for safety.  -Stepping over 4 hurdles x 4 laps  CGA for safety -Figure 8 walking and stepping over hurdles while pt instructed to recite cognitive tasks (such as reciting the months of the year backwards or types of cereals). CGA for safety. -Braided gait x 80 feet, alternating direction of movement (braiding to left 40 feet and braiding to right 40 feet). Emphasis on coordination. CGA for safety.  -Stair training x 5 reps. Pt practiced reciprocal stepping pattern using  one railing for UE support. SBA.          PT Short Term Goals - 10/10/14 1345    PT SHORT TERM GOAL #1   Title patient correcty demonstrates initial HEP with husband cueing. (Target Date: 11/09/2014)   Time 4   Period Weeks   Status New   PT SHORT TERM GOAL #2   Title Timed Up & Go <13.5 sec without device (Target Date: 11/09/2014)   Time 4   Period Weeks   Status New   PT SHORT TERM GOAL #3   Title Berg Balance >46/56 (Target Date: 11/09/2014)   Time 4   Period Weeks   Status New   PT SHORT TERM GOAL #4   Title ambulates 500' without device including grass surfaces, ramps & curbs with supervision. (Target Date: 11/09/2014)   Time 4   Period Weeks   Status New           PT Long Term Goals - 10/10/14 1354    PT LONG TERM GOAL #1   Title demonstrates / verbalizes understanding of ongoing HEP / fitness plan. (Target Date: 12/07/2014)   Time 8   Period Weeks   Status New   PT LONG TERM GOAL #2   Title Cognitive Timed Up & Go <15 sec safely  (Target Date: 12/07/2014)   Time 8   Period Weeks   Status New   PT LONG TERM GOAL #3   Title Berg Balance >/=52/56  (Target Date: 12/07/2014)   Time 8   Period Weeks   Status New   PT LONG TERM GOAL #4   Title Functional Gait Assessment >24/ 30  (Target Date: 12/07/2014)   Time 8   Period Weeks   Status New   PT LONG TERM GOAL #5   Title ambulates >1000' without device including grass, ramps, curbs, stairs indpendently.  (Target Date: 12/07/2014)   Time 8   Period Weeks   Status New           Plan - 11/06/14 1335    Clinical Impression Statement pt is making progress towards goals. pt continues to be challenged with coordination and balance activities, but demonstrated improvement with repetition.    Pt will benefit from skilled therapeutic intervention in order to improve on the following deficits Abnormal gait;Decreased activity tolerance;Decreased balance;Decreased coordination;Decreased endurance;Decreased mobility   Rehab  Potential Good   PT Frequency 2x / week   PT Duration 8 weeks   PT Treatment/Interventions ADLs/[facility];DME Instruction;Gait training;Stair training;Functional mobility training;Therapeutic activities;Therapeutic exercise;Balance training;Neuromuscular re-education;Patient/family education   PT Next Visit Plan Assess STG's;continue to address balance, coordination, multi-tasking gait, activity tolerance   Consulted and Agree with Plan of Care Patient;Family member/caregiver        Problem List Patient Active Problem List   Diagnosis Date Noted  . Monilial vaginitis 10/23/2014  . Deep vein thrombosis  08/12/2014  . History of inferior vena caval filter placement 07/14/2014  . Malignant glioma of brain 06/28/2014  . Diabetes mellitus, type 2 06/27/2014  . 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 Tremor of both hands 04/05/2014  . Vertigo 04/05/2014  . BPPV (benign paroxysmal positional vertigo) 03/14/2014  . Seizures 12/16/2013  . Epigastric pain 11/01/2013  . Fever blister 10/16/2013  . Unspecified hypothyroidism 12/13/2012  . Other and unspecified hyperlipidemia 12/13/2012  . Anxiety state, unspecified 12/13/2012  . Edema 12/13/2012  . Anxiety state 12/13/2012  . Adult hypothyroidism 12/13/2012  . Type II or unspecified type diabetes mellitus without mention of complication, not stated as uncontrolled   . Urinary incontinence   . DEPRESSION 09/24/2007  . Essential hypertension 09/24/2007  . GERD 09/24/2007  . HIATAL HERNIA 09/24/2007  . 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 Headache(784.0) 09/24/2007    Fanny Dance 11/06/2014, 1:37 PM  [facility] [address] [place], [place], [address] Phone: [phone]   Fax:  [phone]

## 2014-11-06 NOTE — Therapy (Signed)
[facility] [address] [place], [place], [address] Phone: [phone]   Fax:  [phone]  Speech Language Pathology Treatment  Patient Details  Name: Kelly Fletcher MRN: [id number] Date of Birth: 09/29/1950 Referring Provider:  Estill Dooms, MD  Encounter Date: 11/06/2014      End of Session - 11/06/14 1412    Visit Number 7   Number of Visits 16   Date for SLP Re-Evaluation 12/08/14   SLP Start Time 1315   SLP Stop Time  1401   SLP Time Calculation (min) 46 min   Activity Tolerance Patient tolerated treatment well      Past Medical History  Diagnosis Date  . Uterine prolapse   . Cystocele   . GERD (gastroesophageal reflux disease)   . Elevated cholesterol   . Rectocele   . Hypertension   . Arthritis   . Type II or unspecified type diabetes mellitus without mention of complication, uncontrolled     Type 2  . Urinary incontinence     Urgency  . Anxiety state, unspecified   . Palpitations   . Allergic rhinitis, cause unspecified   . Other and unspecified hyperlipidemia   . Unspecified hypothyroidism   . Edema   . Insomnia, unspecified   . Depressive disorder, not elsewhere classified   . Unspecified urinary incontinence   . Peptic ulcer, unspecified site, unspecified as acute or chronic, without mention of hemorrhage, perforation, or obstruction   . Disturbance of skin sensation   . Calculus of kidney   . Facial spasm 04/05/2014  . Abnormal brain MRI 04/13/2014  . 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 Monilial vaginitis 10/23/2014    Past Surgical History  Procedure Laterality Date  . Tonsillectomy and adenoidectomy  1963  . Dilation and curettage of uterus      3-05  . Hysteroscopy      3-05  . Endometrial biopsy      Dr.Gottsegen   . Tongue biopsy      There were no vitals filed for this visit.  Visit Diagnosis:  Aphasia      Subjective Assessment - 11/06/14 1319    Subjective "Just been talking it out"   Currently in Pain? 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 Facilitated use of compensations during structured speech task of desciptions of animals for ST to guess with rare min A.    Assessment / Recommendations / Plan   Plan Continue with current plan of care   Progression Toward Goals   Progression toward goals Progressing toward goals            SLP Short Term Goals - 11/06/14 1410    SLP SHORT TERM GOAL #1   Title pt will complete HEP for motor speech  planning with slow rate 95% success with rare verbal cues   Time 1   Period Weeks   Status On-going   SLP SHORT TERM GOAL #2   Title pt will produce sentences using speech compensations with 85% fluency   Time 1   Status Achieved   SLP SHORT TERM GOAL #3   Title pt will tell SLP three compensations for improving motor planning for speech   Time 1   Status Achieved   SLP SHORT TERM GOAL #4   Title pt to participate in 5 minutes simple conversation with 85% fluency with modified independence   Time 1   Period Weeks   Status On-going          SLP Long Term Goals - 11/06/14 1411    SLP LONG TERM GOAL #1   Title pt will participate in 10 mintues mod complex conversation 90% fluency, with modified independence (compensations)   Time 5   Period Weeks   Status On-going   SLP LONG TERM  GOAL #2   Title pt will complete HEP with 90% fluency and modifed independence (compensations)   Time 5   Period Weeks   Status On-going   SLP LONG TERM GOAL #3   Title pt will participate in 7 minutes complex conversation with 85% fluency with modified independence   Time 5   Period Weeks   Status On-going          Plan - 11/06/14 1355    Clinical Impression Statement Pt continues to requied min A to utilize compensations for aphasia and apraxia. Continue skilled ST to maximize verbal expression for improved quality of life.    Speech Therapy Frequency 2x / week   Treatment/Interventions Cueing hierarchy;Functional tasks;Patient/family education;Compensatory strategies;SLP instruction and feedback   Potential to Achieve Goals Fair   Potential Considerations Co-morbidities;Severity of impairments   SLP Home Exercise Plan provided today - pt has existing HEP from [facility] which SLP encouraged her to continue   Consulted and Agree with Plan of Care Patient        Problem List Patient Active Problem List   Diagnosis Date Noted  . Monilial vaginitis 10/23/2014  . Deep vein thrombosis 08/12/2014  . History of inferior vena caval filter placement 07/14/2014  . Malignant glioma of brain 06/28/2014  . Diabetes mellitus, type 2 06/27/2014  . 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 Tremor of both hands 04/05/2014  . Vertigo 04/05/2014  . BPPV (benign paroxysmal positional vertigo) 03/14/2014  . Seizures 12/16/2013  . Epigastric pain 11/01/2013  . Fever blister 10/16/2013  . Unspecified hypothyroidism 12/13/2012  . Other and unspecified hyperlipidemia 12/13/2012  . Anxiety state, unspecified 12/13/2012  . Edema 12/13/2012  . Anxiety state 12/13/2012  . Adult hypothyroidism 12/13/2012  . Type II or unspecified type diabetes mellitus without mention of complication, not stated as uncontrolled   . Urinary incontinence   . DEPRESSION 09/24/2007  . Essential hypertension 09/24/2007   . GERD 09/24/2007  . HIATAL HERNIA 09/24/2007  . 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 Headache(784.0) 09/24/2007    Lovvorn, Annye Rusk MS, CCC-SLP 11/06/2014, 2:12 PM  [facility] [address] [place], [place], [address] Phone: [phone]   Fax:  [phone]

## 2014-11-06 NOTE — Therapy (Signed)
[facility] [address] [place], [place], [address] Phone: [phone]   Fax:  [phone]  Occupational Therapy Treatment  Patient Details  Name: Kelly Fletcher MRN: [id number] Date of Birth: Feb 16, 1951 Referring Provider:  Estill Dooms, MD  Encounter Date: 11/06/2014      OT End of Session - 11/06/14 1752    Visit Number 5   Number of Visits 17   Date for OT Re-Evaluation 12/08/14   Authorization Type MCR - G code needed   Authorization Time Period 60 Days, (week 1/8)   OT Start Time 1531   OT Stop Time 1616   OT Time Calculation (min) 45 min   Activity Tolerance Patient tolerated treatment well      Past Medical History  Diagnosis Date  . Uterine prolapse   . Cystocele   . GERD (gastroesophageal reflux disease)   . Elevated cholesterol   . Rectocele   . Hypertension   . Arthritis   . Type II or unspecified type diabetes mellitus without mention of complication, uncontrolled     Type 2  . Urinary incontinence     Urgency  . Anxiety state, unspecified   . Palpitations   . Allergic rhinitis, cause unspecified   . Other and unspecified hyperlipidemia   . Unspecified hypothyroidism   . Edema   . Insomnia, unspecified   . Depressive disorder, not elsewhere classified   . Unspecified urinary incontinence   . Peptic ulcer, unspecified site, unspecified as acute or chronic, without mention of hemorrhage, perforation, or obstruction   . Disturbance of skin sensation   . Calculus of kidney   . Facial spasm 04/05/2014  . Abnormal brain MRI 04/13/2014  . 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 Monilial vaginitis 10/23/2014    Past Surgical History  Procedure Laterality Date  . Tonsillectomy and adenoidectomy  1963  . Dilation and curettage of uterus      3-05  . Hysteroscopy      3-05  . Endometrial biopsy      Dr.Gottsegen   .  Tongue biopsy      There were no vitals filed for this visit.  Visit Diagnosis:  Pain in joint, shoulder region, right  Weakness of right arm      Subjective Assessment - 11/06/14 1538    Subjective  The pillows at home for sleeping do not help   Patient is accompained by: Family member   Pertinent History glioblastoma with brain surgery to remove tumor 06/28/14, facial seizures   Patient Stated Goals To get my Rt shoulder and hand better b/c I'm Rt handed   Currently in Pain? Yes   Pain Score 6    Pain Location Shoulder   Pain Orientation Left   Pain Descriptors / Indicators Sore   Pain Type Chronic pain   Pain Onset More than a month ago   Pain Frequency Intermittent   Aggravating Factors  movement of arm   Pain Relieving Factors ice, heat, OT treatments   Multiple Pain Sites Yes                      OT Treatments/Exercises (OP) - 11/06/14 0001    Neurological Re-education Exercises   Other Exercises 1 Neuro re ed to address shoulder flexion/extension, chest presses with ball, elbow flexion/extension and shoulder abduction/adduction all in supine. Pt initially  started session stating severe pain and reporting she is not moving her arm at all at home "because it hurts'.  Pt with poor ability to localize pain as it appears to be along the line of bicpe tendon and not as much in shoulder. Shoulder was stiff and therefore painful but able to range stiffness out and pt able to participate in active movement by end of session. Pt also able by end of session to sit EOM lean forward to work on shoulder flexion to 110* without any pain at all. Again recommended pt seek MD assistance with pain issues. Pt would also benefit from rehab MD consult.   Moist Heat Therapy   Number Minutes Moist Heat 8 Minutes  simultaneously with manual therapy   Moist Heat Location Shoulder   Manual Therapy   Manual Therapy Joint mobilization;Soft tissue mobilization   Manual therapy comments  Joint mob and soft tissue mob to address severe pain and stiffness with pt with good results. Pt reported pain at 4/10 at end of session (baseline 7/10)                  OT Short Term Goals - 11/06/14 1750    OT SHORT TERM GOAL #1   Title Independent with HEP for Rt hand coordination, RUE ROM and Rt hand strength (DUE 11/08/14)   Time 4   Period Weeks   Status On-going   OT SHORT TERM GOAL #2   Title Pt to verbalize understanding with pain management strategies RUE   Time 4   Period Weeks   Status On-going   OT SHORT TERM GOAL #3   Title Pt to write name at 75% or greater legibility w/ A/E for pen prn Rt dominant hand   Baseline approx. 25% first name only with built up pen   Time 4   Period Weeks   Status On-going   OT SHORT TERM GOAL #4   Title Improve RUE functional use as evidenced by performing 25 blocks or greater on Box & Blocks test   Baseline eval = 17   Time 4   Period Weeks   Status On-going   OT SHORT TERM GOAL #5   Title Pt to improve grip strength Rt hand to 20 lbs or greater to decrease drops   Baseline eval = 10 lbs (Lt = 55 lbs)   Time 4   Period Weeks   Status On-going   OT SHORT TERM GOAL #6   Title Pt to tie shoes and hook bra mod I level incorporating Rt dominant hand for assist   Baseline dependent   Time 4   Period Weeks   Status On-going   OT SHORT TERM GOAL #7   Title Pt to use Rt dominant hand to feed [place] and groom [place] 50% of the time    Baseline none with Rt hand, using Lt non dominant hand   Time 4   Period Weeks   Status On-going           OT Long Term Goals - 11/06/14 1750    OT LONG TERM GOAL #1   Title Independent with updated HEP for RUE strength (DUE 12/08/14)   Time 8   Period Weeks   Status On-going   OT LONG TERM GOAL #2   Title Pt to perform high level reaching RUE to retrieve/replace 3 lb. object from high shelf 10 times w/o rest   Baseline only 75% shoulder flexion   Time 8  Period Weeks   Status On-going    OT LONG TERM GOAL #3   Title Improve RUE functional use as evidenced by performing 40 blocks on Box & Blocks test    Baseline eval = 17   Time 8   Period Weeks   Status On-going   OT LONG TERM GOAL #4   Title Improve coordination as evidenced by performing 9 hole peg test in under 90 sec.    Baseline eval: only able to place 1 peg in 60 sec.    Time 8   Period Weeks   Status On-going   OT LONG TERM GOAL #5   Title Pt to improve grip strength Rt dominant hand to 30 lbs or greater to open jars/containers   Baseline eval = 10 lbs   Time 8   Period Weeks   Status On-going   OT LONG TERM GOAL #6   Title Pt to return to using Rt hand as dominant hand for all BADLS 08% of the time or greater   Baseline only using LUE   Time 8   Period Weeks   Status On-going   OT LONG TERM GOAL #7   Title Pt to write short paragraph with Rt dominant hand using A/E prn with 90% legibility    Baseline 25% legibility for first name only   Time 8   Period Weeks   Status On-going   OT LONG TERM GOAL #8   Title Pt to return to light cooking/cleaning tasks mod I level with A/E and/or DME prn   Baseline dependent   Time 8   Period Weeks   Status On-going               Plan - 11/06/14 1751    Clinical Impression Statement Pt with severe pain today.  Responded well to treatment and friend here to assist with activities at home.  Pt encouraged to work on decreasing guarding of arm and to do exercises within her toleration of pain.    Pt will benefit from skilled therapeutic intervention in order to improve on the following deficits (Retired) Decreased coordination;Decreased range of motion;Difficulty walking;Decreased safety awareness;Decreased endurance;Increased edema;Impaired sensation;Decreased activity tolerance;Decreased knowledge of precautions;Impaired tone;Decreased balance;Decreased knowledge of use of DME;Impaired UE functional use;Pain;Decreased cognition;Decreased mobility;Decreased  strength   Rehab Potential Good   Clinical Impairments Affecting Rehab Potential Pt will benefit from skilled OT to address deficits (see above)   OT Frequency 2x / week   OT Duration 8 weeks   OT Treatment/Interventions [place]-care/ADL training;Fluidtherapy;Moist Heat;DME and/or AE instruction;Splinting;Patient/family education;Therapeutic exercises;Balance training;Therapeutic activities;Neuromuscular education;Functional Mobility Training;Passive range of motion;Cognitive remediation/compensation;Electrical Stimulation;Parrafin;Energy conservation;Manual Therapy   Plan address shoulder pain, neuro re ed   Consulted and Agree with Plan of Care Patient   Family Member Consulted HUSBAND        Problem List Patient Active Problem List   Diagnosis Date Noted  . Monilial vaginitis 10/23/2014  . Deep vein thrombosis 08/12/2014  . History of inferior vena caval filter placement 07/14/2014  . Malignant glioma of brain 06/28/2014  . Diabetes mellitus, type 2 06/27/2014  . 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 Tremor of both hands 04/05/2014  . Vertigo 04/05/2014  . BPPV (benign paroxysmal positional vertigo) 03/14/2014  . Seizures 12/16/2013  . Epigastric pain 11/01/2013  . Fever blister 10/16/2013  . Unspecified hypothyroidism 12/13/2012  . Other and unspecified hyperlipidemia 12/13/2012  . Anxiety state, unspecified 12/13/2012  . Edema 12/13/2012  . Anxiety state 12/13/2012  .  Adult hypothyroidism 12/13/2012  . Type II or unspecified type diabetes mellitus without mention of complication, not stated as uncontrolled   . Urinary incontinence   . DEPRESSION 09/24/2007  . Essential hypertension 09/24/2007  . GERD 09/24/2007  . HIATAL HERNIA 09/24/2007  . 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 Headache(784.0) 09/24/2007    Quay Burow, OTR/L 11/06/2014, 5:53 PM  [facility] [address] [place], [place],  [address] Phone: [phone]   Fax:  [phone]

## 2014-11-06 NOTE — Patient Instructions (Signed)
  Red Leather, Yellow Leather  Flutter by [employer]  A [place] Copper Coffee Pot  Three Free Throws  Six Thick Thistles Stick  Cinnamon Aluminum Linoleum  [employer]  [place] educational needs teacher [employer]

## 2014-11-08 ENCOUNTER — Ambulatory Visit: Payer: Medicare Other | Admitting: Speech Pathology

## 2014-11-08 ENCOUNTER — Ambulatory Visit: Payer: Medicare Other | Admitting: Physical Therapy

## 2014-11-08 ENCOUNTER — Ambulatory Visit: Payer: Medicare Other | Admitting: Occupational Therapy

## 2014-11-08 ENCOUNTER — Encounter: Payer: Self-pay | Admitting: Occupational Therapy

## 2014-11-08 DIAGNOSIS — R269 Unspecified abnormalities of gait and mobility: Secondary | ICD-10-CM

## 2014-11-08 DIAGNOSIS — R6889 Other general symptoms and signs: Secondary | ICD-10-CM

## 2014-11-08 DIAGNOSIS — R4189 Other symptoms and signs involving cognitive functions and awareness: Secondary | ICD-10-CM

## 2014-11-08 DIAGNOSIS — R278 Other lack of coordination: Secondary | ICD-10-CM

## 2014-11-08 DIAGNOSIS — R29818 Other symptoms and signs involving the nervous system: Secondary | ICD-10-CM | POA: Diagnosis not present

## 2014-11-08 DIAGNOSIS — R2689 Other abnormalities of gait and mobility: Secondary | ICD-10-CM

## 2014-11-08 DIAGNOSIS — R29898 Other symptoms and signs involving the musculoskeletal system: Secondary | ICD-10-CM

## 2014-11-08 DIAGNOSIS — M25511 Pain in right shoulder: Secondary | ICD-10-CM

## 2014-11-08 NOTE — Patient Instructions (Addendum)
Exercises for Arm at Home: Please do with supervision either from your friend or your husband. Do 2 times a day if possible.  1. Lay on your back with arm supported on a pillow.  Helper supports at hand. Bend elbow then straighten completely.  Repeat. Do 10 rest then do 10 more.    2. Lay on your back with arm supported on a pillow. Keep elbow bent. Helper takes the weight of the am. Slide the elbow away from your body, then return elbow to the side. You are in control and this active on your part (helper does not pull the arm just supports the weight and offers encouragement).  3. Lay on your back. Hold a ball between your hands.  Reach up to toward the ceiling until your elbows are straight. Return ball to chest.  Helper just makes sure to guard the arm in case it gives out. Helper may need to take some of the weight as she brings the ball back down to her chest. Do 10, rest then do 10 more.  Move slowly and within your range of pain free movement.  4. Lay on your back. Hold ball between your knees. Reach toward the ceiling until elbows are straight. Keeping elbow straight, reach the ball toward your thighs then reach toward the ceiling (ball will be over the chest). Helper just makes sure to guard the arm in case it gives out and may need to support some of the weight of the arm.  Do 10, rest then do 10 more.  Move slowly and within your range of pain free movement.  5. Sit on firm surface.  Sit up straight.  Clasp your hands together.  Fix eyes on a target to keep head up.  Bend over and reach toward the floor.  Slowly reach arms forward as long as there is no pain and the shoulder isn't hiking. Return arms and sit up. Do 10, rest then do 10 more.

## 2014-11-08 NOTE — Therapy (Signed)
[facility] [address] [place], [place], [address] Phone: [phone]   Fax:  [phone]  Occupational Therapy Treatment  Patient Details  Name: Kelly Fletcher MRN: [id number] Date of Birth: 1950-06-03 Referring Provider:  Estill Dooms, MD  Encounter Date: 11/08/2014      OT End of Session - 11/08/14 1034    Visit Number 6   Number of Visits 17   Date for OT Re-Evaluation 12/08/14   Authorization Type MCR - G code needed   Authorization Time Period 60 Days   OT Start Time 0847   OT Stop Time 0955   OT Time Calculation (min) 68 min   Activity Tolerance Patient tolerated treatment well      Past Medical History  Diagnosis Date  . Uterine prolapse   . Cystocele   . GERD (gastroesophageal reflux disease)   . Elevated cholesterol   . Rectocele   . Hypertension   . Arthritis   . Type II or unspecified type diabetes mellitus without mention of complication, uncontrolled     Type 2  . Urinary incontinence     Urgency  . Anxiety state, unspecified   . Palpitations   . Allergic rhinitis, cause unspecified   . Other and unspecified hyperlipidemia   . Unspecified hypothyroidism   . Edema   . Insomnia, unspecified   . Depressive disorder, not elsewhere classified   . Unspecified urinary incontinence   . Peptic ulcer, unspecified site, unspecified as acute or chronic, without mention of hemorrhage, perforation, or obstruction   . Disturbance of skin sensation   . Calculus of kidney   . Facial spasm 04/05/2014  . Abnormal brain MRI 04/13/2014  . 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 Monilial vaginitis 10/23/2014    Past Surgical History  Procedure Laterality Date  . Tonsillectomy and adenoidectomy  1963  . Dilation and curettage of uterus      3-05  . Hysteroscopy      3-05  . Endometrial biopsy      Dr.Gottsegen   . Tongue biopsy       There were no vitals filed for this visit.  Visit Diagnosis:  Pain in joint, shoulder region, right  Weakness of right arm  Cognitive deficits      Subjective Assessment - 11/08/14 0857    Subjective  I slept for the first time last night   Patient is accompained by: Family member  husband   Pertinent History glioblastoma with brain surgery to remove tumor 06/28/14, facial seizures   Patient Stated Goals To get my Rt shoulder and hand better b/c I'm Rt handed   Currently in Pain? Yes   Pain Score 5    Pain Location Shoulder   Pain Orientation Right   Pain Descriptors / Indicators Sore   Pain Type Chronic pain   Pain Onset More than a month ago   Pain Frequency Intermittent   Aggravating Factors  movement of arm and positioning   Pain Relieving Factors ice, heat and OT treatments                      OT Treatments/Exercises (OP) - 11/08/14 0001    Neurological Re-education Exercises   Other Exercises 1 Neuro re ed follwing heat and manual techniques to address active movement in RUE in supine and in sitting in pain free  ranges (please see pt instruction for details).  Husband educated on how to assist pt with activities at home and able to return demonstrate all activities. Husband also video recorded activities as HEP. Discussed positioning of RUE in sitting and in sleeping again. Reinforced not only positiong of RUE but head as well to keep shoulder girdle in better alignment.  At end of session pt reported 0/10 pain in sitting with arm at rest, and 0/10 with all activities. Pt does continue to have pain with functional tasks;  discused need to avoid mid to high reach and to avoid activities right now that inflame pain in the shoulder. Pt has very poor sensation and propriception and is not always able to monitor this. Husband educated and will cue pt when necessary.   Moist Heat Therapy   Number Minutes Moist Heat 8 Minutes  simulataneously with manual therapy    Moist Heat Location Shoulder   Manual Therapy   Manual Therapy Joint mobilization;Soft tissue mobilization   Manual therapy comments Pt continues with R shoulder pain today however reports it is less and that she was able to sleep last night.  Pt reports pain at 5/10 .  Joint mob and soft tissue mob to reduce tightness, improve alignment and address PROM then AROM of RUE.  Pt with great apprehension in moving arm and requires max encouragement and vc's to relax - pt with siginificant guarding of arm but does respond to vc's and tone of voice to relax.                  OT Education - 11/08/14 1028    Education provided Yes   Education Details HEP for [employer]) Educated Patient;Spouse   Methods Explanation;Demonstration;Tactile cues;Verbal cues;Handout   Comprehension Verbalized understanding;Returned demonstration  pt's husband to supervise and assist with all activities          OT Short Term Goals - 11/08/14 1028    OT SHORT TERM GOAL #1   Title Independent with HEP for Rt hand coordination, RUE ROM and Rt hand strength (DUE 11/08/14)   Time 4   Period Weeks   Status Not Met  Pt has had severe pain in R shoulder therefore has not met any STG's as treatment has needed to focus on addressing pain   OT SHORT TERM GOAL #2   Title Pt to verbalize understanding with pain management strategies RUE   Time 4   Period Weeks   Status Partially Met  able to verbalize some positioning and movement strategies   OT SHORT TERM GOAL #3   Title Pt to write name at 75% or greater legibility w/ A/E for pen prn Rt dominant hand   Baseline approx. 25% first name only with built up pen   Time 4   Period Weeks   Status Not Met   OT SHORT TERM GOAL #4   Title Improve RUE functional use as evidenced by performing 25 blocks or greater on Box & Blocks test   Baseline eval = 17   Time 4   Period Weeks   Status Not Met   OT SHORT TERM GOAL #5   Title Pt to improve grip strength Rt hand  to 20 lbs or greater to decrease drops   Baseline eval = 10 lbs (Lt = 55 lbs)   Time 4   Period Weeks   Status Not Met   OT SHORT TERM GOAL #6   Title Pt to tie shoes  and hook bra mod I level incorporating Rt dominant hand for assist   Baseline dependent   Time 4   Period Weeks   Status Not Met   OT SHORT TERM GOAL #7   Title Pt to use Rt dominant hand to feed [place] and groom [place] 50% of the time    Baseline none with Rt hand, using Lt non dominant hand   Time 4   Period Weeks   Status Not Met           OT Long Term Goals - 11/08/14 1030    OT LONG TERM GOAL #1   Title Independent with updated HEP for RUE strength (DUE 12/08/14)   Time 8   Period Weeks   Status On-going   OT LONG TERM GOAL #2   Title Pt to perform high level reaching RUE to retrieve/replace 3 lb. object from high shelf 10 times w/o rest   Baseline only 75% shoulder flexion   Time 8   Period Weeks   Status On-going   OT LONG TERM GOAL #3   Title Improve RUE functional use as evidenced by performing 40 blocks on Box & Blocks test    Baseline eval = 17   Time 8   Period Weeks   Status On-going   OT LONG TERM GOAL #4   Title Improve coordination as evidenced by performing 9 hole peg test in under 90 sec.    Baseline eval: only able to place 1 peg in 60 sec.    Time 8   Period Weeks   Status On-going   OT LONG TERM GOAL #5   Title Pt to improve grip strength Rt dominant hand to 30 lbs or greater to open jars/containers   Baseline eval = 10 lbs   Time 8   Period Weeks   Status On-going   OT LONG TERM GOAL #6   Title Pt to return to using Rt hand as dominant hand for all BADLS 13% of the time or greater   Baseline only using LUE   Time 8   Period Weeks   Status On-going   OT LONG TERM GOAL #7   Title Pt to write short paragraph with Rt dominant hand using A/E prn with 90% legibility    Baseline 25% legibility for first name only   Time 8   Period Weeks   Status On-going   OT LONG TERM GOAL #8    Title Pt to return to light cooking/cleaning tasks mod I level with A/E and/or DME prn   Baseline dependent   Time 8   Period Weeks   Status On-going               Plan - 11/08/14 1031    Clinical Impression Statement Pt returns today with improvement in pain in RUE but still significant. By end of session pt reported 0/10 pain with arm at rest and with neuro HEP.  Will refer pt to physiatrist to address possible bicep tendonittis as well as to have rehab MD involved in rehab process - pt and husband in agreement. Pt has not met any STG's as she has had severe pain in her RUE and therapy focus has been diverted to relieving pain and increasing active , pain free movement in RUE.  WIll adjust goal date accordingly.     Pt will benefit from skilled therapeutic intervention in order to improve on the following deficits (Retired) Decreased coordination;Decreased range of motion;Difficulty walking;Decreased safety  awareness;Decreased endurance;Increased edema;Impaired sensation;Decreased activity tolerance;Decreased knowledge of precautions;Impaired tone;Decreased balance;Decreased knowledge of use of DME;Impaired UE functional use;Pain;Decreased cognition;Decreased mobility;Decreased strength   Rehab Potential Good   Clinical Impairments Affecting Rehab Potential Pt will benefit from skilled OT to address deficits (see above)   OT Frequency 2x / week   OT Duration 8 weeks   OT Treatment/Interventions [place]-care/ADL training;Fluidtherapy;Moist Heat;DME and/or AE instruction;Splinting;Patient/family education;Therapeutic exercises;Balance training;Therapeutic activities;Neuromuscular education;Functional Mobility Training;Passive range of motion;Cognitive remediation/compensation;Electrical Stimulation;Parrafin;Energy conservation;Manual Therapy   Plan address shoulder pain, neuro re ed to RUE   Consulted and Agree with Plan of Care Patient;Family member/caregiver   Family Member Consulted HUSBAND         Problem List Patient Active Problem List   Diagnosis Date Noted  . Monilial vaginitis 10/23/2014  . Deep vein thrombosis 08/12/2014  . History of inferior vena caval filter placement 07/14/2014  . Malignant glioma of brain 06/28/2014  . Diabetes mellitus, type 2 06/27/2014  . 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 Tremor of both hands 04/05/2014  . Vertigo 04/05/2014  . BPPV (benign paroxysmal positional vertigo) 03/14/2014  . Seizures 12/16/2013  . Epigastric pain 11/01/2013  . Fever blister 10/16/2013  . Unspecified hypothyroidism 12/13/2012  . Other and unspecified hyperlipidemia 12/13/2012  . Anxiety state, unspecified 12/13/2012  . Edema 12/13/2012  . Anxiety state 12/13/2012  . Adult hypothyroidism 12/13/2012  . Type II or unspecified type diabetes mellitus without mention of complication, not stated as uncontrolled   . Urinary incontinence   . DEPRESSION 09/24/2007  . Essential hypertension 09/24/2007  . GERD 09/24/2007  . HIATAL HERNIA 09/24/2007  . 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 Headache(784.0) 09/24/2007    Quay Burow, OTR/L 11/08/2014, 10:40 AM  [facility] [address] [place], [place], [address] Phone: [phone]   Fax:  [phone]

## 2014-11-08 NOTE — Therapy (Signed)
[facility] [address] [place], [place], [address] Phone: [phone]   Fax:  [phone]  Physical Therapy Treatment  Patient Details  Name: Kelly Fletcher MRN: [id number] Date of Birth: 1951/01/17 Referring Provider:  Estill Dooms, MD  Encounter Date: 11/08/2014      PT End of Session - 11/08/14 1339    Visit Number 5   Number of Visits 17   Date for PT Re-Evaluation 12/10/14   Authorization Type G-code & KX modifier   PT Start Time 0800   PT Stop Time 0846   PT Time Calculation (min) 46 min   Equipment Utilized During Treatment Gait belt   Activity Tolerance Patient tolerated treatment well   Behavior During Therapy [facility] for tasks assessed/performed      Past Medical History  Diagnosis Date  . Uterine prolapse   . Cystocele   . GERD (gastroesophageal reflux disease)   . Elevated cholesterol   . Rectocele   . Hypertension   . Arthritis   . Type II or unspecified type diabetes mellitus without mention of complication, uncontrolled     Type 2  . Urinary incontinence     Urgency  . Anxiety state, unspecified   . Palpitations   . Allergic rhinitis, cause unspecified   . Other and unspecified hyperlipidemia   . Unspecified hypothyroidism   . Edema   . Insomnia, unspecified   . Depressive disorder, not elsewhere classified   . Unspecified urinary incontinence   . Peptic ulcer, unspecified site, unspecified as acute or chronic, without mention of hemorrhage, perforation, or obstruction   . Disturbance of skin sensation   . Calculus of kidney   . Facial spasm 04/05/2014  . Abnormal brain MRI 04/13/2014  . 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 Monilial vaginitis 10/23/2014    Past Surgical History  Procedure Laterality Date  . Tonsillectomy and adenoidectomy  1963  . Dilation and curettage of uterus      3-05  . Hysteroscopy     3-05  . Endometrial biopsy      Dr.Gottsegen   . Tongue biopsy      There were no vitals filed for this visit.  Visit Diagnosis:  Abnormality of gait  Balance problems  Decreased functional activity tolerance  Coordination abnormal      Subjective Assessment - 11/08/14 0808    Subjective pt reports similar shoulder/hand pain today, with her hand bothering her more than her shoulder today. pt states she she feels her HEP is helping with her balance. pt reports no changes or recent falls.    Patient is accompained by: Family member   Patient Stated Goals I want to walk better especially stairs, endurance, multi-task like talk & walk   Currently in Pain? Yes   Pain Score 6    Pain Location Shoulder   Pain Orientation Left   Pain Type Chronic pain       Treatment     [facility] PT Assessment - 11/08/14 0001    Berg Balance Test   Sit to Stand Able to stand without using hands and stabilize independently   Standing Unsupported Able to stand safely 2 minutes   Sitting with Back Unsupported but Feet Supported on Floor or Stool Able to sit safely and securely 2 minutes   Stand to Sit Sits safely with minimal use of hands  Transfers Able to transfer safely, minor use of hands   Standing Unsupported with Eyes Closed Able to stand 10 seconds safely   Standing Ubsupported with Feet Together Able to place feet together independently and stand 1 minute safely   From Standing, Reach Forward with Outstretched Arm Can reach confidently >25 cm (10")   From Standing Position, Pick up Object from Floor Able to pick up shoe safely and easily   From Standing Position, Turn to Look Behind Over each Shoulder Looks behind from both sides and weight shifts well   Turn 360 Degrees Able to turn 360 degrees safely but slowly   Standing Unsupported, Alternately Place Feet on Step/Stool Able to stand independently and safely and complete 8 steps in 20 seconds   Standing Unsupported, One Foot in Front  Able to place foot tandem independently and hold 30 seconds   Standing on One Leg Able to lift leg independently and hold > 10 seconds  SLS on right leg limited   Total Score 54     Neuromuscular Re-education: -NuStep legs only level 4 x 8 minutes for strengthening and activity tolerance.  -Both red mats side by side: 4 laps each with min guard assist to SBA for balance High knee marches fwd/bwd Tandem gait fwd Toe walking fwd Heel walking fwd Side lunges -Stair training x 5 reps. Pt practiced reciprocal stepping pattern using UE support only as needed. SBA.   -Gait training with ski poles x 2 laps indoor level surfaces to promote arm swing and longer stride lengths.  -All short term goals were performed/assessed. Refer to goals for activities.               PT Short Term Goals - 11/08/14 4917    PT SHORT TERM GOAL #1   Title patient correcty demonstrates initial HEP with husband cueing. (Target Date: 11/09/2014)   Time 4   Period Weeks   Status Achieved   PT SHORT TERM GOAL #2   Title Timed Up & Go <13.5 sec without device (Target Date: 11/09/2014)  Current time is 8.13 seconds (11/08/14)   Time 4   Period Weeks   Status Achieved   PT SHORT TERM GOAL #3   Title Berg Balance >46/56 (Target Date: 11/09/2014)  Current Kelly Fletcher is 54/56 (11/08/14)   Time 4   Period Weeks   Status Achieved   PT SHORT TERM GOAL #4   Title ambulates 500' without device including grass surfaces, ramps & curbs with supervision. (Target Date: 11/09/2014)   Time 4   Period Weeks   Status Achieved           PT Long Term Goals - 10/10/14 1354    PT LONG TERM GOAL #1   Title demonstrates / verbalizes understanding of ongoing HEP / fitness plan. (Target Date: 12/07/2014)   Time 8   Period Weeks   Status New   PT LONG TERM GOAL #2   Title Cognitive Timed Up & Go <15 sec safely  (Target Date: 12/07/2014)   Time 8   Period Weeks   Status New   PT LONG TERM GOAL #3   Title Berg Balance >/=52/56   (Target Date: 12/07/2014)   Time 8   Period Weeks   Status New   PT LONG TERM GOAL #4   Title Functional Gait Assessment >24/ 30  (Target Date: 12/07/2014)   Time 8   Period Weeks   Status New   PT LONG TERM GOAL #5  Title ambulates >1000' without device including grass, ramps, curbs, stairs indpendently.  (Target Date: 12/07/2014)   Time 8   Period Weeks   Status New               Plan - 11/08/14 1340    Clinical Impression Statement pt met all STGs today and demo significant improvements in balance and independence with gait. pt is now progressing towards LTGs. pt would benefit with continued balance training for dynamic activities/ LE strenghtening to improve activity tolerance.    Pt will benefit from skilled therapeutic intervention in order to improve on the following deficits Abnormal gait;Decreased activity tolerance;Decreased balance;Decreased coordination;Decreased endurance;Decreased mobility   Rehab Potential Good   PT Frequency 2x / week   PT Duration 8 weeks   PT Treatment/Interventions ADLs/[facility];DME Instruction;Gait training;Stair training;Functional mobility training;Therapeutic activities;Therapeutic exercise;Balance training;Neuromuscular re-education;Patient/family education   PT Next Visit Plan continue to address balance (now focusing on higher level dynamic balance), coordination, multi-tasking with activities, activity tolerance, stair training   Consulted and Agree with Plan of Care Patient;Family member/caregiver   Family Member Consulted husband        Problem List Patient Active Problem List   Diagnosis Date Noted  . Monilial vaginitis 10/23/2014  . Deep vein thrombosis 08/12/2014  . History of inferior vena caval filter placement 07/14/2014  . Malignant glioma of brain 06/28/2014  . Diabetes mellitus, type 2 06/27/2014  . 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 Tremor of both hands 04/05/2014  . Vertigo 04/05/2014  .  BPPV (benign paroxysmal positional vertigo) 03/14/2014  . Seizures 12/16/2013  . Epigastric pain 11/01/2013  . Fever blister 10/16/2013  . Unspecified hypothyroidism 12/13/2012  . Other and unspecified hyperlipidemia 12/13/2012  . Anxiety state, unspecified 12/13/2012  . Edema 12/13/2012  . Anxiety state 12/13/2012  . Adult hypothyroidism 12/13/2012  . Type II or unspecified type diabetes mellitus without mention of complication, not stated as uncontrolled   . Urinary incontinence   . DEPRESSION 09/24/2007  . Essential hypertension 09/24/2007  . GERD 09/24/2007  . HIATAL HERNIA 09/24/2007  . 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 Headache(784.0) 09/24/2007    Fanny Dance, Student-PT   Fanny Dance 11/08/2014, 1:53 PM  [facility] [address] [place], [place], [address] Phone: [phone]   Fax:  [phone]

## 2014-11-12 ENCOUNTER — Ambulatory Visit: Payer: Medicare Other

## 2014-11-12 ENCOUNTER — Encounter: Payer: Self-pay | Admitting: Physical Therapy

## 2014-11-12 ENCOUNTER — Ambulatory Visit: Payer: Medicare Other | Admitting: Occupational Therapy

## 2014-11-12 ENCOUNTER — Ambulatory Visit: Payer: Medicare Other | Admitting: Physical Therapy

## 2014-11-12 ENCOUNTER — Encounter: Payer: Self-pay | Admitting: Occupational Therapy

## 2014-11-12 DIAGNOSIS — R29898 Other symptoms and signs involving the musculoskeletal system: Secondary | ICD-10-CM

## 2014-11-12 DIAGNOSIS — R269 Unspecified abnormalities of gait and mobility: Secondary | ICD-10-CM

## 2014-11-12 DIAGNOSIS — R29818 Other symptoms and signs involving the nervous system: Secondary | ICD-10-CM | POA: Diagnosis not present

## 2014-11-12 DIAGNOSIS — R4701 Aphasia: Secondary | ICD-10-CM

## 2014-11-12 DIAGNOSIS — R4189 Other symptoms and signs involving cognitive functions and awareness: Secondary | ICD-10-CM

## 2014-11-12 DIAGNOSIS — R2689 Other abnormalities of gait and mobility: Secondary | ICD-10-CM

## 2014-11-12 DIAGNOSIS — M25511 Pain in right shoulder: Secondary | ICD-10-CM

## 2014-11-12 NOTE — Therapy (Signed)
[facility] [address] [place], [place], [address] Phone: [phone]   Fax:  [phone]  Speech Language Pathology Treatment  Patient Details  Name: Kelly Fletcher MRN: [id number] Date of Birth: 1951/01/02 Referring Provider:  Estill Dooms, MD  Encounter Date: 11/12/2014      End of Session - 11/12/14 1100    Visit Number 8   Number of Visits 16   Date for SLP Re-Evaluation 12/08/14   SLP Start Time 26   SLP Stop Time  1100   SLP Time Calculation (min) 40 min   Activity Tolerance Patient tolerated treatment well      Past Medical History  Diagnosis Date  . Uterine prolapse   . Cystocele   . GERD (gastroesophageal reflux disease)   . Elevated cholesterol   . Rectocele   . Hypertension   . Arthritis   . Type II or unspecified type diabetes mellitus without mention of complication, uncontrolled     Type 2  . Urinary incontinence     Urgency  . Anxiety state, unspecified   . Palpitations   . Allergic rhinitis, cause unspecified   . Other and unspecified hyperlipidemia   . Unspecified hypothyroidism   . Edema   . Insomnia, unspecified   . Depressive disorder, not elsewhere classified   . Unspecified urinary incontinence   . Peptic ulcer, unspecified site, unspecified as acute or chronic, without mention of hemorrhage, perforation, or obstruction   . Disturbance of skin sensation   . Calculus of kidney   . Facial spasm 04/05/2014  . Abnormal brain MRI 04/13/2014  . 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 Monilial vaginitis 10/23/2014    Past Surgical History  Procedure Laterality Date  . Tonsillectomy and adenoidectomy  1963  . Dilation and curettage of uterus      3-05  . Hysteroscopy      3-05  . Endometrial biopsy      Dr.Gottsegen   . Tongue biopsy      There were no vitals filed for this visit.  Visit Diagnosis:  Aphasia             ADULT SLP TREATMENT - 11/12/14 1021    General Information   Behavior/Cognition Alert;Cooperative;Pleasant mood   Treatment Provided   Treatment provided Cognitive-Linquistic   Pain Assessment   Pain Assessment 0-10   Pain Score 4    Pain Location shoulder   Pain Descriptors / Indicators Aching   Pain Intervention(s) Monitored during session   Cognitive-Linquistic Treatment   Treatment focused on Apraxia   Skilled Treatment Pt utlized slow rate and overarticulation to generate sentences in structured tasks with rare min A for compensations. Facilitated use of compensations during conversation with rare min A.    Assessment / Recommendations / Plan   Plan --  Pt would like to concentrate on OT, PT and decr ST to x1/wk   Progression Toward Goals   Progression toward goals Progressing toward goals            SLP Short Term Goals - 11/12/14 1101    SLP SHORT TERM GOAL #1   Title pt will complete HEP for motor speech planning with slow rate 95% success with rare verbal cues   Time 1   Period Weeks   Status Achieved   SLP SHORT TERM GOAL #2   Title pt will  produce sentences using speech compensations with 85% fluency   Time 1   Status Achieved   SLP SHORT TERM GOAL #3   Title pt will tell SLP three compensations for improving motor planning for speech   Time 1   Status Achieved   SLP SHORT TERM GOAL #4   Title pt to participate in 5 minutes simple conversation with 85% fluency with modified independence   Time 1   Period Weeks   Status Achieved          SLP Long Term Goals - 11/12/14 1102    SLP LONG TERM GOAL #1   Title pt will participate in 10 mintues mod complex conversation 90% fluency, with modified independence (compensations)   Time --   Period --   Status Achieved   SLP LONG TERM GOAL #2   Title pt will complete HEP with 90% fluency and modifed independence (compensations)   Time 4   Period Weeks   Status On-going   SLP  LONG TERM GOAL #3   Title pt will participate in 7 minutes complex conversation with 85% fluency with modified independence   Time 4   Period Weeks   Status On-going          Plan - 11/12/14 1100    Clinical Impression Statement Min A cont to be req'd for speech tasks (Structured tasks and conversation). Pt would like to cont at x1/week to focus on OT and PT.   Speech Therapy Frequency 1x /week   Duration 4 weeks   Treatment/Interventions Cueing hierarchy;Functional tasks;Patient/family education;Compensatory strategies;SLP instruction and feedback   Potential to Achieve Goals Fair        Problem List Patient Active Problem List   Diagnosis Date Noted  . Monilial vaginitis 10/23/2014  . Deep vein thrombosis 08/12/2014  . History of inferior vena caval filter placement 07/14/2014  . Malignant glioma of brain 06/28/2014  . Diabetes mellitus, type 2 06/27/2014  . 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 Tremor of both hands 04/05/2014  . Vertigo 04/05/2014  . BPPV (benign paroxysmal positional vertigo) 03/14/2014  . Seizures 12/16/2013  . Epigastric pain 11/01/2013  . Fever blister 10/16/2013  . Unspecified hypothyroidism 12/13/2012  . Other and unspecified hyperlipidemia 12/13/2012  . Anxiety state, unspecified 12/13/2012  . Edema 12/13/2012  . Anxiety state 12/13/2012  . Adult hypothyroidism 12/13/2012  . Type II or unspecified type diabetes mellitus without mention of complication, not stated as uncontrolled   . Urinary incontinence   . DEPRESSION 09/24/2007  . Essential hypertension 09/24/2007  . GERD 09/24/2007  . HIATAL HERNIA 09/24/2007  . 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 Headache(784.0) 09/24/2007    SCHINKE,CARL , [place], [place]  11/12/2014, 11:04 AM  [facility] [address] [place], [place], [address] Phone: [phone]   Fax:  [phone]

## 2014-11-12 NOTE — Patient Instructions (Signed)
  Continue with your speech exercises we have provided to you, 15-20 minutes, twice a day.

## 2014-11-12 NOTE — Therapy (Signed)
[facility] [address] [place], [place], [address] Phone: [phone]   Fax:  [phone]  Occupational Therapy Treatment  Patient Details  Name: Kelly Fletcher MRN: [id number] Date of Birth: [birth year] Referring Provider:  Estill Dooms, MD  Encounter Date: 11/12/2014      OT End of Session - 11/12/14 1250    Visit Number 7   Number of Visits 17   Date for OT Re-Evaluation 12/08/14   Authorization Type MCR - G code needed   Authorization Time Period 60 Days   OT Start Time 0930   OT Stop Time 1015   OT Time Calculation (min) 45 min   Activity Tolerance Patient tolerated treatment well      Past Medical History  Diagnosis Date  . Uterine prolapse   . Cystocele   . GERD (gastroesophageal reflux disease)   . Elevated cholesterol   . Rectocele   . Hypertension   . Arthritis   . Type II or unspecified type diabetes mellitus without mention of complication, uncontrolled     Type 2  . Urinary incontinence     Urgency  . Anxiety state, unspecified   . Palpitations   . Allergic rhinitis, cause unspecified   . Other and unspecified hyperlipidemia   . Unspecified hypothyroidism   . Edema   . Insomnia, unspecified   . Depressive disorder, not elsewhere classified   . Unspecified urinary incontinence   . Peptic ulcer, unspecified site, unspecified as acute or chronic, without mention of hemorrhage, perforation, or obstruction   . Disturbance of skin sensation   . Calculus of kidney   . Facial spasm 04/05/2014  . Abnormal brain MRI 04/13/2014  . 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 Monilial vaginitis 10/23/2014    Past Surgical History  Procedure Laterality Date  . Tonsillectomy and adenoidectomy  1963  . Dilation and curettage of uterus      3-05  . Hysteroscopy      3-05  . Endometrial biopsy      Dr.Gottsegen   . Tongue biopsy       There were no vitals filed for this visit.  Visit Diagnosis:  Pain in joint, shoulder region, right  Weakness of right arm  Cognitive deficits      Subjective Assessment - 11/12/14 0940    Subjective  I can't sleep with all those pillows   Patient is accompained by: Family member  husband   Pertinent History glioblastoma with brain surgery to remove tumor 06/28/14, facial seizures   Patient Stated Goals To get my Rt shoulder and hand better b/c I'm Rt handed   Currently in Pain? Yes   Pain Score 4   at rest   Pain Location Shoulder   Pain Orientation Right   Pain Descriptors / Indicators Sore   Pain Type Chronic pain   Pain Onset More than a month ago   Pain Frequency Intermittent   Aggravating Factors  movement of arm and positioning   Pain Relieving Factors ice, heat, and OT treamtents   Multiple Pain Sites No                      OT Treatments/Exercises (OP) - 11/12/14 0001    Neurological Re-education Exercises   Other Exercises 1 Neuro re ed initially in supine with emphasis on elbow flexion/extension with intermittent resistance  to address proprioception and graded control, horizontal abduction and ER. Pt then progressed to chest presses and overhead shoulder flexion with facilitation to reduce synergy influence. Today pt able to achieve 125* shoulder flexion in supine in closed chain activity with no pain.  In sitting also addressed mid reach with arm in weightbearing and with added intermittent resistance with emphasis on control and grading of  movement.  Pt with apraxia and signficant sensory deficits. Pt taught to do assisted functional reach with RUE to 80* as long as she has no pain. Husband present and verbalized understanding. Husband to monitor and cue pt prn.   Moist Heat Therapy   Number Minutes Moist Heat 8 Minutes  simultaneously with neuro re ed to RUE   Moist Heat Location Shoulder                  OT Short Term Goals -  11/12/14 1246    OT SHORT TERM GOAL #1   Title Independent with HEP for Rt hand coordination, RUE ROM and Rt hand strength (DUE 11/08/14)   Time 4   Period Weeks   Status Not Met  Pt has had severe pain in R shoulder therefore has not met any STG's as treatment has needed to focus on addressing pain   OT SHORT TERM GOAL #2   Title Pt to verbalize understanding with pain management strategies RUE   Time 4   Period Weeks   Status Partially Met  able to verbalize some positioning and movement strategies   OT SHORT TERM GOAL #3   Title Pt to write name at 75% or greater legibility w/ A/E for pen prn Rt dominant hand   Baseline approx. 25% first name only with built up pen   Time 4   Period Weeks   Status Not Met   OT SHORT TERM GOAL #4   Title Improve RUE functional use as evidenced by performing 25 blocks or greater on Box & Blocks test   Baseline eval = 17   Time 4   Period Weeks   Status Not Met   OT SHORT TERM GOAL #5   Title Pt to improve grip strength Rt hand to 20 lbs or greater to decrease drops   Baseline eval = 10 lbs (Lt = 55 lbs)   Time 4   Period Weeks   Status Not Met   OT SHORT TERM GOAL #6   Title Pt to tie shoes and hook bra mod I level incorporating Rt dominant hand for assist   Baseline dependent   Time 4   Period Weeks   Status Not Met   OT SHORT TERM GOAL #7   Title Pt to use Rt dominant hand to feed Smelcer and groom Jafri 50% of the time    Baseline none with Rt hand, using Lt non dominant hand   Time 4   Period Weeks   Status Not Met           OT Long Term Goals - 11/12/14 1247    OT LONG TERM GOAL #1   Title Independent with updated HEP for RUE strength (DUE 12/08/14)   Time 8   Period Weeks   Status On-going   OT LONG TERM GOAL #2   Title Pt to perform high level reaching RUE to retrieve/replace 3 lb. object from high shelf 10 times w/o rest   Baseline only 75% shoulder flexion   Time 8   Period Weeks   Status  On-going   OT LONG TERM GOAL #3    Title Improve RUE functional use as evidenced by performing 40 blocks on Box & Blocks test    Baseline eval = 17   Time 8   Period Weeks   Status On-going   OT LONG TERM GOAL #4   Title Improve coordination as evidenced by performing 9 hole peg test in under 90 sec.    Baseline eval: only able to place 1 peg in 60 sec.    Time 8   Period Weeks   Status On-going   OT LONG TERM GOAL #5   Title Pt to improve grip strength Rt dominant hand to 30 lbs or greater to open jars/containers   Baseline eval = 10 lbs   Time 8   Period Weeks   Status On-going   OT LONG TERM GOAL #6   Title Pt to return to using Rt hand as dominant hand for all BADLS 16% of the time or greater   Baseline only using LUE   Time 8   Period Weeks   Status On-going   OT LONG TERM GOAL #7   Title Pt to write short paragraph with Rt dominant hand using A/E prn with 90% legibility    Baseline 25% legibility for first name only   Time 8   Period Weeks   Status On-going   OT LONG TERM GOAL #8   Title Pt to return to light cooking/cleaning tasks mod I level with A/E and/or DME prn   Baseline dependent   Time 8   Period Weeks   Status On-going               Plan - 11/12/14 1247    Clinical Impression Statement Pt with significant reduction in pain today both at rest and with movement. Pt continues to require max cues and reminders about safe way to use RUE and positioning. At end of session pt reported 0/10 pain at rest and 0/10 pain with therapy activities. Pt does continue to experience pain with open ended functional task with low to mid reach   Pt will benefit from skilled therapeutic intervention in order to improve on the following deficits (Retired) Decreased coordination;Decreased range of motion;Difficulty walking;Decreased safety awareness;Decreased endurance;Increased edema;Impaired sensation;Decreased activity tolerance;Decreased knowledge of precautions;Impaired tone;Decreased balance;Decreased  knowledge of use of DME;Impaired UE functional use;Pain;Decreased cognition;Decreased mobility;Decreased strength   Rehab Potential Good   Clinical Impairments Affecting Rehab Potential Pt will benefit from skilled OT to address deficits (see above)   OT Frequency 2x / week   OT Duration 8 weeks   OT Treatment/Interventions [place]-care/ADL training;Fluidtherapy;Moist Heat;DME and/or AE instruction;Splinting;Patient/family education;Therapeutic exercises;Balance training;Therapeutic activities;Neuromuscular education;Functional Mobility Training;Passive range of motion;Cognitive remediation/compensation;Electrical Stimulation;Parrafin;Energy conservation;Manual Therapy   Plan address shoulder pain,neuro re ed   Consulted and Agree with Plan of Care Patient;Family member/caregiver   Family Member Consulted HUSBAND        Problem List Patient Active Problem List   Diagnosis Date Noted  . Monilial vaginitis 10/23/2014  . Deep vein thrombosis 08/12/2014  . History of inferior vena caval filter placement 07/14/2014  . Malignant glioma of brain 06/28/2014  . Diabetes mellitus, type 2 06/27/2014  . 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 Tremor of both hands 04/05/2014  . Vertigo 04/05/2014  . BPPV (benign paroxysmal positional vertigo) 03/14/2014  . Seizures 12/16/2013  . Epigastric pain 11/01/2013  . Fever blister 10/16/2013  . Unspecified hypothyroidism 12/13/2012  . Other and unspecified hyperlipidemia 12/13/2012  .  Anxiety state, unspecified 12/13/2012  . Edema 12/13/2012  . Anxiety state 12/13/2012  . Adult hypothyroidism 12/13/2012  . Type II or unspecified type diabetes mellitus without mention of complication, not stated as uncontrolled   . Urinary incontinence   . DEPRESSION 09/24/2007  . Essential hypertension 09/24/2007  . GERD 09/24/2007  . HIATAL HERNIA 09/24/2007  . 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 Headache(784.0) 09/24/2007    Quay Burow,  OTR/L 11/12/2014, 12:51 PM  [facility] [address] [place], [place], [address] Phone: [phone]   Fax:  [phone]

## 2014-11-12 NOTE — Therapy (Signed)
[facility] [address] [place], [place], [address] Phone: [phone]   Fax:  [phone]  Physical Therapy Treatment  Patient Details  Name: Kelly Fletcher MRN: [id number] Date of Birth: [birth year] Referring Provider:  Estill Dooms, MD  Encounter Date: 11/12/2014      PT End of Session - 11/12/14 1640    Visit Number 6   Number of Visits 17   Date for PT Re-Evaluation 12/10/14   PT Start Time 1150   PT Stop Time 2229   PT Time Calculation (min) 55 min      Past Medical History  Diagnosis Date  . Uterine prolapse   . Cystocele   . GERD (gastroesophageal reflux disease)   . Elevated cholesterol   . Rectocele   . Hypertension   . Arthritis   . Type II or unspecified type diabetes mellitus without mention of complication, uncontrolled     Type 2  . Urinary incontinence     Urgency  . Anxiety state, unspecified   . Palpitations   . Allergic rhinitis, cause unspecified   . Other and unspecified hyperlipidemia   . Unspecified hypothyroidism   . Edema   . Insomnia, unspecified   . Depressive disorder, not elsewhere classified   . Unspecified urinary incontinence   . Peptic ulcer, unspecified site, unspecified as acute or chronic, without mention of hemorrhage, perforation, or obstruction   . Disturbance of skin sensation   . Calculus of kidney   . Facial spasm 04/05/2014  . Abnormal brain MRI 04/13/2014  . 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 Monilial vaginitis 10/23/2014    Past Surgical History  Procedure Laterality Date  . Tonsillectomy and adenoidectomy  1963  . Dilation and curettage of uterus      3-05  . Hysteroscopy      3-05  . Endometrial biopsy      Dr.Gottsegen   . Tongue biopsy      There were no vitals filed for this visit.  Visit Diagnosis:  Abnormality of gait  Balance problems  Right leg weakness       Subjective Assessment - 11/12/14 1309    Currently in Pain? Yes   Pain Score 4    Pain Location Shoulder   Pain Orientation Right   Pain Descriptors / Indicators Sore   Pain Type Chronic pain   Pain Onset More than a month ago   Pain Frequency Intermittent   Aggravating Factors  certain movements of R arm   Pain Relieving Factors ice, heat   Multiple Pain Sites No                         [facility] Adult PT Treatment/Exercise - 11/12/14 1311    Knee/Hip Exercises: Aerobic   Stationary Bike Nustep level 5 x 8" with LE's only   Knee/Hip Exercises: Standing   Heel Raises 10 reps;1 set  R and LLE   Heel Raises Limitations needs UE support for single limb stance balance   Step Down Both;10 reps  with LUE support   Step Down Limitations decr. single limb stance on R & LLE   Functional Squat 20 reps  sidestepping 32' with green theraband around ankles   Other Standing Knee Exercises hip abduction bil. LE's in standing with green theraband   Knee/Hip Exercises: Sidelying   Hip  ABduction AROM;1 set  10 reps with assist. to keep RLE in neutral position   Clams RLE with 5# weight with 3 sec hold x 10 reps     Neuro Re-ed:  Standing on blue foam with head turns inside bars - with UE support prn but not used; marching in place On Airex with SBA ; tapping down to floor with LLE for R single limb stance Alternate stepping on incline with CGA to min assist - forward and back x 10 reps each LE Rolling blue ball up/back x 10 reps each with RLE and then with LLE for improved single limb stance and coordination with RLE and LLE  TherEx; sidestepping with green theraband for R hip abduction strengthening x 30' x 1 rep; hip flexion - big step forward With green band around ankles 30' x 1; backward with wide BOS 3 reps 30' with CGA needed due to LOB        PT Education - 11/12/14 1639    Education provided Yes   Education Details added hip abduction in sidelying and clam  shell in sidelying - recommend doing on both legs due to weakness also noted in LLE   Person(s) Educated Patient   Methods Explanation;Demonstration;Handout   Comprehension Verbalized understanding;Returned demonstration          PT Short Term Goals - 11/12/14 1152    PT SHORT TERM GOAL #1   Title             PT Long Term Goals - 10/10/14 1354    PT LONG TERM GOAL #1   Title demonstrates / verbalizes understanding of ongoing HEP / fitness plan. (Target Date: 12/07/2014)   Time 8   Period Weeks   Status New   PT LONG TERM GOAL #2   Title Cognitive Timed Up & Go <15 sec safely  (Target Date: 12/07/2014)   Time 8   Period Weeks   Status New   PT LONG TERM GOAL #3   Title Berg Balance >/=52/56  (Target Date: 12/07/2014)   Time 8   Period Weeks   Status New   PT LONG TERM GOAL #4   Title Functional Gait Assessment >24/ 30  (Target Date: 12/07/2014)   Time 8   Period Weeks   Status New   PT LONG TERM GOAL #5   Title ambulates >1000' without device including grass, ramps, curbs, stairs indpendently.  (Target Date: 12/07/2014)   Time 8   Period Weeks   Status New               Plan - 11/12/14 1641    Clinical Impression Statement Pt. noted to have weakness in R hip abductors - MMT grade 3+/5 with pt having difficulty maintaining RLE in neutral position without hip flexion in performing abduction in sidelying position;  pt c/o fatigue after PT session today   Pt will benefit from skilled therapeutic intervention in order to improve on the following deficits Abnormal gait;Decreased activity tolerance;Decreased balance;Decreased endurance;Decreased strength   Rehab Potential Good   PT Frequency 2x / week   PT Duration 8 weeks   PT Treatment/Interventions ADLs/[facility];DME Instruction;Gait training;Stair training;Functional mobility training;Therapeutic activities;Therapeutic exercise;Balance training;Neuromuscular re-education;Patient/family education   PT  Next Visit Plan check new exs added to HEP; cont with strengthening and high level balance training   PT Home Exercise Plan hip abduction strengthening for bil. LE's   Consulted and Agree with Plan of Care Patient  Problem List Patient Active Problem List   Diagnosis Date Noted  . Monilial vaginitis 10/23/2014  . Deep vein thrombosis 08/12/2014  . History of inferior vena caval filter placement 07/14/2014  . Malignant glioma of brain 06/28/2014  . Diabetes mellitus, type 2 06/27/2014  . 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 Tremor of both hands 04/05/2014  . Vertigo 04/05/2014  . BPPV (benign paroxysmal positional vertigo) 03/14/2014  . Seizures 12/16/2013  . Epigastric pain 11/01/2013  . Fever blister 10/16/2013  . Unspecified hypothyroidism 12/13/2012  . Other and unspecified hyperlipidemia 12/13/2012  . Anxiety state, unspecified 12/13/2012  . Edema 12/13/2012  . Anxiety state 12/13/2012  . Adult hypothyroidism 12/13/2012  . Type II or unspecified type diabetes mellitus without mention of complication, not stated as uncontrolled   . Urinary incontinence   . DEPRESSION 09/24/2007  . Essential hypertension 09/24/2007  . GERD 09/24/2007  . HIATAL HERNIA 09/24/2007  . 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 Headache(784.0) 09/24/2007    Alda Lea, PT 11/12/2014, 4:51 PM  [facility] [address] [place], [place], [address] Phone: [phone]   Fax:  [phone]

## 2014-11-12 NOTE — Patient Instructions (Addendum)
Abduction: Clam (Eccentric) - Side-Lying   Lie on side with knees bent. Lift top knee, keeping feet together. Keep trunk steady. Slowly lower for 3-5 seconds. _10__ reps per set, _1-2__ sets per day, ___ days per week. Add ___ lbs when you achieve ___ repetitions.  Copyright  VHI. All rights reserved.  Abduction   Lift leg up toward ceiling. Return. Use ___NO weight_ lbs on ankle. Repeat __10__ times each leg. Do __1-2__ sessions per day.  http://gt2.exer.us/386   Copyright  VHI. All rights reserved.

## 2014-11-13 ENCOUNTER — Ambulatory Visit (HOSPITAL_BASED_OUTPATIENT_CLINIC_OR_DEPARTMENT_OTHER): Payer: Medicare Other | Admitting: Oncology

## 2014-11-13 ENCOUNTER — Ambulatory Visit: Payer: Medicare Other

## 2014-11-13 ENCOUNTER — Encounter: Payer: Self-pay | Admitting: Oncology

## 2014-11-13 ENCOUNTER — Telehealth: Payer: Self-pay | Admitting: Oncology

## 2014-11-13 VITALS — BP 160/71 | HR 76 | Temp 98.7°F | Resp 18 | Ht 69.0 in | Wt 167.1 lb

## 2014-11-13 DIAGNOSIS — I10 Essential (primary) hypertension: Secondary | ICD-10-CM

## 2014-11-13 DIAGNOSIS — R4701 Aphasia: Secondary | ICD-10-CM

## 2014-11-13 DIAGNOSIS — I82401 Acute embolism and thrombosis of unspecified deep veins of right lower extremity: Secondary | ICD-10-CM

## 2014-11-13 DIAGNOSIS — C719 Malignant neoplasm of brain, unspecified: Secondary | ICD-10-CM

## 2014-11-13 DIAGNOSIS — E119 Type 2 diabetes mellitus without complications: Secondary | ICD-10-CM

## 2014-11-13 DIAGNOSIS — R5383 Other fatigue: Secondary | ICD-10-CM

## 2014-11-13 DIAGNOSIS — M25511 Pain in right shoulder: Secondary | ICD-10-CM | POA: Diagnosis not present

## 2014-11-13 DIAGNOSIS — M79601 Pain in right arm: Secondary | ICD-10-CM

## 2014-11-13 DIAGNOSIS — C711 Malignant neoplasm of frontal lobe: Secondary | ICD-10-CM | POA: Diagnosis not present

## 2014-11-13 DIAGNOSIS — E785 Hyperlipidemia, unspecified: Secondary | ICD-10-CM

## 2014-11-13 NOTE — Telephone Encounter (Signed)
Gave and printed appt sched and avs fo rpt for June and July...the patient sched with Dr. Veverly Fells on 6.28 @ 4pm

## 2014-11-13 NOTE — Progress Notes (Signed)
[facility] New Patient Consult   Referring NI:[id number] Vlahovic  [name] 64 y.o.  1951-01-24    Reason for Referral: Glioblastoma Multiforme   HPI: She developed intermittent episodes of right facial "twitching" beginning in July 2015. She was referred to Dr. Brett Fletcher and an MRI of the brain on 04/07/2014 revealed 2 nodular enhancing left posterior frontal subcortical lesions with surrounding edema. The lesions were felt to represent metastases versus less likely a subacute infarct. She underwent a staging PET scan 05/23/2014 revealed no evidence of a primary malignancy.  She was referred to Dr. Tommi Fletcher at [facility] and underwent gross total resection on 06/28/2014. The pathology revealed a glioblastoma (WHO grade 4). Additional evaluation found the tumor to have unmethylated MGMT, negative IDH1 and IDH 2 mutations, positive EGFRvIII and TERT mutations. She completed adjuvant temozolomide and radiation beginning 08/06/2014. An MRI 10/04/2014 revealed no evidence of disease progression. Temozolomide was initiated on a 5 day schedule and she has completed 2 cycles to date. The second cycle began 11/05/2014 with dose escalation of the temozolomide.  She reports tolerating the temozolomide well. No nausea, mouth sores, or diarrhea. She had a seizure in March 2016 and is followed by the neurology service at [facility]. No recurrent seizure. She was diagnosed with a DVT 07/13/2014 with placement of an IVC filter 07/14/2014. She reports no treatment with anticoagulation therapy.  Kelly Fletcher lives in [place] and would like to continue medical oncology care here. She is pertussis dissipating in a physical therapy program.  Past Medical History  Diagnosis Date  . Uterine prolapse   . Cystocele   . GERD (gastroesophageal reflux disease)   . Elevated cholesterol   . Rectocele   . Hypertension   . Arthritis   . Type II or unspecified type diabetes mellitus without mention of  complication, uncontrolled     Type 2  . Urinary incontinence     Urgency  . Anxiety state, unspecified   . Palpitations   . Allergic rhinitis, cause unspecified   . Other and unspecified hyperlipidemia   . Unspecified hypothyroidism   .  Glioblastoma Multiforme  06/28/2014   .  G1 P1    . Depressive disorder, not elsewhere classified   . inence   . Peptic ulcer, unspecified site, unspecified as acute or chronic, without mention of hemorrhage, perforation, or obstruction   .    Marland Kitchen Calculus of kidney   . Facial spasm 04/05/2014  . Abnormal brain MRI 04/13/2014  . 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 Monilial vaginitis 10/23/2014    .   Right leg DVT                                                                                                   07/13/2014  Past Surgical History  Procedure Laterality Date  . Tonsillectomy and adenoidectomy  1963  . Dilation and curettage of uterus      3-05  . Hysteroscopy      3-05  .  Endometrial biopsy      Dr.Gottsegen   . Tongue biopsy     .   Craniotomy and resection of left frontal brain tumor 06/28/2014  .   Placement of IVC filter 07/14/2014  Medications: Reviewed  Allergies:  Allergies  Allergen Reactions  . Shellfish Allergy Anaphylaxis  . Ibuprofen   . Latex     Skin rash  . Naproxen   . Nitrofurantoin Monohyd Macro   . Paroxetine Hcl   . Prednisone   . Prozac [Fluoxetine Hcl]   . Tegretol [Carbamazepine] Hives    Family history: A brother had a glioblastoma. Another brother had ocular melanoma. Her mother had "cancer ". She had 8 siblings. No other family history of cancer.  Social History:   She lives with her husband and [place]. She does not use tobacco or alcohol. No transfusion history. No risk factor for HIV or hepatitis.  History  Alcohol Use No    History  Smoking status  . Never Smoker   Smokeless tobacco  . Never Used       ROS:   Positives include: Anorexia, constipation when taking Zofran, seizure in March 2016, right arm and leg weakness, expressive aphasia, vaginal yeast infection improved with Diflucan, pain at the right upper arm and shoulder  A complete ROS was otherwise negative.  Physical Exam:  Blood pressure 160/71, pulse 76, temperature 98.7 F (37.1 C), temperature source Oral, resp. rate 18, height _0  (1.753 m), weight 167 lb 1.6 oz (75.796 kg), SpO2 99 %.  HEENT: Oropharynx without visible mass, no thrush, neck without mass Lungs: Clear bilaterally Cardiac: Regular rate and rhythm Abdomen: No hepatosplenomegaly  Vascular: No leg edema Lymph nodes: No cervical, supraclavicular, or axillary nodes Neurologic: Alert and oriented, partial expressive aphasia, right arm and hand weakness Skin: No rash Musculoskeletal: Pain with motion at the right shoulder, right arm without mass   LAB:  CBC  Lab Results  Component Value Date   WBC 6.6 05/04/2014   HGB 14.3 05/04/2014   HCT 42.5 05/04/2014   MCV 93 05/04/2014   PLT 191 12/16/2013   NEUTROABS 4.6 05/04/2014     CMP      Component Value Date/Time   NA 141 05/04/2014 1059   NA 141 12/16/2013 1651   K 4.2 05/04/2014 1059   CL 101 05/04/2014 1059   CO2 24 05/04/2014 1059   GLUCOSE 105* 05/04/2014 1059   GLUCOSE 135* 12/16/2013 1651   BUN 17 05/04/2014 1059   BUN 21 12/16/2013 1651   CREATININE 0.80 05/04/2014 1059   CALCIUM 9.0 05/04/2014 1059   PROT 6.7 05/04/2014 1059   AST 21 05/04/2014 1059   ALT 34* 05/04/2014 1059   ALKPHOS 59 05/04/2014 1059   BILITOT 0.2 05/04/2014 1059   GFRNONAA 78 05/04/2014 1059   GFRAA 89 05/04/2014 1059     Imaging:  As per history of present illness   Assessment/Plan:   1. 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  MGMT unmethylated  IDH1 and IDH2 negative  TERT positive  Initiation of radiation and concurrent  temozolomide, 42 days, 08/06/2014  Cycle 1 Adjuvant 5 day temozolomide 10/04/2014  2.   History of seizures secondary to #1  3.   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   Diabetes  6.   Hypertension  7.   Hyperlipidemia  8.   Pain at the right upper arm/shoulder    Disposition:   Kelly Fletcher was diagnosed with a left frontal glioblastoma after presenting with partial seizures. She underwent gross total resection and is completing adjuvant therapy. She developed a right hemiplegia following surgery and has noted improvement with a physical therapy program.  She has completed 2 cycles of adjuvant temozolomide. She is tolerating the temozolomide well. She will follow up at [facility] on 11/29/2014 for an MRI prior to cycle 3. She plans to have the temozolomide prescribed by the [facility] physicians for cycle 3. I will see her prior to cycle 4.  The right arm/shoulder discomfort may be related to adhesive capsulitis with limited mobility of the right arm following surgery. We will make an with the pediatric referral.  She will discuss the indication for anticoagulation therapy and removal of the IVC filter with the [facility] brain tumor team.  Betsy Coder 11/13/2014, 2:35 PM

## 2014-11-14 ENCOUNTER — Ambulatory Visit: Payer: Medicare Other | Admitting: Physical Therapy

## 2014-11-14 ENCOUNTER — Telehealth: Payer: Self-pay | Admitting: Oncology

## 2014-11-14 ENCOUNTER — Ambulatory Visit: Payer: Medicare Other

## 2014-11-14 ENCOUNTER — Ambulatory Visit: Payer: Medicare Other | Admitting: Occupational Therapy

## 2014-11-14 NOTE — Telephone Encounter (Signed)
Pt's spouse called lft msg confirming MD visit for 07/28, called pt back and confirmed labs/ov D/T.... Cherylann Banas

## 2014-11-15 ENCOUNTER — Encounter: Payer: Medicare Other | Admitting: Speech Pathology

## 2014-11-15 ENCOUNTER — Ambulatory Visit: Payer: Medicare Other | Admitting: Physical Therapy

## 2014-11-15 ENCOUNTER — Ambulatory Visit: Payer: Medicare Other | Admitting: Occupational Therapy

## 2014-11-15 ENCOUNTER — Encounter: Payer: Self-pay | Admitting: Occupational Therapy

## 2014-11-15 DIAGNOSIS — R609 Edema, unspecified: Secondary | ICD-10-CM

## 2014-11-15 DIAGNOSIS — R279 Unspecified lack of coordination: Secondary | ICD-10-CM

## 2014-11-15 DIAGNOSIS — R29818 Other symptoms and signs involving the nervous system: Secondary | ICD-10-CM | POA: Diagnosis not present

## 2014-11-15 DIAGNOSIS — R29898 Other symptoms and signs involving the musculoskeletal system: Secondary | ICD-10-CM

## 2014-11-15 DIAGNOSIS — R2689 Other abnormalities of gait and mobility: Secondary | ICD-10-CM

## 2014-11-15 DIAGNOSIS — R4189 Other symptoms and signs involving cognitive functions and awareness: Secondary | ICD-10-CM

## 2014-11-15 DIAGNOSIS — R269 Unspecified abnormalities of gait and mobility: Secondary | ICD-10-CM

## 2014-11-15 NOTE — Therapy (Signed)
[facility] [address] [place], [place], [address] Phone: [phone]   Fax:  [phone]  Physical Therapy Treatment  Patient Details  Name: Kelly Fletcher MRN: [id number] Date of Birth: [birth year] Referring Provider:  Estill Dooms, MD  Encounter Date: 11/15/2014      PT End of Session - 11/15/14 1022    Visit Number 7   Number of Visits 17   Date for PT Re-Evaluation 12/10/14   Authorization Type G-code & KX modifier   PT Start Time 0930   PT Stop Time 1015   PT Time Calculation (min) 45 min   Equipment Utilized During Treatment Gait belt   Activity Tolerance Patient tolerated treatment well   Behavior During Therapy [facility] for tasks assessed/performed      Past Medical History  Diagnosis Date  . Uterine prolapse   . Cystocele   . GERD (gastroesophageal reflux disease)   . Elevated cholesterol   . Rectocele   . Hypertension   . Arthritis   . Type II or unspecified type diabetes mellitus without mention of complication, uncontrolled     Type 2  . Urinary incontinence     Urgency  . Anxiety state, unspecified   . Palpitations   . Allergic rhinitis, cause unspecified   . Other and unspecified hyperlipidemia   . Unspecified hypothyroidism   . Edema   . Insomnia, unspecified   . Depressive disorder, not elsewhere classified   . Unspecified urinary incontinence   . Peptic ulcer, unspecified site, unspecified as acute or chronic, without mention of hemorrhage, perforation, or obstruction   . Disturbance of skin sensation   . Calculus of kidney   . Facial spasm 04/05/2014  . Abnormal brain MRI 04/13/2014  . 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 Monilial vaginitis 10/23/2014    Past Surgical History  Procedure Laterality Date  . Tonsillectomy and adenoidectomy  1963  . Dilation and curettage of uterus      3-05  . Hysteroscopy      3-05  . Endometrial biopsy      Dr.Gottsegen   . Tongue biopsy      There were no vitals filed for this visit.  Visit Diagnosis:  No diagnosis found.      Subjective Assessment - 11/15/14 0937    Subjective pt reports she has not completed new additions to HEP (sidelying clamshells) and wishes to review it to day. pt reprots she still feels an inward rotation of RLE. pt reports she is getting a new MRI on the 30th of June and she is happy with her new neurologist/oncologist in [place].    Patient Stated Goals I want to walk better especially stairs, endurance, multi-task like talk & walk   Currently in Pain? Yes   Pain Score 4    Pain Location Shoulder   Pain Orientation Right   Pain Descriptors / Indicators Sore   Pain Type Chronic pain       Treatment   Therapeutic Exercise:  -side lying clamshells with cues for appropriate positioning and technique. 2 sets x 10 reps.  -side lying reverse clamshells (knees together, raising ankles towards ceiling) with cues for appropriate positioning and technique. 2 sets x 10 reps. -side stepping x 30 feet with squats between steps. Pt cued to keep ankles in neutral position and trunk upright. 3 reps.  -Scifit x 8  minutes for strengthening, activity tolerance and endurance. Level 4 resistance.   Neuro Re-education:  -Coordination exercise: Marching in place on physioball with CGA as needed. Pt cued to alternate flexing hip fully while with good posture and maintaining balance.  -Tandem walking at parallel bars on foam beam. Cognitive tasks incorporated to challenge pt with multitasking.  -Lateral walking at parallel bars on foam beam. tasks incorporated to challenge pt with multitasking. -SLS at parallel bars with rocker board under LLE to promote weight shifting to R. Pt cued to move rocker board in anterior/posterior and lateral directions.  -Heel dips from stepping stool in front of mirror for visual feedback. Pt cued to keep knees in  neutral position (as they tend to collapse/ internally rotate) when lowering heel to ground while maintaining dorsiflexion at ankle. 20 reps bilaterally. Exercise then repeated on stairs for variability x 10 reps.   Gait Training:  -Backwards gait with no AD and CGA . 30 feet x 2 reps with cues for step length and posture.  -amb x 250 feet without AD and SBA. Cues for arm swing, step length and posture. Pt practiced negotiating obstacles and turns during gait training.           PT Education - 11/15/14 1019    Education provided Yes   Education Details Reviewed clamshell in sidelying and reverse clamshells instructed to patient for HEP as well.     Person(s) Educated Patient   Methods Explanation;Demonstration   Comprehension Verbalized understanding;Returned demonstration          PT Short Term Goals - 11/12/14 1152    PT SHORT TERM GOAL #1   Title             PT Long Term Goals - 10/10/14 1354    PT LONG TERM GOAL #1   Title demonstrates / verbalizes understanding of ongoing HEP / fitness plan. (Target Date: 12/07/2014)   Time 8   Period Weeks   Status New   PT LONG TERM GOAL #2   Title Cognitive Timed Up & Go <15 sec safely  (Target Date: 12/07/2014)   Time 8   Period Weeks   Status New   PT LONG TERM GOAL #3   Title Berg Balance >/=52/56  (Target Date: 12/07/2014)   Time 8   Period Weeks   Status New   PT LONG TERM GOAL #4   Title Functional Gait Assessment >24/ 30  (Target Date: 12/07/2014)   Time 8   Period Weeks   Status New   PT LONG TERM GOAL #5   Title ambulates >1000' without device including grass, ramps, curbs, stairs indpendently.  (Target Date: 12/07/2014)   Time 8   Period Weeks   Status New            Plan - 11/15/14 1329    Clinical Impression Statement pt continues to demonstrate weakness in R hip aductors and much of treatment today focused on hip strengthening and maintaining R LE in neutral postion. HEP was reviewed with patient today and  she was able to demonstrate exercises back for understanding. Good repsose today with hip strengthening exercise (heel dips from platform) with cues for engagemnet of doriflexors (as pt report she tends to "catch" R LE when ambulating. pt is making progress towards goals.   Pt will benefit from skilled therapeutic intervention in order to improve on the following deficits Abnormal gait;Decreased activity tolerance;Decreased balance;Decreased endurance;Decreased strength   Rehab Potential Good   PT Frequency  2x / week   PT Duration 8 weeks   PT Treatment/Interventions ADLs/[facility];DME Instruction;Gait training;Stair training;Functional mobility training;Therapeutic activities;Therapeutic exercise;Balance training;Neuromuscular re-education;Patient/family education   PT Next Visit Plan check new exs added to HEP; cont with strengthening and high level balance training   PT Home Exercise Plan hip abduction strengthening for bil. LE's   Consulted and Agree with Plan of Care Patient        Problem List Patient Active Problem List   Diagnosis Date Noted  . Monilial vaginitis 10/23/2014  . Deep vein thrombosis 08/12/2014  . History of inferior vena caval filter placement 07/14/2014  . Malignant glioma of brain 06/28/2014  . Diabetes mellitus, type 2 06/27/2014  . 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 Tremor of both hands 04/05/2014  . Vertigo 04/05/2014  . BPPV (benign paroxysmal positional vertigo) 03/14/2014  . Seizures 12/16/2013  . Epigastric pain 11/01/2013  . Fever blister 10/16/2013  . Unspecified hypothyroidism 12/13/2012  . Other and unspecified hyperlipidemia 12/13/2012  . Anxiety state, unspecified 12/13/2012  . Edema 12/13/2012  . Anxiety state 12/13/2012  . Adult hypothyroidism 12/13/2012  . Type II or unspecified type diabetes mellitus without mention of complication, not stated as uncontrolled   . Urinary incontinence   . DEPRESSION 09/24/2007  .  Essential hypertension 09/24/2007  . GERD 09/24/2007  . HIATAL HERNIA 09/24/2007  . 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 Headache(784.0) 09/24/2007   Fanny Dance, Student-PT    Fanny Dance 11/15/2014, 3:08 PM  [facility] [address] [place], [place], [address] Phone: [phone]   Fax:  [phone]

## 2014-11-15 NOTE — Patient Instructions (Signed)
Swelling in your left hand:  1. Do edema massage 2 times a day every day.    2.  You have been given a compression glove to wear to help with the swelling in your hand:  You will want to wear it at night when you sleep and if you take a rest in the afternoon.  You MUST still elevate your hand when you wear it (hand higher than elbow).  You can wash it out in the sink with mild detergent and let it air dry.    3.  Keep your hand as active as possible. There are pumps in your hand that activate when you open and close your hand.  Make sure you do full opening as well as tight closing with your hand.  Do frequently throughout the day.

## 2014-11-15 NOTE — Therapy (Signed)
[facility] [address] [place], [place], [address] Phone: [phone]   Fax:  [phone]  Occupational Therapy Treatment  Patient Details  Name: Kelly Fletcher MRN: [id number] Date of Birth: 1950/06/13 Referring Provider:  Estill Dooms, MD  Encounter Date: 11/15/2014      OT End of Session - 11/15/14 1226    Visit Number 8   Number of Visits 17   Date for OT Re-Evaluation 12/08/14   Authorization Type MCR - G code needed   Authorization Time Period 60 Days   OT Start Time 1016   OT Stop Time 1059   OT Time Calculation (min) 43 min   Activity Tolerance Patient tolerated treatment well      Past Medical History  Diagnosis Date  . Uterine prolapse   . Cystocele   . GERD (gastroesophageal reflux disease)   . Elevated cholesterol   . Rectocele   . Hypertension   . Arthritis   . Type II or unspecified type diabetes mellitus without mention of complication, uncontrolled     Type 2  . Urinary incontinence     Urgency  . Anxiety state, unspecified   . Palpitations   . Allergic rhinitis, cause unspecified   . Other and unspecified hyperlipidemia   . Unspecified hypothyroidism   . Edema   . Insomnia, unspecified   . Depressive disorder, not elsewhere classified   . Unspecified urinary incontinence   . Peptic ulcer, unspecified site, unspecified as acute or chronic, without mention of hemorrhage, perforation, or obstruction   . Disturbance of skin sensation   . Calculus of kidney   . Facial spasm 04/05/2014  . Abnormal brain MRI 04/13/2014  . 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 Monilial vaginitis 10/23/2014    Past Surgical History  Procedure Laterality Date  . Tonsillectomy and adenoidectomy  1963  . Dilation and curettage of uterus      3-05  . Hysteroscopy      3-05  . Endometrial biopsy      Dr.Gottsegen   . Tongue biopsy       There were no vitals filed for this visit.  Visit Diagnosis:  Weakness of right arm  Cognitive deficits  Lack of coordination  Edema  Weakness of right hand      Subjective Assessment - 11/15/14 1020    Subjective  My arm is so much better - I am sleeping at night and my pain is so  much better.   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 No/denies                      OT Treatments/Exercises (OP) - 11/15/14 0001    Neurological Re-education Exercises   Other Exercises 1 Neuro re ed to address low to mid functional reach with min facilitation. Pt able to do low reach with no pain and mid level assisted reach (pt using left hand to assist weight of RUE) to 100* with no pain at all. After edema mgmt techniques pt able to incorporate grasp and release into reaching tasks with greater ease. Pt requires facilitation to decrease IR and elbow flexion with all  reaching tasks.    Manual Therapy   Manual Therapy Edema management   Manual therapy comments Pt with sigificant swelling in hand today. Edema massage with excellent results;  instructed pt and husband again in technique and strongly encouraged pt to do consistently 2x/day. Pt and husband able to return demonstrate and verbalize understanding. Pt also issues compression glove - see pt instructions for details.                  OT Education - 11/15/14 1222    Education provided Yes   Education Details edema mgmt   Person(s) Educated Patient;Spouse   Methods Explanation;Demonstration;Tactile cues;Verbal cues;Handout   Comprehension Verbalized understanding;Returned demonstration          OT Short Term Goals - 11/15/14 1223    OT SHORT TERM GOAL #1   Title Independent with HEP for Rt hand coordination, RUE ROM  and Rt hand strength (DUE 11/08/14)   Time 4   Period Weeks   Status Not Met  Pt has had severe pain in R shoulder therefore has not met any STG's as treatment has needed to focus on addressing pain   OT SHORT TERM GOAL #2   Title Pt to verbalize understanding with pain management strategies RUE   Time 4   Period Weeks   Status Partially Met  able to verbalize some positioning and movement strategies   OT SHORT TERM GOAL #3   Title Pt to write name at 75% or greater legibility w/ A/E for pen prn Rt dominant hand   Baseline approx. 25% first name only with built up pen   Time 4   Period Weeks   Status Not Met   OT SHORT TERM GOAL #4   Title Improve RUE functional use as evidenced by performing 25 blocks or greater on Box & Blocks test   Baseline eval = 17   Time 4   Period Weeks   Status Not Met   OT SHORT TERM GOAL #5   Title Pt to improve grip strength Rt hand to 20 lbs or greater to decrease drops   Baseline eval = 10 lbs (Lt = 55 lbs)   Time 4   Period Weeks   Status Not Met   OT SHORT TERM GOAL #6   Title Pt to tie shoes and hook bra mod I level incorporating Rt dominant hand for assist   Baseline dependent   Time 4   Period Weeks   Status Not Met   OT SHORT TERM GOAL #7   Title Pt to use Rt dominant hand to feed [place] and groom [place]omen 50% of the time    Baseline none with Rt hand, using Lt non dominant hand   Time 4   Period Weeks   Status Not Met           OT Long Term Goals - 11/15/14 1223    OT LONG TERM GOAL #1   Title Independent with updated HEP for RUE strength (DUE 12/08/14)   Time 8   Period Weeks   Status On-going   OT LONG TERM GOAL #2   Title Pt to perform high level reaching RUE to retrieve/replace 3 lb. object from high shelf 10 times w/o rest   Baseline only 75% shoulder flexion   Time 8   Period Weeks   Status On-going   OT LONG TERM GOAL #3   Title Improve RUE functional use as evidenced by performing 40 blocks on Box &  Blocks test     Baseline eval = 17   Time 8   Period Weeks   Status On-going   OT LONG TERM GOAL #4   Title Improve coordination as evidenced by performing 9 hole peg test in under 90 sec.    Baseline eval: only able to place 1 peg in 60 sec.    Time 8   Period Weeks   Status On-going   OT LONG TERM GOAL #5   Title Pt to improve grip strength Rt dominant hand to 30 lbs or greater to open jars/containers   Baseline eval = 10 lbs   Time 8   Period Weeks   Status On-going   OT LONG TERM GOAL #6   Title Pt to return to using Rt hand as dominant hand for all BADLS 26% of the time or greater   Baseline only using LUE   Time 8   Period Weeks   Status On-going   OT LONG TERM GOAL #7   Title Pt to write short paragraph with Rt dominant hand using A/E prn with 90% legibility    Baseline 25% legibility for first name only   Time 8   Period Weeks   Status On-going   OT LONG TERM GOAL #8   Title Pt to return to light cooking/cleaning tasks mod I level with A/E and/or DME prn   Baseline dependent   Time 8   Period Weeks   Status On-going               Plan - 11/15/14 1223    Clinical Impression Statement Pt has made excellent progress in terms of pain reduction in shoulder/arm (0/10 today even with active reach - assisted reach beyond low reach). Pt able to use RUE in low reach activities at home and able to incoporate assisted reach into tasks at home.  Pt needs encouragement and monitoring to avoid movements that increase R shoulder pain - husband able to monitor   Pt will benefit from skilled therapeutic intervention in order to improve on the following deficits (Retired) Decreased coordination;Decreased range of motion;Difficulty walking;Decreased safety awareness;Decreased endurance;Increased edema;Impaired sensation;Decreased activity tolerance;Decreased knowledge of precautions;Impaired tone;Decreased balance;Decreased knowledge of use of DME;Impaired UE functional use;Pain;Decreased  cognition;Decreased mobility;Decreased strength   Rehab Potential Good   Clinical Impairments Affecting Rehab Potential Pt will benefit from skilled OT to address deficits (see above)   OT Frequency 2x / week   OT Duration 8 weeks   OT Treatment/Interventions Canty-care/ADL training;Fluidtherapy;Moist Heat;DME and/or AE instruction;Splinting;Patient/family education;Therapeutic exercises;Balance training;Therapeutic activities;Neuromuscular education;Functional Mobility Training;Passive range of motion;Cognitive remediation/compensation;Electrical Stimulation;Parrafin;Energy conservation;Manual Therapy   Plan monitor shoulder pain, neuro re ed for mid level reach, grasp and release   Consulted and Agree with Plan of Care Patient;Family member/caregiver   Family Member Consulted HUSBAND        Problem List Patient Active Problem List   Diagnosis Date Noted  . Monilial vaginitis 10/23/2014  . Deep vein thrombosis 08/12/2014  . History of inferior vena caval filter placement 07/14/2014  . Malignant glioma of brain 06/28/2014  . Diabetes mellitus, type 2 06/27/2014  . 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 Tremor of both hands 04/05/2014  . Vertigo 04/05/2014  . BPPV (benign paroxysmal positional vertigo) 03/14/2014  . Seizures 12/16/2013  . Epigastric pain 11/01/2013  . Fever blister 10/16/2013  . Unspecified hypothyroidism 12/13/2012  . Other and unspecified hyperlipidemia 12/13/2012  . Anxiety state, unspecified 12/13/2012  . Edema 12/13/2012  . Anxiety  state 12/13/2012  . Adult hypothyroidism 12/13/2012  . Type II or unspecified type diabetes mellitus without mention of complication, not stated as uncontrolled   . Urinary incontinence   . DEPRESSION 09/24/2007  . Essential hypertension 09/24/2007  . GERD 09/24/2007  . HIATAL HERNIA 09/24/2007  . 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 Headache(784.0) 09/24/2007    Quay Burow, OTR/L 11/15/2014, 12:28 PM  [facility] [address] [place], [place], [address] Phone: [phone]   Fax:  [phone]

## 2014-11-16 ENCOUNTER — Telehealth: Payer: Self-pay

## 2014-11-16 NOTE — Telephone Encounter (Signed)
Finished oral chemo temodar 380 daily PO on 6/6. Her blood work has not been checked. She is drinking liquids. Normal urination and bowel movement.  Both arms are tingling, it comes and goes. She is taking OT. Dizzy about midmorning today and again just recently and fell back into chair when getting up. Keppra, onfi and vimpat taken in AMs. She had rash on her neck for about 3 days comes and goes. She denies headache, she is forming words well.

## 2014-11-16 NOTE — Telephone Encounter (Signed)
S/w dr Benay Spice and called husband back. Instructed him to keep wife well hydrated, and resting. If condition does not improve or worsens to go to ER. Pt may use neosporin or OTC hydrocortisone cream on her rash. Husband spoke back instructions.

## 2014-11-19 ENCOUNTER — Ambulatory Visit: Payer: Medicare Other

## 2014-11-19 ENCOUNTER — Ambulatory Visit: Payer: Medicare Other | Admitting: Occupational Therapy

## 2014-11-19 ENCOUNTER — Telehealth: Payer: Self-pay | Admitting: *Deleted

## 2014-11-19 ENCOUNTER — Ambulatory Visit: Payer: Medicare Other | Admitting: Physical Therapy

## 2014-11-19 ENCOUNTER — Ambulatory Visit (HOSPITAL_BASED_OUTPATIENT_CLINIC_OR_DEPARTMENT_OTHER): Payer: Medicare Other

## 2014-11-19 ENCOUNTER — Other Ambulatory Visit: Payer: Self-pay | Admitting: *Deleted

## 2014-11-19 DIAGNOSIS — C711 Malignant neoplasm of frontal lobe: Secondary | ICD-10-CM | POA: Diagnosis present

## 2014-11-19 DIAGNOSIS — C719 Malignant neoplasm of brain, unspecified: Secondary | ICD-10-CM

## 2014-11-19 LAB — CBC WITH DIFFERENTIAL/PLATELET
BASO%: 0.2 % (ref 0.0–2.0)
Basophils Absolute: 0 10*3/uL (ref 0.0–0.1)
EOS%: 1.7 % (ref 0.0–7.0)
Eosinophils Absolute: 0.1 10*3/uL (ref 0.0–0.5)
HCT: 40.4 % (ref 34.8–46.6)
HGB: 13.7 g/dL (ref 11.6–15.9)
LYMPH#: 0.5 10*3/uL — AB (ref 0.9–3.3)
LYMPH%: 9 % — ABNORMAL LOW (ref 14.0–49.7)
MCH: 31.1 pg (ref 25.1–34.0)
MCHC: 33.9 g/dL (ref 31.5–36.0)
MCV: 91.8 fL (ref 79.5–101.0)
MONO#: 0.5 10*3/uL (ref 0.1–0.9)
MONO%: 9.3 % (ref 0.0–14.0)
NEUT%: 79.8 % — ABNORMAL HIGH (ref 38.4–76.8)
NEUTROS ABS: 4.6 10*3/uL (ref 1.5–6.5)
Platelets: 161 10*3/uL (ref 145–400)
RBC: 4.4 10*6/uL (ref 3.70–5.45)
RDW: 13.3 % (ref 11.2–14.5)
WBC: 5.8 10*3/uL (ref 3.9–10.3)

## 2014-11-19 LAB — COMPREHENSIVE METABOLIC PANEL (CC13)
ALBUMIN: 4 g/dL (ref 3.5–5.0)
ALT: 21 U/L (ref 0–55)
AST: 14 U/L (ref 5–34)
Alkaline Phosphatase: 52 U/L (ref 40–150)
Anion Gap: 8 mEq/L (ref 3–11)
BUN: 15.7 mg/dL (ref 7.0–26.0)
CHLORIDE: 105 meq/L (ref 98–109)
CO2: 27 mEq/L (ref 22–29)
Calcium: 9.3 mg/dL (ref 8.4–10.4)
Creatinine: 0.7 mg/dL (ref 0.6–1.1)
EGFR: 87 mL/min/{1.73_m2} — ABNORMAL LOW (ref >90)
Glucose: 117 mg/dl (ref 70–140)
POTASSIUM: 4 meq/L (ref 3.5–5.1)
Sodium: 141 mEq/L (ref 136–145)
TOTAL PROTEIN: 6 g/dL — AB (ref 6.4–8.3)
Total Bilirubin: 0.6 mg/dL (ref 0.20–1.20)

## 2014-11-19 NOTE — Telephone Encounter (Signed)
TEMODAR CYCLE WAS COMPLETED ON 11/06/14. PT. IS FEELING BETTER TODAY BUT WOULD LIKE LAB WORK DONE TODAY. SHE STARTS HER NEXT TEMODAR CYCLE ON 11/29/14 AT [facility]. VERBAL ORDER AND READ BACK TO DR.[place] A CBC AND CMET DRAWN TODAY. NOTIFIED PT.'S HUSBAND OF DR.SHERRILL'S ORDERS. PT. TO COME FOR LAB WORK AT 11:45AM TODAY. HER HUSBAND VOICES UNDERSTANDING.

## 2014-11-20 ENCOUNTER — Telehealth: Payer: Self-pay | Admitting: *Deleted

## 2014-11-20 NOTE — Telephone Encounter (Signed)
-----   Message from Ladell Pier, MD sent at 11/20/2014  7:34 AM EDT ----- Please call patient, cbc and chemistry panel are ok

## 2014-11-20 NOTE — Telephone Encounter (Signed)
Called pt to make her aware that CBC and CMET were normal; pt appreciated call and has all f/u appts

## 2014-11-21 ENCOUNTER — Encounter: Payer: Self-pay | Admitting: Physical Therapy

## 2014-11-21 ENCOUNTER — Ambulatory Visit: Payer: Medicare Other | Admitting: Occupational Therapy

## 2014-11-21 ENCOUNTER — Ambulatory Visit: Payer: Medicare Other

## 2014-11-21 ENCOUNTER — Ambulatory Visit: Payer: Medicare Other | Admitting: Physical Therapy

## 2014-11-21 DIAGNOSIS — R269 Unspecified abnormalities of gait and mobility: Secondary | ICD-10-CM

## 2014-11-21 DIAGNOSIS — R6889 Other general symptoms and signs: Secondary | ICD-10-CM

## 2014-11-21 DIAGNOSIS — R29898 Other symptoms and signs involving the musculoskeletal system: Secondary | ICD-10-CM

## 2014-11-21 DIAGNOSIS — R29818 Other symptoms and signs involving the nervous system: Secondary | ICD-10-CM | POA: Diagnosis not present

## 2014-11-21 DIAGNOSIS — R2689 Other abnormalities of gait and mobility: Secondary | ICD-10-CM

## 2014-11-21 DIAGNOSIS — R4701 Aphasia: Secondary | ICD-10-CM

## 2014-11-21 DIAGNOSIS — M25511 Pain in right shoulder: Secondary | ICD-10-CM

## 2014-11-21 NOTE — Patient Instructions (Signed)
  Please complete the assigned speech therapy homework prior to your next session, 15-20 minutes, twice a day.

## 2014-11-21 NOTE — Therapy (Signed)
[facility] [address] [place], [place], [address] Phone: [phone]   Fax:  [phone]  Speech Language Pathology Treatment  Patient Details  Name: Kelly Fletcher MRN: [id number] Date of Birth: 24-Jan-1951 Referring Provider:  Estill Dooms, MD  Encounter Date: 11/21/2014      End of Session - 11/21/14 1106    Visit Number 9   Number of Visits 16   Date for SLP Re-Evaluation 12/08/14   SLP Start Time 1017   SLP Stop Time  1101   SLP Time Calculation (min) 44 min   Activity Tolerance Patient tolerated treatment well      Past Medical History  Diagnosis Date  . Uterine prolapse   . Cystocele   . GERD (gastroesophageal reflux disease)   . Elevated cholesterol   . Rectocele   . Hypertension   . Arthritis   . Type II or unspecified type diabetes mellitus without mention of complication, uncontrolled     Type 2  . Urinary incontinence     Urgency  . Anxiety state, unspecified   . Palpitations   . Allergic rhinitis, cause unspecified   . Other and unspecified hyperlipidemia   . Unspecified hypothyroidism   . Edema   . Insomnia, unspecified   . Depressive disorder, not elsewhere classified   . Unspecified urinary incontinence   . Peptic ulcer, unspecified site, unspecified as acute or chronic, without mention of hemorrhage, perforation, or obstruction   . Disturbance of skin sensation   . Calculus of kidney   . Facial spasm 04/05/2014  . Abnormal brain MRI 04/13/2014  . 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 Monilial vaginitis 10/23/2014    Past Surgical History  Procedure Laterality Date  . Tonsillectomy and adenoidectomy  1963  . Dilation and curettage of uterus      3-05  . Hysteroscopy      3-05  . Endometrial biopsy      Dr.Gottsegen   . Tongue biopsy      There were no vitals filed for this visit.  Visit Diagnosis:  Aphasia      Subjective Assessment - 11/21/14 1033    Subjective Pt has been experiencing vertigo over the weekend. None today.               ADULT SLP TREATMENT - 11/21/14 1026    General Information   Behavior/Cognition Alert;Cooperative;Pleasant mood   Treatment Provided   Treatment provided Cognitive-Linquistic   Pain Assessment   Pain Assessment No/denies pain   Pain Score 4    Pain Location shouder   Pain Descriptors / Indicators Aching   Pain Intervention(s) Monitored during session   Cognitive-Linquistic Treatment   Treatment focused on Apraxia   Skilled Treatment In simple to mod-complex conversation pt was successful with fluid speech approx 85% of the time. SLP facilitated practice with speech compensations at the sentence level to habitualize fluency enhaincing strategies for incr'd speech fluidity - simlairities/differences: 75% success with using compensatory strategies. SLP req'd to provide cue for pt to provide similarity or difference instead of the other.    Assessment / Recommendations / Plan   Plan Continue with current plan of care   Progression Toward Goals   Progression toward goals Progressing toward goals            SLP Short Term Goals - 11/21/14 1235  SLP SHORT TERM GOAL #1   Title pt will complete HEP for motor speech planning with slow rate 95% success with rare verbal cues   Time 1   Period Weeks   Status Achieved   SLP SHORT TERM GOAL #2   Title pt will produce sentences using speech compensations with 85% fluency   Time 1   Status Achieved   SLP SHORT TERM GOAL #3   Title pt will tell SLP three compensations for improving motor planning for speech   Time 1   Status Achieved   SLP SHORT TERM GOAL #4   Title pt to participate in 5 minutes simple conversation with 85% fluency with modified independence   Time 1   Period Weeks   Status Achieved          SLP Long Term Goals - 11/21/14 1236    SLP LONG TERM GOAL #1   Title  pt will participate in 10 mintues mod complex conversation 90% fluency, with modified independence (compensations)   Status Achieved   SLP LONG TERM GOAL #2   Title pt will complete HEP with 90% fluency and modifed independence (compensations)   Time 3   Period Weeks   Status On-going   SLP LONG TERM GOAL #3   Title pt will participate in 7 minutes complex conversation with 85% fluency with modified independence   Time 3   Period Weeks   Status On-going          Plan - 11/21/14 1234    Clinical Impression Statement SLP is req'd to cont to provide cues for pt to use speech compensations in structured tasks as well as in conversation. SLP to discuss discharge with pt next 1-2 visits.   Speech Therapy Frequency 1x /week   Duration --  3 weeks   Treatment/Interventions Cueing hierarchy;Functional tasks;Patient/family education;Compensatory strategies;SLP instruction and feedback   Potential to Achieve Goals Fair        Problem List Patient Active Problem List   Diagnosis Date Noted  . Monilial vaginitis 10/23/2014  . Deep vein thrombosis 08/12/2014  . History of inferior vena caval filter placement 07/14/2014  . Malignant glioma of brain 06/28/2014  . Diabetes mellitus, type 2 06/27/2014  . 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 Tremor of both hands 04/05/2014  . Vertigo 04/05/2014  . BPPV (benign paroxysmal positional vertigo) 03/14/2014  . Seizures 12/16/2013  . Epigastric pain 11/01/2013  . Fever blister 10/16/2013  . Unspecified hypothyroidism 12/13/2012  . Other and unspecified hyperlipidemia 12/13/2012  . Anxiety state, unspecified 12/13/2012  . Edema 12/13/2012  . Anxiety state 12/13/2012  . Adult hypothyroidism 12/13/2012  . Type II or unspecified type diabetes mellitus without mention of complication, not stated as uncontrolled   . Urinary incontinence   . DEPRESSION 09/24/2007  . Essential hypertension 09/24/2007  . GERD 09/24/2007  . HIATAL HERNIA  09/24/2007  . 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 Headache(784.0) 09/24/2007    SCHINKE,CARL , [place], [place]  11/21/2014, 12:37 PM  [facility] [address] [place], [place], [address] Phone: [phone]   Fax:  [phone]

## 2014-11-21 NOTE — Therapy (Signed)
[facility] [address] [place], [place], [address] Phone: [phone]   Fax:  [phone]  Physical Therapy Treatment  Patient Details  Name: Kelly Fletcher MRN: [id number] Date of Birth: 12-Mar-1951 Referring Provider:  Estill Dooms, MD  Encounter Date: 11/21/2014      PT End of Session - 11/21/14 0930    Visit Number 8   Number of Visits 17   Date for PT Re-Evaluation 12/10/14   Authorization Type G-code & KX modifier   PT Start Time 0930   PT Stop Time 1015   PT Time Calculation (min) 45 min   Equipment Utilized During Treatment Gait belt   Activity Tolerance Patient tolerated treatment well   Behavior During Therapy [facility] for tasks assessed/performed      Past Medical History  Diagnosis Date  . Uterine prolapse   . Cystocele   . GERD (gastroesophageal reflux disease)   . Elevated cholesterol   . Rectocele   . Hypertension   . Arthritis   . Type II or unspecified type diabetes mellitus without mention of complication, uncontrolled     Type 2  . Urinary incontinence     Urgency  . Anxiety state, unspecified   . Palpitations   . Allergic rhinitis, cause unspecified   . Other and unspecified hyperlipidemia   . Unspecified hypothyroidism   . Edema   . Insomnia, unspecified   . Depressive disorder, not elsewhere classified   . Unspecified urinary incontinence   . Peptic ulcer, unspecified site, unspecified as acute or chronic, without mention of hemorrhage, perforation, or obstruction   . Disturbance of skin sensation   . Calculus of kidney   . Facial spasm 04/05/2014  . Abnormal brain MRI 04/13/2014  . 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 Monilial vaginitis 10/23/2014    Past Surgical History  Procedure Laterality Date  . Tonsillectomy and adenoidectomy  1963  . Dilation and curettage of uterus      3-05  . Hysteroscopy      3-05  . Endometrial biopsy      Dr.Gottsegen   . Tongue biopsy      There were no vitals filed for this visit.  Visit Diagnosis:  Abnormality of gait  Balance problems  Decreased functional activity tolerance      Subjective Assessment - 11/21/14 0940    Subjective On Friday she was doing UE HEP and looked up at fan. Became dizzy. Called MD and they said rest. Blood work was normal. Dizziness continued Sunday, Now off balance.    Currently in Pain? No/denies                Vestibular Assessment - 11/21/14 0930    Vestibular Assessment   General Observation no dizziness today, resolved 2 days ago   Symptom Behavior   Type of Dizziness Spinning   Aggravating Factors Looking up to the ceiling   Occulomotor Exam   Occulomotor Alignment Normal   Smooth Pursuits Intact   Saccades Intact   Comment eye only, head only, head & eye movements right/left & up/down with proper coordiation   Vestibulo-Occular Reflex   VOR 1 Head Only (x 1 viewing) WNL   VOR 2 Head and Object (x 2 viewing) WNL   Positional Testing   Dix-Hallpike Dix-Hallpike Right;Dix-Hallpike Left   Dix-Hallpike Right   Dix-Hallpike Right Duration WNL  Dix-Hallpike Left   Dix-Hallpike Left Duration WNL   Positional Sensitivities   Sit to Supine No dizziness   Supine to Left Side No dizziness   Supine to Right Side No dizziness   Supine to Sitting No dizziness   Right Hallpike No dizziness   Up from Right Hallpike No dizziness   Up from Left Hallpike No dizziness                 [facility] Adult PT Treatment/Exercise - 11/21/14 0930    Ambulation/Gait   Ambulation/Gait Yes   Ambulation/Gait Assistance 5: Supervision   Ambulation Distance (Feet) 300 Feet   Assistive device Straight cane   Gait Pattern Step-through pattern;Decreased stance time - right;Decreased hip/knee flexion - right   Ambulation Surface Indoor;Level     Therapeutic Exercise: See patient education PT instructed in use,  Level 1 for 8 min total - 1 min with LUE & BLE full ROM movements, then 1 min with RUE on handle also with PT supporting hand & elbow and pt controlling amount of ROM to tolerance only Repeated 87min bouts for 8 min total           PT Education - 11/21/14 0930    Education provided Yes   Education Details fitness plan with differences between [place], Set-up & use of recumbent stepper   Person(s) Educated Patient;Spouse   Methods Explanation;Demonstration;Verbal cues   Comprehension Verbalized understanding;Returned demonstration;Verbal cues required;Tactile cues required;Need further instruction          PT Short Term Goals - 11/12/14 1152    PT SHORT TERM GOAL #1   Title             PT Long Term Goals - 10/10/14 1354    PT LONG TERM GOAL #1   Title demonstrates / verbalizes understanding of ongoing HEP / fitness plan. (Target Date: 12/07/2014)   Time 8   Period Weeks   Status New   PT LONG TERM GOAL #2   Title Cognitive Timed Up & Go <15 sec safely  (Target Date: 12/07/2014)   Time 8   Period Weeks   Status New   PT LONG TERM GOAL #3   Title Berg Balance >/=52/56  (Target Date: 12/07/2014)   Time 8   Period Weeks   Status New   PT LONG TERM GOAL #4   Title Functional Gait Assessment >24/ 30  (Target Date: 12/07/2014)   Time 8   Period Weeks   Status New   PT LONG TERM GOAL #5   Title ambulates >1000' without device including grass, ramps, curbs, stairs indpendently.  (Target Date: 12/07/2014)   Time 8   Period Weeks   Status New               Plan - 11/21/14 0930    Clinical Impression Statement Based on patient & her husband discription, she had some vertigo / vestibular issues this weekend. Testing was not able to set off any further issues. Pt and husband appear to understand how to set-up & use NuStep. They report feeling like [facility] is better option for them.   Pt will benefit from skilled therapeutic intervention in order to  improve on the following deficits Abnormal gait;Decreased activity tolerance;Decreased balance;Decreased endurance;Decreased strength   Rehab Potential Good   PT Frequency 2x / week   PT Duration 8 weeks   PT Treatment/Interventions ADLs/[facility];DME Instruction;Gait training;Stair training;Functional mobility training;Therapeutic activities;Therapeutic exercise;Balance  training;Neuromuscular re-education;Patient/family education   PT Next Visit Plan check new exs added to HEP; cont with strengthening and high level balance training   PT Home Exercise Plan hip abduction strengthening for bil. LE's   Consulted and Agree with Plan of Care Patient;Family member/caregiver   Family Member Consulted husband        Problem List Patient Active Problem List   Diagnosis Date Noted  . Monilial vaginitis 10/23/2014  . Deep vein thrombosis 08/12/2014  . History of inferior vena caval filter placement 07/14/2014  . Malignant glioma of brain 06/28/2014  . Diabetes mellitus, type 2 06/27/2014  . 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 Tremor of both hands 04/05/2014  . Vertigo 04/05/2014  . BPPV (benign paroxysmal positional vertigo) 03/14/2014  . Seizures 12/16/2013  . Epigastric pain 11/01/2013  . Fever blister 10/16/2013  . Unspecified hypothyroidism 12/13/2012  . Other and unspecified hyperlipidemia 12/13/2012  . Anxiety state, unspecified 12/13/2012  . Edema 12/13/2012  . Anxiety state 12/13/2012  . Adult hypothyroidism 12/13/2012  . Type II or unspecified type diabetes mellitus without mention of complication, not stated as uncontrolled   . Urinary incontinence   . DEPRESSION 09/24/2007  . Essential hypertension 09/24/2007  . GERD 09/24/2007  . HIATAL HERNIA 09/24/2007  . 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 [id number](563.8) 09/24/2007    Worley Radermacher PT, DPT 11/21/2014, 5:43 PM  [facility] [address] [place], [place], [address] Phone: [phone]   Fax:  [phone]

## 2014-11-21 NOTE — Therapy (Signed)
[facility] [address] [place], [place], [address] Phone: [phone]   Fax:  [phone]  Occupational Therapy Treatment  Patient Details  Name: Kelly Fletcher MRN: [id number] Date of Birth: 08-Nov-1950 Referring Provider:  Estill Dooms, MD  Encounter Date: 11/21/2014      OT End of Session - 11/21/14 1431    Visit Number 9   Number of Visits 17   Date for OT Re-Evaluation 12/08/14   Authorization Type MCR - G code needed   Authorization Time Period 60 Days   Authorization - Visit Number 9   Authorization - Number of Visits 10   Activity Tolerance Patient tolerated treatment well;Patient limited by pain      Past Medical History  Diagnosis Date  . Uterine prolapse   . Cystocele   . GERD (gastroesophageal reflux disease)   . Elevated cholesterol   . Rectocele   . Hypertension   . Arthritis   . Type II or unspecified type diabetes mellitus without mention of complication, uncontrolled     Type 2  . Urinary incontinence     Urgency  . Anxiety state, unspecified   . Palpitations   . Allergic rhinitis, cause unspecified   . Other and unspecified hyperlipidemia   . Unspecified hypothyroidism   . Edema   . Insomnia, unspecified   . Depressive disorder, not elsewhere classified   . Unspecified urinary incontinence   . Peptic ulcer, unspecified site, unspecified as acute or chronic, without mention of hemorrhage, perforation, or obstruction   . Disturbance of skin sensation   . Calculus of kidney   . Facial spasm 04/05/2014  . Abnormal brain MRI 04/13/2014  . 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 Monilial vaginitis 10/23/2014    Past Surgical History  Procedure Laterality Date  . Tonsillectomy and adenoidectomy  1963  . Dilation and curettage of uterus      3-05  . Hysteroscopy      3-05  . Endometrial biopsy      Dr.Gottsegen    . Tongue biopsy      There were no vitals filed for this visit.  Visit Diagnosis:  Pain in joint, shoulder region, right  Weakness of right arm      Subjective Assessment - 11/21/14 1423    Subjective  My arm is still bothering me   Pertinent History glioblastoma with brain surgery to remove tumor 06/28/14, facial seizures   Patient Stated Goals To get my Rt shoulder and hand better b/c I'm Rt handed   Currently in Pain? Yes   Pain Score 4    Pain Location Shoulder   Pain Orientation Right   Pain Descriptors / Indicators Sore   Pain Type Chronic pain   Pain Onset More than a month ago   Pain Frequency Intermittent   Aggravating Factors  certain movements of Rt arm   Pain Relieving Factors ice, heat                      OT Treatments/Exercises (OP) - 11/21/14 0001    ADLs   ADL Comments Pt c/o pain worse at night in RUE, therefore reviewed proper positioning in bed while sidelying (Pt sleeps on Lt side)   Neurological Re-education Exercises   Other Exercises 1 Performed [place] ROM seated and supine in sh. flexion to first ease pain. Then  progressed to performing with ball supine with min facilitation for elbow extension RUE.    Manual Therapy   Manual therapy comments Kinesiotaping shoulder for pain management to relax pects, biceps, and middle deltoid; and to activate trunk extension/scapula retraction and downward depression, and sh. ER                  OT Short Term Goals - 11/21/14 1432    OT SHORT TERM GOAL #1   Title Independent with HEP for Rt hand coordination, RUE ROM and Rt hand strength (DUE 11/08/14)   Time 4   Period Weeks   Status Not Met  Pt has had severe pain in R shoulder therefore has not met any STG's as treatment has needed to focus on addressing pain   OT SHORT TERM GOAL #2   Title Pt to verbalize understanding with pain management strategies RUE   Time 4   Period Weeks   Status Partially Met  able to verbalize some  positioning and movement strategies   OT SHORT TERM GOAL #3   Title Pt to write name at 75% or greater legibility w/ A/E for pen prn Rt dominant hand   Baseline approx. 25% first name only with built up pen   Time 4   Period Weeks   Status Not Met   OT SHORT TERM GOAL #4   Title Improve RUE functional use as evidenced by performing 25 blocks or greater on Box & Blocks test   Baseline eval = 17   Time 4   Period Weeks   Status Not Met   OT SHORT TERM GOAL #5   Title Pt to improve grip strength Rt hand to 20 lbs or greater to decrease drops   Baseline eval = 10 lbs (Lt = 55 lbs)   Time 4   Period Weeks   Status Not Met   OT SHORT TERM GOAL #6   Title Pt to tie shoes and hook bra mod I level incorporating Rt dominant hand for assist   Baseline dependent   Time 4   Period Weeks   Status Not Met   OT SHORT TERM GOAL #7   Title Pt to use Rt dominant hand to feed [place] and groom [place] 50% of the time    Baseline none with Rt hand, using Lt non dominant hand   Time 4   Period Weeks   Status Not Met           OT Long Term Goals - 11/21/14 1432    OT LONG TERM GOAL #1   Title Independent with updated HEP for RUE strength (DUE 12/08/14)   Time 8   Period Weeks   Status Deferred  ALL LTG's will be deferred until renewal period secondary to pain in Rt shoulder limiting ability to work towards these goals. To work on [employer] as able and revise prn for renewal period in July   [place] #2   Title Pt to perform high level reaching RUE to retrieve/replace 3 lb. object from high shelf 10 times w/o rest   Baseline only 75% shoulder flexion   Time 8   Period Weeks   Status Deferred   OT LONG TERM GOAL #3   Title Improve RUE functional use as evidenced by performing 40 blocks on Box & Blocks test    Baseline eval = 17   Time 8   Period Weeks   Status Deferred   OT LONG TERM  GOAL #4   Title Improve coordination as evidenced by performing 9 hole peg test in under 90 sec.     Baseline eval: only able to place 1 peg in 60 sec.    Time 8   Period Weeks   Status Deferred   OT LONG TERM GOAL #5   Title Pt to improve grip strength Rt dominant hand to 30 lbs or greater to open jars/containers   Baseline eval = 10 lbs   Time 8   Period Weeks   Status Deferred   OT LONG TERM GOAL #6   Title Pt to return to using Rt hand as dominant hand for all BADLS 29% of the time or greater   Baseline only using LUE   Time 8   Period Weeks   Status Deferred   OT LONG TERM GOAL #7   Title Pt to write short paragraph with Rt dominant hand using A/E prn with 90% legibility    Baseline 25% legibility for first name only   Time 8   Period Weeks   Status Deferred   OT LONG TERM GOAL #8   Title Pt to return to light cooking/cleaning tasks mod I level with A/E and/or DME prn   Baseline dependent   Time 8   Period Weeks   Status Deferred               Plan - 11/21/14 1435    Clinical Impression Statement Pt continues to be limited in progress due to Rt shoulder pain. Pt with less pain at end of session today, but began 3-4/10 and reports worse at night. Therapist has deferred LTG's until further (and will address/revise at next renewal period) and will work towards STG's as able.    Clinical Impairments Affecting Rehab Potential pain RUE   Plan Assess kinesiotape, continue to monitor/address shoulder pain, work towards STG's as able, G-CODE DUE!!   Consulted and Agree with Plan of Care Patient        Problem List Patient Active Problem List   Diagnosis Date Noted  . Monilial vaginitis 10/23/2014  . Deep vein thrombosis 08/12/2014  . History of inferior vena caval filter placement 07/14/2014  . Malignant glioma of brain 06/28/2014  . Diabetes mellitus, type 2 06/27/2014  . 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 Tremor of both hands 04/05/2014  . Vertigo 04/05/2014  . BPPV (benign paroxysmal positional vertigo) 03/14/2014  . Seizures 12/16/2013  .  Epigastric pain 11/01/2013  . Fever blister 10/16/2013  . Unspecified hypothyroidism 12/13/2012  . Other and unspecified hyperlipidemia 12/13/2012  . Anxiety state, unspecified 12/13/2012  . Edema 12/13/2012  . Anxiety state 12/13/2012  . Adult hypothyroidism 12/13/2012  . Type II or unspecified type diabetes mellitus without mention of complication, not stated as uncontrolled   . Urinary incontinence   . DEPRESSION 09/24/2007  . Essential hypertension 09/24/2007  . GERD 09/24/2007  . HIATAL HERNIA 09/24/2007  . 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 Headache(784.0) 09/24/2007    Carey Bullocks, OTR/L 11/21/2014, 2:41 PM  [facility] [address] [place], [place], [address] Phone: [phone]   Fax:  [phone]

## 2014-11-22 ENCOUNTER — Encounter: Payer: Self-pay | Admitting: Occupational Therapy

## 2014-11-22 ENCOUNTER — Encounter: Payer: Self-pay | Admitting: Physical Therapy

## 2014-11-22 ENCOUNTER — Ambulatory Visit: Payer: Medicare Other | Admitting: Physical Therapy

## 2014-11-22 ENCOUNTER — Ambulatory Visit: Payer: Medicare Other | Admitting: Occupational Therapy

## 2014-11-22 DIAGNOSIS — R269 Unspecified abnormalities of gait and mobility: Secondary | ICD-10-CM

## 2014-11-22 DIAGNOSIS — R29898 Other symptoms and signs involving the musculoskeletal system: Secondary | ICD-10-CM

## 2014-11-22 DIAGNOSIS — R4189 Other symptoms and signs involving cognitive functions and awareness: Secondary | ICD-10-CM

## 2014-11-22 DIAGNOSIS — R6889 Other general symptoms and signs: Secondary | ICD-10-CM

## 2014-11-22 DIAGNOSIS — R2689 Other abnormalities of gait and mobility: Secondary | ICD-10-CM

## 2014-11-22 DIAGNOSIS — R29818 Other symptoms and signs involving the nervous system: Secondary | ICD-10-CM | POA: Diagnosis not present

## 2014-11-22 DIAGNOSIS — M25511 Pain in right shoulder: Secondary | ICD-10-CM

## 2014-11-22 DIAGNOSIS — G8191 Hemiplegia, unspecified affecting right dominant side: Secondary | ICD-10-CM

## 2014-11-22 NOTE — Therapy (Signed)
[facility] [address] [place], [place], [address] Phone: [phone]   Fax:  [phone]  Physical Therapy Treatment  Patient Details  Name: Kelly Fletcher MRN: [id number] Date of Birth: 1951-02-08 Referring Provider:  Estill Dooms, MD  Encounter Date: 11/22/2014      PT End of Session - 11/22/14 1315    Visit Number 9   Number of Visits 17   Date for PT Re-Evaluation 12/10/14   Authorization Type G-code & KX modifier   PT Start Time 1315   PT Stop Time 1400   PT Time Calculation (min) 45 min   Equipment Utilized During Treatment Gait belt   Activity Tolerance Patient tolerated treatment well   Behavior During Therapy [facility] for tasks assessed/performed      Past Medical History  Diagnosis Date  . Uterine prolapse   . Cystocele   . GERD (gastroesophageal reflux disease)   . Elevated cholesterol   . Rectocele   . Hypertension   . Arthritis   . Type II or unspecified type diabetes mellitus without mention of complication, uncontrolled     Type 2  . Urinary incontinence     Urgency  . Anxiety state, unspecified   . Palpitations   . Allergic rhinitis, cause unspecified   . Other and unspecified hyperlipidemia   . Unspecified hypothyroidism   . Edema   . Insomnia, unspecified   . Depressive disorder, not elsewhere classified   . Unspecified urinary incontinence   . Peptic ulcer, unspecified site, unspecified as acute or chronic, without mention of hemorrhage, perforation, or obstruction   . Disturbance of skin sensation   . Calculus of kidney   . Facial spasm 04/05/2014  . Abnormal brain MRI 04/13/2014  . 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 Monilial vaginitis 10/23/2014    Past Surgical History  Procedure Laterality Date  . Tonsillectomy and adenoidectomy  1963  . Dilation and curettage of uterus      3-05  . Hysteroscopy      3-05  . Endometrial biopsy      Dr.Gottsegen   . Tongue biopsy      There were no vitals filed for this visit.  Visit Diagnosis:  Abnormality of gait  Balance problems  Decreased functional activity tolerance  Right leg weakness      Subjective Assessment - 11/22/14 1413    Subjective No other dizziness issues. Arm was not sore from using it on Stepper some yesterday.   Currently in Pain? Yes   Pain Score 4    Pain Location Shoulder   Pain Orientation Right                Vestibular Assessment - 11/21/14 0930    Vestibular Assessment   General Observation no dizziness today, resolved 2 days ago   Symptom Behavior   Type of Dizziness Spinning   Aggravating Factors Looking up to the ceiling   Occulomotor Exam   Occulomotor Alignment Normal   Smooth Pursuits Intact   Saccades Intact   Comment eye only, head only, head & eye movements right/left & up/down with proper coordiation   Vestibulo-Occular Reflex   VOR 1 Head Only (x 1 viewing) WNL   VOR 2 Head and Object (x 2 viewing) WNL   Positional Testing   Dix-Hallpike Dix-Hallpike Right;Dix-Hallpike Left   Dix-Hallpike Right   Dix-Hallpike Right  Duration WNL   Dix-Hallpike Left   Dix-Hallpike Left Duration WNL   Positional Sensitivities   Sit to Supine No dizziness   Supine to Left Side No dizziness   Supine to Right Side No dizziness   Supine to Sitting No dizziness   Right Hallpike No dizziness   Up from Right Hallpike No dizziness   Up from Left Hallpike No dizziness                 [facility] Adult PT Treatment/Exercise - 11/22/14 1315    Ambulation/Gait   Ambulation/Gait Yes   Ambulation/Gait Assistance 5: Supervision   Ambulation/Gait Assistance Details tactile cues on maintaining speed / path with scanning environment   Ambulation Distance (Feet) 400 Feet   Assistive device None   Gait Pattern Step-through pattern   Ambulation Surface Indoor;Level   Stairs Yes   Stairs Assistance 5:  Supervision   Stair Management Technique One rail Left;Alternating pattern;Forwards   Number of Stairs 4  2 reps   Ramp 5: Supervision  contact assist, no device   Ramp Details (indicate cue type and reason) posture, step length   Curb 5: Supervision  no device, contact assist   Curb Details (indicate cue type and reason) wt shift, step length   Balance   Balance Assessed Yes   Dynamic Standing Balance   Dynamic Standing - Balance Support No upper extremity supported;During functional activity   Dynamic Standing - Level of Assistance 4: Min assist   Dynamic Standing - Balance Activities Other (comment)  alternate stepping LEs forward and stepping backward   High Level Balance   High Level Balance Activities Backward walking;Tandem walking;Head turns   High Level Balance Comments no device, tactile cues for righting reaction   Knee/Hip Exercises: Aerobic   Stationary Bike NuStep 10 min total alt. 1 min with RUE on / RUE off using LUE & BLE whole time level 3  when RUE on handle / machine PT supporting & painfree ROM   Tread Mill 1.71mph X 6 min on gait trainer program, initial CGA then SBA, BUE support  step length L52cm, R57cm, variation 13% Bil, time 50-50   Knee/Hip Exercises: Machines for Strengthening   Total Gym Leg Press BLE 60# 15x, LLE 40# 15x, RLE 30# 15x  PT cueing control, technique                PT Education - 11/21/14 0930    Education provided Yes   Education Details fitness plan with differences between [employer], Set-up & use of recumbent stepper   Person(s) Educated Patient;Spouse   Methods Explanation;Demonstration;Verbal cues   Comprehension Verbalized understanding;Returned demonstration;Verbal cues required;Tactile cues required;Need further instruction          PT Short Term Goals - 11/12/14 1152    PT SHORT TERM GOAL #1   Title             PT Long Term Goals - 10/10/14 1354    PT LONG TERM GOAL #1   Title  demonstrates / verbalizes understanding of ongoing HEP / fitness plan. (Target Date: 12/07/2014)   Time 8   Period Weeks   Status New   PT LONG TERM GOAL #2   Title Cognitive Timed Up & Go <15 sec safely  (Target Date: 12/07/2014)   Time 8   Period Weeks   Status New   PT LONG TERM GOAL #3   Title Berg Balance >/=52/56  (Target Date: 12/07/2014)  Time 8   Period Weeks   Status New   PT LONG TERM GOAL #4   Title Functional Gait Assessment >24/ 30  (Target Date: 12/07/2014)   Time 8   Period Weeks   Status New   PT LONG TERM GOAL #5   Title ambulates >1000' without device including grass, ramps, curbs, stairs indpendently.  (Target Date: 12/07/2014)   Time 8   Period Weeks   Status New               Plan - 11/22/14 1315    Clinical Impression Statement Patient appears on time to meet LTGs by 12/07/2014. She improved gait with scanning with no balance losses. Patient appears could safely use NuStep, treadmill & leg press with her husband's cues at [facility].   Pt will benefit from skilled therapeutic intervention in order to improve on the following deficits Abnormal gait;Decreased activity tolerance;Decreased balance;Decreased endurance;Decreased strength   Rehab Potential Good   PT Frequency 2x / week   PT Duration 8 weeks   PT Treatment/Interventions ADLs/[facility];DME Instruction;Gait training;Stair training;Functional mobility training;Therapeutic activities;Therapeutic exercise;Balance training;Neuromuscular re-education;Patient/family education   PT Next Visit Plan Do G-Code, gait without device, high level balance, cont towards LTGs   Consulted and Agree with Plan of Care Patient;Family member/caregiver   Family Member Consulted husband        Problem List Patient Active Problem List   Diagnosis Date Noted  . Monilial vaginitis 10/23/2014  . Deep vein thrombosis 08/12/2014  . History of inferior vena caval filter placement 07/14/2014  . 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 Diabetes mellitus, type 2 06/27/2014  . 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 Tremor of both hands 04/05/2014  . Vertigo 04/05/2014  . BPPV (benign paroxysmal positional vertigo) 03/14/2014  . Seizures 12/16/2013  . Epigastric pain 11/01/2013  . Fever blister 10/16/2013  . Unspecified hypothyroidism 12/13/2012  . Other and unspecified hyperlipidemia 12/13/2012  . Anxiety state, unspecified 12/13/2012  . Edema 12/13/2012  . Anxiety state 12/13/2012  . Adult hypothyroidism 12/13/2012  . Type II or unspecified type diabetes mellitus without mention of complication, not stated as uncontrolled   . Urinary incontinence   . DEPRESSION 09/24/2007  . Essential hypertension 09/24/2007  . GERD 09/24/2007  . HIATAL HERNIA 09/24/2007  . 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 EYCXKGYJ(856.3) 09/24/2007    Ishaan Villamar PT, DPT 11/22/2014, 2:31 PM  [facility] [address] [place], [place], [address] Phone: [phone]   Fax:  [phone]

## 2014-11-22 NOTE — Therapy (Signed)
[facility] [address] [place], [place], [address] Phone: [phone]   Fax:  [phone]  Occupational Therapy Treatment  Patient Details  Name: Kelly Fletcher MRN: [id number] Date of Birth: 05/19/1951 Referring Provider:  Estill Dooms, MD  Encounter Date: 11/22/2014      OT End of Session - 11/22/14 1525    Visit Number 10   Number of Visits 17   Date for OT Re-Evaluation 12/08/14   Authorization Type MCR - G code needed   Authorization Time Period 60 Days   OT Start Time 1401   OT Stop Time 1445   OT Time Calculation (min) 44 min   Activity Tolerance Patient tolerated treatment well      Past Medical History  Diagnosis Date  . Uterine prolapse   . Cystocele   . GERD (gastroesophageal reflux disease)   . Elevated cholesterol   . Rectocele   . Hypertension   . Arthritis   . Type II or unspecified type diabetes mellitus without mention of complication, uncontrolled     Type 2  . Urinary incontinence     Urgency  . Anxiety state, unspecified   . Palpitations   . Allergic rhinitis, cause unspecified   . Other and unspecified hyperlipidemia   . Unspecified hypothyroidism   . Edema   . Insomnia, unspecified   . Depressive disorder, not elsewhere classified   . Unspecified urinary incontinence   . Peptic ulcer, unspecified site, unspecified as acute or chronic, without mention of hemorrhage, perforation, or obstruction   . Disturbance of skin sensation   . Calculus of kidney   . Facial spasm 04/05/2014  . Abnormal brain MRI 04/13/2014  . 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 Monilial vaginitis 10/23/2014    Past Surgical History  Procedure Laterality Date  . Tonsillectomy and adenoidectomy  1963  . Dilation and curettage of uterus      3-05  . Hysteroscopy      3-05  . Endometrial biopsy      Dr.Gottsegen   . Tongue biopsy       There were no vitals filed for this visit.  Visit Diagnosis:  Pain in joint, shoulder region, right  Weakness of right arm  Cognitive deficits  Hemiplegia affecting right dominant side      Subjective Assessment - 11/22/14 1408    Subjective  My hand is not as swollen and I can use it more   Patient is accompained by: Family member   Pertinent History glioblastoma with brain surgery to remove tumor 06/28/14, facial seizures   Patient Stated Goals To get my Rt shoulder and hand better b/c I'm Rt handed   Currently in Pain? Yes   Pain Score 4    Pain Location Shoulder   Pain Descriptors / Indicators Sore   Pain Type Chronic pain   Pain Onset More than a month ago   Pain Frequency Intermittent   Aggravating Factors  raising arm above 70* of flexion,    Pain Relieving Factors low reach, ice, heat, positioning              Vestibular Assessment - 11/21/14 0930    Vestibular Assessment   General Observation no dizziness today, resolved 2 days ago   Symptom Behavior   Type of Dizziness Spinning   Aggravating Factors Looking up to the ceiling  Occulomotor Exam   Occulomotor Alignment Normal   Smooth Pursuits Intact   Saccades Intact   Comment eye only, head only, head & eye movements right/left & up/down with proper coordiation   Vestibulo-Occular Reflex   VOR 1 Head Only (x 1 viewing) WNL   VOR 2 Head and Object (x 2 viewing) WNL   Positional Testing   Dix-Hallpike Dix-Hallpike Right;Dix-Hallpike Left   Dix-Hallpike Right   Dix-Hallpike Right Duration WNL   Dix-Hallpike Left   Dix-Hallpike Left Duration WNL   Positional Sensitivities   Sit to Supine No dizziness   Supine to Left Side No dizziness   Supine to Right Side No dizziness   Supine to Sitting No dizziness   Right Hallpike No dizziness   Up from Right Hallpike No dizziness   Up from Left Hallpike No dizziness                OT Treatments/Exercises (OP) - 11/22/14 1519    Neurological  Re-education Exercises   Other Exercises 1 Neuro re ed to address functional reach in supine with emphasis on closed chain overhead reach to approximately 140* of shoulder flexion without pain. Pt also able to do open chain reach, place and hold to 90* of shoulder flexion without pain. Pt also able to incorporate grasp and release activity in supine to 90* with improved hand control.  In sitting addressed closed chain reach to approximately 90* of shoulder flexion as well as resistance at 90* closed chain. Pt able to reach to approximately 70* of open chain activity without pain by end of session. Pt requires encouragement and redirection to stay on task. Husband given name and number of physiatrist to make appointment as pt would benefit from these services.                   OT Short Term Goals - 11/22/14 1524    OT SHORT TERM GOAL #1   Title Independent with HEP for Rt hand coordination, RUE ROM and Rt hand strength (DUE 11/08/14)   Time 4   Period Weeks   Status Not Met  Pt has had severe pain in R shoulder therefore has not met any STG's as treatment has needed to focus on addressing pain   OT SHORT TERM GOAL #2   Title Pt to verbalize understanding with pain management strategies RUE   Time 4   Period Weeks   Status Partially Met  able to verbalize some positioning and movement strategies   OT SHORT TERM GOAL #3   Title Pt to write name at 75% or greater legibility w/ A/E for pen prn Rt dominant hand   Baseline approx. 25% first name only with built up pen   Time 4   Period Weeks   Status Not Met   OT SHORT TERM GOAL #4   Title Improve RUE functional use as evidenced by performing 25 blocks or greater on Box & Blocks test   Baseline eval = 17   Time 4   Period Weeks   Status Not Met   OT SHORT TERM GOAL #5   Title Pt to improve grip strength Rt hand to 20 lbs or greater to decrease drops   Baseline eval = 10 lbs (Lt = 55 lbs)   Time 4   Period Weeks   Status Not Met    OT SHORT TERM GOAL #6   Title Pt to tie shoes and hook bra mod I level incorporating Rt dominant  hand for assist   Baseline dependent   Time 4   Period Weeks   Status Not Met   OT SHORT TERM GOAL #7   Title Pt to use Rt dominant hand to feed Kelly Fletcher and groom Kelly Fletcher 50% of the time    Baseline none with Rt hand, using Lt non dominant hand   Time 4   Period Weeks   Status Not Met           OT Long Term Goals - 11/22/14 1524    OT LONG TERM GOAL #1   Title Independent with updated HEP for RUE strength (DUE 12/08/14)   Time 8   Period Weeks   Status Deferred  ALL LTG's will be deferred until renewal period secondary to pain in Rt shoulder limiting ability to work towards these goals. To work on [employer] as able and revise prn for renewal period in July   [place] #2   Title Pt to perform high level reaching RUE to retrieve/replace 3 lb. object from high shelf 10 times w/o rest   Baseline only 75% shoulder flexion   Time 8   Period Weeks   Status Deferred   OT LONG TERM GOAL #3   Title Improve RUE functional use as evidenced by performing 40 blocks on Box & Blocks test    Baseline eval = 17   Time 8   Period Weeks   Status Deferred   OT LONG TERM GOAL #4   Title Improve coordination as evidenced by performing 9 hole peg test in under 90 sec.    Baseline eval: only able to place 1 peg in 60 sec.    Time 8   Period Weeks   Status Deferred   OT LONG TERM GOAL #5   Title Pt to improve grip strength Rt dominant hand to 30 lbs or greater to open jars/containers   Baseline eval = 10 lbs   Time 8   Period Weeks   Status Deferred   OT LONG TERM GOAL #6   Title Pt to return to using Rt hand as dominant hand for all BADLS 84% of the time or greater   Baseline only using LUE   Time 8   Period Weeks   Status Deferred   OT LONG TERM GOAL #7   Title Pt to write short paragraph with Rt dominant hand using A/E prn with 90% legibility    Baseline 25% legibility for first name  only   Time 8   Period Weeks   Status Deferred   OT LONG TERM GOAL #8   Title Pt to return to light cooking/cleaning tasks mod I level with A/E and/or DME prn   Baseline dependent   Time 8   Period Weeks   Status Deferred               Plan - 11/22/14 1524    Clinical Impression Statement Pt making slow progress toward goals. Pt requires encouragement and redirection to stay on task due to low frustration tolerance and decreased sustained attention. Husband very supportive.    Pt will benefit from skilled therapeutic intervention in order to improve on the following deficits (Retired) Decreased coordination;Decreased range of motion;Difficulty walking;Decreased safety awareness;Decreased endurance;Increased edema;Impaired sensation;Decreased activity tolerance;Decreased knowledge of precautions;Impaired tone;Decreased balance;Decreased knowledge of use of DME;Impaired UE functional use;Pain;Decreased cognition;Decreased mobility;Decreased strength   Rehab Potential Good   Clinical Impairments Affecting Rehab Potential pain RUE   OT Frequency 2x /  week   OT Duration 8 weeks   OT Treatment/Interventions [facility]-care/ADL training;Fluidtherapy;Moist Heat;DME and/or AE instruction;Splinting;Patient/family education;Therapeutic exercises;Balance training;Therapeutic activities;Neuromuscular education;Functional Mobility Training;Passive range of motion;Cognitive remediation/compensation;Electrical Stimulation;Parrafin;Energy conservation;Manual Therapy   Plan Pt stated she was not sure if taping helped or not. Monitor and address shoulder pain, continue toward goals.   Consulted and Agree with Plan of Care Patient   Family Member Consulted HUSBAND          G-Codes - 2014/12/19 1527    Functional Limitation Carrying, moving and handling objects   Carrying, Moving and Handling Objects Current Status [phone]) At least 80 percent but less than 100 percent impaired, limited or restricted    Carrying, Moving and Handling Objects Goal Status (O1308) At least 20 percent but less than 40 percent impaired, limited or restricted      Problem List Patient Active Problem List   Diagnosis Date Noted  . Monilial vaginitis 10/23/2014  . Deep vein thrombosis 08/12/2014  . History of inferior vena caval filter placement 07/14/2014  . Malignant glioma of brain 06/28/2014  . Diabetes mellitus, type 2 06/27/2014  . 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 Tremor of both hands 04/05/2014  . Vertigo 04/05/2014  . BPPV (benign paroxysmal positional vertigo) 03/14/2014  . Seizures 12/16/2013  . Epigastric pain 11/01/2013  . Fever blister 10/16/2013  . Unspecified hypothyroidism 12/13/2012  . Other and unspecified hyperlipidemia 12/13/2012  . Anxiety state, unspecified 12/13/2012  . Edema 12/13/2012  . Anxiety state 12/13/2012  . Adult hypothyroidism 12/13/2012  . Type II or unspecified type diabetes mellitus without mention of complication, not stated as uncontrolled   . Urinary incontinence   . DEPRESSION 09/24/2007  . Essential hypertension 09/24/2007  . GERD 09/24/2007  . HIATAL HERNIA 09/24/2007  . 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 Headache(784.0) 09/24/2007    Quay Burow OTR/L 2014/12/19, 3:28 PM  [facility] [address] [place], [place], [address] Phone: [phone]   Fax:  [phone]

## 2014-11-23 ENCOUNTER — Ambulatory Visit (INDEPENDENT_AMBULATORY_CARE_PROVIDER_SITE_OTHER): Payer: Medicare Other | Admitting: Internal Medicine

## 2014-11-23 ENCOUNTER — Encounter: Payer: Self-pay | Admitting: Internal Medicine

## 2014-11-23 ENCOUNTER — Ambulatory Visit
Admission: RE | Admit: 2014-11-23 | Discharge: 2014-11-23 | Disposition: A | Discharge status: Home / Self Care | Payer: Medicare Other | Source: Ambulatory Visit | Attending: Internal Medicine | Admitting: Internal Medicine

## 2014-11-23 VITALS — BP 132/86 | HR 68 | Temp 97.8°F | Resp 20 | Ht 69.0 in | Wt 164.0 lb

## 2014-11-23 DIAGNOSIS — S93402A Sprain of unspecified ligament of left ankle, initial encounter: Secondary | ICD-10-CM

## 2014-11-23 DIAGNOSIS — C719 Malignant neoplasm of brain, unspecified: Secondary | ICD-10-CM

## 2014-11-23 NOTE — Progress Notes (Signed)
Patient ID: Kelly Fletcher, female   DOB: 03/09/1951, 64 y.o.   MRN: [id number]    Location:    [facility]   Place of Service:   OFFICE  Chief Complaint  Patient presents with  . Acute Visit    sprained ankle    HPI:  64 yo female seen today for left ankle pain. She twisted her ankle last night when she almost fell while standing up from couch. Her LLE is her "good" foot. She stopped herself from falling by grabbing the couch. No numbness or tingling. She has pain with weight bearing. Applied ice compress most of the night. No obvious swelling. She is now limping.  She joined a gym recently and started exercising with stationary bike this week. She has felt that her left ankle was weak for "quite some time"  She completed decadron tx a few weeks ago for tx brain tumor. She has had sx several weeks ago. She is currently receiving rehab including ST  Spouse is present today.  Past Medical History  Diagnosis Date  . Uterine prolapse   . Cystocele   . GERD (gastroesophageal reflux disease)   . Elevated cholesterol   . Rectocele   . Hypertension   . Arthritis   . Type II or unspecified type diabetes mellitus without mention of complication, uncontrolled     Type 2  . Urinary incontinence     Urgency  . Anxiety state, unspecified   . Palpitations   . Allergic rhinitis, cause unspecified   . Other and unspecified hyperlipidemia   . Unspecified hypothyroidism   . Edema   . Insomnia, unspecified   . Depressive disorder, not elsewhere classified   . Unspecified urinary incontinence   . Peptic ulcer, unspecified site, unspecified as acute or chronic, without mention of hemorrhage, perforation, or obstruction   . Disturbance of skin sensation   . Calculus of kidney   . Facial spasm 04/05/2014  . Abnormal brain MRI 04/13/2014  . 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 Monilial vaginitis 10/23/2014    Past Surgical  History  Procedure Laterality Date  . Tonsillectomy and adenoidectomy  1963  . Dilation and curettage of uterus      3-05  . Hysteroscopy      3-05  . Endometrial biopsy      Dr.Gottsegen   . Tongue biopsy      Patient Care Team: Arthur G Green, MD as PCP - General (Internal Medicine) Robert Sypher, MD as Consulting Physician (Orthopedic Surgery) Mark Ottelin, MD as Consulting Physician (Urology) David D Grove, MD as Attending Physician (Cardiology) Sally Miller, OD (Optometry) Daniel P Jacobs, MD as Attending Physician (Gastroenterology)  History   Social History  . Marital Status: Married    Spouse Name: N/A  . Number of Children: N/A  . Years of Education: N/A   Occupational History  . Not on file.   Social History Main Topics  . Smoking status: Never Smoker   . Smokeless tobacco: Never Used  . Alcohol Use: No  . Drug Use: No  . Sexual Activity: No   Other Topics Concern  . Not on file   Social History Narrative   Right handed, married 1 kids, taking care of granddaughter, caffeine 2 cups in am , soda in pm, [employer] grad.     reports that she has never smoked. She has never used smokeless tobacco.   She reports that she does not drink alcohol or use illicit drugs.  Allergies  Allergen Reactions  . Shellfish Allergy Anaphylaxis  . Ibuprofen   . Latex     Skin rash  . Naproxen   . Nitrofurantoin Monohyd Macro   . Paroxetine Hcl   . Prednisone   . Prozac [Fluoxetine Hcl]   . Tegretol [Carbamazepine] Hives    Medications: Patient's Medications  New Prescriptions   No medications on file  Previous Medications   ATORVASTATIN (LIPITOR) 10 MG TABLET    ONE DAILY TO CONTROL CHOLESTEROL   B COMPLEX VITAMINS TABLET    Take 1 tablet by mouth daily.   CHOLECALCIFEROL (VITAMIN D) 1000 UNITS TABLET    Take 1,000 Units by mouth daily. Takes 2000-5000mg daily   CLOBAZAM (ONFI) 10 MG TABLET    Take 10 mg by mouth at bedtime.   CLONAZEPAM (KLONOPIN) 1 MG TABLET    Take  1 mg by mouth as needed. Take at onset of seizure   DOCUSATE SODIUM (COLACE) 100 MG CAPSULE    Take 100 mg by mouth 2 (two) times daily.   ESCITALOPRAM (LEXAPRO) 10 MG TABLET    Take 10 mg by mouth daily.   FLUCONAZOLE (DIFLUCAN) 150 MG TABLET    One daily to treat yeast   LACOSAMIDE (VIMPAT) 200 MG TABS TABLET    Take 200 mg by mouth 2 (two) times daily.   LEVETIRACETAM (KEPPRA) 750 MG TABLET    Take 1,500 mg by mouth 2 (two) times daily.   LEVOTHYROXINE (SYNTHROID, LEVOTHROID) 125 MCG TABLET    TAKE 1 TABLET (125 MCG TOTAL) BY MOUTH DAILY BEFORE BREAKFAST.   LORATADINE (CLARITIN) 10 MG TABLET    Take 10 mg by mouth daily as needed for allergies.   METFORMIN (GLUCOPHAGE-XR) 500 MG 24 HR TABLET    TAKE 1 TABLET TWICE DAILY TO CONTROL BLOOD SUGAR   MICONAZOLE NITRATE VAGINAL 4 % CREA    Apply one applicator nightly for 3 nights   ONDANSETRON (ZOFRAN) 8 MG TABLET    Take 8 mg by mouth every 8 (eight) hours as needed.   ONE TOUCH ULTRA TEST TEST STRIP    CHECK BLOOD GLUCOSE AS DIRECTED   PANTOPRAZOLE (PROTONIX) 40 MG TABLET    Take 40 mg by mouth daily.   SENNOSIDES-DOCUSATE SODIUM (SENOKOT-S) 8.6-50 MG TABLET    Take 1 tablet by mouth daily.   TEMOZOLOMIDE (TEMODAR) 180 MG CAPSULE    Take 380 mg by mouth daily.   VALACYCLOVIR (VALTREX) 1000 MG TABLET    Take 1,000 mg by mouth 2 (two) times daily as needed. To help resolve viral blisters  Modified Medications   No medications on file  Discontinued Medications   No medications on file    Review of Systems  Musculoskeletal: Positive for joint swelling and gait problem.  Skin: Negative for rash.  Neurological: Positive for speech difficulty (since brain tumor surgery) and weakness (left ankle for "quite some time". chronic right hemiparesis). Negative for dizziness.  All other systems reviewed and are negative.   Filed Vitals:   11/23/14 1120  BP: 132/86  Pulse: 68  Temp: 97.8 F (36.6 C)  TempSrc: Oral  Resp: 20  Height: 5' 9" (1.753  m)  Weight: 164 lb (74.39 kg)  SpO2: 96%   Body mass index is 24.21 kg/(m^2).  Physical Exam  Constitutional: She is oriented to person, place, and time. She appears well-developed and well-nourished. No distress.  Cardiovascular: Intact distal   pulses.   Pulses:      Dorsalis pedis pulses are 2+ on the right side, and 2+ on the left side.  Musculoskeletal: She exhibits edema and tenderness.       Right knee: She exhibits decreased range of motion, swelling and erythema. She exhibits no deformity. Tenderness found.       Feet:  Left Lateral posterior and anterior malleolus swelling and TTP with reduced ankle ROM. No calf TTPno achilles tendon TTP. Strength reduced with dorsiflexion R>L. Gait antalgic.  Neurological: She is alert and oriented to person, place, and time.  Skin: Skin is warm and dry. No rash noted.  Psychiatric: Her behavior is normal. Thought content normal. Her mood appears anxious. Her speech is slurred.     Labs reviewed: Appointment on 11/19/2014  Component Date Value Ref Range Status  . WBC 11/19/2014 5.8  3.9 - 10.3 10e3/uL Final  . NEUT# 11/19/2014 4.6  1.5 - 6.5 10e3/uL Final  . HGB 11/19/2014 13.7  11.6 - 15.9 g/dL Final  . HCT 11/19/2014 40.4  34.8 - 46.6 % Final  . Platelets 11/19/2014 161  145 - 400 10e3/uL Final  . MCV 11/19/2014 91.8  79.5 - 101.0 fL Final  . MCH 11/19/2014 31.1  25.1 - 34.0 pg Final  . MCHC 11/19/2014 33.9  31.5 - 36.0 g/dL Final  . RBC 11/19/2014 4.40  3.70 - 5.45 10e6/uL Final  . RDW 11/19/2014 13.3  11.2 - 14.5 % Final  . lymph# 11/19/2014 0.5* 0.9 - 3.3 10e3/uL Final  . MONO# 11/19/2014 0.5  0.1 - 0.9 10e3/uL Final  . Eosinophils Absolute 11/19/2014 0.1  0.0 - 0.5 10e3/uL Final  . Basophils Absolute 11/19/2014 0.0  0.0 - 0.1 10e3/uL Final  . NEUT% 11/19/2014 79.8* 38.4 - 76.8 % Final  . LYMPH% 11/19/2014 9.0* 14.0 - 49.7 % Final  . MONO% 11/19/2014 9.3  0.0 - 14.0 % Final  . EOS% 11/19/2014 1.7  0.0 - 7.0 % Final  . BASO%  11/19/2014 0.2  0.0 - 2.0 % Final  . Sodium 11/19/2014 141  136 - 145 mEq/L Final  . Potassium 11/19/2014 4.0  3.5 - 5.1 mEq/L Final  . Chloride 11/19/2014 105  98 - 109 mEq/L Final  . CO2 11/19/2014 27  22 - 29 mEq/L Final  . Glucose 11/19/2014 117  70 - 140 mg/dl Final  . BUN 11/19/2014 15.7  7.0 - 26.0 mg/dL Final  . Creatinine 11/19/2014 0.7  0.6 - 1.1 mg/dL Final  . Total Bilirubin 11/19/2014 0.60  0.20 - 1.20 mg/dL Final  . Alkaline Phosphatase 11/19/2014 52  40 - 150 U/L Final  . AST 11/19/2014 14  5 - 34 U/L Final  . ALT 11/19/2014 21  0 - 55 U/L Final  . Total Protein 11/19/2014 6.0* 6.4 - 8.3 g/dL Final  . Albumin 11/19/2014 4.0  3.5 - 5.0 g/dL Final  . Calcium 11/19/2014 9.3  8.4 - 10.4 mg/dL Final  . Anion Gap 11/19/2014 8  3 - 11 mEq/L Final  . EGFR 11/19/2014 87* >90 ml/min/1.73 m2 Final   eGFR is calculated using the CKD-EPI Creatinine Equation (2009)  Office Visit on 10/23/2014  Component Date Value Ref Range Status  . Specific Gravity, UA 10/23/2014 1.016  1.005 - 1.030 Final  . pH, UA 10/23/2014 5.5  5.0 - 7.5 Final  . Color, UA 10/23/2014 Yellow  Yellow Final  . Appearance Ur 10/23/2014 Cloudy* Clear Final  . Leukocytes, UA 10/23/2014 1+* Negative Final  .   Protein, UA 10/23/2014 Negative  Negative/Trace Final  . Glucose, UA 10/23/2014 Negative  Negative Final  . Ketones, UA 10/23/2014 Negative  Negative Final  . RBC, UA 10/23/2014 Negative  Negative Final  . Bilirubin, UA 10/23/2014 Negative  Negative Final  . Urobilinogen, Ur 10/23/2014 0.2  0.2 - 1.0 mg/dL Final  . Nitrite, UA 10/23/2014 Negative  Negative Final  . Urine Culture, Routine 10/23/2014 Final report   Final  . Result 1 10/23/2014 Comment   Final   Comment: Mixed urogenital flora 10,000-25,000 colony forming units per mL   . Color, UA 10/23/2014 yellow   In process  . Clarity, UA 10/23/2014 clear   In process  . Glucose, UA 10/23/2014 neg   In process  . Bilirubin, UA 10/23/2014 neg   In process   . Ketones, UA 10/23/2014 neg   In process  . Spec Grav, UA 10/23/2014 >=1.030   In process  . Blood, UA 10/23/2014 neg   In process  . pH, UA 10/23/2014 6.5   In process  . Protein, UA 10/23/2014 neg   In process  . Urobilinogen, UA 10/23/2014 0.2   In process  . Nitrite, UA 10/23/2014 neg   In process  . Leukocytes, UA 10/23/2014 Negative   In process    No results found.   Assessment/Plan    ICD-9-CM ICD-10-CM   1. Left ankle sprain, initial encounter 845.00 S93.402A DG Ankle Complete Left  R/o fracture   DG Foot Complete Left     Ambulatory referral to Orthopedic Surgery  2. Malignant glioma of brain 191.9 C71.9    --Continue ice as needed to left ankle  --Keep ankle elevated when seated  --Wear ACE bandage when ambulating  --Continue Tylenol as needed for pain  --follow up with Dr Green as scheduled   Monica S. Carter, D. O., F. A. C. O. I.  [facility] [address] [place], [place] [address] [phone] Cell (Monday-Friday 8 AM - 5 PM) [phone] After 5 PM and follow prompts  

## 2014-11-23 NOTE — Patient Instructions (Addendum)
Continue ice as needed to left ankle  Keep ankle elevated when seated  Wear ACE bandage when ambulating  Continue Tylenol as needed for pain  follow up with Dr Nyoka Cowden as scheduled  will call with referral and xray results

## 2014-11-26 ENCOUNTER — Ambulatory Visit: Payer: Medicare Other | Admitting: Occupational Therapy

## 2014-11-26 ENCOUNTER — Ambulatory Visit: Payer: Medicare Other | Admitting: Physical Therapy

## 2014-11-26 ENCOUNTER — Other Ambulatory Visit: Payer: Self-pay

## 2014-11-26 ENCOUNTER — Encounter: Payer: Self-pay | Admitting: Physical Therapy

## 2014-11-26 DIAGNOSIS — R29818 Other symptoms and signs involving the nervous system: Secondary | ICD-10-CM | POA: Diagnosis not present

## 2014-11-26 DIAGNOSIS — R29898 Other symptoms and signs involving the musculoskeletal system: Secondary | ICD-10-CM

## 2014-11-26 DIAGNOSIS — R2689 Other abnormalities of gait and mobility: Secondary | ICD-10-CM

## 2014-11-26 DIAGNOSIS — M25511 Pain in right shoulder: Secondary | ICD-10-CM

## 2014-11-26 DIAGNOSIS — R6889 Other general symptoms and signs: Secondary | ICD-10-CM

## 2014-11-26 DIAGNOSIS — R269 Unspecified abnormalities of gait and mobility: Secondary | ICD-10-CM

## 2014-11-26 DIAGNOSIS — R279 Unspecified lack of coordination: Secondary | ICD-10-CM

## 2014-11-26 NOTE — Therapy (Signed)
[facility] [address] [place], [place], [address] Phone: [phone]   Fax:  [phone]  Physical Therapy Treatment  Patient Details  Name: Kelly Fletcher MRN: [id number] Date of Birth: 10/07/1950 Referring Provider:  Estill Dooms, MD  Encounter Date: 11/26/2014      PT End of Session - 11/26/14 1024    Visit Number 10   Number of Visits 17   Date for PT Re-Evaluation 12/10/14   Authorization Type G-code & KX modifier   PT Start Time 1018   PT Stop Time 1059   PT Time Calculation (min) 41 min   Equipment Utilized During Treatment Gait belt   Activity Tolerance Patient tolerated treatment well   Behavior During Therapy [facility] for tasks assessed/performed      Past Medical History  Diagnosis Date  . Uterine prolapse   . Cystocele   . GERD (gastroesophageal reflux disease)   . Elevated cholesterol   . Rectocele   . Hypertension   . Arthritis   . Type II or unspecified type diabetes mellitus without mention of complication, uncontrolled     Type 2  . Urinary incontinence     Urgency  . Anxiety state, unspecified   . Palpitations   . Allergic rhinitis, cause unspecified   . Other and unspecified hyperlipidemia   . Unspecified hypothyroidism   . Edema   . Insomnia, unspecified   . Depressive disorder, not elsewhere classified   . Unspecified urinary incontinence   . Peptic ulcer, unspecified site, unspecified as acute or chronic, without mention of hemorrhage, perforation, or obstruction   . Disturbance of skin sensation   . Calculus of kidney   . Facial spasm 04/05/2014  . Abnormal brain MRI 04/13/2014  . 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 Monilial vaginitis 10/23/2014    Past Surgical History  Procedure Laterality Date  . Tonsillectomy and adenoidectomy  1963  . Dilation and curettage of uterus      3-05  . Hysteroscopy       3-05  . Endometrial biopsy      Dr.Gottsegen   . Tongue biopsy      There were no vitals filed for this visit.  Visit Diagnosis:  Abnormality of gait  Balance problems  Decreased functional activity tolerance  Right leg weakness      Subjective Assessment - 11/26/14 1022    Subjective No falls or pain to report. Did have a near fall at MD office, right shoe got stuck on carpet and she stepped laterally with left leg to catch balance, rolled left ankle. Saw MD, has 2 sprained ligaments, in a ankle support. Was cleared by ortho MD to particpate in therapy except for treadmill. Does not have to follow up with MD unless she is not getting better.                Currently in Pain? No/denies   Pain Score 0-No pain           [facility] PT Assessment - 11/26/14 1025    Functional Gait  Assessment   Gait Level Surface Walks 20 ft in less than 5.5 sec, no assistive devices, good speed, no evidence for imbalance, normal gait pattern, deviates no more than 6 in outside of the 12 in walkway width.   Change in Gait Speed Able to smoothly change walking speed without loss  of balance or gait deviation. Deviate no more than 6 in outside of the 12 in walkway width.   Gait with Horizontal Head Turns Performs head turns smoothly with no change in gait. Deviates no more than 6 in outside 12 in walkway width   Gait with Vertical Head Turns Performs head turns with no change in gait. Deviates no more than 6 in outside 12 in walkway width.   Gait and Pivot Turn Pivot turns safely within 3 sec and stops quickly with no loss of balance.   Step Over Obstacle Is able to step over one shoe box (4.5 in total height) without changing gait speed. No evidence of imbalance.   Gait with Narrow Base of Support Ambulates 7-9 steps.   Gait with Eyes Closed Walks 20 ft, uses assistive device, slower speed, mild gait deviations, deviates 6-10 in outside 12 in walkway width. Ambulates 20 ft in less than 9 sec but greater  than 7 sec.   Ambulating Backwards Walks 20 ft, uses assistive device, slower speed, mild gait deviations, deviates 6-10 in outside 12 in walkway width.   Steps Alternating feet, must use rail.  no rail up, 1 rail down   Total Score 25           [facility] Adult PT Treatment/Exercise - 11/26/14 1035    Ambulation/Gait   Ambulation/Gait Yes   Ambulation/Gait Assistance 5: Supervision;4: Min guard   Ambulation/Gait Assistance Details increased assist on compliant outdoor surfaces due to decreased foot clearance on right   Ambulation Distance (Feet) 700 Feet   Assistive device None   Gait Pattern Step-through pattern   Ambulation Surface Level;Unlevel;Indoor;Outdoor;Paved;Gravel;Grass   High Level Balance   High Level Balance Activities Marching forwards;Marching backwards;Tandem walking;Other (comment);Braiding  tandem fwd/bwd;heel/toe walk fwd/bwd, step overs fwd   High Level Balance Comments at counter on red mats x 3 laps each with up to min assist for balance and cues on posture/ex form.,            PT Short Term Goals - 11/12/14 1152    PT SHORT TERM GOAL #1   Title             PT Long Term Goals - 11/26/14 1254    PT LONG TERM GOAL #1   Title demonstrates / verbalizes understanding of ongoing HEP / fitness plan. (Target Date: 12/07/2014)   Time 8   Period Weeks   Status New   PT LONG TERM GOAL #2   Title Cognitive Timed Up & Go <15 sec safely  (Target Date: 12/07/2014)   Time 8   Period Weeks   Status New   PT LONG TERM GOAL #3   Title Berg Balance >/=52/56  (Target Date: 12/07/2014)   Time 8   Period Weeks   Status New   PT LONG TERM GOAL #4   Title Functional Gait Assessment >24/ 30  (Target Date: 12/07/2014)   Baseline 11/26/14: 25/30 scored today   Status Achieved   PT LONG TERM GOAL #5   Title ambulates >1000' without device including grass, ramps, curbs, stairs indpendently.  (Target Date: 12/07/2014)   Time 8   Period Weeks   Status New           Plan  - 11/26/14 1024    Clinical Impression Statement Pt continues to make steady progress with mobility towards her goals. Met LTG number 4 today.   Pt will benefit from skilled therapeutic intervention in order to improve on the following  deficits Abnormal gait;Decreased activity tolerance;Decreased balance;Decreased endurance;Decreased strength   Rehab Potential Good   PT Frequency 2x / week   PT Duration 8 weeks   PT Treatment/Interventions ADLs/[facility];DME Instruction;Gait training;Stair training;Functional mobility training;Therapeutic activities;Therapeutic exercise;Balance training;Neuromuscular re-education;Patient/family education   PT Next Visit Plan cont with strengthening and high level balance training   PT Home Exercise Plan hip abduction strengthening for bil. LE's   Consulted and Agree with Plan of Care Patient;Family member/caregiver   Family Member Consulted husband        Problem List Patient Active Problem List   Diagnosis Date Noted  . Monilial vaginitis 10/23/2014  . Deep vein thrombosis 08/12/2014  . History of inferior vena caval filter placement 07/14/2014  . Malignant glioma of brain 06/28/2014  . Diabetes mellitus, type 2 06/27/2014  . 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 Tremor of both hands 04/05/2014  . Vertigo 04/05/2014  . BPPV (benign paroxysmal positional vertigo) 03/14/2014  . Seizures 12/16/2013  . Epigastric pain 11/01/2013  . Fever blister 10/16/2013  . Unspecified hypothyroidism 12/13/2012  . Other and unspecified hyperlipidemia 12/13/2012  . Anxiety state, unspecified 12/13/2012  . Edema 12/13/2012  . Anxiety state 12/13/2012  . Adult hypothyroidism 12/13/2012  . Type II or unspecified type diabetes mellitus without mention of complication, not stated as uncontrolled   . Urinary incontinence   . DEPRESSION 09/24/2007  . Essential hypertension 09/24/2007  . GERD 09/24/2007  . HIATAL HERNIA 09/24/2007  .  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 Headache(784.0) 09/24/2007    Willow Ora 11/26/2014, 12:58 PM  Willow Ora, PTA, [facility] [address] [place], [place] [address] [phone] 11/26/2014, 12:58 PM

## 2014-11-26 NOTE — Therapy (Signed)
[facility] [address] [place], [place], [address] Phone: [phone]   Fax:  [phone]  Physical Therapy Treatment  Patient Details  Name: Kelly Fletcher MRN: [id number] Date of Birth: 05/22/1951 Referring Provider:  Estill Dooms, MD  Encounter Date: 11/26/2014      PT End of Session - 11/26/14 1024    Visit Number 10   Number of Visits 17   Date for PT Re-Evaluation 12/10/14   Authorization Type G-code & KX modifier   PT Start Time 1018   PT Stop Time 1059   PT Time Calculation (min) 41 min   Equipment Utilized During Treatment Gait belt   Activity Tolerance Patient tolerated treatment well   Behavior During Therapy [facility] for tasks assessed/performed      Past Medical History  Diagnosis Date  . Uterine prolapse   . Cystocele   . GERD (gastroesophageal reflux disease)   . Elevated cholesterol   . Rectocele   . Hypertension   . Arthritis   . Type II or unspecified type diabetes mellitus without mention of complication, uncontrolled     Type 2  . Urinary incontinence     Urgency  . Anxiety state, unspecified   . Palpitations   . Allergic rhinitis, cause unspecified   . Other and unspecified hyperlipidemia   . Unspecified hypothyroidism   . Edema   . Insomnia, unspecified   . Depressive disorder, not elsewhere classified   . Unspecified urinary incontinence   . Peptic ulcer, unspecified site, unspecified as acute or chronic, without mention of hemorrhage, perforation, or obstruction   . Disturbance of skin sensation   . Calculus of kidney   . Facial spasm 04/05/2014  . Abnormal brain MRI 04/13/2014  . 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 Monilial vaginitis 10/23/2014    Past Surgical History  Procedure Laterality Date  . Tonsillectomy and adenoidectomy  1963  . Dilation and curettage of uterus      3-05  . Hysteroscopy       3-05  . Endometrial biopsy      Dr.Gottsegen   . Tongue biopsy      There were no vitals filed for this visit.  Visit Diagnosis:  Abnormality of gait  Balance problems  Decreased functional activity tolerance  Right leg weakness      Subjective Assessment - 11/26/14 1022    Subjective No falls or pain to report. Did have a near fall at MD office, right shoe got stuck on carpet and she stepped laterally with left leg to catch balance, rolled left ankle. Saw MD, has 2 sprained ligaments, in a ankle support. Was cleared by ortho MD to particpate in therapy except for treadmill. Does not have to follow up with MD unless she is not getting better.                Currently in Pain? No/denies   Pain Score 0-No pain            [facility] PT Assessment - 11/26/14 1025    Functional Gait  Assessment   Gait Level Surface Walks 20 ft in less than 5.5 sec, no assistive devices, good speed, no evidence for imbalance, normal gait pattern, deviates no more than 6 in outside of the 12 in walkway width.   Change in Gait Speed Able to smoothly change walking speed without  loss of balance or gait deviation. Deviate no more than 6 in outside of the 12 in walkway width.   Gait with Horizontal Head Turns Performs head turns smoothly with no change in gait. Deviates no more than 6 in outside 12 in walkway width   Gait with Vertical Head Turns Performs head turns with no change in gait. Deviates no more than 6 in outside 12 in walkway width.   Gait and Pivot Turn Pivot turns safely within 3 sec and stops quickly with no loss of balance.   Step Over Obstacle Is able to step over one shoe box (4.5 in total height) without changing gait speed. No evidence of imbalance.   Gait with Narrow Base of Support Ambulates 7-9 steps.   Gait with Eyes Closed Walks 20 ft, uses assistive device, slower speed, mild gait deviations, deviates 6-10 in outside 12 in walkway width. Ambulates 20 ft in less than 9 sec but  greater than 7 sec.   Ambulating Backwards Walks 20 ft, uses assistive device, slower speed, mild gait deviations, deviates 6-10 in outside 12 in walkway width.   Steps Alternating feet, must use rail.  no rail up, 1 rail down   Total Score 25                     [facility] Adult PT Treatment/Exercise - 11/26/14 1035    Ambulation/Gait   Ambulation/Gait Yes   Ambulation/Gait Assistance 5: Supervision;4: Min guard   Ambulation/Gait Assistance Details increased assist on compliant outdoor surfaces due to decreased foot clearance on right   Ambulation Distance (Feet) 700 Feet   Assistive device None   Gait Pattern Step-through pattern   Ambulation Surface Level;Unlevel;Indoor;Outdoor;Paved;Gravel;Grass   High Level Balance   High Level Balance Activities Marching forwards;Marching backwards;Tandem walking;Other (comment);Braiding  tandem fwd/bwd;heel/toe walk fwd/bwd, step overs fwd   High Level Balance Comments at counter on red mats x 3 laps each with up to min assist for balance and cues on posture/ex form.,                  PT Short Term Goals - 11/12/14 1152    PT SHORT TERM GOAL #1   Title             PT Long Term Goals - 11/26/14 1254    PT LONG TERM GOAL #1   Title demonstrates / verbalizes understanding of ongoing HEP / fitness plan. (Target Date: 12/07/2014)   Time 8   Period Weeks   Status New   PT LONG TERM GOAL #2   Title Cognitive Timed Up & Go <15 sec safely  (Target Date: 12/07/2014)   Time 8   Period Weeks   Status New   PT LONG TERM GOAL #3   Title Berg Balance >/=52/56  (Target Date: 12/07/2014)   Time 8   Period Weeks   Status New   PT LONG TERM GOAL #4   Title Functional Gait Assessment >24/ 30  (Target Date: 12/07/2014)   Baseline 11/26/14: 25/30 scored today   Status Achieved   PT LONG TERM GOAL #5   Title ambulates >1000' without device including grass, ramps, curbs, stairs indpendently.  (Target Date: 12/07/2014)   Time 8   Period  Weeks   Status New               Plan - 11/26/14 1024    Clinical Impression Statement Pt continues to make steady progress with mobility towards her goals.  Met LTG number 4 today.   Pt will benefit from skilled therapeutic intervention in order to improve on the following deficits Abnormal gait;Decreased activity tolerance;Decreased balance;Decreased endurance;Decreased strength   Rehab Potential Good   PT Frequency 2x / week   PT Duration 8 weeks   PT Treatment/Interventions ADLs/[facility];DME Instruction;Gait training;Stair training;Functional mobility training;Therapeutic activities;Therapeutic exercise;Balance training;Neuromuscular re-education;Patient/family education   PT Next Visit Plan cont with strengthening and high level balance training   PT Home Exercise Plan hip abduction strengthening for bil. LE's   Consulted and Agree with Plan of Care Patient;Family member/caregiver   Family Member Consulted husband          G-Codes - 12/26/14 1615    Functional Assessment Tool Used Functional Gait Assessment 25/30   Functional Limitation Mobility: Walking and moving around   Mobility: Walking and Moving Around Current Status [phone]) At least 20 percent but less than 40 percent impaired, limited or restricted   Mobility: Walking and Moving Around Goal Status [phone]) At least 1 percent but less than 20 percent impaired, limited or restricted      Problem List Patient Active Problem List   Diagnosis Date Noted  . Monilial vaginitis 10/23/2014  . Deep vein thrombosis 08/12/2014  . History of inferior vena caval filter placement 07/14/2014  . Malignant glioma of brain 06/28/2014  . Diabetes mellitus, type 2 06/27/2014  . 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 Tremor of both hands 04/05/2014  . Vertigo 04/05/2014  . BPPV (benign paroxysmal positional vertigo) 03/14/2014  . Seizures 12/16/2013  . Epigastric pain 11/01/2013  . Fever blister 10/16/2013   . Unspecified hypothyroidism 12/13/2012  . Other and unspecified hyperlipidemia 12/13/2012  . Anxiety state, unspecified 12/13/2012  . Edema 12/13/2012  . Anxiety state 12/13/2012  . Adult hypothyroidism 12/13/2012  . Type II or unspecified type diabetes mellitus without mention of complication, not stated as uncontrolled   . Urinary incontinence   . DEPRESSION 09/24/2007  . Essential hypertension 09/24/2007  . GERD 09/24/2007  . HIATAL HERNIA 09/24/2007  . 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 Headache(784.0) 09/24/2007    [place], PT 12-26-2014, 4:16 PM  [facility] [address] [place], [place], [address] Phone: [phone]   Fax:  [phone]  Physical Therapy Progress Note  Dates of Reporting Period: 10-10-14 - 2014-12-26  Objective Reports of Subjective Statement:  Pt. Reports she is walking more and reports no falls today  Objective Measurements: see note above  Goal Update: see note above  Plan: cont. Per plan of care  Reason Skilled Services are Required:  Pt. Requires skilled PT services to address gait and balance deficits and to achieve LTG's

## 2014-11-26 NOTE — Therapy (Signed)
[facility] [address] [place], [place], [address] Phone: [phone]   Fax:  [phone]  Occupational Therapy Treatment  Patient Details  Name: Kelly Fletcher MRN: [id number] Date of Birth: [birth year] Referring Provider:  Estill Dooms, MD  Encounter Date: 11/26/2014      OT End of Session - 11/26/14 1205    Visit Number 11   Number of Visits 17   Date for OT Re-Evaluation 12/08/14   Authorization Type MCR - G code needed   Authorization Time Period 83 Days   Authorization - Visit Number 11   Authorization - Number of Visits 20      Past Medical History  Diagnosis Date  . Uterine prolapse   . Cystocele   . GERD (gastroesophageal reflux disease)   . Elevated cholesterol   . Rectocele   . Hypertension   . Arthritis   . Type II or unspecified type diabetes mellitus without mention of complication, uncontrolled     Type 2  . Urinary incontinence     Urgency  . Anxiety state, unspecified   . Palpitations   . Allergic rhinitis, cause unspecified   . Other and unspecified hyperlipidemia   . Unspecified hypothyroidism   . Edema   . Insomnia, unspecified   . Depressive disorder, not elsewhere classified   . Unspecified urinary incontinence   . Peptic ulcer, unspecified site, unspecified as acute or chronic, without mention of hemorrhage, perforation, or obstruction   . Disturbance of skin sensation   . Calculus of kidney   . Facial spasm 04/05/2014  . Abnormal brain MRI 04/13/2014  . 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 Monilial vaginitis 10/23/2014    Past Surgical History  Procedure Laterality Date  . Tonsillectomy and adenoidectomy  1963  . Dilation and curettage of uterus      3-05  . Hysteroscopy      3-05  . Endometrial biopsy      Dr.Gottsegen   . Tongue biopsy      There were no vitals filed for this visit.  Visit  Diagnosis:  Lack of coordination  Pain in joint, shoulder region, right      Subjective Assessment - 11/26/14 1109    Patient is accompained by: Family member  husband   Pertinent History glioblastoma with brain surgery to remove tumor 06/28/14, facial seizures   Patient Stated Goals To get my Rt shoulder and hand better b/c I'm Rt handed   Currently in Pain? Yes   Pain Score 0-No pain  up to 8/10 with higher ROM   Pain Location Shoulder   Pain Orientation Right   Pain Descriptors / Indicators Sore   Pain Type Chronic pain   Pain Onset More than a month ago   Pain Frequency Intermittent   Aggravating Factors  raising arm above 70* of flexion   Pain Relieving Factors low reach, ice, heat, positioning, taping                      OT Treatments/Exercises (OP) - 11/26/14 0001    ADLs   ADL Comments Discussed coordination activities issued (see pt instructions) and to stop if pt has shoulder pain and how to modify activity to prevent sh. pain. Pt/husband agreed. Also discussed practicing eating with Rt hand only with upper arm supported to prevent sh. pain and to stop  if shoulder pain occurs; pt/husband agreed. Pt instructed to start with finger foods and foods that will stay on the utensil. Pt/husband instructed that they can build up eating utensil prn with red foam provided today   Fine Motor Coordination   Other Fine Motor Exercises see pt instructions -pt practiced all with some modifciations/compensations needed   Other Fine Motor Exercises Practiced writing with various style pens, however pt still with limited control for functional writing. Practiced tracing shapes and lines with highlighter. Pt/husband also provided with red foam to place on writing utensil.                 OT Education - 11/26/14 1154    Education provided Yes   Education Details coordination HEP   Person(s) Educated Patient;Spouse   Methods Explanation;Demonstration;Handout    Comprehension Verbalized understanding;Returned demonstration          OT Short Term Goals - 11/22/14 1524    OT SHORT TERM GOAL #1   Title Independent with HEP for Rt hand coordination, RUE ROM and Rt hand strength (DUE 11/08/14)   Time 4   Period Weeks   Status Not Met  Pt has had severe pain in R shoulder therefore has not met any STG's as treatment has needed to focus on addressing pain   OT SHORT TERM GOAL #2   Title Pt to verbalize understanding with pain management strategies RUE   Time 4   Period Weeks   Status Partially Met  able to verbalize some positioning and movement strategies   OT SHORT TERM GOAL #3   Title Pt to write name at 75% or greater legibility w/ A/E for pen prn Rt dominant hand   Baseline approx. 25% first name only with built up pen   Time 4   Period Weeks   Status Not Met   OT SHORT TERM GOAL #4   Title Improve RUE functional use as evidenced by performing 25 blocks or greater on Box & Blocks test   Baseline eval = 17   Time 4   Period Weeks   Status Not Met   OT SHORT TERM GOAL #5   Title Pt to improve grip strength Rt hand to 20 lbs or greater to decrease drops   Baseline eval = 10 lbs (Lt = 55 lbs)   Time 4   Period Weeks   Status Not Met   OT SHORT TERM GOAL #6   Title Pt to tie shoes and hook bra mod I level incorporating Rt dominant hand for assist   Baseline dependent   Time 4   Period Weeks   Status Not Met   OT SHORT TERM GOAL #7   Title Pt to use Rt dominant hand to feed [place] and groom [place] 50% of the time    Baseline none with Rt hand, using Lt non dominant hand   Time 4   Period Weeks   Status Not Met           OT Long Term Goals - 11/22/14 1524    OT LONG TERM GOAL #1   Title Independent with updated HEP for RUE strength (DUE 12/08/14)   Time 8   Period Weeks   Status Deferred  ALL LTG's will be deferred until renewal period secondary to pain in Rt shoulder limiting ability to work towards these goals. To work on [employer]  as able and revise prn for renewal period in July   [place] #2  Title Pt to perform high level reaching RUE to retrieve/replace 3 lb. object from high shelf 10 times w/o rest   Baseline only 75% shoulder flexion   Time 8   Period Weeks   Status Deferred   OT LONG TERM GOAL #3   Title Improve RUE functional use as evidenced by performing 40 blocks on Box & Blocks test    Baseline eval = 17   Time 8   Period Weeks   Status Deferred   OT LONG TERM GOAL #4   Title Improve coordination as evidenced by performing 9 hole peg test in under 90 sec.    Baseline eval: only able to place 1 peg in 60 sec.    Time 8   Period Weeks   Status Deferred   OT LONG TERM GOAL #5   Title Pt to improve grip strength Rt dominant hand to 30 lbs or greater to open jars/containers   Baseline eval = 10 lbs   Time 8   Period Weeks   Status Deferred   OT LONG TERM GOAL #6   Title Pt to return to using Rt hand as dominant hand for all BADLS 85% of the time or greater   Baseline only using LUE   Time 8   Period Weeks   Status Deferred   OT LONG TERM GOAL #7   Title Pt to write short paragraph with Rt dominant hand using A/E prn with 90% legibility    Baseline 25% legibility for first name only   Time 8   Period Weeks   Status Deferred   OT LONG TERM GOAL #8   Title Pt to return to light cooking/cleaning tasks mod I level with A/E and/or DME prn   Baseline dependent   Time 8   Period Weeks   Status Deferred               Plan - 11/26/14 1206    Clinical Impression Statement Pt with limited coordination and dexterity due to lack of control, decreased sensation, and thumb slipping from grasp. Pt wants to improve this   Clinical Impairments Affecting Rehab Potential pain RUE   Plan Continue shoulder neuro re-education, monitor and address shoulder pain prn   Consulted and Agree with Plan of Care Patient;Family member/caregiver   Family Member Consulted HUSBAND        Problem  List Patient Active Problem List   Diagnosis Date Noted  . Monilial vaginitis 10/23/2014  . Deep vein thrombosis 08/12/2014  . History of inferior vena caval filter placement 07/14/2014  . Malignant glioma of brain 06/28/2014  . Diabetes mellitus, type 2 06/27/2014  . 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 Tremor of both hands 04/05/2014  . Vertigo 04/05/2014  . BPPV (benign paroxysmal positional vertigo) 03/14/2014  . Seizures 12/16/2013  . Epigastric pain 11/01/2013  . Fever blister 10/16/2013  . Unspecified hypothyroidism 12/13/2012  . Other and unspecified hyperlipidemia 12/13/2012  . Anxiety state, unspecified 12/13/2012  . Edema 12/13/2012  . Anxiety state 12/13/2012  . Adult hypothyroidism 12/13/2012  . Type II or unspecified type diabetes mellitus without mention of complication, not stated as uncontrolled   . Urinary incontinence   . DEPRESSION 09/24/2007  . Essential hypertension 09/24/2007  . GERD 09/24/2007  . HIATAL HERNIA 09/24/2007  . 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 Headache(784.0) 09/24/2007    Carey Bullocks, OTR/L 11/26/2014, 12:08 PM  [facility] [address] [place], [place], [address]  Phone: [phone]   Fax:  [phone]

## 2014-11-26 NOTE — Patient Instructions (Signed)
  Coordination Activities  Perform the following activities for 10 minutes 1-2 times per day with right hand(s).   Flip cards 1 at a time.  Pick up coins or checkers, dice, large buttons, marbles, dominoes, cotton ball, shower rings, and/or dried penne pasta of different sizes and place in container.  Practice writing or tracing over lines, shapes, letters as able

## 2014-11-28 ENCOUNTER — Ambulatory Visit: Payer: Medicare Other | Admitting: Occupational Therapy

## 2014-11-28 ENCOUNTER — Ambulatory Visit: Payer: Medicare Other | Admitting: Physical Therapy

## 2014-11-28 ENCOUNTER — Ambulatory Visit: Payer: Medicare Other

## 2014-11-28 ENCOUNTER — Encounter: Payer: Self-pay | Admitting: Physical Therapy

## 2014-11-28 ENCOUNTER — Encounter: Payer: Self-pay | Admitting: Occupational Therapy

## 2014-11-28 DIAGNOSIS — R2689 Other abnormalities of gait and mobility: Secondary | ICD-10-CM

## 2014-11-28 DIAGNOSIS — M25511 Pain in right shoulder: Secondary | ICD-10-CM

## 2014-11-28 DIAGNOSIS — G8191 Hemiplegia, unspecified affecting right dominant side: Secondary | ICD-10-CM

## 2014-11-28 DIAGNOSIS — R4701 Aphasia: Secondary | ICD-10-CM

## 2014-11-28 DIAGNOSIS — R4189 Other symptoms and signs involving cognitive functions and awareness: Secondary | ICD-10-CM

## 2014-11-28 DIAGNOSIS — R6889 Other general symptoms and signs: Secondary | ICD-10-CM

## 2014-11-28 DIAGNOSIS — R29898 Other symptoms and signs involving the musculoskeletal system: Secondary | ICD-10-CM

## 2014-11-28 DIAGNOSIS — R29818 Other symptoms and signs involving the nervous system: Secondary | ICD-10-CM | POA: Diagnosis not present

## 2014-11-28 DIAGNOSIS — R269 Unspecified abnormalities of gait and mobility: Secondary | ICD-10-CM

## 2014-11-28 NOTE — Therapy (Signed)
[facility] [address] [place], [place], [address] Phone: [phone]   Fax:  [phone]  Speech Language Pathology Treatment  Patient Details  Name: Kelly Fletcher MRN: [id number] Date of Birth: 04-01-1951 Referring Provider:  Estill Dooms, MD  Encounter Date: 11/28/2014      End of Session - 11/28/14 1009    Visit Number 10   Number of Visits 16   Date for SLP Re-Evaluation 12/08/14   SLP Start Time 0934   SLP Stop Time  1015   SLP Time Calculation (min) 41 min   Activity Tolerance Patient tolerated treatment well      Past Medical History  Diagnosis Date  . Uterine prolapse   . Cystocele   . GERD (gastroesophageal reflux disease)   . Elevated cholesterol   . Rectocele   . Hypertension   . Arthritis   . Type II or unspecified type diabetes mellitus without mention of complication, uncontrolled     Type 2  . Urinary incontinence     Urgency  . Anxiety state, unspecified   . Palpitations   . Allergic rhinitis, cause unspecified   . Other and unspecified hyperlipidemia   . Unspecified hypothyroidism   . Edema   . Insomnia, unspecified   . Depressive disorder, not elsewhere classified   . Unspecified urinary incontinence   . Peptic ulcer, unspecified site, unspecified as acute or chronic, without mention of hemorrhage, perforation, or obstruction   . Disturbance of skin sensation   . Calculus of kidney   . Facial spasm 04/05/2014  . Abnormal brain MRI 04/13/2014  . 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 Monilial vaginitis 10/23/2014    Past Surgical History  Procedure Laterality Date  . Tonsillectomy and adenoidectomy  1963  . Dilation and curettage of uterus      3-05  . Hysteroscopy      3-05  . Endometrial biopsy      Dr.Gottsegen   . Tongue biopsy      There were no vitals filed for this visit.  Visit Diagnosis:  Aphasia      Subjective Assessment - 11/28/14 0947    Subjective Pt reports her speech seems better than last week.               ADULT SLP TREATMENT - 11/28/14 0948    General Information   Behavior/Cognition Alert;Cooperative;Pleasant mood   Treatment Provided   Treatment provided Cognitive-Linquistic   Pain Assessment   Pain Assessment No/denies pain   Pain Score 2    Pain Location lt ankle   Pain Descriptors / Indicators Aching   Pain Intervention(s) Monitored during session   Cognitive-Linquistic Treatment   Treatment focused on Apraxia   Skilled Treatment HEP completed with approx 85% success. In simple to mod-complex conversation SLP utilized skilled observation/listening to ID that pt remanined with functional/normal language/speech 90% of the time. As conversation progressed past 10 minutes, pt experienced  more frequent apraxic episodes. In 2 minute conversations to improve pt's ability to use compensations in conversation - pt reported average 8.25/10 (10=normal talking).    Assessment / Recommendations / Plan   Plan Continue with current plan of care   Progression Toward Goals   Progression toward goals Progressing toward goals          SLP Education - 11/28/14 1009    Education provided Yes  Education Details compensations for speech (slowed rate) needed more ofen in conversation   Person(s) Educated Patient;Spouse   Methods Explanation;Demonstration   Comprehension Verbalized understanding;Returned demonstration          SLP Short Term Goals - 11/21/14 1235    SLP SHORT TERM GOAL #1   Title pt will complete HEP for motor speech planning with slow rate 95% success with rare verbal cues   Time 1   Period Weeks   Status Achieved   SLP SHORT TERM GOAL #2   Title pt will produce sentences using speech compensations with 85% fluency   Time 1   Status Achieved   SLP SHORT TERM GOAL #3   Title pt will tell SLP three compensations for improving motor  planning for speech   Time 1   Status Achieved   SLP SHORT TERM GOAL #4   Title pt to participate in 5 minutes simple conversation with 85% fluency with modified independence   Time 1   Period Weeks   Status Achieved          SLP Long Term Goals - 2014-12-06 1014    SLP LONG TERM GOAL #1   Title pt will participate in 10 mintues mod complex conversation 90% fluency, with modified independence (compensations)   Status Achieved   SLP LONG TERM GOAL #2   Title pt will complete HEP with 90% fluency and modifed independence (compensations)   Time --   Period --   Status Achieved   SLP LONG TERM GOAL #3   Title pt will participate in 7 minutes complex conversation with 85% fluency with modified independence   Time 2   Period Weeks   Status On-going          Plan - 12-06-2014 1010    Clinical Impression Statement SLP is req'd to cont to provide cues for pt to use speech compensations in structured tasks as well as in conversation.   Speech Therapy Frequency 1x /week   Duration 2 weeks   Treatment/Interventions Cueing hierarchy;Functional tasks;Patient/family education;Compensatory strategies;SLP instruction and feedback   Potential to Achieve Goals Fair   Potential Considerations Co-morbidities;Severity of impairments      Speech Therapy Progress Note  Dates of Reporting Period: 10-09-14 to 11-27-14   Objective Reports of Subjective Statement:  Pt has begun using speech compensations for more fluent speech, and has progressed to level of simple to mod complex conversation. However, pt cont to require SLP cues to maintain her use of intelligibility compensations over the course of a short conversation > 4-5 minutes.   The last two weeks, pt has requested decr in ST frequency to focus more therapy on her RUE use.   Goal Update:  See above in "SLP short term goals" and "SLP long term goals"   Plan:  Pt will continue for approx one-two more sessions to habitualize using  compensatory speech strategies to improve intelligibility and increase fluent speech.         G-Codes - 12/06/2014 1016    Functional Limitations Motor speech   Motor Speech Current Status [phone]) At least 20 percent but less than 40 percent impaired, limited or restricted   Motor Speech Goal Status (H8469) At least 20 percent but less than 40 percent impaired, limited or restricted      Problem List Patient Active Problem List   Diagnosis Date Noted  . Monilial vaginitis 10/23/2014  . Deep vein thrombosis 08/12/2014  . History of inferior vena caval filter placement  07/14/2014  . Malignant glioma of brain 06/28/2014  . Diabetes mellitus, type 2 06/27/2014  . 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 Tremor of both hands 04/05/2014  . Vertigo 04/05/2014  . BPPV (benign paroxysmal positional vertigo) 03/14/2014  . Seizures 12/16/2013  . Epigastric pain 11/01/2013  . Fever blister 10/16/2013  . Unspecified hypothyroidism 12/13/2012  . Other and unspecified hyperlipidemia 12/13/2012  . Anxiety state, unspecified 12/13/2012  . Edema 12/13/2012  . Anxiety state 12/13/2012  . Adult hypothyroidism 12/13/2012  . Type II or unspecified type diabetes mellitus without mention of complication, not stated as uncontrolled   . Urinary incontinence   . DEPRESSION 09/24/2007  . Essential hypertension 09/24/2007  . GERD 09/24/2007  . HIATAL HERNIA 09/24/2007  . 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 Headache(784.0) 09/24/2007    SCHINKE,CARL , [place], Raoul  11/28/2014, 10:17 AM  [facility] [address] [place], [place], [address] Phone: [phone]   Fax:  [phone]

## 2014-11-28 NOTE — Therapy (Signed)
[facility] [address] [place], [place], [address] Phone: [phone]   Fax:  [phone]  Physical Therapy Treatment  Patient Details  Name: Kelly Fletcher MRN: [id number] Date of Birth: 1950/11/29 Referring Provider:  Estill Dooms, MD  Encounter Date: 11/28/2014      PT End of Session - 11/28/14 1107    Visit Number 11   Number of Visits 17   Date for PT Re-Evaluation 12/10/14   Authorization Type G-code & KX modifier   PT Start Time 1016   PT Stop Time 1100   PT Time Calculation (min) 44 min   Equipment Utilized During Treatment Gait belt   Activity Tolerance Patient tolerated treatment well   Behavior During Therapy [facility] for tasks assessed/performed      Past Medical History  Diagnosis Date  . Uterine prolapse   . Cystocele   . GERD (gastroesophageal reflux disease)   . Elevated cholesterol   . Rectocele   . Hypertension   . Arthritis   . Type II or unspecified type diabetes mellitus without mention of complication, uncontrolled     Type 2  . Urinary incontinence     Urgency  . Anxiety state, unspecified   . Palpitations   . Allergic rhinitis, cause unspecified   . Other and unspecified hyperlipidemia   . Unspecified hypothyroidism   . Edema   . Insomnia, unspecified   . Depressive disorder, not elsewhere classified   . Unspecified urinary incontinence   . Peptic ulcer, unspecified site, unspecified as acute or chronic, without mention of hemorrhage, perforation, or obstruction   . Disturbance of skin sensation   . Calculus of kidney   . Facial spasm 04/05/2014  . Abnormal brain MRI 04/13/2014  . 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 Monilial vaginitis 10/23/2014    Past Surgical History  Procedure Laterality Date  . Tonsillectomy and adenoidectomy  1963  . Dilation and curettage of uterus      3-05  . Hysteroscopy       3-05  . Endometrial biopsy      Dr.Gottsegen   . Tongue biopsy      There were no vitals filed for this visit.  Visit Diagnosis:  Abnormality of gait  Balance problems  Decreased functional activity tolerance  Right leg weakness      Subjective Assessment - 11/28/14 1108    Subjective Reports soreness in L hip/lateral thigh from last session. No falls since last session.   Patient is accompained by: Family member   Currently in Pain? Yes   Pain Score 2    Pain Location Shoulder   Pain Orientation Right   Pain Descriptors / Indicators Sore   Pain Type Chronic pain   Pain Onset More than a month ago   Pain Frequency Intermittent   Multiple Pain Sites Yes   Pain Score 2   Pain Location Hip   Pain Orientation Left   Pain Descriptors / Indicators Sore   Pain Type Other (Comment)  sore L hip/lateral thigh from last session   Pain Onset In the past 7 days              [facility] Adult PT Treatment/Exercise - 11/28/14 1115    Ambulation/Gait   Ambulation/Gait Yes   Ambulation/Gait Assistance 5: Supervision;4: Min guard   Ambulation/Gait Assistance Details Cues for reciprocal arm swing on  R. Increased assist on outdoor surfaces due to decreased R foot clearance.   Ambulation Distance (Feet) 920 Feet   Assistive device None   Gait Pattern Step-through pattern   Ambulation Surface Level;Unlevel;Indoor;Outdoor;Paved;Gravel;Grass     Neuro Re-ed (min guard with occasional min assist to correct LOB especially with SLS on R; cues for posture, sequencing, and ex form): -SLS level surface at foot of stairs: tapping 3 steps up/down (2x10 each side)  -Parallel bars on foam beam: tandem walking forwards/backwards x3 laps; side stepping x3 laps; alt toe taps to front/back standing on foam beam 2x10 each ex -On red mat: alt tapping cones with side stepping x2 laps; kicking over cone and bringing back up x2 laps each foot; figure 8 through cones with alt toe taps at each cone x2  laps            PT Short Term Goals - 11/12/14 1152    PT SHORT TERM GOAL #1   Title             PT Long Term Goals - 11/28/14 1134    PT LONG TERM GOAL #1   Title demonstrates / verbalizes understanding of ongoing HEP / fitness plan. (Target Date: 12/07/2014)   Time 8   Period Weeks   Status New   PT LONG TERM GOAL #2   Title Cognitive Timed Up & Go <15 sec safely  (Target Date: 12/07/2014)   Time 8   Period Weeks   Status New   PT LONG TERM GOAL #3   Title Berg Balance >/=52/56  (Target Date: 12/07/2014)   Time 8   Period Weeks   Status New   PT LONG TERM GOAL #4   Title Functional Gait Assessment >24/ 30  (Target Date: 12/07/2014)   Baseline 11/26/14: 25/30 scored today   Status Achieved   PT LONG TERM GOAL #5   Title ambulates >1000' without device including grass, ramps, curbs, stairs indpendently.  (Target Date: 12/07/2014)   Time 8   Period Weeks   Status New            Plan - 11/28/14 1136    Clinical Impression Statement Challenged with high level balance activities on compliant surfaces and SLS on R LE. Continuing to make progress toward goals.   Pt will benefit from skilled therapeutic intervention in order to improve on the following deficits Abnormal gait;Decreased activity tolerance;Decreased balance;Decreased endurance;Decreased strength   Rehab Potential Good   PT Frequency 2x / week   PT Duration 8 weeks   PT Treatment/Interventions ADLs/[facility];DME Instruction;Gait training;Stair training;Functional mobility training;Therapeutic activities;Therapeutic exercise;Balance training;Neuromuscular re-education;Patient/family education   PT Next Visit Plan cont with strengthening and high level balance training   PT Home Exercise Plan hip abduction strengthening for bil. LE's   Consulted and Agree with Plan of Care Patient;Family member/caregiver   Family Member Consulted husband        Problem List Patient Active Problem List    Diagnosis Date Noted  . Monilial vaginitis 10/23/2014  . Deep vein thrombosis 08/12/2014  . History of inferior vena caval filter placement 07/14/2014  . Malignant glioma of brain 06/28/2014  . Diabetes mellitus, type 2 06/27/2014  . 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 Tremor of both hands 04/05/2014  . Vertigo 04/05/2014  . BPPV (benign paroxysmal positional vertigo) 03/14/2014  . Seizures 12/16/2013  . Epigastric pain 11/01/2013  . Fever blister 10/16/2013  . Unspecified hypothyroidism 12/13/2012  . Other and unspecified  hyperlipidemia 12/13/2012  . Anxiety state, unspecified 12/13/2012  . Edema 12/13/2012  . Anxiety state 12/13/2012  . Adult hypothyroidism 12/13/2012  . Type II or unspecified type diabetes mellitus without mention of complication, not stated as uncontrolled   . Urinary incontinence   . DEPRESSION 09/24/2007  . Essential hypertension 09/24/2007  . GERD 09/24/2007  . HIATAL HERNIA 09/24/2007  . 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 Headache(784.0) 09/24/2007    Rubye Oaks 11/28/2014, 12:57 PM  [place], [place]

## 2014-11-28 NOTE — Patient Instructions (Signed)
Begin to take stock more often of how your rate of speech is!!

## 2014-11-28 NOTE — Therapy (Signed)
[facility] [address] [place], [place], [address] Phone: [phone]   Fax:  [phone]  Occupational Therapy Treatment  Patient Details  Name: Kelly Fletcher MRN: [id number] Date of Birth: 1951-05-07 Referring Provider:  Estill Dooms, MD  Encounter Date: 11/28/2014      OT End of Session - 11/28/14 1250    Visit Number 12   Number of Visits 17   Date for OT Re-Evaluation 12/08/14   Authorization Type MCR - G code needed   OT Start Time 1100   OT Stop Time 1144   OT Time Calculation (min) 44 min   Activity Tolerance Patient tolerated treatment well      Past Medical History  Diagnosis Date  . Uterine prolapse   . Cystocele   . GERD (gastroesophageal reflux disease)   . Elevated cholesterol   . Rectocele   . Hypertension   . Arthritis   . Type II or unspecified type diabetes mellitus without mention of complication, uncontrolled     Type 2  . Urinary incontinence     Urgency  . Anxiety state, unspecified   . Palpitations   . Allergic rhinitis, cause unspecified   . Other and unspecified hyperlipidemia   . Unspecified hypothyroidism   . Edema   . Insomnia, unspecified   . Depressive disorder, not elsewhere classified   . Unspecified urinary incontinence   . Peptic ulcer, unspecified site, unspecified as acute or chronic, without mention of hemorrhage, perforation, or obstruction   . Disturbance of skin sensation   . Calculus of kidney   . Facial spasm 04/05/2014  . Abnormal brain MRI 04/13/2014  . 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 Monilial vaginitis 10/23/2014    Past Surgical History  Procedure Laterality Date  . Tonsillectomy and adenoidectomy  1963  . Dilation and curettage of uterus      3-05  . Hysteroscopy      3-05  . Endometrial biopsy      Dr.Gottsegen   . Tongue biopsy      There were no vitals filed  for this visit.  Visit Diagnosis:  Pain in joint, shoulder region, right  Weakness of right arm  Cognitive deficits  Hemiplegia affecting right dominant side      Subjective Assessment - 11/28/14 1102    Subjective  I have some pain in my shoulder (pt is pointing to all soft tissue/muscle soreness no true joint pain)   Patient is accompained by: Family member   Pertinent History glioblastoma with brain surgery to remove tumor 06/28/14, facial seizures   Patient Stated Goals To get my Rt shoulder and hand better b/c I'm Rt handed   Currently in Pain? Yes   Pain Score 2    Pain Location Shoulder   Pain Orientation Right   Pain Descriptors / Indicators Sore   Pain Type Chronic pain   Pain Onset More than a month ago   Pain Frequency Intermittent   Aggravating Factors  wakes up with it; thinks it is related to positioning at night - reinforced positioning with patient   Pain Relieving Factors low reach, ice, heat, positiong,    Multiple Pain Sites No                      OT Treatments/Exercises (OP) - 11/28/14 0001    Neurological Re-education Exercises  Other Exercises 1 Neuro re ed in standing to address mid to high level reach with grasping, orienting and placing light object on lower shelf.  Pt required min facilitation for reach, and mod facilitation to manipulate object in hand to orient to surface.  Pt requires max vc's and encouragement to focus on what she can do vs what she can't.  Pt and husband also given activity to work on isolated finger movement at home. Pt does not use RUE to full possible extent due to sensory loss, apraxia, potential for pain, and cogntiive deficits.                   OT Short Term Goals - 11/28/14 1248    OT SHORT TERM GOAL #1   Title Independent with HEP for Rt hand coordination, RUE ROM and Rt hand strength (DUE 11/08/14)   Time 4   Period Weeks   Status Not Met  Pt has had severe pain in R shoulder therefore has not  met any STG's as treatment has needed to focus on addressing pain   OT SHORT TERM GOAL #2   Title Pt to verbalize understanding with pain management strategies RUE   Time 4   Period Weeks   Status Partially Met  able to verbalize some positioning and movement strategies   OT SHORT TERM GOAL #3   Title Pt to write name at 75% or greater legibility w/ A/E for pen prn Rt dominant hand   Baseline approx. 25% first name only with built up pen   Time 4   Period Weeks   Status Not Met   OT SHORT TERM GOAL #4   Title Improve RUE functional use as evidenced by performing 25 blocks or greater on Box & Blocks test   Baseline eval = 17   Time 4   Period Weeks   Status Not Met   OT SHORT TERM GOAL #5   Title Pt to improve grip strength Rt hand to 20 lbs or greater to decrease drops   Baseline eval = 10 lbs (Lt = 55 lbs)   Time 4   Period Weeks   Status Not Met   OT SHORT TERM GOAL #6   Title Pt to tie shoes and hook bra mod I level incorporating Rt dominant hand for assist   Baseline dependent   Time 4   Period Weeks   Status Not Met   OT SHORT TERM GOAL #7   Title Pt to use Rt dominant hand to feed Ten[place] and groom Behrmann 50% of the time    Baseline none with Rt hand, using Lt non dominant hand   Time 4   Period Weeks   Status Not Met           OT Long Term Goals - 11/28/14 1248    OT LONG TERM GOAL #1   Title Independent with updated HEP for RUE strength (DUE 12/08/14)   Time 8   Period Weeks   Status Deferred  ALL LTG's will be deferred until renewal period secondary to pain in Rt shoulder limiting ability to work towards these goals. To work on [employer] as able and revise prn for renewal period in July   [place] #2   Title Pt to perform high level reaching RUE to retrieve/replace 3 lb. object from high shelf 10 times w/o rest   Baseline only 75% shoulder flexion   Time 8   Period Weeks  Status Deferred   OT LONG TERM GOAL #3   Title Improve RUE functional use as  evidenced by performing 40 blocks on Box & Blocks test    Baseline eval = 17   Time 8   Period Weeks   Status Deferred   OT LONG TERM GOAL #4   Title Improve coordination as evidenced by performing 9 hole peg test in under 90 sec.    Baseline eval: only able to place 1 peg in 60 sec.    Time 8   Period Weeks   Status Deferred   OT LONG TERM GOAL #5   Title Pt to improve grip strength Rt dominant hand to 30 lbs or greater to open jars/containers   Baseline eval = 10 lbs   Time 8   Period Weeks   Status Deferred   OT LONG TERM GOAL #6   Title Pt to return to using Rt hand as dominant hand for all BADLS 81% of the time or greater   Baseline only using LUE   Time 8   Period Weeks   Status Deferred   OT LONG TERM GOAL #7   Title Pt to write short paragraph with Rt dominant hand using A/E prn with 90% legibility    Baseline 25% legibility for first name only   Time 8   Period Weeks   Status Deferred   OT LONG TERM GOAL #8   Title Pt to return to light cooking/cleaning tasks mod I level with A/E and/or DME prn   Baseline dependent   Time 8   Period Weeks   Status Deferred               Plan - 11/28/14 1248    Clinical Impression Statement Pt continues to make gains in functional reach, grasp and release, and pain reduction. Discussed and practiced ways to incorporate RUE into ADL's and to use vision to compensate for sensory loss   Pt will benefit from skilled therapeutic intervention in order to improve on the following deficits (Retired) Decreased coordination;Decreased range of motion;Difficulty walking;Decreased safety awareness;Decreased endurance;Increased edema;Impaired sensation;Decreased activity tolerance;Decreased knowledge of precautions;Impaired tone;Decreased balance;Decreased knowledge of use of DME;Impaired UE functional use;Pain;Decreased cognition;Decreased mobility;Decreased strength   Rehab Potential Good   Clinical Impairments Affecting Rehab Potential  pain RUE   OT Frequency 2x / week   OT Duration 8 weeks   OT Treatment/Interventions [place]lee-care/ADL training;Fluidtherapy;Moist Heat;DME and/or AE instruction;Splinting;Patient/family education;Therapeutic exercises;Balance training;Therapeutic activities;Neuromuscular education;Functional Mobility Training;Passive range of motion;Cognitive remediation/compensation;Electrical Stimulation;[place]rafin;Energy conservation;Manual Therapy   Plan neuro re ed to RUE, encourage full functional use, address shoulder pain prn   Consulted and Agree with Plan of Care Patient;Family member/caregiver   Family Member Consulted HUSBAND        Problem List Patient Active Problem List   Diagnosis Date Noted  . Monilial vaginitis 10/23/2014  . Deep vein thrombosis 08/12/2014  . History of inferior vena caval filter placement 07/14/2014  . Malignant glioma of brain 06/28/2014  . Diabetes mellitus, type 2 06/27/2014  . 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 Tremor of both hands 04/05/2014  . Vertigo 04/05/2014  . BPPV (benign paroxysmal positional vertigo) 03/14/2014  . Seizures 12/16/2013  . Epigastric pain 11/01/2013  . Fever blister 10/16/2013  . Unspecified hypothyroidism 12/13/2012  . Other and unspecified hyperlipidemia 12/13/2012  . Anxiety state, unspecified 12/13/2012  . Edema 12/13/2012  . Anxiety state 12/13/2012  . Adult hypothyroidism 12/13/2012  . Type II or unspecified type diabetes mellitus  without mention of complication, not stated as uncontrolled   . Urinary incontinence   . DEPRESSION 09/24/2007  . Essential hypertension 09/24/2007  . GERD 09/24/2007  . HIATAL HERNIA 09/24/2007  . 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 Headache(784.0) 09/24/2007    Quay Burow, OTR/L 11/28/2014, 12:51 PM  [facility] [address] [place], [place], [address] Phone: [phone]   Fax:  [phone]

## 2014-11-30 ENCOUNTER — Telehealth: Payer: Self-pay | Admitting: Oncology

## 2014-11-30 ENCOUNTER — Other Ambulatory Visit: Payer: Self-pay | Admitting: *Deleted

## 2014-11-30 DIAGNOSIS — C719 Malignant neoplasm of brain, unspecified: Secondary | ICD-10-CM

## 2014-11-30 NOTE — Telephone Encounter (Signed)
Pt confirmed labs for 07/05 per 07/01 POF.... Cherylann Banas

## 2014-12-04 ENCOUNTER — Ambulatory Visit: Payer: Medicare Other

## 2014-12-04 ENCOUNTER — Ambulatory Visit: Payer: Medicare Other | Attending: Radiology | Admitting: Physical Therapy

## 2014-12-04 ENCOUNTER — Encounter: Payer: Medicare Other | Admitting: Occupational Therapy

## 2014-12-04 ENCOUNTER — Ambulatory Visit: Payer: Medicare Other | Admitting: Occupational Therapy

## 2014-12-04 ENCOUNTER — Encounter: Payer: Self-pay | Admitting: Physical Therapy

## 2014-12-04 ENCOUNTER — Other Ambulatory Visit (HOSPITAL_BASED_OUTPATIENT_CLINIC_OR_DEPARTMENT_OTHER): Payer: Medicare Other

## 2014-12-04 ENCOUNTER — Encounter: Payer: Self-pay | Admitting: Occupational Therapy

## 2014-12-04 DIAGNOSIS — G8191 Hemiplegia, unspecified affecting right dominant side: Secondary | ICD-10-CM

## 2014-12-04 DIAGNOSIS — R4189 Other symptoms and signs involving cognitive functions and awareness: Secondary | ICD-10-CM | POA: Insufficient documentation

## 2014-12-04 DIAGNOSIS — R2681 Unsteadiness on feet: Secondary | ICD-10-CM | POA: Insufficient documentation

## 2014-12-04 DIAGNOSIS — R6889 Other general symptoms and signs: Secondary | ICD-10-CM | POA: Diagnosis present

## 2014-12-04 DIAGNOSIS — M6289 Other specified disorders of muscle: Secondary | ICD-10-CM | POA: Diagnosis present

## 2014-12-04 DIAGNOSIS — R29898 Other symptoms and signs involving the musculoskeletal system: Secondary | ICD-10-CM | POA: Diagnosis present

## 2014-12-04 DIAGNOSIS — M25511 Pain in right shoulder: Secondary | ICD-10-CM | POA: Diagnosis present

## 2014-12-04 DIAGNOSIS — R2689 Other abnormalities of gait and mobility: Secondary | ICD-10-CM

## 2014-12-04 DIAGNOSIS — R4701 Aphasia: Secondary | ICD-10-CM | POA: Diagnosis present

## 2014-12-04 DIAGNOSIS — R279 Unspecified lack of coordination: Secondary | ICD-10-CM | POA: Insufficient documentation

## 2014-12-04 DIAGNOSIS — C719 Malignant neoplasm of brain, unspecified: Secondary | ICD-10-CM

## 2014-12-04 DIAGNOSIS — R29818 Other symptoms and signs involving the nervous system: Secondary | ICD-10-CM | POA: Diagnosis present

## 2014-12-04 DIAGNOSIS — R269 Unspecified abnormalities of gait and mobility: Secondary | ICD-10-CM

## 2014-12-04 LAB — CBC WITH DIFFERENTIAL/PLATELET
BASO%: 0.2 % (ref 0.0–2.0)
BASOS ABS: 0 10*3/uL (ref 0.0–0.1)
EOS ABS: 0.1 10*3/uL (ref 0.0–0.5)
EOS%: 2.1 % (ref 0.0–7.0)
HCT: 36.7 % (ref 34.8–46.6)
HEMOGLOBIN: 12.3 g/dL (ref 11.6–15.9)
LYMPH%: 12.5 % — ABNORMAL LOW (ref 14.0–49.7)
MCH: 30.9 pg (ref 25.1–34.0)
MCHC: 33.5 g/dL (ref 31.5–36.0)
MCV: 92.2 fL (ref 79.5–101.0)
MONO#: 0.3 10*3/uL (ref 0.1–0.9)
MONO%: 5.7 % (ref 0.0–14.0)
NEUT#: 4.1 10*3/uL (ref 1.5–6.5)
NEUT%: 79.5 % — ABNORMAL HIGH (ref 38.4–76.8)
Platelets: 81 10*3/uL — ABNORMAL LOW (ref 145–400)
RBC: 3.98 10*6/uL (ref 3.70–5.45)
RDW: 13.9 % (ref 11.2–14.5)
WBC: 5.1 10*3/uL (ref 3.9–10.3)
lymph#: 0.6 10*3/uL — ABNORMAL LOW (ref 0.9–3.3)
nRBC: 0 % (ref 0–0)

## 2014-12-04 NOTE — Therapy (Signed)
[facility] [address] [place], [place], [address] Phone: [phone]   Fax:  [phone]  Physical Therapy Treatment  Patient Details  Name: Kelly Fletcher MRN: [id number] Date of Birth: 1950-06-09 Referring Provider:  Estill Dooms, MD  Encounter Date: 12/04/2014      PT End of Session - 12/04/14 1104    Visit Number 12   Number of Visits 17   Date for PT Re-Evaluation 12/10/14   Authorization Type G-code & KX modifier   PT Start Time 1103   PT Stop Time 1148   PT Time Calculation (min) 45 min   Equipment Utilized During Treatment Gait belt   Activity Tolerance Patient tolerated treatment well   Behavior During Therapy [facility] for tasks assessed/performed      Past Medical History  Diagnosis Date  . Uterine prolapse   . Cystocele   . GERD (gastroesophageal reflux disease)   . Elevated cholesterol   . Rectocele   . Hypertension   . Arthritis   . Type II or unspecified type diabetes mellitus without mention of complication, uncontrolled     Type 2  . Urinary incontinence     Urgency  . Anxiety state, unspecified   . Palpitations   . Allergic rhinitis, cause unspecified   . Other and unspecified hyperlipidemia   . Unspecified hypothyroidism   . Edema   . Insomnia, unspecified   . Depressive disorder, not elsewhere classified   . Unspecified urinary incontinence   . Peptic ulcer, unspecified site, unspecified as acute or chronic, without mention of hemorrhage, perforation, or obstruction   . Disturbance of skin sensation   . Calculus of kidney   . Facial spasm 04/05/2014  . Abnormal brain MRI 04/13/2014  . 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 Monilial vaginitis 10/23/2014    Past Surgical History  Procedure Laterality Date  . Tonsillectomy and adenoidectomy  1963  . Dilation and curettage of uterus      3-05  . Hysteroscopy      3-05  . Endometrial biopsy      Dr.Gottsegen   . Tongue biopsy      There were no vitals filed for this visit.  Visit Diagnosis:  Abnormality of gait  Balance problems  Decreased functional activity tolerance  Right leg weakness      Subjective Assessment - 12/04/14 1109    Subjective No falls to report. Complained of L ankle weakness from sprain and intermittent L knee/hip pain.   Patient is accompained by: Family member   Currently in Pain? Yes   Pain Score 2    Pain Location Shoulder   Pain Orientation Right   Pain Descriptors / Indicators Sore   Pain Type Chronic pain   Pain Onset More than a month ago   Pain Frequency Intermittent   Aggravating Factors  wakes up with it; thinks it is related to positioning at night - reinforced positioning with pt   Pain Relieving Factors ice, heat, positioning   Multiple Pain Sites Yes   Pain Score 2   Pain Location Knee   Pain Orientation Left   Pain Descriptors / Indicators Sore   Pain Type Acute pain   Pain Onset 1 to 4 weeks ago   Pain Frequency Intermittent               [facility] Adult PT Treatment/Exercise -  12/04/14 1128    Ambulation/Gait   Ambulation/Gait Yes   Ambulation/Gait Assistance 5: Supervision   Ambulation/Gait Assistance Details manual, verbal & tactile on pelvis & shoulder girdle rotation movement patterns with resistive gait    Ambulation Distance (Feet) 1200 Feet  1200 ft outdoors & 1100' with cueing indoors    Assistive device None   Gait Pattern Step-through pattern   Ambulation Surface Level;Unlevel;Indoor;Outdoor;Paved;Gravel;Grass   Ramp 5: Supervision   Curb 5: Supervision                PT Education - 12/04/14 1301    Education provided Yes   Education Details using frozen water bottle for ice massage for ITB treatment; proper shoe wear; sidelying positioning to help with shoulder pain; posture/pelvic rotation during gait   Person(s) Educated Patient;Spouse   Methods  Explanation;Demonstration;Tactile cues;Verbal cues   Comprehension Verbalized understanding          PT Short Term Goals - 11/12/14 1152    PT SHORT TERM GOAL #1   Title             PT Long Term Goals - 11/28/14 1134    PT LONG TERM GOAL #1   Title demonstrates / verbalizes understanding of ongoing HEP / fitness plan. (Target Date: 12/07/2014)   Time 8   Period Weeks   Status New   PT LONG TERM GOAL #2   Title Cognitive Timed Up & Go <15 sec safely  (Target Date: 12/07/2014)   Time 8   Period Weeks   Status New   PT LONG TERM GOAL #3   Title Berg Balance >/=52/56  (Target Date: 12/07/2014)   Time 8   Period Weeks   Status New   PT LONG TERM GOAL #4   Title Functional Gait Assessment >24/ 30  (Target Date: 12/07/2014)   Baseline 11/26/14: 25/30 scored today   Status Achieved   PT LONG TERM GOAL #5   Title ambulates >1000' without device including grass, ramps, curbs, stairs indpendently.  (Target Date: 12/07/2014)   Time 8   Period Weeks   Status New               Plan - 12/04/14 1304    Clinical Impression Statement Emphasis on posture and pelvic rotation during gait due to pt complaining of L hip/knee and shoulder discomfort. With tactile cues and resistive gait, pt able to recognize impact of improved posture on reducing shoulder pain. Pt continuing to make progress toward goals.   Pt will benefit from skilled therapeutic intervention in order to improve on the following deficits Abnormal gait;Decreased activity tolerance;Decreased balance;Decreased endurance;Decreased strength   Rehab Potential Good   PT Frequency 2x / week   PT Duration 8 weeks   PT Treatment/Interventions ADLs/[facility];DME Instruction;Gait training;Stair training;Functional mobility training;Therapeutic activities;Therapeutic exercise;Balance training;Neuromuscular re-education;Patient/family education   PT Next Visit Plan cont with strengthening and high level balance training    PT Home Exercise Plan hip abduction strengthening for bil. LE's   Consulted and Agree with Plan of Care Patient;Family member/caregiver   Family Member Consulted husband     PLAN: Assess LTGs next visit.   Problem List Patient Active Problem List   Diagnosis Date Noted  . Monilial vaginitis 10/23/2014  . Deep vein thrombosis 08/12/2014  . History of inferior vena caval filter placement 07/14/2014  . Malignant glioma of brain 06/28/2014  . Diabetes mellitus, type 2 06/27/2014  . 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 Tremor  of both hands 04/05/2014  . Vertigo 04/05/2014  . BPPV (benign paroxysmal positional vertigo) 03/14/2014  . Seizures 12/16/2013  . Epigastric pain 11/01/2013  . Fever blister 10/16/2013  . Unspecified hypothyroidism 12/13/2012  . Other and unspecified hyperlipidemia 12/13/2012  . Anxiety state, unspecified 12/13/2012  . Edema 12/13/2012  . Anxiety state 12/13/2012  . Adult hypothyroidism 12/13/2012  . Type II or unspecified type diabetes mellitus without mention of complication, not stated as uncontrolled   . Urinary incontinence   . DEPRESSION 09/24/2007  . Essential hypertension 09/24/2007  . GERD 09/24/2007  . HIATAL HERNIA 09/24/2007  . 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 Headache(784.0) 09/24/2007    Rubye Oaks 12/04/2014, 1:16 PM  Rubye Oaks, [facility] [address] [place], [place], [address] Phone: [phone]   Fax:  [phone]  Jamey Reas, [place], DPT PT Specializing in [place] 12/04/2014 5:16 PM Phone:  [phone]  Fax:  [phone] [facility] [address] [place], [place] [address]

## 2014-12-04 NOTE — Patient Instructions (Signed)
Add to your arm exercises at home: Do these within your pain tolerance.   1. Sit on a firm surface.  Make sure it isn't too low.  Hold ball between both hands.  Keeping elbows straight, raise ball up to shoulder level then lower.  MAKE SURE YOU KEEP YOUR ELBOWS STRAIGHT, and don't lean to the left.  Keep your shoulder down.  Your husband can help to monitor this.  Do 10, rest then do 10 more.  If your arm shakes it just means it is tired so it is ok to take a little rest.   2.  Sit on a firm surface. Clasp hands together.  For the first 10, reach forward with hands clasped the way you have been doing. Take a rest. For the second 10, you will be reaching for an object (cone, ball).  Tell your arm to keep you elbow straight and then actively reach forward, grab the cone and return your hand to to floor. Then reach forward and give back the cone, return your hand to the floor and sit up.    3. Practice assisted reach activities. 4. Use red foam and try to use a fork at home (using assisted reach).  You an also practice finger foods.  5  Use the red foam to brush your teeth (assisted reach).

## 2014-12-04 NOTE — Therapy (Signed)
[facility] [address] [place], [place], [address] Phone: [phone]   Fax:  [phone]  Speech Language Pathology Treatment  Patient Details  Name: Kelly Fletcher MRN: [id number] Date of Birth: 1950/08/30 Referring Provider:  Estill Dooms, MD  Encounter Date: 12/04/2014      End of Session - 12/04/14 1620    Visit Number 11   Number of Visits 16   Date for SLP Re-Evaluation 12/08/14   SLP Start Time 57   SLP Stop Time  1616   SLP Time Calculation (min) 42 min   Activity Tolerance Patient tolerated treatment well      Past Medical History  Diagnosis Date  . Uterine prolapse   . Cystocele   . GERD (gastroesophageal reflux disease)   . Elevated cholesterol   . Rectocele   . Hypertension   . Arthritis   . Type II or unspecified type diabetes mellitus without mention of complication, uncontrolled     Type 2  . Urinary incontinence     Urgency  . Anxiety state, unspecified   . Palpitations   . Allergic rhinitis, cause unspecified   . Other and unspecified hyperlipidemia   . Unspecified hypothyroidism   . Edema   . Insomnia, unspecified   . Depressive disorder, not elsewhere classified   . Unspecified urinary incontinence   . Peptic ulcer, unspecified site, unspecified as acute or chronic, without mention of hemorrhage, perforation, or obstruction   . Disturbance of skin sensation   . Calculus of kidney   . Facial spasm 04/05/2014  . Abnormal brain MRI 04/13/2014  . 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 Monilial vaginitis 10/23/2014    Past Surgical History  Procedure Laterality Date  . Tonsillectomy and adenoidectomy  1963  . Dilation and curettage of uterus      3-05  . Hysteroscopy      3-05  . Endometrial biopsy      Dr.Gottsegen   . Tongue biopsy      There were no vitals filed for this visit.  Visit Diagnosis:  Aphasia      Subjective Assessment - 12/04/14 1543    Subjective Pt didn't have as much progress this week as last week.               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 Average pt assessment of use of strategies 7.75/10 (10=WNL speech)   Assessment / Recommendations / Plan   Plan Continue with current plan of care   Progression Toward Goals   Progression toward goals Progressing toward goals            SLP Short Term Goals - 11/21/14 1235    SLP SHORT TERM GOAL #1   Title pt will complete HEP for motor speech planning with slow rate 95% success with rare verbal cues   Time 1   Period Weeks   Status Achieved   SLP SHORT TERM GOAL #2   Title pt will produce sentences using speech compensations with 85% fluency   Time 1  Status Achieved   SLP SHORT TERM GOAL #3   Title pt will tell SLP three compensations for improving motor planning for speech   Time 1   Status Achieved   SLP SHORT TERM GOAL #4   Title pt to participate in 5 minutes simple conversation with 85% fluency with modified independence   Time 1   Period Weeks   Status Achieved          SLP Long Term Goals - 12/04/14 1621    SLP LONG TERM GOAL #1   Title pt will participate in 10 mintues mod complex conversation 90% fluency, with modified independence (compensations)   Status Achieved   SLP LONG TERM GOAL #2   Title pt will complete HEP with 90% fluency and modifed independence (compensations)   Status Achieved   SLP LONG TERM GOAL #3   Title pt will participate in 7 minutes complex conversation with 85% fluency with modified  independence   Time --   Period --   Status Achieved   SLP LONG TERM GOAL #4   Title pt will Heine-rate her speech (in 2 to 3-minute intervals) average 9.0 over 2 sessions   Time 5   Period Weeks   Status New          Plan - 12/04/14 1621    Clinical Impression Statement SLP is req'd to cont to provide cues for pt to use speech compensations in structured tasks as well as in conversation.   Speech Therapy Frequency 1x /week   Duration 1 week  Renewal needed week of 12-10-14   Treatment/Interventions Cueing hierarchy;Functional tasks;Patient/family education;Compensatory strategies;SLP instruction and feedback   Potential to Achieve Goals Fair   Potential Considerations Co-morbidities;Severity of impairments        Problem List Patient Active Problem List   Diagnosis Date Noted  . Monilial vaginitis 10/23/2014  . Deep vein thrombosis 08/12/2014  . History of inferior vena caval filter placement 07/14/2014  . Malignant glioma of brain 06/28/2014  . Diabetes mellitus, type 2 06/27/2014  . 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 Tremor of both hands 04/05/2014  . Vertigo 04/05/2014  . BPPV (benign paroxysmal positional vertigo) 03/14/2014  . Seizures 12/16/2013  . Epigastric pain 11/01/2013  . Fever blister 10/16/2013  . Unspecified hypothyroidism 12/13/2012  . Other and unspecified hyperlipidemia 12/13/2012  . Anxiety state, unspecified 12/13/2012  . Edema 12/13/2012  . Anxiety state 12/13/2012  . Adult hypothyroidism 12/13/2012  . Type II or unspecified type diabetes mellitus without mention of complication, not stated as uncontrolled   . Urinary incontinence   . DEPRESSION 09/24/2007  . Essential hypertension 09/24/2007  . GERD 09/24/2007  . HIATAL HERNIA 09/24/2007  . 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 Headache(784.0) 09/24/2007    Raymund [email or web address]  12/04/2014, 4:26 PM  [facility] [address] [place], [place], [address] Phone: [phone]   Fax:  [phone]

## 2014-12-04 NOTE — Patient Instructions (Signed)
Continue to speak with a slower pace - think about opening your mouth wider.

## 2014-12-04 NOTE — Therapy (Signed)
[facility] [address] [place], [place], [address] Phone: [phone]   Fax:  [phone]  Occupational Therapy Treatment  Patient Details  Name: Kelly Fletcher MRN: [id number] Date of Birth: January 15, 1951 Referring Provider:  Estill Dooms, MD  Encounter Date: 12/04/2014      OT End of Session - 12/04/14 1748    Visit Number 13   Number of Visits 17   Date for OT Re-Evaluation 12/08/14   Authorization Type MCR - G code needed   Authorization Time Period 60 Days   OT Start Time 1617   OT Stop Time 1702   OT Time Calculation (min) 45 min   Activity Tolerance Patient tolerated treatment well      Past Medical History  Diagnosis Date  . Uterine prolapse   . Cystocele   . GERD (gastroesophageal reflux disease)   . Elevated cholesterol   . Rectocele   . Hypertension   . Arthritis   . Type II or unspecified type diabetes mellitus without mention of complication, uncontrolled     Type 2  . Urinary incontinence     Urgency  . Anxiety state, unspecified   . Palpitations   . Allergic rhinitis, cause unspecified   . Other and unspecified hyperlipidemia   . Unspecified hypothyroidism   . Edema   . Insomnia, unspecified   . Depressive disorder, not elsewhere classified   . Unspecified urinary incontinence   . Peptic ulcer, unspecified site, unspecified as acute or chronic, without mention of hemorrhage, perforation, or obstruction   . Disturbance of skin sensation   . Calculus of kidney   . Facial spasm 04/05/2014  . Abnormal brain MRI 04/13/2014  . 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 Monilial vaginitis 10/23/2014    Past Surgical History  Procedure Laterality Date  . Tonsillectomy and adenoidectomy  1963  . Dilation and curettage of uterus      3-05  . Hysteroscopy      3-05  . Endometrial biopsy      Dr.Gottsegen   . Tongue biopsy       There were no vitals filed for this visit.  Visit Diagnosis:  Pain in joint, shoulder region, right  Weakness of right arm  Cognitive deficits  Hemiplegia affecting right dominant side  Lack of coordination  Weakness of right hand      Subjective Assessment - 12/04/14 1618    Subjective  I have been doing my exercises and the muscles are sore but don't really hurt.   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 No/denies                      OT Treatments/Exercises (OP) - 12/04/14 0001    Neurological Re-education Exercises   Other Exercises 1 Neuro re ed to address upgrading HEP - please see pt instruction for specific details. Pt requires intense repetition and encouragement as well as consistent redirection back to task. Pt also requires max vc's to use all of the isolated movement she has to complete functional tasks due to sensory impairments, apraxia and cognitive impairments. Also addressed functional use of RUE and incorporated into HEP  OT Education - 12/04/14 1743    Education provided Yes   Education Details upgraded HEP for UE and functional use.     Person(s) Educated Patient;Spouse   Methods Explanation;Demonstration;Tactile cues;Verbal cues;Handout   Comprehension Verbalized understanding;Returned demonstration  husband; pt needs cues and encouragement          OT Short Term Goals - 12/04/14 1744    OT SHORT TERM GOAL #1   Title Independent with HEP for Rt hand coordination, RUE ROM and Rt hand strength (DUE 11/08/14)   Time 4   Period Weeks   Status Not Met  Pt has had severe pain in R shoulder therefore has not met any STG's as treatment has needed to focus on addressing pain   OT SHORT TERM GOAL #2   Title Pt to verbalize  understanding with pain management strategies RUE   Time 4   Period Weeks   Status Partially Met  able to verbalize some positioning and movement strategies   OT SHORT TERM GOAL #3   Title Pt to write name at 75% or greater legibility w/ A/E for pen prn Rt dominant hand   Baseline approx. 25% first name only with built up pen   Time 4   Period Weeks   Status Not Met   OT SHORT TERM GOAL #4   Title Improve RUE functional use as evidenced by performing 25 blocks or greater on Box & Blocks test   Baseline eval = 17   Time 4   Period Weeks   Status Not Met  12 on 12/04/2014   OT SHORT TERM GOAL #5   Title Pt to improve grip strength Rt hand to 20 lbs or greater to decrease drops   Baseline eval = 10 lbs (Lt = 55 lbs)   Time 4   Period Weeks   Status Not Met  10 pounds on 12/04/2014   OT SHORT TERM GOAL #6   Title Pt to tie shoes and hook bra mod I level incorporating Rt dominant hand for assist   Baseline dependent   Time 4   Period Weeks   Status Not Met   OT SHORT TERM GOAL #7   Title Pt to use Rt dominant hand to feed Rote and groom Barcus 50% of the time    Baseline none with Rt hand, using Lt non dominant hand   Time 4   Period Weeks   Status Not Met           OT Long Term Goals - 12/04/14 1744    OT LONG TERM GOAL #1   Title Independent with updated HEP for RUE strength (DUE 12/08/14)   Time 8   Period Weeks   Status Deferred  ALL LTG's will be deferred until renewal period secondary to pain in Rt shoulder limiting ability to work towards these goals. To work on [employer] as able and revise prn for renewal period in July   [place] #2   Title Pt to perform high level reaching RUE to retrieve/replace 3 lb. object from high shelf 10 times w/o rest   Baseline only 75% shoulder flexion   Time 8   Period Weeks   Status Deferred   OT LONG TERM GOAL #3   Title Improve RUE functional use as evidenced by performing 40 blocks on Box & Blocks test    Baseline eval = 17    Time 8   Period Weeks   Status Deferred  OT LONG TERM GOAL #4   Title Improve coordination as evidenced by performing 9 hole peg test in under 90 sec.    Baseline eval: only able to place 1 peg in 60 sec.    Time 8   Period Weeks   Status Deferred   OT LONG TERM GOAL #5   Title Pt to improve grip strength Rt dominant hand to 30 lbs or greater to open jars/containers   Baseline eval = 10 lbs   Time 8   Period Weeks   Status Deferred   OT LONG TERM GOAL #6   Title Pt to return to using Rt hand as dominant hand for all BADLS 52% of the time or greater   Baseline only using LUE   Time 8   Period Weeks   Status Deferred   OT LONG TERM GOAL #7   Title Pt to write short paragraph with Rt dominant hand using A/E prn with 90% legibility    Baseline 25% legibility for first name only   Time 8   Period Weeks   Status Deferred   OT LONG TERM GOAL #8   Title Pt to return to light cooking/cleaning tasks mod I level with A/E and/or DME prn   Baseline dependent   Time 8   Period Weeks   Status Deferred               Plan - 12/04/14 1745    Clinical Impression Statement Pt has not met goals due to severe exacerbation of pain in R shoulder as well as due to apraxia, sensory loss and cogntiive deficits.  Pt has improved in isolated movement since eval and has also greatly improved in pain (see all progress notes). Husband supportive and willing to monitor and encourage patient.   Pt will benefit from skilled therapeutic intervention in order to improve on the following deficits (Retired) Decreased coordination;Decreased range of motion;Difficulty walking;Decreased safety awareness;Decreased endurance;Increased edema;Impaired sensation;Decreased activity tolerance;Decreased knowledge of precautions;Impaired tone;Decreased balance;Decreased knowledge of use of DME;Impaired UE functional use;Pain;Decreased cognition;Decreased mobility;Decreased strength   Rehab Potential Good   Clinical  Impairments Affecting Rehab Potential pain RUE, apraxia   OT Frequency 2x / week   OT Duration 8 weeks   OT Treatment/Interventions [place]-care/ADL training;Fluidtherapy;Moist Heat;DME and/or AE instruction;Splinting;Patient/family education;Therapeutic exercises;Balance training;Therapeutic activities;Neuromuscular education;Functional Mobility Training;Passive range of motion;Cognitive remediation/compensation;Electrical Stimulation;Parrafin;Energy conservation;Manual Therapy   Plan discuss renewal option with pt and establish new goals prn, neuro re ed to RUE, address pain as needed, address incoporation of function   Consulted and Agree with Plan of Care Patient;Family member/caregiver   Family Member Consulted HUSBAND        Problem List Patient Active Problem List   Diagnosis Date Noted  . Monilial vaginitis 10/23/2014  . Deep vein thrombosis 08/12/2014  . History of inferior vena caval filter placement 07/14/2014  . Malignant glioma of brain 06/28/2014  . Diabetes mellitus, type 2 06/27/2014  . 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 Tremor of both hands 04/05/2014  . Vertigo 04/05/2014  . BPPV (benign paroxysmal positional vertigo) 03/14/2014  . Seizures 12/16/2013  . Epigastric pain 11/01/2013  . Fever blister 10/16/2013  . Unspecified hypothyroidism 12/13/2012  . Other and unspecified hyperlipidemia 12/13/2012  . Anxiety state, unspecified 12/13/2012  . Edema 12/13/2012  . Anxiety state 12/13/2012  . Adult hypothyroidism 12/13/2012  . Type II or unspecified type diabetes mellitus without mention of complication, not stated as uncontrolled   . Urinary incontinence   .  DEPRESSION 09/24/2007  . Essential hypertension 09/24/2007  . GERD 09/24/2007  . HIATAL HERNIA 09/24/2007  . 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 Headache(784.0) 09/24/2007    Quay Burow, OTR/L 12/04/2014, 5:50 PM  [facility] [address] [place], [place], [address] Phone: [phone]   Fax:  [phone]

## 2014-12-05 ENCOUNTER — Telehealth: Payer: Self-pay | Admitting: Oncology

## 2014-12-05 ENCOUNTER — Other Ambulatory Visit: Payer: Self-pay | Admitting: *Deleted

## 2014-12-05 DIAGNOSIS — C719 Malignant neoplasm of brain, unspecified: Secondary | ICD-10-CM

## 2014-12-05 NOTE — Telephone Encounter (Signed)
S/w pt confirming labs per 07/06 POF, also s/w pt's husband concerning MRI that was done on 06/30 per [facility] RN information given to husband there are some concerns abou t the scan and what was found. He is going to call MD at [facility] to have them fax over the report and I will give it to Dr. Benay Spice to go over and let him know there is a concern from pt's husband..... KJ

## 2014-12-06 ENCOUNTER — Encounter: Payer: Self-pay | Admitting: Occupational Therapy

## 2014-12-06 ENCOUNTER — Ambulatory Visit: Payer: Medicare Other | Admitting: Occupational Therapy

## 2014-12-06 ENCOUNTER — Ambulatory Visit: Payer: Medicare Other | Admitting: Physical Therapy

## 2014-12-06 ENCOUNTER — Encounter: Payer: Self-pay | Admitting: Physical Therapy

## 2014-12-06 DIAGNOSIS — R6889 Other general symptoms and signs: Secondary | ICD-10-CM

## 2014-12-06 DIAGNOSIS — R4189 Other symptoms and signs involving cognitive functions and awareness: Secondary | ICD-10-CM

## 2014-12-06 DIAGNOSIS — R269 Unspecified abnormalities of gait and mobility: Secondary | ICD-10-CM | POA: Diagnosis not present

## 2014-12-06 DIAGNOSIS — G8191 Hemiplegia, unspecified affecting right dominant side: Secondary | ICD-10-CM

## 2014-12-06 DIAGNOSIS — R279 Unspecified lack of coordination: Secondary | ICD-10-CM

## 2014-12-06 DIAGNOSIS — R29898 Other symptoms and signs involving the musculoskeletal system: Secondary | ICD-10-CM

## 2014-12-06 DIAGNOSIS — M25511 Pain in right shoulder: Secondary | ICD-10-CM

## 2014-12-06 DIAGNOSIS — R2689 Other abnormalities of gait and mobility: Secondary | ICD-10-CM

## 2014-12-06 NOTE — Therapy (Signed)
[facility] [address] [place], [place], [address] Phone: [phone]   Fax:  [phone]  Physical Therapy Treatment  Patient Details  Name: Kelly Fletcher MRN: [id number] Date of Birth: [birth year] Referring Provider:  Estill Dooms, MD  Encounter Date: 12/06/2014      PT End of Session - 12/06/14 1427    Visit Number 13   Number of Visits 17   Date for PT Re-Evaluation 12/10/14   Authorization Type G-code & KX modifier   PT Start Time 1100   PT Stop Time 1200   PT Time Calculation (min) 60 min   Equipment Utilized During Treatment Gait belt   Activity Tolerance Patient tolerated treatment well   Behavior During Therapy [facility] for tasks assessed/performed      Past Medical History  Diagnosis Date  . Uterine prolapse   . Cystocele   . GERD (gastroesophageal reflux disease)   . Elevated cholesterol   . Rectocele   . Hypertension   . Arthritis   . Type II or unspecified type diabetes mellitus without mention of complication, uncontrolled     Type 2  . Urinary incontinence     Urgency  . Anxiety state, unspecified   . Palpitations   . Allergic rhinitis, cause unspecified   . Other and unspecified hyperlipidemia   . Unspecified hypothyroidism   . Edema   . Insomnia, unspecified   . Depressive disorder, not elsewhere classified   . Unspecified urinary incontinence   . Peptic ulcer, unspecified site, unspecified as acute or chronic, without mention of hemorrhage, perforation, or obstruction   . Disturbance of skin sensation   . Calculus of kidney   . Facial spasm 04/05/2014  . Abnormal brain MRI 04/13/2014  . 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 Monilial vaginitis 10/23/2014    Past Surgical History  Procedure Laterality Date  . Tonsillectomy and adenoidectomy  1963  . Dilation and curettage of uterus      3-05  . Hysteroscopy       3-05  . Endometrial biopsy      Dr.Gottsegen   . Tongue biopsy      There were no vitals filed for this visit.  Visit Diagnosis:  Abnormality of gait  Balance problems  Decreased functional activity tolerance  Right leg weakness      Subjective Assessment - 12/06/14 1108    Subjective Pt continues to complain of L knee pain and weakness in L LE due to ankle sprain a couple weeks ago. Pt stated that it feels like she may have twisted her L knee. Pt feels unsafe walking by herself, particularly on grass. No new falls.   Patient is accompained by: Family member   Currently in Pain? Yes   Pain Score 2    Pain Location Knee   Pain Orientation Left   Pain Descriptors / Indicators Sore   Pain Type Acute pain   Pain Onset 1 to 4 weeks ago   Pain Frequency Intermittent   Multiple Pain Sites No            [facility] PT Assessment - 12/06/14 1100    Timed Up and Go Test   Normal TUG (seconds) 12.3   Cognitive TUG (seconds) 14.77   Functional Gait  Assessment   Gait Level Surface Walks 20 ft in less than 7 sec but greater than 5.5  sec, uses assistive device, slower speed, mild gait deviations, or deviates 6-10 in outside of the 12 in walkway width.   Change in Gait Speed Able to smoothly change walking speed without loss of balance or gait deviation. Deviate no more than 6 in outside of the 12 in walkway width.   Gait with Horizontal Head Turns Performs head turns smoothly with no change in gait. Deviates no more than 6 in outside 12 in walkway width   Gait with Vertical Head Turns Performs head turns with no change in gait. Deviates no more than 6 in outside 12 in walkway width.   Gait and Pivot Turn Pivot turns safely within 3 sec and stops quickly with no loss of balance.   Step Over Obstacle Is able to step over one shoe box (4.5 in total height) without changing gait speed. No evidence of imbalance.   Gait with Narrow Base of Support Ambulates less than 4 steps heel to toe or  cannot perform without assistance.   Gait with Eyes Closed Walks 20 ft, uses assistive device, slower speed, mild gait deviations, deviates 6-10 in outside 12 in walkway width. Ambulates 20 ft in less than 9 sec but greater than 7 sec.   Ambulating Backwards Walks 20 ft, no assistive devices, good speed, no evidence for imbalance, normal gait   Steps Alternating feet, must use rail.   Total Score 23               [facility] Adult PT Treatment/Exercise - 12/06/14 1100    Ambulation/Gait   Ambulation/Gait Yes   Ambulation/Gait Assistance 5: Supervision;7: Independent   Ambulation/Gait Assistance Details independent on level surfaces indoors and outdoors; supervision for safety on uneven surfaces outdoors   Ambulation Distance (Feet) 1000 Feet   Assistive device None   Gait Pattern Step-through pattern   Ambulation Surface Level;Unlevel;Indoor;Outdoor;Paved;Grass   Standardized Balance Assessment   Standardized Balance Assessment Berg Balance Test;Timed Up and Go Test   Berg Balance Test   Sit to Stand Able to stand without using hands and stabilize independently   Standing Unsupported Able to stand safely 2 minutes   Sitting with Back Unsupported but Feet Supported on Floor or Stool Able to sit safely and securely 2 minutes   Stand to Sit Sits safely with minimal use of hands   Transfers Able to transfer safely, minor use of hands   Standing Unsupported with Eyes Closed Able to stand 10 seconds safely   Standing Ubsupported with Feet Together Able to place feet together independently and stand for 1 minute with supervision   From Standing, Reach Forward with Outstretched Arm Can reach forward >12 cm safely (5")   From Standing Position, Pick up Object from Floor Able to pick up shoe, needs supervision   From Standing Position, Turn to Look Behind Over each Shoulder Looks behind one side only/other side shows less weight shift   Turn 360 Degrees Able to turn 360 degrees safely but slowly    Standing Unsupported, Alternately Place Feet on Step/Stool Able to complete 4 steps without aid or supervision   Standing Unsupported, One Foot in Front Able to take small step independently and hold 30 seconds   Standing on One Leg Able to lift leg independently and hold equal to or more than 3 seconds   Total Score 44                  PT Short Term Goals - 12/06/14 1100    PT  SHORT TERM GOAL #1   Title patient correcty demonstrates initial HEP with husband cueing. (Target Date: 11/09/2014)   Time 4   Period Weeks   Status Achieved   PT SHORT TERM GOAL #2   Title Timed Up & Go <13.5 sec without device (Target Date: 11/09/2014)  Current time is 8.13 seconds (11/08/14)   Time 4   Period Weeks   Status Achieved   PT SHORT TERM GOAL #3   Title Berg Balance >46/56 (Target Date: 11/09/2014)  Current [name] is 54/56 (11/08/14)   Time 4   Period Weeks   Status Achieved   PT SHORT TERM GOAL #4   Title ambulates 500' without device including grass surfaces, ramps & curbs with supervision. (Target Date: 11/09/2014)   Time 4   Period Weeks   Status Achieved   PT SHORT TERM GOAL #5   Title ambulates 200' without device on grass with supervision while carrying on conversation. (Target Date: 01/04/2015)   Time 4   Period Weeks   Status New   Additional Short Term Goals   Additional Short Term Goals Yes   PT SHORT TERM GOAL #6   Title Functional Gait Assessment >/= 25/30 (Target Date: 01/04/2015)   Time 4   Period Weeks   Status New           PT Long Term Goals - 12/06/14 1100    PT LONG TERM GOAL #1   Title demonstrates / verbalizes understanding of ongoing HEP / fitness plan. (UPDATED Target Date: 02/01/2015)   Baseline Met: 12/06/14 but with continued PT HEP / fitness plan will need to be progressed further.    Time 8   Period Weeks   Status On-going   PT LONG TERM GOAL #2   Title Cognitive Timed Up & Go <15 sec safely  (Target Date: 12/07/2014)   Baseline Met: 12/06/14 14.77  sec   Time 8   Period Weeks   Status Achieved   PT LONG TERM GOAL #3   Title Berg Balance >/=52/56  (UPDATED Target Date: 02/01/2015)   Baseline NOT MET 12/06/2014 [name] Balance 44/56    Time 8   Period Weeks   Status On-going   PT LONG TERM GOAL #4   Title Functional Gait Assessment >25/ 30  (UPDATED Target Date: 02/01/2015)   Baseline Partially MET 12/06/2014 FGA 23/30   Status On-going   PT LONG TERM GOAL #5   Title ambulates >1000' without device including grass, ramps, curbs, stairs indpendently.  (Target Date: 12/07/2014)   Baseline partially met 12/06/14; pt able to ambulate >1000' without device independently on level surfaces indoors and outdoors, but still requires supervision for safety on grass   Time 8   Period Weeks   Status Partially Met   Additional Long Term Goals   Additional Long Term Goals Yes   PT LONG TERM GOAL #6   Title Cognitive Timed Up & Go <15% increase from standard TUG (UPDATED Target Date: 02/01/2015)   Time 8   Period Weeks   Status New   PT LONG TERM GOAL #7   Title Patient ambulates 200' on grass without device while carrying on conversation modified independent. (UPDATED Target Date: 02/01/2015)   Time 8   Period Weeks   Status New               Plan - 12/06/14 1432    Clinical Impression Statement Pt has met LTGs #1 & #2, and partially met LTG #5. Pt able  to ambulate independently on level surfaces indoors and outdoors, but requires supervision for safety on grass. Pt scores slightly decreased for functional gait assessment and Berg, which appears to be due to recent L ankle sprain that has resulted in decreased L ankle stability and L LE weakness.    Pt will benefit from skilled therapeutic intervention in order to improve on the following deficits Abnormal gait;Decreased activity tolerance;Decreased balance;Decreased endurance;Decreased strength   Rehab Potential Good   PT Frequency 1x / week   PT Duration 8 weeks   PT Treatment/Interventions  ADLs/[facility];DME Instruction;Gait training;Stair training;Functional mobility training;Therapeutic activities;Therapeutic exercise;Balance training;Neuromuscular re-education;Patient/family education   PT Next Visit Plan cont with strengthening and high level balance training   PT Home Exercise Plan hip abduction strengthening for bil. LE's   Consulted and Agree with Plan of Care Patient;Family member/caregiver   Family Member Consulted husband        Problem List Patient Active Problem List   Diagnosis Date Noted  . Monilial vaginitis 10/23/2014  . Deep vein thrombosis 08/12/2014  . History of inferior vena caval filter placement 07/14/2014  . Malignant glioma of brain 06/28/2014  . Diabetes mellitus, type 2 06/27/2014  . 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 Tremor of both hands 04/05/2014  . Vertigo 04/05/2014  . BPPV (benign paroxysmal positional vertigo) 03/14/2014  . Seizures 12/16/2013  . Epigastric pain 11/01/2013  . Fever blister 10/16/2013  . Unspecified hypothyroidism 12/13/2012  . Other and unspecified hyperlipidemia 12/13/2012  . Anxiety state, unspecified 12/13/2012  . Edema 12/13/2012  . Anxiety state 12/13/2012  . Adult hypothyroidism 12/13/2012  . Type II or unspecified type diabetes mellitus without mention of complication, not stated as uncontrolled   . Urinary incontinence   . DEPRESSION 09/24/2007  . Essential hypertension 09/24/2007  . GERD 09/24/2007  . HIATAL HERNIA 09/24/2007  . 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 Headache(784.0) 09/24/2007    WALDRON,ROBIN 12/06/2014, 7:39 PM  [facility] [address] [place], [place], [address] Phone: [phone]   Fax:  [phone]  STGs & LTGs and plan updated by PT.   Jamey Reas, PT, DPT PT Specializing in [place] 12/06/2014 7:39 PM Phone:  [phone]  Fax:  [phone] [facility] [address] [place], [place] [address]

## 2014-12-06 NOTE — Therapy (Signed)
[facility] [address] [place], [place], [address] Phone: [phone]   Fax:  [phone]  Occupational Therapy Treatment  Patient Details  Name: Kelly Fletcher MRN: [id number] Date of Birth: 07/21/1950 Referring Provider:  Estill Dooms, MD  Encounter Date: 12/06/2014      OT End of Session - 12/06/14 1140    Visit Number 14   Number of Visits 17   Date for OT Re-Evaluation 12/08/14   Authorization Type MCR - G code needed   OT Start Time 1017   OT Stop Time 1102   OT Time Calculation (min) 45 min      Past Medical History  Diagnosis Date  . Uterine prolapse   . Cystocele   . GERD (gastroesophageal reflux disease)   . Elevated cholesterol   . Rectocele   . Hypertension   . Arthritis   . Type II or unspecified type diabetes mellitus without mention of complication, uncontrolled     Type 2  . Urinary incontinence     Urgency  . Anxiety state, unspecified   . Palpitations   . Allergic rhinitis, cause unspecified   . Other and unspecified hyperlipidemia   . Unspecified hypothyroidism   . Edema   . Insomnia, unspecified   . Depressive disorder, not elsewhere classified   . Unspecified urinary incontinence   . Peptic ulcer, unspecified site, unspecified as acute or chronic, without mention of hemorrhage, perforation, or obstruction   . Disturbance of skin sensation   . Calculus of kidney   . Facial spasm 04/05/2014  . Abnormal brain MRI 04/13/2014  . 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 Monilial vaginitis 10/23/2014    Past Surgical History  Procedure Laterality Date  . Tonsillectomy and adenoidectomy  1963  . Dilation and curettage of uterus      3-05  . Hysteroscopy      3-05  . Endometrial biopsy      Dr.Gottsegen   . Tongue biopsy      There were no vitals filed for this visit.  Visit Diagnosis:  Pain in joint,  shoulder region, right  Cognitive deficits  Hemiplegia affecting right dominant side  Lack of coordination  Weakness of right hand      Subjective Assessment - 12/06/14 1021    Subjective  I think I need some more appointments   Patient is accompained by: Family member   Pertinent History glioblastoma with brain surgery to remove tumor 06/28/14, facial seizures   Patient Stated Goals To get my Rt shoulder and hand better b/c I'm Rt handed   Currently in Pain? No/denies                      OT Treatments/Exercises (OP) - 12/06/14 0001    Neurological Re-education Exercises   Other Exercises 1 Neuro re ed to address increased demand for proximal strengthening as well as increasing active range while monitoring for pain. Pt continues with muscle soreness but does not report any joint pain with activities that are closely monitored. Addressed in supine and pt and able to complete activities with 3 pound weignt for mid to overhead reach in supine closed chain. Also addressed in standing with UE ranger again with focus on mid to high reach - pt initially needing mod facilitation but with repetition and increased proximal activity pt eventually able to  to reach to approximately 125* of flexion in closed chain, perform protraction and retraction with min vc's. then do place and hold. Pt then able to progress in sitting to free reach to 90* with grasp and release activity and min compensations and approximately 130* in closed chain with mod facilitation and no pain.                  OT Short Term Goals - 12/06/14 1138    OT SHORT TERM GOAL #1   Title Independent with HEP for Rt hand coordination, RUE ROM and Rt hand strength (DUE 11/08/14)   Time 4   Period Weeks   Status Not Met  Pt has had severe pain in R shoulder therefore has not met any STG's as treatment has needed to focus on addressing pain   OT SHORT TERM GOAL #2   Title Pt to verbalize understanding with pain  management strategies RUE   Time 4   Period Weeks   Status Partially Met  able to verbalize some positioning and movement strategies   OT SHORT TERM GOAL #3   Title Pt to write name at 75% or greater legibility w/ A/E for pen prn Rt dominant hand   Baseline approx. 25% first name only with built up pen   Time 4   Period Weeks   Status Not Met   OT SHORT TERM GOAL #4   Title Improve RUE functional use as evidenced by performing 25 blocks or greater on Box & Blocks test   Baseline eval = 17   Time 4   Period Weeks   Status Not Met  12 on 12/04/2014   OT SHORT TERM GOAL #5   Title Pt to improve grip strength Rt hand to 20 lbs or greater to decrease drops   Baseline eval = 10 lbs (Lt = 55 lbs)   Time 4   Period Weeks   Status Not Met  10 pounds on 12/04/2014   OT SHORT TERM GOAL #6   Title Pt to tie shoes and hook bra mod I level incorporating Rt dominant hand for assist   Baseline dependent   Time 4   Period Weeks   Status Not Met   OT SHORT TERM GOAL #7   Title Pt to use Rt dominant hand to feed [place] and groom [place] 50% of the time    Baseline none with Rt hand, using Lt non dominant hand   Time 4   Period Weeks   Status Not Met           OT Long Term Goals - 12/06/14 1138    OT LONG TERM GOAL #1   Title Independent with updated HEP for RUE strength (DUE 12/08/14)   Time 8   Period Weeks   Status Deferred  ALL LTG's will be deferred until renewal period secondary to pain in Rt shoulder limiting ability to work towards these goals. To work on [employer] as able and revise prn for renewal period in July   [place] #2   Title Pt to perform high level reaching RUE to retrieve/replace 3 lb. object from high shelf 10 times w/o rest   Baseline only 75% shoulder flexion   Time 8   Period Weeks   Status Deferred   OT LONG TERM GOAL #3   Title Improve RUE functional use as evidenced by performing 40 blocks on Box & Blocks test    Baseline eval = 17  Time 8   Period Weeks    Status Deferred   OT LONG TERM GOAL #4   Title Improve coordination as evidenced by performing 9 hole peg test in under 90 sec.    Baseline eval: only able to place 1 peg in 60 sec.    Time 8   Period Weeks   Status Deferred   OT LONG TERM GOAL #5   Title Pt to improve grip strength Rt dominant hand to 30 lbs or greater to open jars/containers   Baseline eval = 10 lbs   Time 8   Period Weeks   Status Deferred   OT LONG TERM GOAL #6   Title Pt to return to using Rt hand as dominant hand for all BADLS 75% of the time or greater   Baseline only using LUE   Time 8   Period Weeks   Status Deferred   OT LONG TERM GOAL #7   Title Pt to write short paragraph with Rt dominant hand using A/E prn with 90% legibility    Baseline 25% legibility for first name only   Time 8   Period Weeks   Status Deferred   OT LONG TERM GOAL #8   Title Pt to return to light cooking/cleaning tasks mod I level with A/E and/or DME prn   Baseline dependent   Time 8   Period Weeks   Status Deferred               Plan - 12/06/14 1138    Clinical Impression Statement Pt making slow but steady progress toward UE pain free mid and beginning high reach; pt now also able to begining to work on resistive strengthening. Pt states she can see improvement and is pleased.   Pt will benefit from skilled therapeutic intervention in order to improve on the following deficits (Retired) Decreased coordination;Decreased range of motion;Difficulty walking;Decreased safety awareness;Decreased endurance;Increased edema;Impaired sensation;Decreased activity tolerance;Decreased knowledge of precautions;Impaired tone;Decreased balance;Decreased knowledge of use of DME;Impaired UE functional use;Pain;Decreased cognition;Decreased mobility;Decreased strength   Rehab Potential Good   Clinical Impairments Affecting Rehab Potential pain RUE, apraxia   OT Frequency 2x / week   OT Duration 8 weeks   OT Treatment/Interventions  [place]-care/ADL training;Fluidtherapy;Moist Heat;DME and/or AE instruction;Splinting;Patient/family education;Therapeutic exercises;Balance training;Therapeutic activities;Neuromuscular education;Functional Mobility Training;Passive range of motion;Cognitive remediation/compensation;Electrical Stimulation;Parrafin;Energy conservation;Manual Therapy   Plan COMPLETE RENEWAL, neuro re ed.   Consulted and Agree with Plan of Care Patient;Family member/caregiver   Family Member Consulted HUSBAND        Problem List Patient Active Problem List   Diagnosis Date Noted  . Monilial vaginitis 10/23/2014  . Deep vein thrombosis 08/12/2014  . History of inferior vena caval filter placement 07/14/2014  . Malignant glioma of brain 06/28/2014  . Diabetes mellitus, type 2 06/27/2014  . 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 Tremor of both hands 04/05/2014  . Vertigo 04/05/2014  . BPPV (benign paroxysmal positional vertigo) 03/14/2014  . Seizures 12/16/2013  . Epigastric pain 11/01/2013  . Fever blister 10/16/2013  . Unspecified hypothyroidism 12/13/2012  . Other and unspecified hyperlipidemia 12/13/2012  . Anxiety state, unspecified 12/13/2012  . Edema 12/13/2012  . Anxiety state 12/13/2012  . Adult hypothyroidism 12/13/2012  . Type II or unspecified type diabetes mellitus without mention of complication, not stated as uncontrolled   . Urinary incontinence   . DEPRESSION 09/24/2007  . Essential hypertension 09/24/2007  . GERD 09/24/2007  . HIATAL HERNIA 09/24/2007  . 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 Headache(784.0) 09/24/2007  Quay Burow, OTR/L 12/06/2014, 11:41 AM  [facility] [address] [place], [place], [address] Phone: [phone]   Fax:  [phone]

## 2014-12-10 ENCOUNTER — Ambulatory Visit: Payer: Medicare Other | Admitting: Occupational Therapy

## 2014-12-10 ENCOUNTER — Encounter: Payer: Self-pay | Admitting: Occupational Therapy

## 2014-12-10 ENCOUNTER — Encounter: Payer: Self-pay | Admitting: Physical Therapy

## 2014-12-10 ENCOUNTER — Ambulatory Visit: Payer: Medicare Other | Admitting: Physical Therapy

## 2014-12-10 DIAGNOSIS — M25511 Pain in right shoulder: Secondary | ICD-10-CM

## 2014-12-10 DIAGNOSIS — R279 Unspecified lack of coordination: Secondary | ICD-10-CM

## 2014-12-10 DIAGNOSIS — R2689 Other abnormalities of gait and mobility: Secondary | ICD-10-CM

## 2014-12-10 DIAGNOSIS — R6889 Other general symptoms and signs: Secondary | ICD-10-CM

## 2014-12-10 DIAGNOSIS — R269 Unspecified abnormalities of gait and mobility: Secondary | ICD-10-CM | POA: Diagnosis not present

## 2014-12-10 DIAGNOSIS — R29898 Other symptoms and signs involving the musculoskeletal system: Secondary | ICD-10-CM

## 2014-12-10 DIAGNOSIS — G8191 Hemiplegia, unspecified affecting right dominant side: Secondary | ICD-10-CM

## 2014-12-10 NOTE — Therapy (Signed)
[facility] [address] [place], [place], [address] Phone: [phone]   Fax:  [phone]  Occupational Therapy Treatment  Patient Details  Name: Kelly Fletcher MRN: [id number] Date of Birth: Jan 21, 1951 Referring Provider:  Estill Dooms, MD  Encounter Date: 12/10/2014      OT End of Session - 12/10/14 1436    Visit Number 15   Number of Visits 30   Date for OT Re-Evaluation 02/09/15   Authorization Type MCR - G code needed   Authorization Time Period 36 Days   Authorization - Visit Number 15   Authorization - Number of Visits 20   OT Start Time 1100   OT Stop Time 1145   OT Time Calculation (min) 45 min   Activity Tolerance Patient tolerated treatment well      Past Medical History  Diagnosis Date  . Uterine prolapse   . Cystocele   . GERD (gastroesophageal reflux disease)   . Elevated cholesterol   . Rectocele   . Hypertension   . Arthritis   . Type II or unspecified type diabetes mellitus without mention of complication, uncontrolled     Type 2  . Urinary incontinence     Urgency  . Anxiety state, unspecified   . Palpitations   . Allergic rhinitis, cause unspecified   . Other and unspecified hyperlipidemia   . Unspecified hypothyroidism   . Edema   . Insomnia, unspecified   . Depressive disorder, not elsewhere classified   . Unspecified urinary incontinence   . Peptic ulcer, unspecified site, unspecified as acute or chronic, without mention of hemorrhage, perforation, or obstruction   . Disturbance of skin sensation   . Calculus of kidney   . Facial spasm 04/05/2014  . Abnormal brain MRI 04/13/2014  . 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 Monilial vaginitis 10/23/2014    Past Surgical History  Procedure Laterality Date  . Tonsillectomy and adenoidectomy  1963  . Dilation and curettage of uterus      3-05  .  Hysteroscopy      3-05  . Endometrial biopsy      Dr.Gottsegen   . Tongue biopsy      There were no vitals filed for this visit.  Visit Diagnosis:  Pain in joint, shoulder region, right - Plan: Ot plan of care cert/re-cert  Hemiplegia affecting right dominant side - Plan: Ot plan of care cert/re-cert  Lack of coordination - Plan: Ot plan of care cert/re-cert  Weakness of right arm - Plan: Ot plan of care cert/re-cert      Subjective Assessment - 12/10/14 1107    Patient is accompained by: Family member   Pertinent History glioblastoma with brain surgery to remove tumor 06/28/14, facial seizures   Patient Stated Goals To get my Rt shoulder and hand better b/c I'm Rt handed   Currently in Pain? No/denies  just soreness RUE                      OT Treatments/Exercises (OP) - 12/10/14 0001    ADLs   ADL Comments Therapist, pt and husband discussed renewal and continuing for 8 more weeks. Discussed plan of care and revising STG's/LTG's prn   Neurological Re-education Exercises   Other Exercises 1 Supine: high level shoulder flexion initially with ball and occasional tactile and verbal cues to correct positioning for  good sh. alignment. Progressed to high level shoulder flexion with 3 # weight/3 lb. ball for sh. girdle/proximal strengthening and proprioceptive input.    Other Exercises 2 Seated: mid range bilateral shoulder flexion to eye level with min facilitation to prevent trunk and shoulder compensation with ball and 3 lb. weight.    Functional Reaching Activities   Mid Level to pick up blocks and place in container (tabletop surface and height) and cueing for correct sh. and elbow alignment, as well as for pincer grasp. Pt issued Oval 8 for thumb IP joint to prevent flexion during fine motor activities only (to improve grasp b/t thumb and index, long fingers and to prevent thumb IP flexion). Pt's husband shown how and when to don/doff                OT  Education - 12/10/14 1435    Education provided Yes   Education Details Oval 8 finger splint wearing schedule   Person(s) Educated Patient;Spouse   Methods Explanation   Comprehension Verbalized understanding          OT Short Term Goals - 12/10/14 1438    OT SHORT TERM GOAL #1   Title Independent with HEP for Rt hand coordination, RUE ROM and Rt hand strength (DUE 11/08/14)   Time 4   Period Weeks   Status Achieved  Pt has had severe pain in R shoulder therefore has not met any STG's as treatment has needed to focus on addressing pain   OT SHORT TERM GOAL #2   Title Pt to verbalize understanding with pain management strategies RUE (ongoing for renewal period - due 01/10/15)   Time 4   Period Weeks   Status On-going  able to verbalize some positioning and movement strategies   OT SHORT TERM GOAL #3   Title Pt to write name at 75% or greater legibility w/ A/E for pen prn Rt dominant hand (due 01/10/15)   Baseline approx. 25% first name only with built up pen   Time 4   Period Weeks   Status On-going   OT SHORT TERM GOAL #4   Title Improve RUE functional use as evidenced by performing 20 blocks or greater on Box & Blocks test   Baseline eval = 17, 12/04/14 = 12   Time 4   Period Weeks   Status Revised  Revised for renewal period    OT SHORT TERM GOAL #5   Title Pt to improve grip strength Rt hand to 20 lbs or greater to decrease drops   Baseline eval = 10 lbs (Lt = 55 lbs); 12/04/14 = 10 lbs   Time 4   Period Weeks   Status On-going  for renewal period   OT SHORT TERM GOAL #6   Title Pt to tie shoes and hook bra mod I level incorporating Rt dominant hand for assist   Baseline dependent   Time 4   Period Weeks   Status Deferred  Pt does not wish to work on at this time   OT [place] #7   Title Pt to use Rt dominant hand to feed [place] with finger foods 50% of the time or greater   Baseline none with Rt hand, using Lt non dominant hand   Time 4   Period Weeks   Status  Revised  for renewal period           OT Long Term Goals - 12/10/14 1444    OT LONG TERM GOAL #  1   Title Independent with updated HEP for RUE strength (DUE 02/09/15)   Time 8   Period Weeks   Status On-going   OT LONG TERM GOAL #2   Title Pt to perform high level reaching RUE to retrieve/replace light weight object from high shelf 5 times w/o rest   Baseline only 75% shoulder flexion   Time 8   Period Weeks   Status Revised   OT LONG TERM GOAL #3   Title Improve RUE functional use as evidenced by performing 30 blocks on Box & Blocks test    Baseline eval = 17, 12/04/14 = 12   Time 8   Period Weeks   Status Revised   OT LONG TERM GOAL #4   Title Improve coordination as evidenced by performing 9 hole peg test in under  2 min.    Baseline eval: only able to place 1 peg in 60 sec.    Time 8   Period Weeks   Status Revised   OT LONG TERM GOAL #5   Title Pt to improve grip strength Rt dominant hand to 20 lbs or greater to open jars/containers   Baseline eval = 10 lbs, 12/04/14 = 10 lbs   Time 8   Period Weeks   Status Revised   OT LONG TERM GOAL #6   Title Pt to return to using Rt hand as dominant hand for all BADLS 85% of the time or greater   Baseline only using LUE   Time 8   Period Weeks   Status Revised   OT LONG TERM GOAL #7   Title Pt to write sentence with Rt dominant hand using A/E prn with 75% legibility    Baseline 25% legibility for first name only   Time 8   Period Weeks   Status Revised   OT LONG TERM GOAL #8   Title Pt to return to light cooking/cleaning tasks with min assist with A/E and/or DME prn   Baseline dependent   Time 8   Period Weeks   Status Revised               Plan - 12/10/14 1450    Clinical Impression Statement Therapist revised STG's and LTG's today for renewal period. RE-CERT sent to MD for today's visit. Pt continue to make slow progress with RUE ROM/control.    Pt will benefit from skilled therapeutic intervention in order to  improve on the following deficits (Retired) Decreased cognition;Decreased knowledge of use of DME;Impaired flexibility;Pain;Decreased coordination;Decreased mobility;Impaired sensation;Improper body mechanics;Decreased activity tolerance;Decreased endurance;Decreased range of motion;Decreased strength;Impaired tone;Decreased balance;Decreased knowledge of precautions;Decreased safety awareness;Impaired UE functional use;Impaired perceived functional ability   Rehab Potential Good   OT Frequency 2x / week   OT Duration 8 weeks   OT Treatment/Interventions [place]-care/ADL training;Electrical Stimulation;Therapeutic exercise;Cognitive remediation/compensation;Moist Heat;Parrafin;Neuromuscular education;Splinting;Visual/perceptual remediation/compensation;Fluidtherapy;Energy conservation;Therapist, nutritional;Therapeutic exercises;Patient/family education;DME and/or AE instruction;Manual Therapy;Passive range of motion;Therapeutic activities   Plan Continue therapy 2x/wk for 8 more weeks (renewal completed today), continue neuro re-educ. and functional use RUE   Consulted and Agree with Plan of Care Patient;Family member/caregiver   Family Member Consulted HUSBAND        Problem List Patient Active Problem List   Diagnosis Date Noted  . Monilial vaginitis 10/23/2014  . Deep vein thrombosis 08/12/2014  . History of inferior vena caval filter placement 07/14/2014  . Malignant glioma of brain 06/28/2014  . Diabetes mellitus, type 2 06/27/2014  . 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 Tremor  of both hands 04/05/2014  . Vertigo 04/05/2014  . BPPV (benign paroxysmal positional vertigo) 03/14/2014  . Seizures 12/16/2013  . Epigastric pain 11/01/2013  . Fever blister 10/16/2013  . Unspecified hypothyroidism 12/13/2012  . Other and unspecified hyperlipidemia 12/13/2012  . Anxiety state, unspecified 12/13/2012  . Edema 12/13/2012  . Anxiety state 12/13/2012  . Adult hypothyroidism  12/13/2012  . Type II or unspecified type diabetes mellitus without mention of complication, not stated as uncontrolled   . Urinary incontinence   . DEPRESSION 09/24/2007  . Essential hypertension 09/24/2007  . GERD 09/24/2007  . HIATAL HERNIA 09/24/2007  . 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 Headache(784.0) 09/24/2007    Carey Bullocks, OTR/L  12/10/2014, 2:59 PM  [facility] [address] [place], [place], [address] Phone: [phone]   Fax:  [phone]

## 2014-12-10 NOTE — Therapy (Signed)
[facility] [address] [place], [place], [address] Phone: [phone]   Fax:  [phone]  Physical Therapy Treatment  Patient Details  Name: Kelly Fletcher MRN: [id number] Date of Birth: 1950-09-20 Referring Provider:  Estill Dooms, MD  Encounter Date: 12/10/2014      PT End of Session - 12/10/14 1358    Visit Number 14   Number of Visits 17   Date for PT Re-Evaluation 12/10/14   Authorization Type G-code & KX modifier   PT Start Time 1232   PT Stop Time 1315   PT Time Calculation (min) 43 min   Equipment Utilized During Treatment Gait belt   Activity Tolerance Patient tolerated treatment well   Behavior During Therapy [facility] for tasks assessed/performed      Past Medical History  Diagnosis Date  . Uterine prolapse   . Cystocele   . GERD (gastroesophageal reflux disease)   . Elevated cholesterol   . Rectocele   . Hypertension   . Arthritis   . Type II or unspecified type diabetes mellitus without mention of complication, uncontrolled     Type 2  . Urinary incontinence     Urgency  . Anxiety state, unspecified   . Palpitations   . Allergic rhinitis, cause unspecified   . Other and unspecified hyperlipidemia   . Unspecified hypothyroidism   . Edema   . Insomnia, unspecified   . Depressive disorder, not elsewhere classified   . Unspecified urinary incontinence   . Peptic ulcer, unspecified site, unspecified as acute or chronic, without mention of hemorrhage, perforation, or obstruction   . Disturbance of skin sensation   . Calculus of kidney   . Facial spasm 04/05/2014  . Abnormal brain MRI 04/13/2014  . 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 Monilial vaginitis 10/23/2014    Past Surgical History  Procedure Laterality Date  . Tonsillectomy and adenoidectomy  1963  . Dilation and curettage of uterus      3-05  . Hysteroscopy       3-05  . Endometrial biopsy      Kelly Fletcher   . Tongue biopsy      There were no vitals filed for this visit.  Visit Diagnosis:  Abnormality of gait  Balance problems  Decreased functional activity tolerance  Right leg weakness      Subjective Assessment - 12/10/14 1351    Subjective Pt stated that her L ankle and knee are still aggravated by certain movements, but no pain to report at beginning of session. Has appointment with foot doctor this afternoon to determine if pt needs to continue wearing ankle brace. Pt anticipates that she will need to continue to wear the brace due to ankle instability when walking on grass. No new falls.   Currently in Pain? No/denies      Treatment:  Neuro Re-Ed (min guard for safety; occasional single UE support needed; cues for sequencing, technique, and ex form): -On level surface next to counter (to assess L ankle stability prior to dynamic balance on compliant surface): marching forwards/backwards x1 lap; tandem walking forwards/backwards x1 lap (no complaints of L ankle discomfort)  -On red mat next to counter: marching forwards/backwards x5 laps (slight L hip adduction with L hip/knee flexion possibly due to tight hip adductors; cues to slow down with 3-count each step); tandem walking forwards/bacwards x3 laps; stepping over hurdles forwards  x2 laps; side stepping over hurdles x2 laps (pt c/o slight twinge in L ankle on last lap)  Therapeutic Exercise (to increase hip adductor flexibility to address gait abnormality with forward marching; cues for technique and ex form): -Supine B hip adductor stretch: 3x1 min        PT Short Term Goals - 12/06/14 1100    PT SHORT TERM GOAL #1   Title patient correcty demonstrates initial HEP with husband cueing. (Target Date: 11/09/2014)   Time 4   Period Weeks   Status Achieved   PT SHORT TERM GOAL #2   Title Timed Up & Go <13.5 sec without device (Target Date: 11/09/2014)  Current time is 8.13  seconds (11/08/14)   Time 4   Period Weeks   Status Achieved   PT SHORT TERM GOAL #3   Title Berg Balance >46/56 (Target Date: 11/09/2014)  Current Kelly Fletcher is 54/56 (11/08/14)   Time 4   Period Weeks   Status Achieved   PT SHORT TERM GOAL #4   Title ambulates 500' without device including grass surfaces, ramps & curbs with supervision. (Target Date: 11/09/2014)   Time 4   Period Weeks   Status Achieved   PT SHORT TERM GOAL #5   Title ambulates 200' without device on grass with supervision while carrying on conversation. (Target Date: 01/04/2015)   Time 4   Period Weeks   Status New   Additional Short Term Goals   Additional Short Term Goals Yes   PT SHORT TERM GOAL #6   Title Functional Gait Assessment >/= 25/30 (Target Date: 01/04/2015)   Time 4   Period Weeks   Status New           PT Long Term Goals - 12/06/14 1100    PT LONG TERM GOAL #1   Title demonstrates / verbalizes understanding of ongoing HEP / fitness plan. (UPDATED Target Date: 02/01/2015)   Baseline Met: 12/06/14 but with continued PT HEP / fitness plan will need to be progressed further.    Time 8   Period Weeks   Status On-going   PT LONG TERM GOAL #2   Title Cognitive Timed Up & Go <15 sec safely  (Target Date: 12/07/2014)   Baseline Met: 12/06/14 14.77 sec   Time 8   Period Weeks   Status Achieved   PT LONG TERM GOAL #3   Title Berg Balance >/=52/56  (UPDATED Target Date: 02/01/2015)   Baseline NOT MET 12/06/2014 Kelly Fletcher Balance 44/56    Time 8   Period Weeks   Status On-going   PT LONG TERM GOAL #4   Title Functional Gait Assessment >25/ 30  (UPDATED Target Date: 02/01/2015)   Baseline Partially MET 12/06/2014 FGA 23/30   Status On-going   PT LONG TERM GOAL #5   Title ambulates >1000' without device including grass, ramps, curbs, stairs indpendently.  (Target Date: 12/07/2014)   Baseline partially met 12/06/14; pt able to ambulate >1000' without device independently on level surfaces indoors and outdoors, but still  requires supervision for safety on grass   Time 8   Period Weeks   Status Partially Met   Additional Long Term Goals   Additional Long Term Goals Yes   PT LONG TERM GOAL #6   Title Cognitive Timed Up & Go <15% increase from standard TUG (UPDATED Target Date: 02/01/2015)   Time 8   Period Weeks   Status New   PT LONG TERM GOAL #7   Title Patient ambulates  200' on grass without device while carrying on conversation modified independent. (UPDATED Target Date: 02/01/2015)   Time 8   Period Weeks   Status New           Plan - 12/10/14 1531    Clinical Impression Statement Pt continues to be limited due to L ankle sprain that has resulted in decreased L ankle stability and L LE weakness. Deferred gait training to avoid placing too much stress on L ankle. Pt still able to participate in high level dynamic balance activities with only one instance of slight discomfort in L ankle.    Pt will benefit from skilled therapeutic intervention in order to improve on the following deficits Abnormal gait;Decreased activity tolerance;Decreased balance;Decreased endurance;Decreased strength   Rehab Potential Good   PT Frequency 1x / week   PT Duration 8 weeks   PT Treatment/Interventions ADLs/[facility];DME Instruction;Gait training;Stair training;Functional mobility training;Therapeutic activities;Therapeutic exercise;Balance training;Neuromuscular re-education;Patient/family education   PT Next Visit Plan cont with strengthening and high level balance training   PT Home Exercise Plan hip abduction strengthening for bil. LE's   Consulted and Agree with Plan of Care Patient;Family member/caregiver   Family Member Consulted husband        Problem List Patient Active Problem List   Diagnosis Date Noted  . Monilial vaginitis 10/23/2014  . Deep vein thrombosis 08/12/2014  . History of inferior vena caval filter placement 07/14/2014  . Malignant glioma of brain 06/28/2014  . Diabetes  mellitus, type 2 06/27/2014  . 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 Tremor of both hands 04/05/2014  . Vertigo 04/05/2014  . BPPV (benign paroxysmal positional vertigo) 03/14/2014  . Seizures 12/16/2013  . Epigastric pain 11/01/2013  . Fever blister 10/16/2013  . Unspecified hypothyroidism 12/13/2012  . Other and unspecified hyperlipidemia 12/13/2012  . Anxiety state, unspecified 12/13/2012  . Edema 12/13/2012  . Anxiety state 12/13/2012  . Adult hypothyroidism 12/13/2012  . Type II or unspecified type diabetes mellitus without mention of complication, not stated as uncontrolled   . Urinary incontinence   . DEPRESSION 09/24/2007  . Essential hypertension 09/24/2007  . GERD 09/24/2007  . HIATAL HERNIA 09/24/2007  . 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 Headache(784.0) 09/24/2007    Rubye Oaks 12/10/2014, 3:36 PM  [facility], [place], [place], [facility] [place] [address], [place], [place] [address] [phone] 12/11/2014, 9:25 AM

## 2014-12-11 ENCOUNTER — Other Ambulatory Visit (HOSPITAL_BASED_OUTPATIENT_CLINIC_OR_DEPARTMENT_OTHER): Payer: Medicare Other

## 2014-12-11 DIAGNOSIS — C711 Malignant neoplasm of frontal lobe: Secondary | ICD-10-CM | POA: Diagnosis present

## 2014-12-11 DIAGNOSIS — C719 Malignant neoplasm of brain, unspecified: Secondary | ICD-10-CM

## 2014-12-11 LAB — CBC WITH DIFFERENTIAL/PLATELET
BASO%: 0.9 % (ref 0.0–2.0)
BASOS ABS: 0 10*3/uL (ref 0.0–0.1)
EOS ABS: 0.1 10*3/uL (ref 0.0–0.5)
EOS%: 2.3 % (ref 0.0–7.0)
HEMATOCRIT: 38.1 % (ref 34.8–46.6)
HEMOGLOBIN: 12.7 g/dL (ref 11.6–15.9)
LYMPH%: 13.1 % — AB (ref 14.0–49.7)
MCH: 31.6 pg (ref 25.1–34.0)
MCHC: 33.2 g/dL (ref 31.5–36.0)
MCV: 95.2 fL (ref 79.5–101.0)
MONO#: 0.3 10*3/uL (ref 0.1–0.9)
MONO%: 7.7 % (ref 0.0–14.0)
NEUT#: 3.2 10*3/uL (ref 1.5–6.5)
NEUT%: 76 % (ref 38.4–76.8)
Platelets: 167 10*3/uL (ref 145–400)
RBC: 4 10*6/uL (ref 3.70–5.45)
RDW: 16.5 % — ABNORMAL HIGH (ref 11.2–14.5)
WBC: 4.2 10*3/uL (ref 3.9–10.3)
lymph#: 0.6 10*3/uL — ABNORMAL LOW (ref 0.9–3.3)

## 2014-12-12 ENCOUNTER — Telehealth: Payer: Self-pay | Admitting: *Deleted

## 2014-12-12 ENCOUNTER — Ambulatory Visit: Payer: Medicare Other | Admitting: Physical Therapy

## 2014-12-12 ENCOUNTER — Encounter: Payer: Self-pay | Admitting: Occupational Therapy

## 2014-12-12 ENCOUNTER — Ambulatory Visit: Payer: Medicare Other | Admitting: Occupational Therapy

## 2014-12-12 ENCOUNTER — Other Ambulatory Visit: Payer: Self-pay | Admitting: *Deleted

## 2014-12-12 DIAGNOSIS — R279 Unspecified lack of coordination: Secondary | ICD-10-CM

## 2014-12-12 DIAGNOSIS — R269 Unspecified abnormalities of gait and mobility: Secondary | ICD-10-CM | POA: Diagnosis not present

## 2014-12-12 DIAGNOSIS — R29898 Other symptoms and signs involving the musculoskeletal system: Secondary | ICD-10-CM

## 2014-12-12 MED ORDER — TEMOZOLOMIDE 140 MG PO CAPS: 280.0000 mg | ORAL_CAPSULE | Freq: Every day | ORAL | Status: DC

## 2014-12-12 MED ORDER — TEMOZOLOMIDE 100 MG PO CAPS: 100.0000 mg | ORAL_CAPSULE | Freq: Every day | ORAL | Status: DC

## 2014-12-12 NOTE — Telephone Encounter (Signed)
-----   Message from Ladell Pier, MD sent at 12/11/2014  8:07 PM EDT ----- Please call patient, platelets have recovered, ok to begin next cycle of Temodar. We can prescribe if she does not already have a refill from [place].  Same dose as last cycle.

## 2014-12-12 NOTE — Addendum Note (Signed)
Addended by: Brien Few on: 12/12/2014 02:07 PM   Modules accepted: Orders

## 2014-12-12 NOTE — Telephone Encounter (Signed)
Called pt with CBC results. She will need refill ordered. Rx sent to [facility] outpatient pharmacy. She reports she had no co-pay when filled at [facility]. Will have refilled there if there is an out of pocket cost from [employer].

## 2014-12-12 NOTE — Therapy (Signed)
[facility] [address] [place], [place], [address] Phone: [phone]   Fax:  [phone]  Occupational Therapy Treatment  Patient Details  Name: Kelly Fletcher MRN: [id number] Date of Birth: Sep 18, 1950 Referring Provider:  Estill Dooms, MD  Encounter Date: 12/12/2014      OT End of Session - 12/12/14 1341    Visit Number 16   Number of Visits 30   Date for OT Re-Evaluation 02/09/15   Authorization Type MCR - G code needed   Authorization Time Period 19 Days   Authorization - Visit Number 16   Authorization - Number of Visits 20   OT Start Time 1100   OT Stop Time 1150   OT Time Calculation (min) 50 min   Activity Tolerance Patient tolerated treatment well      Past Medical History  Diagnosis Date  . Uterine prolapse   . Cystocele   . GERD (gastroesophageal reflux disease)   . Elevated cholesterol   . Rectocele   . Hypertension   . Arthritis   . Type II or unspecified type diabetes mellitus without mention of complication, uncontrolled     Type 2  . Urinary incontinence     Urgency  . Anxiety state, unspecified   . Palpitations   . Allergic rhinitis, cause unspecified   . Other and unspecified hyperlipidemia   . Unspecified hypothyroidism   . Edema   . Insomnia, unspecified   . Depressive disorder, not elsewhere classified   . Unspecified urinary incontinence   . Peptic ulcer, unspecified site, unspecified as acute or chronic, without mention of hemorrhage, perforation, or obstruction   . Disturbance of skin sensation   . Calculus of kidney   . Facial spasm 04/05/2014  . Abnormal brain MRI 04/13/2014  . 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 Monilial vaginitis 10/23/2014    Past Surgical History  Procedure Laterality Date  . Tonsillectomy and adenoidectomy  1963  . Dilation and curettage of uterus      3-05  .  Hysteroscopy      3-05  . Endometrial biopsy      Dr.Gottsegen   . Tongue biopsy      There were no vitals filed for this visit.  Visit Diagnosis:  Lack of coordination  Weakness of right arm  Weakness of right hand      Subjective Assessment - 12/12/14 1112    Patient is accompained by: Family member  Husband   Pertinent History glioblastoma with brain surgery to remove tumor 06/28/14, facial seizures   Patient Stated Goals To get my Rt shoulder and hand better b/c I'm Rt handed   Currently in Pain? No/denies  only soreness at times RUE                      OT Treatments/Exercises (OP) - 12/12/14 1113    ADLs   Eating Simulated eating task: with various utensils and upper arm supported, but pt unable to maintain spoon/fork level and causing shoulder pain, therefore d/c task   Writing Practiced tracing shapes with highlighter for pre-writing exercise with decreased control and highlighter slipping from fingers. Wrapped with coban to prevent slipping which seemed to help with slipping but pt still had difficulty with control. Then progressed to writing name in print with 50-75% legibility.   Fine Motor Coordination   Fine Motor  Coordination Flipping cards   Flipping cards large cards for supination and thumb pincer grasp with min to mod difficulty.    Splinting   Splinting Pt fitted for neoprene thumb CMC splint secondary to pt/husband reporting thumb adduction, even at night. Fitted and issued splint for improved thumb position. Pt to wear during day prn, and at night (with compression glove underneath prn)                OT Education - 12/12/14 1340    Education provided Yes   Education Details Neoprene splint wear and care    Person(s) Educated Patient;Spouse   Methods Explanation   Comprehension Verbalized understanding          OT Short Term Goals - 12/10/14 1438    OT SHORT TERM GOAL #1   Title Independent with HEP for Rt hand  coordination, RUE ROM and Rt hand strength (DUE 11/08/14)   Time 4   Period Weeks   Status Achieved  Pt has had severe pain in R shoulder therefore has not met any STG's as treatment has needed to focus on addressing pain   OT SHORT TERM GOAL #2   Title Pt to verbalize understanding with pain management strategies RUE (ongoing for renewal period - due 01/10/15)   Time 4   Period Weeks   Status On-going  able to verbalize some positioning and movement strategies   OT SHORT TERM GOAL #3   Title Pt to write name at 75% or greater legibility w/ A/E for pen prn Rt dominant hand (due 01/10/15)   Baseline approx. 25% first name only with built up pen   Time 4   Period Weeks   Status On-going   OT SHORT TERM GOAL #4   Title Improve RUE functional use as evidenced by performing 20 blocks or greater on Box & Blocks test   Baseline eval = 17, 12/04/14 = 12   Time 4   Period Weeks   Status Revised  Revised for renewal period    OT SHORT TERM GOAL #5   Title Pt to improve grip strength Rt hand to 20 lbs or greater to decrease drops   Baseline eval = 10 lbs (Lt = 55 lbs); 12/04/14 = 10 lbs   Time 4   Period Weeks   Status On-going  for renewal period   OT SHORT TERM GOAL #6   Title Pt to tie shoes and hook bra mod I level incorporating Rt dominant hand for assist   Baseline dependent   Time 4   Period Weeks   Status Deferred  Pt does not wish to work on at this time   OT Ovid #7   Title Pt to use Rt dominant hand to feed Kokesh with finger foods 50% of the time or greater   Baseline none with Rt hand, using Lt non dominant hand   Time 4   Period Weeks   Status Revised  for renewal period           OT Long Term Goals - 12/10/14 1444    OT LONG TERM GOAL #1   Title Independent with updated HEP for RUE strength (DUE 02/09/15)   Time 8   Period Weeks   Status On-going   OT LONG TERM GOAL #2   Title Pt to perform high level reaching RUE to retrieve/replace light weight object  from high shelf 5 times w/o rest   Baseline only 75% shoulder flexion  Time 8   Period Weeks   Status Revised   OT LONG TERM GOAL #3   Title Improve RUE functional use as evidenced by performing 30 blocks on Box & Blocks test    Baseline eval = 17, 12/04/14 = 12   Time 8   Period Weeks   Status Revised   OT LONG TERM GOAL #4   Title Improve coordination as evidenced by performing 9 hole peg test in under  2 min.    Baseline eval: only able to place 1 peg in 60 sec.    Time 8   Period Weeks   Status Revised   OT LONG TERM GOAL #5   Title Pt to improve grip strength Rt dominant hand to 20 lbs or greater to open jars/containers   Baseline eval = 10 lbs, 12/04/14 = 10 lbs   Time 8   Period Weeks   Status Revised   OT LONG TERM GOAL #6   Title Pt to return to using Rt hand as dominant hand for all BADLS 94% of the time or greater   Baseline only using LUE   Time 8   Period Weeks   Status Revised   OT LONG TERM GOAL #7   Title Pt to write sentence with Rt dominant hand using A/E prn with 75% legibility    Baseline 25% legibility for first name only   Time 8   Period Weeks   Status Revised   OT LONG TERM GOAL #8   Title Pt to return to light cooking/cleaning tasks with min assist with A/E and/or DME prn   Baseline dependent   Time 8   Period Weeks   Status Revised               Plan - 12/12/14 1342    Clinical Impression Statement Pt progressing slowly with pre-writing skills and low range reaching.    Plan NMR and functional reaching RUE   Consulted and Agree with Plan of Care Patient;Family member/caregiver   Family Member Consulted HUSBAND        Problem List Patient Active Problem List   Diagnosis Date Noted  . Monilial vaginitis 10/23/2014  . Deep vein thrombosis 08/12/2014  . History of inferior vena caval filter placement 07/14/2014  . Malignant glioma of brain 06/28/2014  . Diabetes mellitus, type 2 06/27/2014  . Focal motor seizure disorder  05/04/2014  . Tremor of both hands 04/05/2014  . Vertigo 04/05/2014  . BPPV (benign paroxysmal positional vertigo) 03/14/2014  . Seizures 12/16/2013  . Epigastric pain 11/01/2013  . Fever blister 10/16/2013  . Unspecified hypothyroidism 12/13/2012  . Other and unspecified hyperlipidemia 12/13/2012  . Anxiety state, unspecified 12/13/2012  . Edema 12/13/2012  . Anxiety state 12/13/2012  . Adult hypothyroidism 12/13/2012  . Type II or unspecified type diabetes mellitus without mention of complication, not stated as uncontrolled   . Urinary incontinence   . DEPRESSION 09/24/2007  . Essential hypertension 09/24/2007  . GERD 09/24/2007  . HIATAL HERNIA 09/24/2007  . 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 Headache(784.0) 09/24/2007    Carey Bullocks, OTR/L 12/12/2014, 1:43 PM  [facility] [address] [place], [place], [address] Phone: [phone]   Fax:  [phone]

## 2014-12-17 ENCOUNTER — Encounter: Payer: Self-pay | Admitting: Occupational Therapy

## 2014-12-17 ENCOUNTER — Ambulatory Visit: Payer: Medicare Other | Admitting: Physical Therapy

## 2014-12-17 ENCOUNTER — Encounter: Payer: Self-pay | Admitting: Physical Therapy

## 2014-12-17 ENCOUNTER — Ambulatory Visit: Payer: Medicare Other

## 2014-12-17 ENCOUNTER — Ambulatory Visit: Payer: Medicare Other | Admitting: Occupational Therapy

## 2014-12-17 DIAGNOSIS — R269 Unspecified abnormalities of gait and mobility: Secondary | ICD-10-CM

## 2014-12-17 DIAGNOSIS — R6889 Other general symptoms and signs: Secondary | ICD-10-CM

## 2014-12-17 DIAGNOSIS — R29898 Other symptoms and signs involving the musculoskeletal system: Secondary | ICD-10-CM

## 2014-12-17 DIAGNOSIS — R2681 Unsteadiness on feet: Secondary | ICD-10-CM

## 2014-12-17 DIAGNOSIS — G8191 Hemiplegia, unspecified affecting right dominant side: Secondary | ICD-10-CM

## 2014-12-17 DIAGNOSIS — R279 Unspecified lack of coordination: Secondary | ICD-10-CM

## 2014-12-17 DIAGNOSIS — R4701 Aphasia: Secondary | ICD-10-CM

## 2014-12-17 DIAGNOSIS — R4189 Other symptoms and signs involving cognitive functions and awareness: Secondary | ICD-10-CM

## 2014-12-17 DIAGNOSIS — R2689 Other abnormalities of gait and mobility: Secondary | ICD-10-CM

## 2014-12-17 NOTE — Patient Instructions (Signed)
You wanted more tongue twisters to practice your speech. Here they are.  Unless otherwise noted, try each 2-3 times, once to twice a day.   How can a clam cram in a clean cream can? I scream, you scream, we all scream for ice cream I saw Susie sitting in a shoeshine shop Susie works in a [employer]. Where she shines she sits, and where she sits she shines Fuzzy Rosalee Kaufman was a bear. Fuzzy Wuzzy had no hair. Fuzzy Wuzzy wasn't fuzzy, was he? Can you can a can as a canner can can a can? I have got a date at a quarter to eight; I'll see you at the gate, so don't be late You know [place], you need [place], you know you need unique [place] I saw a kitten eating chicken in the kitchen If a dog chews shoes, whose shoes does he choose? I thought I thought of thinking of thanking you I wish to wash my [place] Near an ear, a nearer ear, a nearly eerie ear Eddie edited it Willie's really weary A big black bear sat on a big black rug Tom threw Tim three thumbtacks He threw three free throws Nine nice night nurses nursing nicely So, this is the sushi chef Four fine fresh fish for you Patrick Jupiter went to [place] to watch walruses Six sticky skeletons (x3) Which witch is which? (x3) Snap crackle pop (x3) Flash message (x3) Red Buick, blue Buick (x3) Red lorry, yellow lorry (x3) Thin sticks, thick bricks (x3) Stupid superstition (x3) Eleven benevolent elephants (x3) Two tried and true tridents (x3) Rolling red wagons (x3) Black back bat (x3) She sees cheese (x3) Truly rural (x3) Good blood, bad blood (x3) Pre-shrunk silk shirts (x3) Ed had edited it. (x3) We surely shall see the sun shine soon Which wristwatches are Swiss wristwatches? Fred fed [employer], and [employer] I slit the sheet, the sheet I slit, and on the slitted sheet I sit A skunk sat on a stump and thunk the stump stunk, but the stump thunk the skunk stunk Midwife never weathered wetter weather  better

## 2014-12-17 NOTE — Therapy (Signed)
[facility] [address] [place], [place], [address] Phone: [phone]   Fax:  [phone]  Speech Language Pathology Treatment  Patient Details  Name: Kelly Fletcher MRN: [id number] Date of Birth: 1951-03-05 Referring Provider:  Estill Dooms, MD  Encounter Date: 12/17/2014      End of Session - 12/17/14 1153    Visit Number 12   Number of Visits 16   Date for SLP Re-Evaluation 12/08/14   SLP Start Time 1103   SLP Stop Time  1145   SLP Time Calculation (min) 42 min   Activity Tolerance Patient tolerated treatment well      Past Medical History  Diagnosis Date  . Uterine prolapse   . Cystocele   . GERD (gastroesophageal reflux disease)   . Elevated cholesterol   . Rectocele   . Hypertension   . Arthritis   . Type II or unspecified type diabetes mellitus without mention of complication, uncontrolled     Type 2  . Urinary incontinence     Urgency  . Anxiety state, unspecified   . Palpitations   . Allergic rhinitis, cause unspecified   . Other and unspecified hyperlipidemia   . Unspecified hypothyroidism   . Edema   . Insomnia, unspecified   . Depressive disorder, not elsewhere classified   . Unspecified urinary incontinence   . Peptic ulcer, unspecified site, unspecified as acute or chronic, without mention of hemorrhage, perforation, or obstruction   . Disturbance of skin sensation   . Calculus of kidney   . Facial spasm 04/05/2014  . Abnormal brain MRI 04/13/2014  . 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 Monilial vaginitis 10/23/2014    Past Surgical History  Procedure Laterality Date  . Tonsillectomy and adenoidectomy  1963  . Dilation and curettage of uterus      3-05  . Hysteroscopy      3-05  . Endometrial biopsy      Dr.Gottsegen   . Tongue biopsy      There were no vitals filed for this visit.  Visit Diagnosis:  Aphasia      Subjective Assessment - 12/17/14 1108    Subjective "Some days it's good and some days it's bad. (pt, re: her speech)"               ADULT SLP TREATMENT - 12/17/14 1109    General Information   Behavior/Cognition Alert;Cooperative;Pleasant mood  tearful re: deficits   Treatment Provided   Treatment provided Cognitive-Linquistic   Pain Assessment   Pain Assessment No/denies pain   Cognitive-Linquistic Treatment   Treatment focused on Apraxia   Skilled Treatment SLP facilitated practice with pt reduction in rate. Sentence tasks completed with approx 85% success. In conversation tasks, pt success and cueing level relatively unchanged from last session. Pt with average rating of her speech at 7.5/10 with 10 being normal. SLP and pt discussed discharge today, as pt stated she could complete practice for reduced speech rate at home. SLp provided more worksheets for pt to practice at home (multisyllable, tongue twisters) as she stated she thought these were very helpful.    Assessment / Recommendations / Plan   Plan Discharge SLP treatment due to (comment)  patient request (pt cont practice for ST at home)   Progression Toward Goals   Progression toward goals --  discharge today  SLP Education - 12-20-14 1141    Education provided Yes   Education Details home tasks for apraxia/aphasia   Person(s) Educated Patient   Methods Explanation;Handout   Comprehension Verbalized understanding          SLP Short Term Goals - 11/21/14 1235    SLP SHORT TERM GOAL #1   Title pt will complete HEP for motor speech planning with slow rate 95% success with rare verbal cues   Time 1   Period Weeks   Status Achieved   SLP SHORT TERM GOAL #2   Title pt will produce sentences using speech compensations with 85% fluency   Time 1   Status Achieved   SLP SHORT TERM GOAL #3   Title pt will tell SLP three compensations for improving motor planning for speech   Time 1    Status Achieved   SLP SHORT TERM GOAL #4   Title pt to participate in 5 minutes simple conversation with 85% fluency with modified independence   Time 1   Period Weeks   Status Achieved          SLP Long Term Goals - 2014-12-20 1155    SLP LONG TERM GOAL #1   Title pt will participate in 10 mintues mod complex conversation 90% fluency, with modified independence (compensations)   Status Achieved   SLP LONG TERM GOAL #2   Title pt will complete HEP with 90% fluency and modifed independence (compensations)   Status Achieved   SLP LONG TERM GOAL #3   Title pt will participate in 7 minutes complex conversation with 85% fluency with modified independence   Status Achieved   SLP LONG TERM GOAL #4   Title pt will Buras-rate her speech (in 2 to 3-minute intervals) average 9.0 over 2 sessions   Time 5   Period Weeks   Status Not Met          Plan - 2014-12-20 1153    Clinical Impression Statement Pt asked SLP what else could be done to help pt with speech fluency during today's session. Pt stated she would like to cont with ST work at home and discharge today. SLP agreed this would be best, given pt's desire to focus on OT/PT goals and abilities.   Treatment/Interventions Cueing hierarchy;Functional tasks;Patient/family education;Compensatory strategies;SLP instruction and feedback   Potential to Achieve Goals Fair   Potential Considerations Co-morbidities;Severity of impairments   SLP Home Exercise Plan provided pt with more today upon her request   Consulted and Agree with Plan of Care Patient          G-Codes - 20-Dec-2014 1156    Functional Assessment Tool Used noms   Functional Limitations Motor speech   Motor Speech Goal Status [phone]) At least 20 percent but less than 40 percent impaired, limited or restricted   Motor Speech Goal Status (T2446) At least 20 percent but less than 40 percent impaired, limited or restricted      [place]  Visits from  Start of Care: 12  Current functional level related to goals / functional outcomes: See above under "SLP Short term goals" and "SLP long term goals" for progress with ST goals. In general pt's speech fluidity improved in her Broca's Aphasia/apraxia by her ability to reduce her speech rate. She stated she would like to cont with practice of rate reduction at home due to focusing on OT/PT goals at this time.   Remaining deficits: Mild--mod Broca's Aphasia, depending upon linguistic complexity,  pt emotional level, as well as pt focus on compensations (rate reduction).   Education / Equipment: Compensations for aphasia , home exercises. Plan: Patient agrees to discharge.  Patient goals were partially met. Patient is being discharged due to the patient's request.  ?????   One goal will be in force for today's session, below: pt will Rost-rate her speech (in 2 to 3-minute intervals) average 9.0 over 2 sessions As stated above, this goal was not met today.     Problem List Patient Active Problem List   Diagnosis Date Noted  . Monilial vaginitis 10/23/2014  . Deep vein thrombosis 08/12/2014  . History of inferior vena caval filter placement 07/14/2014  . Malignant glioma of brain 06/28/2014  . Diabetes mellitus, type 2 06/27/2014  . 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 Tremor of both hands 04/05/2014  . Vertigo 04/05/2014  . BPPV (benign paroxysmal positional vertigo) 03/14/2014  . Seizures 12/16/2013  . Epigastric pain 11/01/2013  . Fever blister 10/16/2013  . Unspecified hypothyroidism 12/13/2012  . Other and unspecified hyperlipidemia 12/13/2012  . Anxiety state, unspecified 12/13/2012  . Edema 12/13/2012  . Anxiety state 12/13/2012  . Adult hypothyroidism 12/13/2012  . Type II or unspecified type diabetes mellitus without mention of complication, not stated as uncontrolled   . Urinary incontinence   . DEPRESSION 09/24/2007  . Essential hypertension 09/24/2007  . GERD  09/24/2007  . HIATAL HERNIA 09/24/2007  . 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 Headache(784.0) 09/24/2007    SCHINKE,CARL , [place], [place]   12/17/2014, 11:58 AM  [facility] [address] [place], [place], [address] Phone: [phone]   Fax:  [phone]

## 2014-12-17 NOTE — Therapy (Signed)
[facility] [address] [place], [place], [address] Phone: [phone]   Fax:  [phone]  Physical Therapy Treatment  Patient Details  Name: Kelly Fletcher MRN: [id number] Date of Birth: 12-04-1950 Referring Provider:  Estill Dooms, MD  Encounter Date: 12/17/2014      PT End of Session - 12/17/14 1230    Visit Number 15   Number of Visits 22   Date for PT Re-Evaluation 02/05/15   Authorization Type G-code & KX modifier   PT Start Time 1230   PT Stop Time 1315   PT Time Calculation (min) 45 min   Equipment Utilized During Treatment Gait belt   Activity Tolerance Patient tolerated treatment well   Behavior During Therapy [facility] for tasks assessed/performed      Past Medical History  Diagnosis Date  . Uterine prolapse   . Cystocele   . GERD (gastroesophageal reflux disease)   . Elevated cholesterol   . Rectocele   . Hypertension   . Arthritis   . Type II or unspecified type diabetes mellitus without mention of complication, uncontrolled     Type 2  . Urinary incontinence     Urgency  . Anxiety state, unspecified   . Palpitations   . Allergic rhinitis, cause unspecified   . Other and unspecified hyperlipidemia   . Unspecified hypothyroidism   . Edema   . Insomnia, unspecified   . Depressive disorder, not elsewhere classified   . Unspecified urinary incontinence   . Peptic ulcer, unspecified site, unspecified as acute or chronic, without mention of hemorrhage, perforation, or obstruction   . Disturbance of skin sensation   . Calculus of kidney   . Facial spasm 04/05/2014  . Abnormal brain MRI 04/13/2014  . 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 Monilial vaginitis 10/23/2014    Past Surgical History  Procedure Laterality Date  . Tonsillectomy and adenoidectomy  1963  . Dilation and curettage of uterus      3-05  . Hysteroscopy       3-05  . Endometrial biopsy      Dr.Gottsegen   . Tongue biopsy      There were no vitals filed for this visit.  Visit Diagnosis:  Abnormality of gait  Balance problems  Decreased functional activity tolerance  Right leg weakness  Unsteadiness      Subjective Assessment - 12/17/14 1228    Subjective Orthopedic MD said to use ankle brace when leaving the house for 2 weeks then discontinue use. No issues when ambulating in home without it. No pain. Using ice daily still.   Currently in Pain? No/denies                  Neuromuscular Re-education: Initiated activities with single activity & progressed to incorporate with gait / balance adding cognitive function.  Patient tossing ball from one hand to other, while ambulating without ankle brace carrying conversation. Standing without UE support, rolling tennis ball alternating LEs with SBA. Standing crossway on foam beam, tossing ball with RUE to husband standing 5' in front while performing cognitive task; standing with eyes open with head movements;  Alternate stepping to side wt shifting with LE external rotation. Then progressed to 90* to walk to side: step to side with external rotation, wt shift onto LE and then step with contralateral LE.       Melvin  Adult PT Treatment/Exercise - 12/17/14 1230    Ambulation/Gait   Ambulation/Gait Yes   Ambulation/Gait Assistance 5: Supervision   Ambulation/Gait Assistance Details working on multi-tasking performing UE activity & conversation while ambulating   Ambulation Distance (Feet) 1000 Feet   Assistive device None   Gait Pattern Step-through pattern   Ambulation Surface Indoor;Level   Ramp 5: Supervision   Ramp Details (indicate cue type and reason) cues on posture   Curb 5: Supervision   Curb Details (indicate cue type and reason) cues on technique   Standardized Balance Assessment   Standardized Balance Assessment --   Berg Balance Test   Sit to Stand --    Standing Unsupported --   Sitting with Back Unsupported but Feet Supported on Floor or Stool --   Stand to Sit --   Transfers --   Standing Unsupported with Eyes Closed --   Standing Ubsupported with Feet Together --   From Standing, Reach Forward with Outstretched Arm --   From Standing Position, Pick up Object from Floor --   From Standing Position, Turn to Look Behind Over each Shoulder --   Turn 360 Degrees --   Standing Unsupported, Alternately Place Feet on Step/Stool --   Standing Unsupported, One Foot in Front --   Standing on One Leg --   Total Score --                PT Education - 12/17/14 1230    Education provided Yes   Education Details initiating single activity then progressing that activity into balance & gait activity, dancing with husband    Person(s) Educated Patient;Spouse   Methods Explanation;Demonstration;Verbal cues   Comprehension Verbalized understanding;Returned demonstration;Verbal cues required;Need further instruction          PT Short Term Goals - 12/06/14 1100    PT SHORT TERM GOAL #1   Title patient correcty demonstrates initial HEP with husband cueing. (Target Date: 11/09/2014)   Time 4   Period Weeks   Status Achieved   PT SHORT TERM GOAL #2   Title Timed Up & Go <13.5 sec without device (Target Date: 11/09/2014)  Current time is 8.13 seconds (11/08/14)   Time 4   Period Weeks   Status Achieved   PT SHORT TERM GOAL #3   Title Berg Balance >46/56 (Target Date: 11/09/2014)  Current [name] is 54/56 (11/08/14)   Time 4   Period Weeks   Status Achieved   PT SHORT TERM GOAL #4   Title ambulates 500' without device including grass surfaces, ramps & curbs with supervision. (Target Date: 11/09/2014)   Time 4   Period Weeks   Status Achieved   PT SHORT TERM GOAL #5   Title ambulates 200' without device on grass with supervision while carrying on conversation. (Target Date: 01/04/2015)   Time 4   Period Weeks   Status New   Additional  Short Term Goals   Additional Short Term Goals Yes   PT SHORT TERM GOAL #6   Title Functional Gait Assessment >/= 25/30 (Target Date: 01/04/2015)   Time 4   Period Weeks   Status New           PT Long Term Goals - 12/06/14 1100    PT LONG TERM GOAL #1   Title demonstrates / verbalizes understanding of ongoing HEP / fitness plan. (UPDATED Target Date: 02/01/2015)   Baseline Met: 12/06/14 but with continued PT HEP / fitness plan will need to be progressed further.  Time 8   Period Weeks   Status On-going   PT LONG TERM GOAL #2   Title Cognitive Timed Up & Go <15 sec safely  (Target Date: 12/07/2014)   Baseline Met: 12/06/14 14.77 sec   Time 8   Period Weeks   Status Achieved   PT LONG TERM GOAL #3   Title Berg Balance >/=52/56  (UPDATED Target Date: 02/01/2015)   Baseline NOT MET 12/06/2014 [name] Balance 44/56    Time 8   Period Weeks   Status On-going   PT LONG TERM GOAL #4   Title Functional Gait Assessment >25/ 30  (UPDATED Target Date: 02/01/2015)   Baseline Partially MET 12/06/2014 FGA 23/30   Status On-going   PT LONG TERM GOAL #5   Title ambulates >1000' without device including grass, ramps, curbs, stairs indpendently.  (Target Date: 12/07/2014)   Baseline partially met 12/06/14; pt able to ambulate >1000' without device independently on level surfaces indoors and outdoors, but still requires supervision for safety on grass   Time 8   Period Weeks   Status Partially Met   Additional Long Term Goals   Additional Long Term Goals Yes   PT LONG TERM GOAL #6   Title Cognitive Timed Up & Go <15% increase from standard TUG (UPDATED Target Date: 02/01/2015)   Time 8   Period Weeks   Status New   PT LONG TERM GOAL #7   Title Patient ambulates 200' on grass without device while carrying on conversation modified independent. (UPDATED Target Date: 02/01/2015)   Time 8   Period Weeks   Status New               Plan - 12/17/14 1230    Clinical Impression Statement Patient and husband  appear to understand how to initiate single activity then incoorporate into functional activity. Patient's left ankle had no issues with activities without ankle brace.    Pt will benefit from skilled therapeutic intervention in order to improve on the following deficits Abnormal gait;Decreased activity tolerance;Decreased balance;Decreased endurance;Decreased strength   Rehab Potential Good   PT Frequency 1x / week   PT Duration 8 weeks   PT Treatment/Interventions ADLs/[facility];DME Instruction;Gait training;Stair training;Functional mobility training;Therapeutic activities;Therapeutic exercise;Balance training;Neuromuscular re-education;Patient/family education   PT Next Visit Plan cont with strengthening and high level balance training   PT Home Exercise Plan hip abduction strengthening for bil. LE's   Consulted and Agree with Plan of Care Patient;Family member/caregiver   Family Member Consulted husband        Problem List Patient Active Problem List   Diagnosis Date Noted  . Monilial vaginitis 10/23/2014  . Deep vein thrombosis 08/12/2014  . History of inferior vena caval filter placement 07/14/2014  . Malignant glioma of brain 06/28/2014  . Diabetes mellitus, type 2 06/27/2014  . 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 Tremor of both hands 04/05/2014  . Vertigo 04/05/2014  . BPPV (benign paroxysmal positional vertigo) 03/14/2014  . Seizures 12/16/2013  . Epigastric pain 11/01/2013  . Fever blister 10/16/2013  . Unspecified hypothyroidism 12/13/2012  . Other and unspecified hyperlipidemia 12/13/2012  . Anxiety state, unspecified 12/13/2012  . Edema 12/13/2012  . Anxiety state 12/13/2012  . Adult hypothyroidism 12/13/2012  . Type II or unspecified type diabetes mellitus without mention of complication, not stated as uncontrolled   . Urinary incontinence   . DEPRESSION 09/24/2007  . Essential hypertension 09/24/2007  . GERD 09/24/2007  . HIATAL HERNIA  09/24/2007  .  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 JLLVDIXV(855.0) 09/24/2007    Fate Caster PT, DPT 12/17/2014, 2:11 PM  [facility] [address] [place], [place], [address] Phone: [phone]   Fax:  [phone]

## 2014-12-17 NOTE — Therapy (Signed)
[facility] [address] [place], [place], [address] Phone: [phone]   Fax:  [phone]  Occupational Therapy Treatment  Patient Details  Name: Kelly Fletcher MRN: [id number] Date of Birth: 1951/04/04 Referring Provider:  Estill Dooms, MD  Encounter Date: 12/17/2014      OT End of Session - 12/17/14 1248    Visit Number 17   Number of Visits 30   Date for OT Re-Evaluation 02/09/15   Authorization Type MCR - G code needed   Authorization Time Period 60 Days   OT Start Time 1147   OT Stop Time 1230   OT Time Calculation (min) 43 min   Activity Tolerance Patient tolerated treatment well      Past Medical History  Diagnosis Date  . Uterine prolapse   . Cystocele   . GERD (gastroesophageal reflux disease)   . Elevated cholesterol   . Rectocele   . Hypertension   . Arthritis   . Type II or unspecified type diabetes mellitus without mention of complication, uncontrolled     Type 2  . Urinary incontinence     Urgency  . Anxiety state, unspecified   . Palpitations   . Allergic rhinitis, cause unspecified   . Other and unspecified hyperlipidemia   . Unspecified hypothyroidism   . Edema   . Insomnia, unspecified   . Depressive disorder, not elsewhere classified   . Unspecified urinary incontinence   . Peptic ulcer, unspecified site, unspecified as acute or chronic, without mention of hemorrhage, perforation, or obstruction   . Disturbance of skin sensation   . Calculus of kidney   . Facial spasm 04/05/2014  . Abnormal brain MRI 04/13/2014  . 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 Monilial vaginitis 10/23/2014    Past Surgical History  Procedure Laterality Date  . Tonsillectomy and adenoidectomy  1963  . Dilation and curettage of uterus      3-05  . Hysteroscopy      3-05  . Endometrial biopsy      Dr.Gottsegen   . Tongue biopsy       There were no vitals filed for this visit.  Visit Diagnosis:  Lack of coordination  Weakness of right arm  Weakness of right hand  Hemiplegia affecting right dominant side  Cognitive deficits      Subjective Assessment - 12/17/14 1149    Subjective  I have some muscle soreness but no pain at all.   Pertinent History glioblastoma with brain surgery to remove tumor 06/28/14, facial seizures   Patient Stated Goals To get my Rt shoulder and hand better b/c I'm Rt handed   Currently in Pain? No/denies                      OT Treatments/Exercises (OP) - 12/17/14 0001    Neurological Re-education Exercises   Other Exercises 1 Neuro re ed to address full range of low to high reach in closed chain activity in sitting - pt initially needed mod facilitation above 80* of flexion but by end of session pt only needing light min facilitation to approximately 135* of bilateral closed chain reach with no pain.  Also addressed scapular stability, strength and stable mobility in prone as well as sitting using resistance. Pt with improved ability for isolating movement as well as tolerating manual resistance.  Then incoporated unilateral reach  to mid level with incorporation of functional task to include reach, grasp and release. Pt needs  min facilitation and mod vc's to decrease compensations and for motor planning as well as to use vision to coompensate for sensory loss.                   OT Short Term Goals - 12/17/14 1243    OT SHORT TERM GOAL #2   Title Pt to verbalize understanding with pain management strategies RUE (ongoing for renewal period - due 01/10/15)   Status On-going   OT SHORT TERM GOAL #3   Title Pt to write name at 75% or greater legibility w/ A/E for pen prn Rt dominant hand (due 01/10/15)   Baseline approx. 25% first name only with built up pen   Status On-going   OT SHORT TERM GOAL #4   Title Improve RUE functional use as evidenced by performing 20  blocks or greater on Box & Blocks test   Baseline eval = 17, 12/04/14 = 12   Status On-going   OT SHORT TERM GOAL #5   Title Pt to improve grip strength Rt hand to 15 lbs or greater to decrease drops   Baseline eval = 10 lbs (Lt = 55 lbs); 12/04/14 = 10 lbs   Status Revised           OT Long Term Goals - 12/17/14 1245    OT LONG TERM GOAL #1   Title Independent with updated HEP for RUE strength (DUE 02/09/15)   Status On-going   OT LONG TERM GOAL #2   Title Pt to perform high level reaching RUE to retrieve/replace light weight object from high shelf 5 times w/o rest and min compensations   Baseline only 75% shoulder flexion   Status Revised   OT LONG TERM GOAL #3   Title Improve RUE functional use as evidenced by performing 30 blocks on Box & Blocks test    Status On-going   OT LONG TERM GOAL #4   Title Improve coordination as evidenced by performing 9 hole peg test in under  2 min.    Baseline eval: only able to place 1 peg in 60 sec.    Status On-going   OT LONG TERM GOAL #5   Title Pt to improve grip strength Rt dominant hand to 20 lbs or greater to open jars/containers   Baseline eval = 10 lbs, 12/04/14 = 10 lbs   Status On-going   OT LONG TERM GOAL #6   Title Pt to return to using Rt hand as dominant hand for all BADLS 35% of the time or greater   Baseline only using LUE   Status On-going   OT LONG TERM GOAL #7   Title Pt to write sentence with Rt dominant hand using A/E prn with 75% legibility    Baseline 25% legibility for first name only   Status On-going   OT LONG TERM GOAL #8   Title Pt to return to light cooking/cleaning tasks with min assist with A/E and/or DME prn   Baseline dependent   Status On-going               Plan - 12/17/14 1246    Clinical Impression Statement Pt progressing with functional mid reach, grasp and release with min facilitation and mod vc's for strategies. Pt benefits from signficant repetition due to cogntive deficits, apraxia,  sensory loss and fear of pain.   Pt will benefit from skilled  therapeutic intervention in order to improve on the following deficits (Retired) Decreased cognition;Decreased knowledge of use of DME;Impaired flexibility;Pain;Decreased coordination;Decreased mobility;Impaired sensation;Improper body mechanics;Decreased activity tolerance;Decreased endurance;Decreased range of motion;Decreased strength;Impaired tone;Decreased balance;Decreased knowledge of precautions;Decreased safety awareness;Impaired UE functional use;Impaired perceived functional ability   Rehab Potential Good   Clinical Impairments Affecting Rehab Potential pain RUE, apraxia, impaired sensation, impaired cogntion   OT Frequency 2x / week   OT Duration 8 weeks   OT Treatment/Interventions Vigorito-care/ADL training;Electrical Stimulation;Therapeutic exercise;Cognitive remediation/compensation;Moist Heat;Parrafin;Neuromuscular education;Splinting;Visual/perceptual remediation/compensation;Fluidtherapy;Energy conservation;Therapist, nutritional;Therapeutic exercises;Patient/family education;DME and/or AE instruction;Manual Therapy;Passive range of motion;Therapeutic activities   Plan NMR, functional reach and improved hand function   Consulted and Agree with Plan of Care Patient;Family member/caregiver   Family Member Consulted HUSBAND        Problem List Patient Active Problem List   Diagnosis Date Noted  . Monilial vaginitis 10/23/2014  . Deep vein thrombosis 08/12/2014  . History of inferior vena caval filter placement 07/14/2014  . Malignant glioma of brain 06/28/2014  . Diabetes mellitus, type 2 06/27/2014  . 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 Tremor of both hands 04/05/2014  . Vertigo 04/05/2014  . BPPV (benign paroxysmal positional vertigo) 03/14/2014  . Seizures 12/16/2013  . Epigastric pain 11/01/2013  . Fever blister 10/16/2013  . Unspecified hypothyroidism 12/13/2012  . Other and unspecified  hyperlipidemia 12/13/2012  . Anxiety state, unspecified 12/13/2012  . Edema 12/13/2012  . Anxiety state 12/13/2012  . Adult hypothyroidism 12/13/2012  . Type II or unspecified type diabetes mellitus without mention of complication, not stated as uncontrolled   . Urinary incontinence   . DEPRESSION 09/24/2007  . Essential hypertension 09/24/2007  . GERD 09/24/2007  . HIATAL HERNIA 09/24/2007  . 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 Headache(784.0) 09/24/2007    Quay Burow OTR/L 12/17/2014, 12:50 PM  [facility] [address] [place], [place], [address] Phone: [phone]   Fax:  [phone]

## 2014-12-20 ENCOUNTER — Encounter: Payer: Self-pay | Admitting: Occupational Therapy

## 2014-12-20 ENCOUNTER — Ambulatory Visit: Payer: Medicare Other | Admitting: Occupational Therapy

## 2014-12-20 DIAGNOSIS — R269 Unspecified abnormalities of gait and mobility: Secondary | ICD-10-CM | POA: Diagnosis not present

## 2014-12-20 DIAGNOSIS — M25511 Pain in right shoulder: Secondary | ICD-10-CM

## 2014-12-20 DIAGNOSIS — G8191 Hemiplegia, unspecified affecting right dominant side: Secondary | ICD-10-CM

## 2014-12-20 DIAGNOSIS — R29898 Other symptoms and signs involving the musculoskeletal system: Secondary | ICD-10-CM

## 2014-12-20 DIAGNOSIS — R279 Unspecified lack of coordination: Secondary | ICD-10-CM

## 2014-12-20 NOTE — Therapy (Signed)
[facility] [address] [place], [place], [address] Phone: [phone]   Fax:  [phone]  Occupational Therapy Treatment  Patient Details  Name: Kelly Fletcher MRN: [id number] Date of Birth: 27-Jan-1951 Referring Provider:  Estill Dooms, MD  Encounter Date: 12/20/2014      OT End of Session - 12/20/14 1552    Visit Number 18   Number of Visits 30   Date for OT Re-Evaluation 02/09/15   Authorization Type MCR - G code needed   OT Start Time 1400   OT Stop Time 1445   OT Time Calculation (min) 45 min   Activity Tolerance Patient tolerated treatment well      Past Medical History  Diagnosis Date  . Uterine prolapse   . Cystocele   . GERD (gastroesophageal reflux disease)   . Elevated cholesterol   . Rectocele   . Hypertension   . Arthritis   . Type II or unspecified type diabetes mellitus without mention of complication, uncontrolled     Type 2  . Urinary incontinence     Urgency  . Anxiety state, unspecified   . Palpitations   . Allergic rhinitis, cause unspecified   . Other and unspecified hyperlipidemia   . Unspecified hypothyroidism   . Edema   . Insomnia, unspecified   . Depressive disorder, not elsewhere classified   . Unspecified urinary incontinence   . Peptic ulcer, unspecified site, unspecified as acute or chronic, without mention of hemorrhage, perforation, or obstruction   . Disturbance of skin sensation   . Calculus of kidney   . Facial spasm 04/05/2014  . Abnormal brain MRI 04/13/2014  . 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 Monilial vaginitis 10/23/2014    Past Surgical History  Procedure Laterality Date  . Tonsillectomy and adenoidectomy  1963  . Dilation and curettage of uterus      3-05  . Hysteroscopy      3-05  . Endometrial biopsy      Dr.Gottsegen   . Tongue biopsy      There were no vitals filed  for this visit.  Visit Diagnosis:  Hemiplegia affecting right dominant side  Pain in joint, shoulder region, right  Weakness of right arm  Weakness of right hand  Lack of coordination      Subjective Assessment - 12/20/14 1406    Subjective  I decided to stop speech and focus on my arm for now   Patient is accompained by: Family member  husband   Pertinent History glioblastoma with brain surgery to remove tumor 06/28/14, facial seizures   Patient Stated Goals To get my Rt shoulder and hand better b/c I'm Rt handed   Currently in Pain? No/denies                      OT Treatments/Exercises (OP) - 12/20/14 0001    Neurological Re-education Exercises   Other Exercises 1 Neuro re ed to address mid to high reach first in sitting with ball - pt initially required min facilitation with 0-90* of flexion then able to eventually achieve approximately 120* with min facilitation and 0-90* with no facilitiaton.  Used UE ranger in standing  to work on mid to high level reach. Intiitally required mod facilitation, mod vc's and mod tactile cues for aligment and more normal movement patterns; with facilitation and repetition pt  eventually able to achieve 115* shoulder flexion with UE ranger in standing with vc's only, encouragement and no pain.  Treatment then focused on incorporating mid reach with hand function in functional task - reach, grasp, place and release using full composite grip and working toward smaller objects and using 3 pt pinch.  Pt requires mod vc's and encouragement and reminders to use vision to compensate for sensory loss and apraxia.                  OT Short Term Goals - 12/20/14 1548    OT SHORT TERM GOAL #2   Title Pt to verbalize understanding with pain management strategies RUE (ongoing for renewal period - due 01/10/15)   Status On-going   OT SHORT TERM GOAL #3   Title Pt to write name at 75% or greater legibility w/ A/E for pen prn Rt dominant  hand (due 01/10/15)   Baseline approx. 25% first name only with built up pen   Status On-going   OT SHORT TERM GOAL #4   Title Improve RUE functional use as evidenced by performing 20 blocks or greater on Box & Blocks test   Baseline eval = 17, 12/04/14 = 12   Status On-going   OT SHORT TERM GOAL #5   Title Pt to improve grip strength Rt hand to 15 lbs or greater to decrease drops   Baseline eval = 10 lbs (Lt = 55 lbs); 12/04/14 = 10 lbs   Status On-going           OT Long Term Goals - 12/20/14 1550    OT LONG TERM GOAL #1   Title Independent with updated HEP for RUE strength (DUE 02/09/15)   Status On-going   OT LONG TERM GOAL #2   Title Pt to perform high level reaching RUE to retrieve/replace light weight object from high shelf 5 times w/o rest and min compensations   Baseline only 75% shoulder flexion   Status Revised   OT LONG TERM GOAL #3   Title Improve RUE functional use as evidenced by performing 30 blocks on Box & Blocks test    Status On-going   OT LONG TERM GOAL #4   Title Improve coordination as evidenced by performing 9 hole peg test in under  2 min.    Baseline eval: only able to place 1 peg in 60 sec.    Status On-going   OT LONG TERM GOAL #5   Title Pt to improve grip strength Rt dominant hand to 20 lbs or greater to open jars/containers   Baseline eval = 10 lbs, 12/04/14 = 10 lbs   Status On-going   OT LONG TERM GOAL #6   Title Pt to return to using Rt hand as dominant hand for all BADLS 32% of the time or greater   Baseline only using LUE   Status On-going   OT LONG TERM GOAL #7   Title Pt to write sentence with Rt dominant hand using A/E prn with 75% legibility    Baseline 25% legibility for first name only   Status On-going   OT LONG TERM GOAL #8   Title Pt to return to light cooking/cleaning tasks with min assist with A/E and/or DME prn   Baseline dependent   Status On-going               Plan - 12/20/14 1550    Clinical Impression Statement Pt  with significant improvement in mid to high level  pain free reach as well as functional use of the hand with low to mid reach.  Pt states she is extremely pleased with progress.   Pt will benefit from skilled therapeutic intervention in order to improve on the following deficits (Retired) Decreased cognition;Decreased knowledge of use of DME;Impaired flexibility;Pain;Decreased coordination;Decreased mobility;Impaired sensation;Improper body mechanics;Decreased activity tolerance;Decreased endurance;Decreased range of motion;Decreased strength;Impaired tone;Decreased balance;Decreased knowledge of precautions;Decreased safety awareness;Impaired UE functional use;Impaired perceived functional ability   Clinical Impairments Affecting Rehab Potential pain RUE, apraxia, impaired sensation, impaired cogntion   OT Frequency 2x / week   OT Duration 8 weeks   OT Treatment/Interventions Schriver-care/ADL training;Electrical Stimulation;Therapeutic exercise;Cognitive remediation/compensation;Moist Heat;Parrafin;Neuromuscular education;Splinting;Visual/perceptual remediation/compensation;Fluidtherapy;Energy conservation;Therapist, nutritional;Therapeutic exercises;Patient/family education;DME and/or AE instruction;Manual Therapy;Passive range of motion;Therapeutic activities   Plan NMR, functional reach, improved hand function, use of vision to compensate   Consulted and Agree with Plan of Care Patient;Family member/caregiver   Family Member Consulted HUSBAND        Problem List Patient Active Problem List   Diagnosis Date Noted  . Monilial vaginitis 10/23/2014  . Deep vein thrombosis 08/12/2014  . History of inferior vena caval filter placement 07/14/2014  . Malignant glioma of brain 06/28/2014  . Diabetes mellitus, type 2 06/27/2014  . 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 Tremor of both hands 04/05/2014  . Vertigo 04/05/2014  . BPPV (benign paroxysmal positional vertigo) 03/14/2014  .  Seizures 12/16/2013  . Epigastric pain 11/01/2013  . Fever blister 10/16/2013  . Unspecified hypothyroidism 12/13/2012  . Other and unspecified hyperlipidemia 12/13/2012  . Anxiety state, unspecified 12/13/2012  . Edema 12/13/2012  . Anxiety state 12/13/2012  . Adult hypothyroidism 12/13/2012  . Type II or unspecified type diabetes mellitus without mention of complication, not stated as uncontrolled   . Urinary incontinence   . DEPRESSION 09/24/2007  . Essential hypertension 09/24/2007  . GERD 09/24/2007  . HIATAL HERNIA 09/24/2007  . 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 Headache(784.0) 09/24/2007    Quay Burow, OTR/L 12/20/2014, 3:55 PM  [facility] [address] [place], [place], [address] Phone: [phone]   Fax:  [phone]

## 2014-12-25 ENCOUNTER — Ambulatory Visit: Payer: Medicare Other | Admitting: Occupational Therapy

## 2014-12-25 ENCOUNTER — Ambulatory Visit (INDEPENDENT_AMBULATORY_CARE_PROVIDER_SITE_OTHER): Payer: Medicare Other | Admitting: Internal Medicine

## 2014-12-25 ENCOUNTER — Encounter: Payer: Self-pay | Admitting: Physical Therapy

## 2014-12-25 ENCOUNTER — Encounter: Payer: Self-pay | Admitting: Internal Medicine

## 2014-12-25 ENCOUNTER — Ambulatory Visit: Payer: Medicare Other | Admitting: Physical Therapy

## 2014-12-25 VITALS — BP 110/70 | HR 68 | Temp 98.7°F | Resp 18 | Ht 69.0 in | Wt 163.8 lb

## 2014-12-25 DIAGNOSIS — E039 Hypothyroidism, unspecified: Secondary | ICD-10-CM | POA: Diagnosis not present

## 2014-12-25 DIAGNOSIS — R569 Unspecified convulsions: Secondary | ICD-10-CM

## 2014-12-25 DIAGNOSIS — R1013 Epigastric pain: Secondary | ICD-10-CM | POA: Diagnosis not present

## 2014-12-25 DIAGNOSIS — R269 Unspecified abnormalities of gait and mobility: Secondary | ICD-10-CM | POA: Diagnosis not present

## 2014-12-25 DIAGNOSIS — R279 Unspecified lack of coordination: Secondary | ICD-10-CM

## 2014-12-25 DIAGNOSIS — E119 Type 2 diabetes mellitus without complications: Secondary | ICD-10-CM | POA: Diagnosis not present

## 2014-12-25 DIAGNOSIS — R6889 Other general symptoms and signs: Secondary | ICD-10-CM

## 2014-12-25 DIAGNOSIS — R609 Edema, unspecified: Secondary | ICD-10-CM

## 2014-12-25 DIAGNOSIS — I10 Essential (primary) hypertension: Secondary | ICD-10-CM

## 2014-12-25 DIAGNOSIS — G8191 Hemiplegia, unspecified affecting right dominant side: Secondary | ICD-10-CM

## 2014-12-25 DIAGNOSIS — C719 Malignant neoplasm of brain, unspecified: Secondary | ICD-10-CM | POA: Diagnosis not present

## 2014-12-25 DIAGNOSIS — R2689 Other abnormalities of gait and mobility: Secondary | ICD-10-CM

## 2014-12-25 DIAGNOSIS — R29898 Other symptoms and signs involving the musculoskeletal system: Secondary | ICD-10-CM

## 2014-12-25 MED ORDER — PANTOPRAZOLE SODIUM 40 MG PO TBEC: DELAYED_RELEASE_TABLET | ORAL | Status: DC

## 2014-12-25 NOTE — Therapy (Signed)
[facility] [address] [place], [place], [address] Phone: [phone]   Fax:  [phone]  Physical Therapy Treatment  Patient Details  Name: Kelly Fletcher MRN: [id number] Date of Birth: Oct 19, 1950 Referring Provider:  Estill Dooms, MD  Encounter Date: 12/25/2014      PT End of Session - 12/25/14 1100    Visit Number 16   Number of Visits 22   Date for PT Re-Evaluation 02/05/15   Authorization Type G-code & KX modifier   PT Start Time 1100   PT Stop Time 1145   PT Time Calculation (min) 45 min   Equipment Utilized During Treatment Gait belt   Activity Tolerance Patient tolerated treatment well   Behavior During Therapy [facility] for tasks assessed/performed      Past Medical History  Diagnosis Date  . Uterine prolapse   . Cystocele   . GERD (gastroesophageal reflux disease)   . Elevated cholesterol   . Rectocele   . Hypertension   . Arthritis   . Type II or unspecified type diabetes mellitus without mention of complication, uncontrolled     Type 2  . Urinary incontinence     Urgency  . Anxiety state, unspecified   . Palpitations   . Allergic rhinitis, cause unspecified   . Other and unspecified hyperlipidemia   . Unspecified hypothyroidism   . Edema   . Insomnia, unspecified   . Depressive disorder, not elsewhere classified   . Unspecified urinary incontinence   . Peptic ulcer, unspecified site, unspecified as acute or chronic, without mention of hemorrhage, perforation, or obstruction   . Disturbance of skin sensation   . Calculus of kidney   . Facial spasm 04/05/2014  . Abnormal brain MRI 04/13/2014  . 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 Monilial vaginitis 10/23/2014    Past Surgical History  Procedure Laterality Date  . Tonsillectomy and adenoidectomy  1963  . Dilation and curettage of uterus      3-05  . Hysteroscopy       3-05  . Endometrial biopsy      Dr.Gottsegen   . Tongue biopsy      There were no vitals filed for this visit.  Visit Diagnosis:  Abnormality of gait  Balance problems  Decreased functional activity tolerance  Right leg weakness      Subjective Assessment - 12/25/14 1104    Subjective Has not used ankle brace since Friday without issues.   Currently in Pain? No/denies     Couey-care: Patient education on swelling and elevation. See pt ed. Gait Training: No device, no ankle brace, all with SBA with verbal cues on balance & posture - Multi-tasking Patient ambulated >1000' performing cognitive tasks of A-Z subject listing, scanning environment to answer questions and balance activities. Patient negotiated ramps & curbs without device with SBA with cues to not hesitate or change lead limb. Sidestepping, backwards gait, marching on floor first then progressed to compliant surface flat then bean bag under mat to create uneven surface. Single leg stance on compliant surface of floor mat LLE 5-8sec, RLE 3-5 sec 5 reps per leg Treadmill Gait trainer program 2.0 mph, 6 min, Step cycle 0.78 cycles/sec, step length left 62cm, right 60cm, Variation left 8%, right 10%, time on each foot left 52%, right 48/%. PT instructed pt and husband in use of treadmill with recommendation 2.36mh for 10  minutes and progressing speed and time but only one at time once safe and comfortable for >3 sessions.  Both verbalized understanding.                            PT Education - 12/25/14 1100    Education provided Yes   Education Details Use of treadmill, elevating LEs for swelling with ankle A-Z exercise while elevated   Person(s) Educated Patient;Spouse   Methods Explanation;Demonstration   Comprehension Verbalized understanding          PT Short Term Goals - 12/06/14 1100    PT SHORT TERM GOAL #1   Title patient correcty demonstrates initial HEP with husband cueing.  (Target Date: 11/09/2014)   Time 4   Period Weeks   Status Achieved   PT SHORT TERM GOAL #2   Title Timed Up & Go <13.5 sec without device (Target Date: 11/09/2014)  Current time is 8.13 seconds (11/08/14)   Time 4   Period Weeks   Status Achieved   PT SHORT TERM GOAL #3   Title Berg Balance >46/56 (Target Date: 11/09/2014)  Current [name] is 54/56 (11/08/14)   Time 4   Period Weeks   Status Achieved   PT SHORT TERM GOAL #4   Title ambulates 500' without device including grass surfaces, ramps & curbs with supervision. (Target Date: 11/09/2014)   Time 4   Period Weeks   Status Achieved   PT SHORT TERM GOAL #5   Title ambulates 200' without device on grass with supervision while carrying on conversation. (Target Date: 01/04/2015)   Time 4   Period Weeks   Status New   Additional Short Term Goals   Additional Short Term Goals Yes   PT SHORT TERM GOAL #6   Title Functional Gait Assessment >/= 25/30 (Target Date: 01/04/2015)   Time 4   Period Weeks   Status New           PT Long Term Goals - 12/06/14 1100    PT LONG TERM GOAL #1   Title demonstrates / verbalizes understanding of ongoing HEP / fitness plan. (UPDATED Target Date: 02/01/2015)   Baseline Met: 12/06/14 but with continued PT HEP / fitness plan will need to be progressed further.    Time 8   Period Weeks   Status On-going   PT LONG TERM GOAL #2   Title Cognitive Timed Up & Go <15 sec safely  (Target Date: 12/07/2014)   Baseline Met: 12/06/14 14.77 sec   Time 8   Period Weeks   Status Achieved   PT LONG TERM GOAL #3   Title Berg Balance >/=52/56  (UPDATED Target Date: 02/01/2015)   Baseline NOT MET 12/06/2014 [name] Balance 44/56    Time 8   Period Weeks   Status On-going   PT LONG TERM GOAL #4   Title Functional Gait Assessment >25/ 30  (UPDATED Target Date: 02/01/2015)   Baseline Partially MET 12/06/2014 FGA 23/30   Status On-going   PT LONG TERM GOAL #5   Title ambulates >1000' without device including grass, ramps, curbs,  stairs indpendently.  (Target Date: 12/07/2014)   Baseline partially met 12/06/14; pt able to ambulate >1000' without device independently on level surfaces indoors and outdoors, but still requires supervision for safety on grass   Time 8   Period Weeks   Status Partially Met   Additional Long Term Goals   Additional Long Term Goals Yes  PT LONG TERM GOAL #6   Title Cognitive Timed Up & Go <15% increase from standard TUG (UPDATED Target Date: 02/01/2015)   Time 8   Period Weeks   Status New   PT LONG TERM GOAL #7   Title Patient ambulates 200' on grass without device while carrying on conversation modified independent. (UPDATED Target Date: 02/01/2015)   Time 8   Period Weeks   Status New               Plan - 12/25/14 1100    Clinical Impression Statement patient and husband seem to understand how to safely use Treadmill at their son's house. Patient had no issues with her ankle with compliant surfaces or multi-tasking. Paient was able to negotiate ramps & curbs without device without hesistancy or modifying technique.   Pt will benefit from skilled therapeutic intervention in order to improve on the following deficits Abnormal gait;Decreased activity tolerance;Decreased balance;Decreased endurance;Decreased strength   Rehab Potential Good   PT Frequency 1x / week   PT Duration 8 weeks   PT Treatment/Interventions ADLs/[facility];DME Instruction;Gait training;Stair training;Functional mobility training;Therapeutic activities;Therapeutic exercise;Balance training;Neuromuscular re-education;Patient/family education   PT Next Visit Plan Assess STGs, cont with strengthening and high level balance training   PT Home Exercise Plan hip abduction strengthening for bil. LE's   Consulted and Agree with Plan of Care Patient;Family member/caregiver   Family Member Consulted husband        Problem List Patient Active Problem List   Diagnosis Date Noted  . Monilial vaginitis  10/23/2014  . Deep vein thrombosis 08/12/2014  . History of inferior vena caval filter placement 07/14/2014  . Malignant glioma of brain 06/28/2014  . Diabetes mellitus, type 2 06/27/2014  . 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 Tremor of both hands 04/05/2014  . Vertigo 04/05/2014  . BPPV (benign paroxysmal positional vertigo) 03/14/2014  . Seizures 12/16/2013  . Epigastric pain 11/01/2013  . Fever blister 10/16/2013  . Unspecified hypothyroidism 12/13/2012  . Other and unspecified hyperlipidemia 12/13/2012  . Anxiety state, unspecified 12/13/2012  . Edema 12/13/2012  . Anxiety state 12/13/2012  . Adult hypothyroidism 12/13/2012  . Type II or unspecified type diabetes mellitus without mention of complication, not stated as uncontrolled   . Urinary incontinence   . DEPRESSION 09/24/2007  . Essential hypertension 09/24/2007  . GERD 09/24/2007  . HIATAL HERNIA 09/24/2007  . 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 BPPHKFEX(614.7) 09/24/2007    Terelle Dobler PT, DPT 12/25/2014, 12:56 PM  [facility] [address] [place], [place], [address] Phone: [phone]   Fax:  [phone]

## 2014-12-25 NOTE — Therapy (Signed)
[facility] [address] [place], [place], [address] Phone: [phone]   Fax:  [phone]  Occupational Therapy Treatment  Patient Details  Name: Kelly Fletcher MRN: [id number] Date of Birth: 09-Mar-1951 Referring Provider:  Estill Dooms, MD  Encounter Date: 12/25/2014      OT End of Session - 12/25/14 1242    Visit Number 19   Number of Visits 30   Date for OT Re-Evaluation 02/09/15   Authorization Type MCR - G code needed   Authorization Time Period 35 Days   Authorization - Visit Number 66   Authorization - Number of Visits 20   OT Start Time 1148   OT Stop Time 1233   OT Time Calculation (min) 45 min   Activity Tolerance Patient tolerated treatment well      Past Medical History  Diagnosis Date  . Uterine prolapse   . Cystocele   . GERD (gastroesophageal reflux disease)   . Elevated cholesterol   . Rectocele   . Hypertension   . Arthritis   . Type II or unspecified type diabetes mellitus without mention of complication, uncontrolled     Type 2  . Urinary incontinence     Urgency  . Anxiety state, unspecified   . Palpitations   . Allergic rhinitis, cause unspecified   . Other and unspecified hyperlipidemia   . Unspecified hypothyroidism   . Edema   . Insomnia, unspecified   . Depressive disorder, not elsewhere classified   . Unspecified urinary incontinence   . Peptic ulcer, unspecified site, unspecified as acute or chronic, without mention of hemorrhage, perforation, or obstruction   . Disturbance of skin sensation   . Calculus of kidney   . Facial spasm 04/05/2014  . Abnormal brain MRI 04/13/2014  . 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 Monilial vaginitis 10/23/2014    Past Surgical History  Procedure Laterality Date  . Tonsillectomy and adenoidectomy  1963  . Dilation and curettage of uterus      3-05  .  Hysteroscopy      3-05  . Endometrial biopsy      Dr.Gottsegen   . Tongue biopsy      There were no vitals filed for this visit.  Visit Diagnosis:  Hemiplegia affecting right dominant side  Lack of coordination      Subjective Assessment - 12/25/14 1152    Subjective  I still can't eat with my Rt hand or hold a pen yet, but some days my hand is better than others   Patient is accompained by: Family member  husband   Pertinent History glioblastoma with brain surgery to remove tumor 06/28/14, facial seizures   Patient Stated Goals To get my Rt shoulder and hand better b/c I'm Rt handed   Currently in Pain? No/denies                      OT Treatments/Exercises (OP) - 12/25/14 0001    Exercises   Exercises --  UBE x 85min. for reciprocal movement   Neurological Re-education Exercises   Other Exercises 1 AA/ROM using UE Ranger in standing to work on mid to high level flexion w/ min tactile cues/facilitation for neutral shoulder and forearm rotation (to prevent IR), elbow extension , and for control with descending. Also worked midrange shoulder abduction with UE Ranger   Functional Reaching  Activities   Mid Level to pick up blocks for pincer grasp and place in cups tabletop surface with min drops due to thumb IP flexion/decreased sensation                  OT Short Term Goals - 12/20/14 1548    OT SHORT TERM GOAL #2   Title Pt to verbalize understanding with pain management strategies RUE (ongoing for renewal period - due 01/10/15)   Status On-going   OT SHORT TERM GOAL #3   Title Pt to write name at 75% or greater legibility w/ A/E for pen prn Rt dominant hand (due 01/10/15)   Baseline approx. 25% first name only with built up pen   Status On-going   OT SHORT TERM GOAL #4   Title Improve RUE functional use as evidenced by performing 20 blocks or greater on Box & Blocks test   Baseline eval = 17, 12/04/14 = 12   Status On-going   OT SHORT TERM GOAL #5    Title Pt to improve grip strength Rt hand to 15 lbs or greater to decrease drops   Baseline eval = 10 lbs (Lt = 55 lbs); 12/04/14 = 10 lbs   Status On-going           OT Long Term Goals - 12/20/14 1550    OT LONG TERM GOAL #1   Title Independent with updated HEP for RUE strength (DUE 02/09/15)   Status On-going   OT LONG TERM GOAL #2   Title Pt to perform high level reaching RUE to retrieve/replace light weight object from high shelf 5 times w/o rest and min compensations   Baseline only 75% shoulder flexion   Status Revised   OT LONG TERM GOAL #3   Title Improve RUE functional use as evidenced by performing 30 blocks on Box & Blocks test    Status On-going   OT LONG TERM GOAL #4   Title Improve coordination as evidenced by performing 9 hole peg test in under  2 min.    Baseline eval: only able to place 1 peg in 60 sec.    Status On-going   OT LONG TERM GOAL #5   Title Pt to improve grip strength Rt dominant hand to 20 lbs or greater to open jars/containers   Baseline eval = 10 lbs, 12/04/14 = 10 lbs   Status On-going   OT LONG TERM GOAL #6   Title Pt to return to using Rt hand as dominant hand for all BADLS 06% of the time or greater   Baseline only using LUE   Status On-going   OT LONG TERM GOAL #7   Title Pt to write sentence with Rt dominant hand using A/E prn with 75% legibility    Baseline 25% legibility for first name only   Status On-going   OT LONG TERM GOAL #8   Title Pt to return to light cooking/cleaning tasks with min assist with A/E and/or DME prn   Baseline dependent   Status On-going               Plan - 12/25/14 1243    Clinical Impression Statement Pt progressing with UE ROM and less drops with picking up blocks for functional grasping/pinching. Pt limited with writing and eating skills RUE.    Plan NMR, functional reaching, UBE, G-CODE and 10th progress note due next visit!!   Consulted and Agree with Plan of Care Patient;Family member/caregiver    Family Member  Consulted HUSBAND        Problem List Patient Active Problem List   Diagnosis Date Noted  . Monilial vaginitis 10/23/2014  . Deep vein thrombosis 08/12/2014  . History of inferior vena caval filter placement 07/14/2014  . Malignant glioma of brain 06/28/2014  . Diabetes mellitus, type 2 06/27/2014  . 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 Tremor of both hands 04/05/2014  . Vertigo 04/05/2014  . BPPV (benign paroxysmal positional vertigo) 03/14/2014  . Seizures 12/16/2013  . Epigastric pain 11/01/2013  . Fever blister 10/16/2013  . Unspecified hypothyroidism 12/13/2012  . Other and unspecified hyperlipidemia 12/13/2012  . Anxiety state, unspecified 12/13/2012  . Edema 12/13/2012  . Anxiety state 12/13/2012  . Adult hypothyroidism 12/13/2012  . Type II or unspecified type diabetes mellitus without mention of complication, not stated as uncontrolled   . Urinary incontinence   . DEPRESSION 09/24/2007  . Essential hypertension 09/24/2007  . GERD 09/24/2007  . HIATAL HERNIA 09/24/2007  . 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 Headache(784.0) 09/24/2007    Carey Bullocks, OTR/L 12/25/2014, 12:50 PM  [facility] [address] [place], [place], [address] Phone: [phone]   Fax:  [phone]

## 2014-12-25 NOTE — Progress Notes (Signed)
Patient ID: Kelly Fletcher, female   DOB: 01-Sep-1950, 64 y.o.   MRN: [id number]    Facility  [facility]    Place of Service:   OFFICE    Allergies  Allergen Reactions  . Shellfish Allergy Anaphylaxis  . Ibuprofen   . Latex     Skin rash  . Naproxen   . Nitrofurantoin Monohyd Macro   . Paroxetine Hcl   . Prednisone   . Prozac [Fluoxetine Hcl]   . Tegretol [Carbamazepine] Hives    Chief Complaint  Patient presents with  . Medical Management of Chronic Issues    questions  about meds,    HPI:  Malignant glioma of brain - continues on chemotherapy for several more months at [facility].  Type 2 diabetes mellitus without complication: Has not had A1c checked recently. She is now off prednisone. Continues on metformin.  Hypothyroidism, unspecified hypothyroidism type: No recent lab work  Essential hypertension: Controlled  Seizures: Last seizure was February 2016  Epigastric pain - asymptomatic since being put on pantoprazole (PROTONIX) 40 MG tablet  Edema: Bilateral leg edema, worse on the right    Medications: Patient's Medications  New Prescriptions   No medications on file  Previous Medications   ATORVASTATIN (LIPITOR) 10 MG TABLET    ONE DAILY TO CONTROL CHOLESTEROL   B COMPLEX VITAMINS TABLET    Take 1 tablet by mouth daily.   CHOLECALCIFEROL (VITAMIN D) 1000 UNITS TABLET    Take 1,000 Units by mouth daily. Takes 2000-5060m daily   CLOBAZAM (ONFI) 10 MG TABLET    Take 10 mg by mouth at bedtime.   CLONAZEPAM (KLONOPIN) 1 MG TABLET    Take 1 mg by mouth as needed. Take at onset of seizure   DOCUSATE SODIUM (COLACE) 100 MG CAPSULE    Take 100 mg by mouth 2 (two) times daily.   ESCITALOPRAM (LEXAPRO) 10 MG TABLET    Take 10 mg by mouth daily.   FLUCONAZOLE (DIFLUCAN) 150 MG TABLET    One daily to treat yeast   LACOSAMIDE (VIMPAT) 200 MG TABS TABLET    Take 200 mg by mouth 2 (two) times daily.   LEVETIRACETAM (KEPPRA) 750 MG TABLET    Take 1,500 mg by mouth 2 (two) times daily.    LEVOTHYROXINE (SYNTHROID, LEVOTHROID) 125 MCG TABLET    TAKE 1 TABLET (125 MCG TOTAL) BY MOUTH DAILY BEFORE BREAKFAST.   LORATADINE (CLARITIN) 10 MG TABLET    Take 10 mg by mouth daily as needed for allergies.   METFORMIN (GLUCOPHAGE-XR) 500 MG 24 HR TABLET    TAKE 1 TABLET TWICE DAILY TO CONTROL BLOOD SUGAR   MICONAZOLE NITRATE VAGINAL 4 % CREA    Apply one applicator nightly for 3 nights   ONDANSETRON (ZOFRAN) 8 MG TABLET    Take 8 mg by mouth every 8 (eight) hours as needed.   ONE TOUCH ULTRA TEST TEST STRIP    CHECK BLOOD GLUCOSE AS DIRECTED   PANTOPRAZOLE (PROTONIX) 40 MG TABLET    Take 40 mg by mouth daily.   SENNOSIDES-DOCUSATE SODIUM (SENOKOT-S) 8.6-50 MG TABLET    Take 1 tablet by mouth daily.   TEMOZOLOMIDE (TEMODAR) 100 MG CAPSULE    Take 1 capsule (100 mg total) by mouth daily. Total dose 380 mg daily for 5 days. May take on an empty stomach or at bedtime.   TEMOZOLOMIDE (TEMODAR) 140 MG CAPSULE    Take 2 capsules (280 mg total) by mouth daily. Total dose= 380 mg  for 5 days. May take on an empty stomach or at bedtime.   VALACYCLOVIR (VALTREX) 1000 MG TABLET    Take 1,000 mg by mouth 2 (two) times daily as needed. To help resolve viral blisters  Modified Medications   No medications on file  Discontinued Medications   No medications on file     Review of Systems  Constitutional: Positive for fatigue. Negative for fever, chills, activity change and appetite change.  HENT: Positive for tinnitus. Negative for ear discharge and ear pain.   Eyes: Negative.  Negative for visual disturbance.  Respiratory: Negative.   Cardiovascular: Negative.   Gastrointestinal: Negative.        Epigastric discomfort  Endocrine: Negative.   Genitourinary: Positive for frequency.  Musculoskeletal: Negative.   Skin: Negative.   Allergic/Immunologic: Negative.   Neurological: Positive for dizziness and seizures (Last seizure was about February 2016.).       Glioblastoma frontal lobes. Status post  resection. Has had chemotherapy. Continues to have mild tremor of the right hand, dysarthria, and weakness of the right side more prominent in the upper extremity.  Hematological: Negative.   Psychiatric/Behavioral:       Chronic stress    Filed Vitals:   12/25/14 1332  BP: 110/70  Pulse: 68  Temp: 98.7 F (37.1 C)  TempSrc: Oral  Resp: 18  Height: 5' 9"  (1.753 m)  Weight: 163 lb 12.8 oz (74.299 kg)  SpO2: 97%   Body mass index is 24.18 kg/(m^2).  Physical Exam  Constitutional: She is oriented to person, place, and time. She appears well-developed and well-nourished. No distress.  HENT:  Nose: Nose normal.  Mouth/Throat: No oropharyngeal exudate.  Eyes: Conjunctivae and EOM are normal. Pupils are equal, round, and reactive to light. Left eye exhibits no discharge. Right eye exhibits no nystagmus. Left eye exhibits no nystagmus.  Neck: No JVD present. No tracheal deviation present. No thyromegaly present.  Cardiovascular: Normal rate, regular rhythm, normal heart sounds and intact distal pulses.  Exam reveals no gallop and no friction rub.   No murmur heard. Pulses:      Dorsalis pedis pulses are 2+ on the right side, and 2+ on the left side.  Pulmonary/Chest: Effort normal and breath sounds normal. No respiratory distress. She has no wheezes. She has no rales.  Abdominal: She exhibits no distension and no mass. There is no tenderness.  Musculoskeletal: She exhibits edema and tenderness.       Right knee: She exhibits decreased range of motion, swelling and erythema. She exhibits no deformity. Tenderness found.       Feet:  Left Lateral posterior and anterior malleolus swelling and TTP with reduced ankle ROM. No calf TTPno achilles tendon TTP. Strength reduced with dorsiflexion R>L. Gait antalgic.  Lymphadenopathy:    She has no cervical adenopathy.  Neurological: She is alert and oriented to person, place, and time. She has normal reflexes. No cranial nerve deficit.  Coordination normal.  Dysarthria. Right hemiparesis. Right arm tremor.  Skin: Skin is warm and dry. No rash noted. No erythema. No pallor.  3 benign appearing moles on labia. No areas suggest melanoma. Large scar left parietal area secondary to previous brain surgery.  Psychiatric: Her behavior is normal. Judgment and thought content normal. Her mood appears anxious. Her speech is slurred.     Labs reviewed: Appointment on 12/11/2014  Component Date Value Ref Range Status  . WBC 12/11/2014 4.2  3.9 - 10.3 10e3/uL Final  . NEUT# 12/11/2014 3.2  1.5 - 6.5 10e3/uL Final  . HGB 12/11/2014 12.7  11.6 - 15.9 g/dL Final  . HCT 12/11/2014 38.1  34.8 - 46.6 % Final  . Platelets 12/11/2014 167  145 - 400 10e3/uL Final  . MCV 12/11/2014 95.2  79.5 - 101.0 fL Final  . MCH 12/11/2014 31.6  25.1 - 34.0 pg Final  . MCHC 12/11/2014 33.2  31.5 - 36.0 g/dL Final  . RBC 12/11/2014 4.00  3.70 - 5.45 10e6/uL Final  . RDW 12/11/2014 16.5* 11.2 - 14.5 % Final  . lymph# 12/11/2014 0.6* 0.9 - 3.3 10e3/uL Final  . MONO# 12/11/2014 0.3  0.1 - 0.9 10e3/uL Final  . Eosinophils Absolute 12/11/2014 0.1  0.0 - 0.5 10e3/uL Final  . Basophils Absolute 12/11/2014 0.0  0.0 - 0.1 10e3/uL Final  . NEUT% 12/11/2014 76.0  38.4 - 76.8 % Final  . LYMPH% 12/11/2014 13.1* 14.0 - 49.7 % Final  . MONO% 12/11/2014 7.7  0.0 - 14.0 % Final  . EOS% 12/11/2014 2.3  0.0 - 7.0 % Final  . BASO% 12/11/2014 0.9  0.0 - 2.0 % Final  Appointment on 12/04/2014  Component Date Value Ref Range Status  . WBC 12/04/2014 5.1  3.9 - 10.3 10e3/uL Final  . NEUT# 12/04/2014 4.1  1.5 - 6.5 10e3/uL Final  . HGB 12/04/2014 12.3  11.6 - 15.9 g/dL Final  . HCT 12/04/2014 36.7  34.8 - 46.6 % Final  . Platelets 12/04/2014 81* 145 - 400 10e3/uL Final  . MCV 12/04/2014 92.2  79.5 - 101.0 fL Final  . MCH 12/04/2014 30.9  25.1 - 34.0 pg Final  . MCHC 12/04/2014 33.5  31.5 - 36.0 g/dL Final  . RBC 12/04/2014 3.98  3.70 - 5.45 10e6/uL Final  . RDW  12/04/2014 13.9  11.2 - 14.5 % Final  . lymph# 12/04/2014 0.6* 0.9 - 3.3 10e3/uL Final  . MONO# 12/04/2014 0.3  0.1 - 0.9 10e3/uL Final  . Eosinophils Absolute 12/04/2014 0.1  0.0 - 0.5 10e3/uL Final  . Basophils Absolute 12/04/2014 0.0  0.0 - 0.1 10e3/uL Final  . NEUT% 12/04/2014 79.5* 38.4 - 76.8 % Final  . LYMPH% 12/04/2014 12.5* 14.0 - 49.7 % Final  . MONO% 12/04/2014 5.7  0.0 - 14.0 % Final  . EOS% 12/04/2014 2.1  0.0 - 7.0 % Final  . BASO% 12/04/2014 0.2  0.0 - 2.0 % Final  . nRBC 12/04/2014 0  0 - 0 % Final  Appointment on 11/19/2014  Component Date Value Ref Range Status  . WBC 11/19/2014 5.8  3.9 - 10.3 10e3/uL Final  . NEUT# 11/19/2014 4.6  1.5 - 6.5 10e3/uL Final  . HGB 11/19/2014 13.7  11.6 - 15.9 g/dL Final  . HCT 11/19/2014 40.4  34.8 - 46.6 % Final  . Platelets 11/19/2014 161  145 - 400 10e3/uL Final  . MCV 11/19/2014 91.8  79.5 - 101.0 fL Final  . MCH 11/19/2014 31.1  25.1 - 34.0 pg Final  . MCHC 11/19/2014 33.9  31.5 - 36.0 g/dL Final  . RBC 11/19/2014 4.40  3.70 - 5.45 10e6/uL Final  . RDW 11/19/2014 13.3  11.2 - 14.5 % Final  . lymph# 11/19/2014 0.5* 0.9 - 3.3 10e3/uL Final  . MONO# 11/19/2014 0.5  0.1 - 0.9 10e3/uL Final  . Eosinophils Absolute 11/19/2014 0.1  0.0 - 0.5 10e3/uL Final  . Basophils Absolute 11/19/2014 0.0  0.0 - 0.1 10e3/uL Final  . NEUT% 11/19/2014 79.8* 38.4 - 76.8 % Final  . LYMPH%  11/19/2014 9.0* 14.0 - 49.7 % Final  . MONO% 11/19/2014 9.3  0.0 - 14.0 % Final  . EOS% 11/19/2014 1.7  0.0 - 7.0 % Final  . BASO% 11/19/2014 0.2  0.0 - 2.0 % Final  . Sodium 11/19/2014 141  136 - 145 mEq/L Final  . Potassium 11/19/2014 4.0  3.5 - 5.1 mEq/L Final  . Chloride 11/19/2014 105  98 - 109 mEq/L Final  . CO2 11/19/2014 27  22 - 29 mEq/L Final  . Glucose 11/19/2014 117  70 - 140 mg/dl Final  . BUN 11/19/2014 15.7  7.0 - 26.0 mg/dL Final  . Creatinine 11/19/2014 0.7  0.6 - 1.1 mg/dL Final  . Total Bilirubin 11/19/2014 0.60  0.20 - 1.20 mg/dL Final  .  Alkaline Phosphatase 11/19/2014 52  40 - 150 U/L Final  . AST 11/19/2014 14  5 - 34 U/L Final  . ALT 11/19/2014 21  0 - 55 U/L Final  . Total Protein 11/19/2014 6.0* 6.4 - 8.3 g/dL Final  . Albumin 11/19/2014 4.0  3.5 - 5.0 g/dL Final  . Calcium 11/19/2014 9.3  8.4 - 10.4 mg/dL Final  . Anion Gap 11/19/2014 8  3 - 11 mEq/L Final  . EGFR 11/19/2014 87* >90 ml/min/1.73 m2 Final   eGFR is calculated using the CKD-EPI Creatinine Equation (2009)  Office Visit on 10/23/2014  Component Date Value Ref Range Status  . Specific Gravity, UA 10/23/2014 1.016  1.005 - 1.030 Final  . pH, UA 10/23/2014 5.5  5.0 - 7.5 Final  . Color, UA 10/23/2014 Yellow  Yellow Final  . Appearance Ur 10/23/2014 Cloudy* Clear Final  . Leukocytes, UA 10/23/2014 1+* Negative Final  . Protein, UA 10/23/2014 Negative  Negative/Trace Final  . Glucose, UA 10/23/2014 Negative  Negative Final  . Ketones, UA 10/23/2014 Negative  Negative Final  . RBC, UA 10/23/2014 Negative  Negative Final  . Bilirubin, UA 10/23/2014 Negative  Negative Final  . Urobilinogen, Ur 10/23/2014 0.2  0.2 - 1.0 mg/dL Final  . Nitrite, UA 10/23/2014 Negative  Negative Final  . Urine Culture, Routine 10/23/2014 Final report   Final  . Result 1 10/23/2014 Comment   Final   Comment: Mixed urogenital flora 10,000-25,000 colony forming units per mL   . Color, UA 10/23/2014 yellow   In process  . Clarity, UA 10/23/2014 clear   In process  . Glucose, UA 10/23/2014 neg   In process  . Bilirubin, UA 10/23/2014 neg   In process  . Ketones, UA 10/23/2014 neg   In process  . Spec Grav, UA 10/23/2014 >=1.030   In process  . Blood, UA 10/23/2014 neg   In process  . pH, UA 10/23/2014 6.5   In process  . Protein, UA 10/23/2014 neg   In process  . Urobilinogen, UA 10/23/2014 0.2   In process  . Nitrite, UA 10/23/2014 neg   In process  . Leukocytes, UA 10/23/2014 Negative   In process     Assessment/Plan  1. Malignant glioma of brain Continue under the  care of [facility] physicians  2. Type 2 diabetes mellitus without complication -Hemoglobin A1c  3. Hypothyroidism, unspecified hypothyroidism type -TSH, future  4. Essential hypertension -Controlled  5. Seizures Controlled  6. Epigastric pain - pantoprazole (PROTONIX) 40 MG tablet; One daily to reduce stomach acid  Dispense: 90 tablet; Refill: 3  7. Edema Observe

## 2014-12-26 ENCOUNTER — Telehealth: Payer: Self-pay | Admitting: Oncology

## 2014-12-26 ENCOUNTER — Ambulatory Visit (HOSPITAL_BASED_OUTPATIENT_CLINIC_OR_DEPARTMENT_OTHER): Payer: Medicare Other

## 2014-12-26 DIAGNOSIS — C711 Malignant neoplasm of frontal lobe: Secondary | ICD-10-CM | POA: Diagnosis present

## 2014-12-26 DIAGNOSIS — C719 Malignant neoplasm of brain, unspecified: Secondary | ICD-10-CM

## 2014-12-26 LAB — CBC WITH DIFFERENTIAL/PLATELET
BASO%: 0.5 % (ref 0.0–2.0)
Basophils Absolute: 0 10*3/uL (ref 0.0–0.1)
EOS ABS: 0 10*3/uL (ref 0.0–0.5)
EOS%: 0.8 % (ref 0.0–7.0)
HCT: 37.8 % (ref 34.8–46.6)
HGB: 12.7 g/dL (ref 11.6–15.9)
LYMPH#: 0.5 10*3/uL — AB (ref 0.9–3.3)
LYMPH%: 10.9 % — ABNORMAL LOW (ref 14.0–49.7)
MCH: 32 pg (ref 25.1–34.0)
MCHC: 33.5 g/dL (ref 31.5–36.0)
MCV: 95.5 fL (ref 79.5–101.0)
MONO#: 0.4 10*3/uL (ref 0.1–0.9)
MONO%: 8.1 % (ref 0.0–14.0)
NEUT#: 3.8 10*3/uL (ref 1.5–6.5)
NEUT%: 79.7 % — ABNORMAL HIGH (ref 38.4–76.8)
Platelets: 173 10*3/uL (ref 145–400)
RBC: 3.96 10*6/uL (ref 3.70–5.45)
RDW: 16.7 % — AB (ref 11.2–14.5)
WBC: 4.7 10*3/uL (ref 3.9–10.3)

## 2014-12-26 LAB — COMPREHENSIVE METABOLIC PANEL (CC13)
ALBUMIN: 3.7 g/dL (ref 3.5–5.0)
ALT: 20 U/L (ref 0–55)
AST: 15 U/L (ref 5–34)
Alkaline Phosphatase: 58 U/L (ref 40–150)
Anion Gap: 9 mEq/L (ref 3–11)
BUN: 17 mg/dL (ref 7.0–26.0)
CO2: 25 mEq/L (ref 22–29)
Calcium: 9.3 mg/dL (ref 8.4–10.4)
Chloride: 109 mEq/L (ref 98–109)
Creatinine: 0.8 mg/dL (ref 0.6–1.1)
EGFR: 83 mL/min/{1.73_m2} — ABNORMAL LOW (ref >90)
Glucose: 107 mg/dl (ref 70–140)
Potassium: 3.8 mEq/L (ref 3.5–5.1)
Sodium: 143 mEq/L (ref 136–145)
TOTAL PROTEIN: 5.9 g/dL — AB (ref 6.4–8.3)
Total Bilirubin: 0.66 mg/dL (ref 0.20–1.20)

## 2014-12-26 LAB — HEMOGLOBIN A1C: HEMOGLOBIN A1C: 6 % (ref 4.0–6.0)

## 2014-12-26 NOTE — Telephone Encounter (Signed)
Pt. came in today to get labs done

## 2014-12-27 ENCOUNTER — Telehealth: Payer: Self-pay | Admitting: Oncology

## 2014-12-27 ENCOUNTER — Other Ambulatory Visit: Payer: Medicare Other

## 2014-12-27 ENCOUNTER — Ambulatory Visit (HOSPITAL_BASED_OUTPATIENT_CLINIC_OR_DEPARTMENT_OTHER): Payer: Medicare Other | Admitting: Oncology

## 2014-12-27 ENCOUNTER — Ambulatory Visit: Payer: Medicare Other | Admitting: Occupational Therapy

## 2014-12-27 ENCOUNTER — Encounter: Payer: Self-pay | Admitting: Occupational Therapy

## 2014-12-27 VITALS — BP 145/74 | HR 62 | Temp 98.4°F | Resp 18 | Ht 69.0 in | Wt 164.0 lb

## 2014-12-27 DIAGNOSIS — M79621 Pain in right upper arm: Secondary | ICD-10-CM

## 2014-12-27 DIAGNOSIS — Z86718 Personal history of other venous thrombosis and embolism: Secondary | ICD-10-CM

## 2014-12-27 DIAGNOSIS — I10 Essential (primary) hypertension: Secondary | ICD-10-CM

## 2014-12-27 DIAGNOSIS — G8191 Hemiplegia, unspecified affecting right dominant side: Secondary | ICD-10-CM

## 2014-12-27 DIAGNOSIS — C719 Malignant neoplasm of brain, unspecified: Secondary | ICD-10-CM

## 2014-12-27 DIAGNOSIS — R4701 Aphasia: Secondary | ICD-10-CM | POA: Diagnosis not present

## 2014-12-27 DIAGNOSIS — C711 Malignant neoplasm of frontal lobe: Secondary | ICD-10-CM

## 2014-12-27 DIAGNOSIS — M25511 Pain in right shoulder: Secondary | ICD-10-CM | POA: Diagnosis not present

## 2014-12-27 DIAGNOSIS — R21 Rash and other nonspecific skin eruption: Secondary | ICD-10-CM

## 2014-12-27 DIAGNOSIS — R279 Unspecified lack of coordination: Secondary | ICD-10-CM

## 2014-12-27 DIAGNOSIS — E119 Type 2 diabetes mellitus without complications: Secondary | ICD-10-CM | POA: Diagnosis not present

## 2014-12-27 DIAGNOSIS — R29898 Other symptoms and signs involving the musculoskeletal system: Secondary | ICD-10-CM

## 2014-12-27 DIAGNOSIS — R269 Unspecified abnormalities of gait and mobility: Secondary | ICD-10-CM | POA: Diagnosis not present

## 2014-12-27 NOTE — Therapy (Signed)
[facility] [address] [place], [place], [address] Phone: [phone]   Fax:  [phone]  Occupational Therapy Treatment  Patient Details  Name: Kelly Fletcher MRN: [id number] Date of Birth: 02-24-1951 Referring Provider:  Estill Dooms, MD  Encounter Date: 12/27/2014      OT End of Session - 12/27/14 1433    Visit Number 20   Number of Visits 30   Date for OT Re-Evaluation 02/09/15   Authorization Type MCR - G code needed   Authorization Time Period 60 Days   Authorization - Visit Number 20   Authorization - Number of Visits 20   OT Start Time 3846   OT Stop Time 1405   OT Time Calculation (min) 50 min   Equipment Utilized During Treatment UBE    Activity Tolerance Patient tolerated treatment well      Past Medical History  Diagnosis Date  . Uterine prolapse   . Cystocele   . GERD (gastroesophageal reflux disease)   . Elevated cholesterol   . Rectocele   . Hypertension   . Arthritis   . Type II or unspecified type diabetes mellitus without mention of complication, uncontrolled     Type 2  . Urinary incontinence     Urgency  . Anxiety state, unspecified   . Palpitations   . Allergic rhinitis, cause unspecified   . Other and unspecified hyperlipidemia   . Unspecified hypothyroidism   . Edema   . Insomnia, unspecified   . Depressive disorder, not elsewhere classified   . Unspecified urinary incontinence   . Peptic ulcer, unspecified site, unspecified as acute or chronic, without mention of hemorrhage, perforation, or obstruction   . Disturbance of skin sensation   . Calculus of kidney   . Facial spasm 04/05/2014  . Abnormal brain MRI 04/13/2014  . 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 Monilial vaginitis 10/23/2014    Past Surgical History  Procedure Laterality Date  . Tonsillectomy and adenoidectomy  1963  . Dilation and  curettage of uterus      3-05  . Hysteroscopy      3-05  . Endometrial biopsy      Dr.Gottsegen   . Tongue biopsy      There were no vitals filed for this visit.  Visit Diagnosis:  Hemiplegia affecting right dominant side  Lack of coordination  Weakness of right arm      Subjective Assessment - 12/27/14 1333    Patient is accompained by: Family member  husband   Pertinent History glioblastoma with brain surgery to remove tumor 06/28/14, facial seizures   Patient Stated Goals To get my Rt shoulder and hand better b/c I'm Rt handed   Currently in Pain? No/denies                      OT Treatments/Exercises (OP) - 12/27/14 0001    ADLs   ADL Comments Assessed progress for MCR progress note: see under "progress note". Pt also instructed to resume light grip strength with yellow putty secondary to no spasticity or difficulty with opening hand in full composite extension, and to potentially increase grip strength.    Fine Motor Coordination   Other Fine Motor Exercises Pt practiced writing with highlighter with limited control and going into pronation   Neurological Re-education Exercises   Other Exercises 1 RUE AA/ROM high  range shoulder flexion and abduction with focus on proximal stability and control using UE Ranger. Pt asked to go full motion, then hold, then come 1/2 way back to starting position and hold, then back up to high level. Then focused on forearm supination control (in prep for writing) with UE Ranger and shown how to perform at home with water bottle.    Other Exercises 2 UBE x 5 min for reciprocal movement pattern, RUE retraining/endurance, and attention to Rt side                  OT Short Term Goals - 12/27/14 1424    OT SHORT TERM GOAL #2   Title Pt to verbalize understanding with pain management strategies RUE (ongoing for renewal period - due 01/10/15)   Status Achieved   OT SHORT TERM GOAL #3   Title Pt to write name at 75% or  greater legibility w/ A/E for pen prn Rt dominant hand (due 01/10/15)   Baseline approx. 25% first name only with built up pen   Status On-going   OT SHORT TERM GOAL #4   Title Improve RUE functional use as evidenced by performing 20 blocks or greater on Box & Blocks test   Baseline eval = 17, 12/04/14 = 12, 12/27/14 = 12   Status On-going   OT SHORT TERM GOAL #5   Title Pt to improve grip strength Rt hand to 15 lbs or greater to decrease drops   Baseline eval = 10 lbs (Lt = 55 lbs); 12/04/14 = 10 lbs, 12/27/14 = 10 lbs   Status On-going   OT SHORT TERM GOAL #7   Title Pt to use Rt dominant hand to feed [place] with finger foods 50% of the time or greater   Baseline none with Rt hand, using Lt non dominant hand   Status On-going           OT Long Term Goals - 12/20/14 1550    OT LONG TERM GOAL #1   Title Independent with updated HEP for RUE strength (DUE 02/09/15)   Status On-going   OT LONG TERM GOAL #2   Title Pt to perform high level reaching RUE to retrieve/replace light weight object from high shelf 5 times w/o rest and min compensations   Baseline only 75% shoulder flexion   Status Revised   OT LONG TERM GOAL #3   Title Improve RUE functional use as evidenced by performing 30 blocks on Box & Blocks test    Status On-going   OT LONG TERM GOAL #4   Title Improve coordination as evidenced by performing 9 hole peg test in under  2 min.    Baseline eval: only able to place 1 peg in 60 sec.    Status On-going   OT LONG TERM GOAL #5   Title Pt to improve grip strength Rt dominant hand to 20 lbs or greater to open jars/containers   Baseline eval = 10 lbs, 12/04/14 = 10 lbs   Status On-going   OT LONG TERM GOAL #6   Title Pt to return to using Rt hand as dominant hand for all BADLS 78% of the time or greater   Baseline only using LUE   Status On-going   OT LONG TERM GOAL #7   Title Pt to write sentence with Rt dominant hand using A/E prn with 75% legibility    Baseline 25% legibility  for first name only   Status On-going   OT LONG  TERM GOAL #8   Title Pt to return to light cooking/cleaning tasks with min assist with A/E and/or DME prn   Baseline dependent   Status On-going               Plan - January 17, 2015 1434    Clinical Impression Statement Pt met STG #2. Pt with no changes in grip strength or coordination with Box & Blocks test. Pt does however demo increased control RUE during functional open chain reaching   Plan continue NMR, functional reaching, grip strength, coordination (next visit will be 21/30 for MCR reporting)   Consulted and Agree with Plan of Care Patient          G-Codes - 01/17/15 1329    Functional Assessment Tool Used RUE Grip = 10 lbs, Box & Blocks = 12, 9 hole peg = placed 2 in under 2 min. (Pt with improvements in shoulder ROM to 90* consistently with no shoulder pain)   Functional Limitation Carrying, moving and handling objects   Carrying, Moving and Handling Objects Current Status (T0160) At least 80 percent but less than 100 percent impaired, limited or restricted   Carrying, Moving and Handling Objects Goal Status (F0932) At least 40 percent but less than 60 percent impaired, limited or restricted      Problem List Patient Active Problem List   Diagnosis Date Noted  . Monilial vaginitis 10/23/2014  . Deep vein thrombosis 08/12/2014  . History of inferior vena caval filter placement 07/14/2014  . Malignant glioma of brain 06/28/2014  . Diabetes mellitus, type 2 06/27/2014  . 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 Tremor of both hands 04/05/2014  . Vertigo 04/05/2014  . BPPV (benign paroxysmal positional vertigo) 03/14/2014  . Seizures 12/16/2013  . Epigastric pain 11/01/2013  . Fever blister 10/16/2013  . Hypothyroidism 12/13/2012  . Other and unspecified hyperlipidemia 12/13/2012  . Anxiety state, unspecified 12/13/2012  . Edema 12/13/2012  . Anxiety state 12/13/2012  . Adult hypothyroidism 12/13/2012  . Urinary  incontinence   . DEPRESSION 09/24/2007  . Essential hypertension 09/24/2007  . GERD 09/24/2007  . HIATAL HERNIA 09/24/2007  . 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 Headache(784.0) 09/24/2007    Occupational Therapy Progress Note  Dates of Reporting Period: 11/26/14 to 01-17-15  Objective Reports of Subjective Statement: See clinical impressions. Pt reports no pain Rt shoulder 0/10 with consistent mid level open chain reaching Rt dominant hand with increased control. Pt can now consistently reach mid level. Pt with limited progress however in Rt hand coordination and grip strength. Pt still unable to write name and no changes in grip strength or coordination on Box & Blocks test.   Objective Measurements: Grip strength Rt hand = 10 lbs, Box & Blocks Rt = 12, Rt shoulder pain 0/10, consistent shoulder flex = 90*-100* with increased proximal/distal control   Goal Update: see above  Plan: continue with updated/revised STG's and LTG's from renewal period  Reason Skilled Services are Required: Pt would benefit from continued O.T. Services to maximize function in RUE motion, strength, and coordination in hopes of returning to using as dominant hand. Pt has had barriers to progress initially including Rt shoulder pain (which has resolved), and ongoing chemotherapy which may be affecting progress. However, pt still demonstrates potential for improvement based on the quality and control of movement, which has improved.    Carey Bullocks, OTR/L 17-Jan-2015, 2:37 PM  [facility] [address] [place], [place], [address] Phone: [phone]  Fax:  [phone]    

## 2014-12-27 NOTE — Telephone Encounter (Signed)
S/w pt and pt's spouse confirming MD visit on 09/07 per 07/27 POF D/T per MD on PAL day... KJ

## 2014-12-27 NOTE — Progress Notes (Signed)
  [place] OFFICE PROGRESS NOTE   Diagnosis: Glioblastoma Multiforme  INTERVAL HISTORY:   Ms. returns as scheduled. She completed another cycle of temozolomide beginning 12/13/2014. No mouth sores or nausea following chemotherapy. She has developed a rash over the face for the past month. No seizures or new neurologic symptoms. She continues outpatient physical therapy.  A brain MRI at [facility] on 11/29/2014 revealed no disease progression.    Objective:  Vital signs in last 24 hours:  Blood pressure 145/74, pulse 62, temperature 98.4 F (36.9 C), temperature source Oral, resp. rate 18, height 5' 9" (1.753 m), weight 164 lb (74.39 kg), SpO2 99 %.    HEENT: No thrush Resp: Lungs clear bilaterally Cardio: Regular rate and rhythm GI: No hepatosplenomegaly, nontender Vascular: The right lower leg is slightly larger than the left side, no erythema or tenderness. Neuro: Alert, expressive a aphasia, 4/5 strength in the right arm and right leg  Skin: Mild erythematous rash at the malar area    Lab Results:  Lab Results  Component Value Date   WBC 4.7 12/26/2014   HGB 12.7 12/26/2014   HCT 37.8 12/26/2014   MCV 95.5 12/26/2014   PLT 173 12/26/2014   NEUTROABS 3.8 12/26/2014     Medications: I have reviewed the patient's current medications.  Assessment/Plan: 1. 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 History of seizures secondary to #1  3. 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 Right-sided weakness and expressive  aphasia secondary to #1, completing a physical therapy program  5. Diabetes  6. Hypertension  7. Hyperlipidemia  8. Pain at the right upper arm/shoulder   9.   Mild facial rash-unlikely related temozolomide, rosacea?Marland Kitchen She will see her dermatologist    Disposition:  Ms. Wigfall appears unchanged. She has completed 3 cycles of adjuvant temozolomide. She is scheduled to begin cycle 4 on 01/10/2015. She will return for a CBC on 01/09/2015. She is scheduled for restaging MRI of the brain and follow-up at [facility] in early September. I will see her on 02/06/2015.  I recommended she follow-up with neurology to discuss the seizure regimen. She will see her dermatologist to evaluate the facial rash.  She was diagnosed with a deep vein thrombosis in February and underwent placement of an IVC filter. I will contact the neuro oncology service at [facility] to discuss the indication for removing the filter and placing her on anticoagulation therapy.    Betsy Coder, MD  12/27/2014  8:57 AM

## 2014-12-27 NOTE — Telephone Encounter (Signed)
Pt confirmed labs/ov per 07/28 POF, gave pt AVS and Calendar... KJ, Schedule Leader MD added pt to schedule due to this is a PAL day for MD but approved per MD D/T.... Cherylann Banas

## 2015-01-01 ENCOUNTER — Ambulatory Visit: Payer: Medicare Other | Admitting: Physical Therapy

## 2015-01-01 ENCOUNTER — Encounter: Payer: Self-pay | Admitting: Occupational Therapy

## 2015-01-01 ENCOUNTER — Encounter: Payer: Self-pay | Admitting: Physical Therapy

## 2015-01-01 ENCOUNTER — Ambulatory Visit: Payer: Medicare Other | Attending: Radiology | Admitting: Occupational Therapy

## 2015-01-01 DIAGNOSIS — R2681 Unsteadiness on feet: Secondary | ICD-10-CM | POA: Diagnosis present

## 2015-01-01 DIAGNOSIS — R279 Unspecified lack of coordination: Secondary | ICD-10-CM | POA: Insufficient documentation

## 2015-01-01 DIAGNOSIS — R269 Unspecified abnormalities of gait and mobility: Secondary | ICD-10-CM | POA: Diagnosis present

## 2015-01-01 DIAGNOSIS — R2689 Other abnormalities of gait and mobility: Secondary | ICD-10-CM

## 2015-01-01 DIAGNOSIS — R29898 Other symptoms and signs involving the musculoskeletal system: Secondary | ICD-10-CM | POA: Diagnosis present

## 2015-01-01 DIAGNOSIS — R4189 Other symptoms and signs involving cognitive functions and awareness: Secondary | ICD-10-CM | POA: Insufficient documentation

## 2015-01-01 DIAGNOSIS — M6289 Other specified disorders of muscle: Secondary | ICD-10-CM | POA: Diagnosis present

## 2015-01-01 DIAGNOSIS — R6889 Other general symptoms and signs: Secondary | ICD-10-CM | POA: Insufficient documentation

## 2015-01-01 DIAGNOSIS — R29818 Other symptoms and signs involving the nervous system: Secondary | ICD-10-CM | POA: Diagnosis present

## 2015-01-01 DIAGNOSIS — G8191 Hemiplegia, unspecified affecting right dominant side: Secondary | ICD-10-CM | POA: Diagnosis present

## 2015-01-01 NOTE — Therapy (Signed)
[facility] [address] [place], [place], [address] Phone: [phone]   Fax:  [phone]  Physical Therapy Treatment  Patient Details  Name: Kelly Fletcher MRN: [id number] Date of Birth: November 18, 1950 Referring Provider:  Estill Dooms, MD  Encounter Date: 01/01/2015      PT End of Session - 01/01/15 1451    Visit Number 17   Number of Visits 22   Date for PT Re-Evaluation 02/05/15   Authorization Type G-code & KX modifier   PT Start Time 1446   PT Stop Time 1528   PT Time Calculation (min) 42 min   Equipment Utilized During Treatment Gait belt   Activity Tolerance Patient tolerated treatment well   Behavior During Therapy [facility] for tasks assessed/performed      Past Medical History  Diagnosis Date  . Uterine prolapse   . Cystocele   . GERD (gastroesophageal reflux disease)   . Elevated cholesterol   . Rectocele   . Hypertension   . Arthritis   . Type II or unspecified type diabetes mellitus without mention of complication, uncontrolled     Type 2  . Urinary incontinence     Urgency  . Anxiety state, unspecified   . Palpitations   . Allergic rhinitis, cause unspecified   . Other and unspecified hyperlipidemia   . Unspecified hypothyroidism   . Edema   . Insomnia, unspecified   . Depressive disorder, not elsewhere classified   . Unspecified urinary incontinence   . Peptic ulcer, unspecified site, unspecified as acute or chronic, without mention of hemorrhage, perforation, or obstruction   . Disturbance of skin sensation   . Calculus of kidney   . Facial spasm 04/05/2014  . Abnormal brain MRI 04/13/2014  . 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 Monilial vaginitis 10/23/2014    Past Surgical History  Procedure Laterality Date  . Tonsillectomy and adenoidectomy  1963  . Dilation and curettage of uterus      3-05  . Hysteroscopy      3-05  . Endometrial biopsy      Dr.Gottsegen   . Tongue biopsy      There were no vitals filed for this visit.  Visit Diagnosis:  Abnormality of gait  Balance problems  Decreased functional activity tolerance  Lack of coordination  Right leg weakness          [facility] PT Assessment - 01/01/15 1459    Functional Gait  Assessment   Gait Level Surface Walks 20 ft in less than 7 sec but greater than 5.5 sec, uses assistive device, slower speed, mild gait deviations, or deviates 6-10 in outside of the 12 in walkway width.  >6 sec's   Change in Gait Speed Able to smoothly change walking speed without loss of balance or gait deviation. Deviate no more than 6 in outside of the 12 in walkway width.   Gait with Horizontal Head Turns Performs head turns smoothly with no change in gait. Deviates no more than 6 in outside 12 in walkway width   Gait with Vertical Head Turns Performs head turns with no change in gait. Deviates no more than 6 in outside 12 in walkway width.   Gait and Pivot Turn Pivot turns safely within 3 sec and stops quickly with no loss of balance.   Step Over Obstacle Is able to step over 2 stacked shoe  boxes taped together (9 in total height) without changing gait speed. No evidence of imbalance.   Gait with Narrow Base of Support Ambulates less than 4 steps heel to toe or cannot perform without assistance.  reports ankle rolls, only able to perform 2 steps   Gait with Eyes Closed Walks 20 ft, uses assistive device, slower speed, mild gait deviations, deviates 6-10 in outside 12 in walkway width. Ambulates 20 ft in less than 9 sec but greater than 7 sec.   Ambulating Backwards Walks 20 ft, no assistive devices, good speed, no evidence for imbalance, normal gait   Steps Alternating feet, must use rail.   Total Score 24          [facility] Adult PT Treatment/Exercise - 01/01/15 1459    Ambulation/Gait   Ambulation/Gait Yes   Ambulation/Gait Assistance 5: Supervision    Ambulation Distance (Feet) 500 Feet   Assistive device None   Gait Pattern Step-through pattern   Ambulation Surface Level;Unlevel;Indoor;Outdoor;Paved;Gravel;Grass  mostly grass     Neuro rehab: Gait with conitive task A-Z - with bil UE ball toss while naming animals a-z, min guard assist, cues to maintain pace and task's  - with left UE only tennis ball toss naming foods a-z with min guard assist, cues to maintain pace and task's  Standing with feet across red foam beam Fwd foot taps while listing ingredients and then directions to make her favorite fruit pie, with PTA asking intermittent questions to further challenge multitasking with cognitive tasks. Min guard assist with cues on weight shifting for balance and on posture.        PT Short Term Goals - 01/01/15 1453    PT SHORT TERM GOAL #1   Title patient correcty demonstrates initial HEP with husband cueing. (Target Date: 11/09/2014)   Time 4   Period Weeks   Status Achieved   PT SHORT TERM GOAL #2   Title Timed Up & Go <13.5 sec without device (Target Date: 11/09/2014)  Current time is 8.13 seconds (11/08/14)   Time 4   Period Weeks   Status Achieved   PT SHORT TERM GOAL #3   Title Berg Balance >46/56 (Target Date: 11/09/2014)  Current Kelly Fletcher is 54/56 (11/08/14)   Time 4   Period Weeks   Status Achieved   PT SHORT TERM GOAL #4   Title ambulates 500' without device including grass surfaces, ramps & curbs with supervision. (Target Date: 11/09/2014)   Time 4   Period Weeks   Status Achieved   PT SHORT TERM GOAL #5   Title ambulates 200' without device on grass with supervision while carrying on conversation. (Target Date: 01/04/2015)   Baseline 01/01/15: 500 feet with supervison on grass (400 feet) and pavement (100 feet) while engaged in conversation, pt doing most of the talking.   Time 4   Period Weeks   Status Achieved   PT SHORT TERM GOAL #6   Title Functional Gait Assessment >/= 25/30 (Target Date: 01/04/2015)   Time  4   Period Weeks   Status New           PT Long Term Goals - 12/06/14 1100    PT LONG TERM GOAL #1   Title demonstrates / verbalizes understanding of ongoing HEP / fitness plan. (UPDATED Target Date: 02/01/2015)   Baseline Met: 12/06/14 but with continued PT HEP / fitness plan will need to be progressed further.    Time 8   Period Weeks   Status On-going  PT LONG TERM GOAL #2   Title Cognitive Timed Up & Go <15 sec safely  (Target Date: 12/07/2014)   Baseline Met: 12/06/14 14.77 sec   Time 8   Period Weeks   Status Achieved   PT LONG TERM GOAL #3   Title Berg Balance >/=52/56  (UPDATED Target Date: 02/01/2015)   Baseline NOT MET 12/06/2014 Kelly Fletcher Balance 44/56    Time 8   Period Weeks   Status On-going   PT LONG TERM GOAL #4   Title Functional Gait Assessment >25/ 30  (UPDATED Target Date: 02/01/2015)   Baseline Partially MET 12/06/2014 FGA 23/30   Status On-going   PT LONG TERM GOAL #5   Title ambulates >1000' without device including grass, ramps, curbs, stairs indpendently.  (Target Date: 12/07/2014)   Baseline partially met 12/06/14; pt able to ambulate >1000' without device independently on level surfaces indoors and outdoors, but still requires supervision for safety on grass   Time 8   Period Weeks   Status Partially Met   Additional Long Term Goals   Additional Long Term Goals Yes   PT LONG TERM GOAL #6   Title Cognitive Timed Up & Go <15% increase from standard TUG (UPDATED Target Date: 02/01/2015)   Time 8   Period Weeks   Status New   PT LONG TERM GOAL #7   Title Patient ambulates 200' on grass without device while carrying on conversation modified independent. (UPDATED Target Date: 02/01/2015)   Time 8   Period Weeks   Status New         01/01/15 1451  Plan  Clinical Impression Statement Pt met 1 of 2 STG'S. Progressing well towards unmet STG and LTG's.   Pt will benefit from skilled therapeutic intervention in order to improve on the following deficits Abnormal  gait;Decreased activity tolerance;Decreased balance;Decreased endurance;Decreased strength  Rehab Potential Good  PT Frequency 1x / week  PT Duration 8 weeks  PT Treatment/Interventions ADLs/[facility] Management;DME Instruction;Gait training;Stair training;Functional mobility training;Therapeutic activities;Therapeutic exercise;Balance training;Neuromuscular re-education;Patient/family education  PT Next Visit Plan cont with strengthening and high level balance training  PT Home Exercise Plan hip abduction strengthening for bil. LE's  Consulted and Agree with Plan of Care Patient;Family member/caregiver  Family Member Consulted husband    Problem List Patient Active Problem List   Diagnosis Date Noted  . Monilial vaginitis 10/23/2014  . Deep vein thrombosis 08/12/2014  . History of inferior vena caval filter placement 07/14/2014  . Malignant glioma of brain 06/28/2014  . Diabetes mellitus, type 2 06/27/2014  . 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 Tremor of both hands 04/05/2014  . Vertigo 04/05/2014  . BPPV (benign paroxysmal positional vertigo) 03/14/2014  . Seizures 12/16/2013  . Epigastric pain 11/01/2013  . Fever blister 10/16/2013  . Hypothyroidism 12/13/2012  . Other and unspecified hyperlipidemia 12/13/2012  . Anxiety state, unspecified 12/13/2012  . Edema 12/13/2012  . Anxiety state 12/13/2012  . Adult hypothyroidism 12/13/2012  . Urinary incontinence   . DEPRESSION 09/24/2007  . Essential hypertension 09/24/2007  . GERD 09/24/2007  . HIATAL HERNIA 09/24/2007  . 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 Headache(784.0) 09/24/2007    Willow Ora 01/02/2015, 10:48 PM  Willow Ora, PTA, [facility] [address] [place], [place] [address] [phone] 01/02/2015, 10:48 PM

## 2015-01-01 NOTE — Therapy (Signed)
[facility] [address] [place], [place], [address] Phone: [phone]   Fax:  [phone]  Occupational Therapy Treatment  Patient Details  Name: Kelly Fletcher MRN: [id number] Date of Birth: November 06, 1950 Referring Provider:  Estill Dooms, MD  Encounter Date: 01/01/2015      OT End of Session - 01/01/15 1830    Visit Number 21   Number of Visits 30   Date for OT Re-Evaluation 02/09/15   Authorization Type MCR - G code needed   OT Start Time 0947   OT Stop Time 1700   OT Time Calculation (min) 43 min   Activity Tolerance Patient tolerated treatment well      Past Medical History  Diagnosis Date  . Uterine prolapse   . Cystocele   . GERD (gastroesophageal reflux disease)   . Elevated cholesterol   . Rectocele   . Hypertension   . Arthritis   . Type II or unspecified type diabetes mellitus without mention of complication, uncontrolled     Type 2  . Urinary incontinence     Urgency  . Anxiety state, unspecified   . Palpitations   . Allergic rhinitis, cause unspecified   . Other and unspecified hyperlipidemia   . Unspecified hypothyroidism   . Edema   . Insomnia, unspecified   . Depressive disorder, not elsewhere classified   . Unspecified urinary incontinence   . Peptic ulcer, unspecified site, unspecified as acute or chronic, without mention of hemorrhage, perforation, or obstruction   . Disturbance of skin sensation   . Calculus of kidney   . Facial spasm 04/05/2014  . Abnormal brain MRI 04/13/2014  . 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 Monilial vaginitis 10/23/2014    Past Surgical History  Procedure Laterality Date  . Tonsillectomy and adenoidectomy  1963  . Dilation and curettage of uterus      3-05  . Hysteroscopy      3-05  . Endometrial biopsy      Dr.Gottsegen   . Tongue biopsy      There were no vitals filed for  this visit.  Visit Diagnosis:  Hemiplegia affecting right dominant side  Lack of coordination  Weakness of right arm      Subjective Assessment - 01/01/15 1625    Subjective  I really want to write better   Patient is accompained by: Family member  husband   Pertinent History glioblastoma with brain surgery to remove tumor 06/28/14, facial seizures   Patient Stated Goals To get my Rt shoulder and hand better b/c I'm Rt handed   Currently in Pain? No/denies                      OT Treatments/Exercises (OP) - 01/01/15 0001    ADLs   Writing Practiced writing with built up marker using coban to build up and provide increased sensory information. Pt requires max vc's to use vision to compensate for sensory loss and apraxia. Pt able to legibly write first name in 4/6 trials   Neurological Re-education Exercises   Other Exercises 1 Neuro re ed to address functional use of RUE in reaching, picking up small cylindrical objects using finger tips vs gross grasp. Pt needs increased time, max vc's and significant repetition.  OT Short Term Goals - 01/01/15 1828    OT SHORT TERM GOAL #2   Title Pt to verbalize understanding with pain management strategies RUE (ongoing for renewal period - due 01/10/15)   Status Achieved   OT SHORT TERM GOAL #3   Title Pt to write name at 75% or greater legibility w/ A/E for pen prn Rt dominant hand (due 01/10/15)   Baseline approx. 25% first name only with built up pen   Status On-going   OT SHORT TERM GOAL #4   Title Improve RUE functional use as evidenced by performing 20 blocks or greater on Box & Blocks test   Baseline eval = 17, 12/04/14 = 12, 12/27/14 = 12   Status On-going   OT SHORT TERM GOAL #5   Title Pt to improve grip strength Rt hand to 15 lbs or greater to decrease drops   Baseline eval = 10 lbs (Lt = 55 lbs); 12/04/14 = 10 lbs, 12/27/14 = 10 lbs   Status On-going   OT SHORT TERM GOAL #7   Title Pt to use  Rt dominant hand to feed Roel with finger foods 50% of the time or greater   Baseline none with Rt hand, using Lt non dominant hand   Status On-going           OT Long Term Goals - 01/01/15 1828    OT LONG TERM GOAL #1   Title Independent with updated HEP for RUE strength (DUE 02/09/15)   Status On-going   OT LONG TERM GOAL #2   Title Pt to perform high level reaching RUE to retrieve/replace light weight object from high shelf 5 times w/o rest and min compensations   Baseline only 75% shoulder flexion   Status Revised   OT LONG TERM GOAL #3   Title Improve RUE functional use as evidenced by performing 30 blocks on Box & Blocks test    Status On-going   OT LONG TERM GOAL #4   Title Improve coordination as evidenced by performing 9 hole peg test in under  2 min.    Baseline eval: only able to place 1 peg in 60 sec.    Status On-going   OT LONG TERM GOAL #5   Title Pt to improve grip strength Rt dominant hand to 20 lbs or greater to open jars/containers   Baseline eval = 10 lbs, 12/04/14 = 10 lbs   Status On-going   OT LONG TERM GOAL #6   Title Pt to return to using Rt hand as dominant hand for all BADLS 33% of the time or greater   Baseline only using LUE   Status On-going   OT LONG TERM GOAL #7   Title Pt to write sentence with Rt dominant hand using A/E prn with 75% legibility    Baseline 25% legibility for first name only   Status On-going   OT LONG TERM GOAL #8   Title Pt to return to light cooking/cleaning tasks with min assist with A/E and/or DME prn   Baseline dependent   Status On-going               Plan - 01/01/15 1828    Clinical Impression Statement Pt with slow progress toward goals. Pt has good active movement and some strength in R hand however with significant sensory loss and apraxia therefore impacting consistent functional use of the hand.   Pt will benefit from skilled therapeutic intervention in order to improve on the following  deficits (Retired)  Decreased cognition;Decreased knowledge of use of DME;Impaired flexibility;Pain;Decreased coordination;Decreased mobility;Impaired sensation;Improper body mechanics;Decreased activity tolerance;Decreased endurance;Decreased range of motion;Decreased strength;Impaired tone;Decreased balance;Decreased knowledge of precautions;Decreased safety awareness;Impaired UE functional use;Impaired perceived functional ability   Rehab Potential Good   Clinical Impairments Affecting Rehab Potential pain RUE, apraxia, impaired sensation, impaired cogntion   OT Frequency 2x / week   OT Duration 8 weeks   OT Treatment/Interventions [place]-care/ADL training;Electrical Stimulation;Therapeutic exercise;Cognitive remediation/compensation;Moist Heat;Parrafin;Neuromuscular education;Splinting;Visual/perceptual remediation/compensation;Fluidtherapy;Energy conservation;Therapist, nutritional;Therapeutic exercises;Patient/family education;DME and/or AE instruction;Manual Therapy;Passive range of motion;Therapeutic activities   Plan NMR to RUE, functional reach, grip strength, functional use of R hand   Consulted and Agree with Plan of Care Patient        Problem List Patient Active Problem List   Diagnosis Date Noted  . Monilial vaginitis 10/23/2014  . Deep vein thrombosis 08/12/2014  . History of inferior vena caval filter placement 07/14/2014  . Malignant glioma of brain 06/28/2014  . Diabetes mellitus, type 2 06/27/2014  . 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 Tremor of both hands 04/05/2014  . Vertigo 04/05/2014  . BPPV (benign paroxysmal positional vertigo) 03/14/2014  . Seizures 12/16/2013  . Epigastric pain 11/01/2013  . Fever blister 10/16/2013  . Hypothyroidism 12/13/2012  . Other and unspecified hyperlipidemia 12/13/2012  . Anxiety state, unspecified 12/13/2012  . Edema 12/13/2012  . Anxiety state 12/13/2012  . Adult hypothyroidism 12/13/2012  . Urinary incontinence   . DEPRESSION  09/24/2007  . Essential hypertension 09/24/2007  . GERD 09/24/2007  . HIATAL HERNIA 09/24/2007  . 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 Headache(784.0) 09/24/2007    Quay Burow, OTR/L 01/01/2015, 6:31 PM  [facility] [address] [place], [place], [address] Phone: [phone]   Fax:  [phone]

## 2015-01-02 ENCOUNTER — Telehealth: Payer: Self-pay | Admitting: *Deleted

## 2015-01-02 NOTE — Telephone Encounter (Signed)
Received Hemoglobin A1C from [facility] and it was 6.0 Per Dr. Charlott Holler is ok Patient husband notified and agreed. Abstracted to patient's chart.

## 2015-01-03 ENCOUNTER — Ambulatory Visit: Payer: Medicare Other | Admitting: Occupational Therapy

## 2015-01-03 ENCOUNTER — Encounter: Payer: Self-pay | Admitting: Occupational Therapy

## 2015-01-03 DIAGNOSIS — G8191 Hemiplegia, unspecified affecting right dominant side: Secondary | ICD-10-CM

## 2015-01-03 DIAGNOSIS — R29898 Other symptoms and signs involving the musculoskeletal system: Secondary | ICD-10-CM

## 2015-01-03 DIAGNOSIS — R279 Unspecified lack of coordination: Secondary | ICD-10-CM

## 2015-01-03 NOTE — Therapy (Signed)
[facility] [address] [name], [place], [address] Phone: [phone]   Fax:  [phone]  Occupational Therapy Treatment  Patient Details  Name: Kelly Fletcher MRN: [id number] Date of Birth: [birth year] Referring Provider:  Estill Dooms, MD  Encounter Date: 01/03/2015      OT End of Session - 01/03/15 1509    Visit Number 22   Number of Visits 30   Date for OT Re-Evaluation 02/09/15   Authorization Type MCR - G code needed   Authorization Time Period 70 Days   Authorization - Visit Number 14   Authorization - Number of Visits 30   OT Start Time 1315   OT Stop Time 1400   OT Time Calculation (min) 45 min   Activity Tolerance Patient tolerated treatment well      Past Medical History  Diagnosis Date  . Uterine prolapse   . Cystocele   . GERD (gastroesophageal reflux disease)   . Elevated cholesterol   . Rectocele   . Hypertension   . Arthritis   . Type II or unspecified type diabetes mellitus without mention of complication, uncontrolled     Type 2  . Urinary incontinence     Urgency  . Anxiety state, unspecified   . Palpitations   . Allergic rhinitis, cause unspecified   . Other and unspecified hyperlipidemia   . Unspecified hypothyroidism   . Edema   . Insomnia, unspecified   . Depressive disorder, not elsewhere classified   . Unspecified urinary incontinence   . Peptic ulcer, unspecified site, unspecified as acute or chronic, without mention of hemorrhage, perforation, or obstruction   . Disturbance of skin sensation   . Calculus of kidney   . Facial spasm 04/05/2014  . Abnormal brain MRI 04/13/2014  . 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 Monilial vaginitis 10/23/2014    Past Surgical History  Procedure Laterality Date  . Tonsillectomy and adenoidectomy  1963  . Dilation and curettage of uterus      3-05  . Hysteroscopy       3-05  . Endometrial biopsy      Dr.Gottsegen   . Tongue biopsy      There were no vitals filed for this visit.  Visit Diagnosis:  Lack of coordination  Hemiplegia affecting right dominant side  Weakness of right arm      Subjective Assessment - 01/03/15 1324    Subjective  I am using my R hand to help fix my hair   Patient is accompained by: Family member  husband   Pertinent History glioblastoma with brain surgery to remove tumor 06/28/14, facial seizures   Patient Stated Goals To get my Rt shoulder and hand better b/c I'm Rt handed   Currently in Pain? No/denies                      OT Treatments/Exercises (OP) - 01/03/15 0001    ADLs   Eating Treatment focused on [place] feeding both finger foods as well as using utensils. Pt has adequate ROM and strength for finger foods however continues to report great difficulty in this task. After observation with various finger foods, attempted use of mirror to compensate for proprioceptive loss in R hand as pt appeared unable to find the food once her hand was out of sight and close to her face. With mirror  pt was able to eat finger foods mod I (still clumsy but able to do so ) with a variety of foods. Pt also able to use fork with build up and coban wrapped on build up to provide more sensory input and use of mirror to guide food mouth. 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 Pt provided with additional red foam and has coban at home. Instructed both pt and husband in techniques and they were able to return demonstrate.                OT Education - 01/03/15 1506    Education provided Yes   Education Details eating strategies   Person(s) Educated Patient;Spouse   Methods Explanation;Demonstration;Tactile cues;Verbal cues   Comprehension Verbalized understanding;Returned demonstration           OT Short Term Goals - 01/03/15 1506    OT SHORT TERM GOAL #2   Title Pt to verbalize understanding with pain management strategies RUE (ongoing for renewal period - due 01/10/15)   Status Achieved   OT SHORT TERM GOAL #3   Title Pt to write name at 75% or greater legibility w/ A/E for pen prn Rt dominant hand (due 01/10/15)   Baseline approx. 25% first name only with built up pen   Status Achieved   OT SHORT TERM GOAL #4   Title Improve RUE functional use as evidenced by performing 20 blocks or greater on Box & Blocks test   Baseline eval = 17, 12/04/14 = 12, 12/27/14 = 12   Status On-going   OT SHORT TERM GOAL #5   Title Pt to improve grip strength Rt hand to 15 lbs or greater to decrease drops   Baseline eval = 10 lbs (Lt = 55 lbs); 12/04/14 = 10 lbs, 12/27/14 = 10 lbs   Status On-going   OT SHORT TERM GOAL #7   Title Pt to use Rt dominant hand to feed [place] with finger foods 50% of the time or greater   Baseline none with Rt hand, using Lt non dominant hand   Status On-going           OT Long Term Goals - 01/03/15 1507    OT LONG TERM GOAL #1   Title Independent with updated HEP for RUE strength (DUE 02/09/15)   Status On-going   OT LONG TERM GOAL #2   Title Pt to perform high level reaching RUE to retrieve/replace light weight object from high shelf 5 times w/o rest and min compensations   Baseline only 75% shoulder flexion   Status Revised   OT LONG TERM GOAL #3   Title Improve RUE functional use as evidenced by performing 30 blocks on Box & Blocks test    Status On-going   OT LONG TERM GOAL #4   Title Improve coordination as evidenced by performing 9 hole peg test in under  2 min.    Baseline eval: only able to place 1 peg in 60 sec.    Status On-going   OT LONG TERM GOAL #5   Title Pt to improve grip strength Rt dominant hand to 20 lbs or greater to open jars/containers   Baseline eval = 10 lbs, 12/04/14 = 10 lbs   Status On-going   OT LONG TERM GOAL #6   Title Pt to  return to using Rt hand as dominant hand  for all BADLS 36% of the time or greater   Baseline only using LUE   Status On-going   OT LONG TERM GOAL #7   Title Pt to write sentence with Rt dominant hand using A/E prn with 75% legibility    Baseline 25% legibility for first name only   Status On-going   OT LONG TERM GOAL #8   Title Pt to return to light cooking/cleaning tasks with min assist with A/E and/or DME prn   Baseline dependent   Status On-going               Plan - 01/03/15 1508    Clinical Impression Statement Pt with sigificant break through today for [place] feeding using mirror, built up handle and strategies for decreased sensation and apraxia. Pt and husband educated in strategies able to return demonstrate   Pt will benefit from skilled therapeutic intervention in order to improve on the following deficits (Retired) Decreased cognition;Decreased knowledge of use of DME;Impaired flexibility;Pain;Decreased coordination;Decreased mobility;Impaired sensation;Improper body mechanics;Decreased activity tolerance;Decreased endurance;Decreased range of motion;Decreased strength;Impaired tone;Decreased balance;Decreased knowledge of precautions;Decreased safety awareness;Impaired UE functional use;Impaired perceived functional ability   Rehab Potential Good   Clinical Impairments Affecting Rehab Potential pain RUE, apraxia, impaired sensation, impaired cogntion   OT Frequency 2x / week   OT Duration 8 weeks   OT Treatment/Interventions [place]-care/ADL training;Electrical Stimulation;Therapeutic exercise;Cognitive remediation/compensation;Moist Heat;Parrafin;Neuromuscular education;Splinting;Visual/perceptual remediation/compensation;Fluidtherapy;Energy conservation;Therapist, nutritional;Therapeutic exercises;Patient/family education;DME and/or AE instruction;Manual Therapy;Passive range of motion;Therapeutic activities   Plan NMR RUE, functional reach, eating, grip strength,  functional use of hand   Consulted and Agree with Plan of Care Patient   Family Member Consulted HUSBAND        Problem List Patient Active Problem List   Diagnosis Date Noted  . Monilial vaginitis 10/23/2014  . Deep vein thrombosis 08/12/2014  . History of inferior vena caval filter placement 07/14/2014  . Malignant glioma of brain 06/28/2014  . Diabetes mellitus, type 2 06/27/2014  . 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 Tremor of both hands 04/05/2014  . Vertigo 04/05/2014  . BPPV (benign paroxysmal positional vertigo) 03/14/2014  . Seizures 12/16/2013  . Epigastric pain 11/01/2013  . Fever blister 10/16/2013  . Hypothyroidism 12/13/2012  . Other and unspecified hyperlipidemia 12/13/2012  . Anxiety state, unspecified 12/13/2012  . Edema 12/13/2012  . Anxiety state 12/13/2012  . Adult hypothyroidism 12/13/2012  . Urinary incontinence   . DEPRESSION 09/24/2007  . Essential hypertension 09/24/2007  . GERD 09/24/2007  . HIATAL HERNIA 09/24/2007  . 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 Headache(784.0) 09/24/2007    Quay Burow, OTR/L 01/03/2015, 3:16 PM  [facility] [address] [place], [place], [address] Phone: [phone]   Fax:  [phone]

## 2015-01-03 NOTE — Patient Instructions (Signed)
Eating Strategies

## 2015-01-08 ENCOUNTER — Ambulatory Visit: Payer: Medicare Other | Admitting: Physical Therapy

## 2015-01-08 ENCOUNTER — Encounter: Payer: Self-pay | Admitting: Occupational Therapy

## 2015-01-08 ENCOUNTER — Ambulatory Visit: Payer: Medicare Other | Admitting: Occupational Therapy

## 2015-01-08 DIAGNOSIS — R6889 Other general symptoms and signs: Secondary | ICD-10-CM

## 2015-01-08 DIAGNOSIS — G8191 Hemiplegia, unspecified affecting right dominant side: Secondary | ICD-10-CM | POA: Diagnosis not present

## 2015-01-08 DIAGNOSIS — R2689 Other abnormalities of gait and mobility: Secondary | ICD-10-CM

## 2015-01-08 DIAGNOSIS — R4189 Other symptoms and signs involving cognitive functions and awareness: Secondary | ICD-10-CM

## 2015-01-08 DIAGNOSIS — R29898 Other symptoms and signs involving the musculoskeletal system: Secondary | ICD-10-CM

## 2015-01-08 DIAGNOSIS — R269 Unspecified abnormalities of gait and mobility: Secondary | ICD-10-CM

## 2015-01-08 DIAGNOSIS — R279 Unspecified lack of coordination: Secondary | ICD-10-CM

## 2015-01-08 DIAGNOSIS — R2681 Unsteadiness on feet: Secondary | ICD-10-CM

## 2015-01-08 NOTE — Therapy (Signed)
[facility] [address] [place], [place], [address] Phone: [phone]   Fax:  [phone]  Occupational Therapy Treatment  Patient Details  Name: Kelly Fletcher MRN: [id number] Date of Birth: [birth year] Referring Provider:  Estill Dooms, MD  Encounter Date: 01/08/2015      OT End of Session - 01/08/15 1318    Visit Number 23   Number of Visits 30   Date for OT Re-Evaluation 02/09/15   Authorization Type MCR - G code needed   OT Start Time 1147   OT Stop Time 1231   OT Time Calculation (min) 44 min   Activity Tolerance Patient tolerated treatment well      Past Medical History  Diagnosis Date  . Uterine prolapse   . Cystocele   . GERD (gastroesophageal reflux disease)   . Elevated cholesterol   . Rectocele   . Hypertension   . Arthritis   . Type II or unspecified type diabetes mellitus without mention of complication, uncontrolled     Type 2  . Urinary incontinence     Urgency  . Anxiety state, unspecified   . Palpitations   . Allergic rhinitis, cause unspecified   . Other and unspecified hyperlipidemia   . Unspecified hypothyroidism   . Edema   . Insomnia, unspecified   . Depressive disorder, not elsewhere classified   . Unspecified urinary incontinence   . Peptic ulcer, unspecified site, unspecified as acute or chronic, without mention of hemorrhage, perforation, or obstruction   . Disturbance of skin sensation   . Calculus of kidney   . Facial spasm 04/05/2014  . Abnormal brain MRI 04/13/2014  . 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 Monilial vaginitis 10/23/2014    Past Surgical History  Procedure Laterality Date  . Tonsillectomy and adenoidectomy  1963  . Dilation and curettage of uterus      3-05  . Hysteroscopy      3-05  . Endometrial biopsy      Dr.Gottsegen   . Tongue biopsy      There were no vitals filed for  this visit.  Visit Diagnosis:  Lack of coordination  Hemiplegia affecting right dominant side  Weakness of right arm  Cognitive deficits      Subjective Assessment - 01/08/15 1153    Subjective  I am starting chemo again tomorrow if blood work is ok (oral chemo - on 5 days off 23)   Patient is accompained by: Family member  husband   Pertinent History glioblastoma with brain surgery to remove tumor 06/28/14, facial seizures   Patient Stated Goals To get my Rt shoulder and hand better b/c I'm Rt handed   Currently in Pain? No/denies                      OT Treatments/Exercises (OP) - 01/08/15 0001    ADLs   Cooking Treatment focused on simple cooking task (making pudding) with emphasis on encouraging increased use of RUE. Pt requires max vc's to truly incorporate RUE into task as well as mod cues to decrease verbosity as pt [place] distracts  Pt is able to use her RUE as a gross assist for several tasks with cueing to use her hand and to look at her hand when attempting to use it. Pt uses hand mostly as gross assist but at times is able  to use a non dominant. Pt and husband report eating is going much better and pt is now eating greater than 75% of finger foods and approximately 20% of simple foods with fork.                   OT Short Term Goals - 01/08/15 1315    OT SHORT TERM GOAL #2   Title Pt to verbalize understanding with pain management strategies RUE (ongoing for renewal period - due 01/10/15)   Status Achieved   OT SHORT TERM GOAL #3   Title Pt to write name at 75% or greater legibility w/ A/E for pen prn Rt dominant hand (due 01/10/15)   Baseline approx. 25% first name only with built up pen   Status Achieved   OT SHORT TERM GOAL #4   Title Improve RUE functional use as evidenced by performing 20 blocks or greater on Box & Blocks test   Baseline eval = 17, 12/04/14 = 12, 12/27/14 = 12   Status Not Met   OT SHORT TERM GOAL #5   Title Pt to improve grip  strength Rt hand to 15 lbs or greater to decrease drops   Baseline eval = 10 lbs (Lt = 55 lbs); 12/04/14 = 10 lbs, 12/27/14 = 10 lbs   Status Not Met   OT SHORT TERM GOAL #7   Title Pt to use Rt dominant hand to feed [place] with finger foods 50% of the time or greater   Baseline none with Rt hand, using Lt non dominant hand   Status Achieved           OT Long Term Goals - 01/08/15 1315    OT LONG TERM GOAL #1   Title Independent with updated HEP for RUE strength (DUE 02/09/15)   Status On-going   OT LONG TERM GOAL #2   Title Pt to perform high level reaching RUE to retrieve/replace light weight object from high shelf 5 times w/o rest and min compensations   Baseline only 75% shoulder flexion   Status Revised   OT LONG TERM GOAL #3   Title Improve RUE functional use as evidenced by performing 30 blocks on Box & Blocks test    Status On-going   OT LONG TERM GOAL #4   Title Improve coordination as evidenced by performing 9 hole peg test in under  2 min.    Baseline eval: only able to place 1 peg in 60 sec.    Status On-going   OT LONG TERM GOAL #5   Title Pt to improve grip strength Rt dominant hand to 20 lbs or greater to open jars/containers   Baseline eval = 10 lbs, 12/04/14 = 10 lbs   Status On-going   OT LONG TERM GOAL #6   Title Pt to return to using Rt hand as dominant hand for all BADLS 57% of the time or greater   Baseline only using LUE   Status On-going   OT LONG TERM GOAL #7   Title Pt to write sentence with Rt dominant hand using A/E prn with 75% legibility    Baseline 25% legibility for first name only   Status On-going   OT LONG TERM GOAL #8   Title Pt to return to light cooking/cleaning tasks with min assist with A/E and/or DME prn   Baseline dependent   Status On-going               Plan - 01/08/15 1316  Clinical Impression Statement Pt continues with slow gains toward goals. Pt reports she is likely starting chemo tomorrow so discussed home program and  how to modify if fatigued. Pt is able to use R hand much more functionally with cueing and encouragement as well as structured for increased attention.   Pt will benefit from skilled therapeutic intervention in order to improve on the following deficits (Retired) Decreased cognition;Decreased knowledge of use of DME;Impaired flexibility;Pain;Decreased coordination;Decreased mobility;Impaired sensation;Improper body mechanics;Decreased activity tolerance;Decreased endurance;Decreased range of motion;Decreased strength;Impaired tone;Decreased balance;Decreased knowledge of precautions;Decreased safety awareness;Impaired UE functional use;Impaired perceived functional ability   Rehab Potential Good   Clinical Impairments Affecting Rehab Potential pain RUE, apraxia, impaired sensation, impaired cogntion   OT Frequency 2x / week   OT Duration 8 weeks   OT Treatment/Interventions [place]-care/ADL training;Electrical Stimulation;Therapeutic exercise;Cognitive remediation/compensation;Moist Heat;Parrafin;Neuromuscular education;Splinting;Visual/perceptual remediation/compensation;Fluidtherapy;Energy conservation;Therapist, nutritional;Therapeutic exercises;Patient/family education;DME and/or AE instruction;Manual Therapy;Passive range of motion;Therapeutic activities   Plan addtional cooking activities to encourage automatic use of hand, other functional reach activities.        Problem List Patient Active Problem List   Diagnosis Date Noted  . Monilial vaginitis 10/23/2014  . Deep vein thrombosis 08/12/2014  . History of inferior vena caval filter placement 07/14/2014  . Malignant glioma of brain 06/28/2014  . Diabetes mellitus, type 2 06/27/2014  . 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 Tremor of both hands 04/05/2014  . Vertigo 04/05/2014  . BPPV (benign paroxysmal positional vertigo) 03/14/2014  . Seizures 12/16/2013  . Epigastric pain 11/01/2013  . Fever blister 10/16/2013  .  Hypothyroidism 12/13/2012  . Other and unspecified hyperlipidemia 12/13/2012  . Anxiety state, unspecified 12/13/2012  . Edema 12/13/2012  . Anxiety state 12/13/2012  . Adult hypothyroidism 12/13/2012  . Urinary incontinence   . DEPRESSION 09/24/2007  . Essential hypertension 09/24/2007  . GERD 09/24/2007  . HIATAL HERNIA 09/24/2007  . 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 Headache(784.0) 09/24/2007    Quay Burow, OTR/L 01/08/2015, 1:21 PM  [facility] [address] [place], [place], [address] Phone: [phone]   Fax:  [phone]

## 2015-01-09 ENCOUNTER — Other Ambulatory Visit (HOSPITAL_BASED_OUTPATIENT_CLINIC_OR_DEPARTMENT_OTHER): Payer: Medicare Other

## 2015-01-09 ENCOUNTER — Telehealth: Payer: Self-pay | Admitting: *Deleted

## 2015-01-09 ENCOUNTER — Telehealth: Payer: Self-pay | Admitting: Oncology

## 2015-01-09 DIAGNOSIS — C711 Malignant neoplasm of frontal lobe: Secondary | ICD-10-CM | POA: Diagnosis present

## 2015-01-09 DIAGNOSIS — C719 Malignant neoplasm of brain, unspecified: Secondary | ICD-10-CM

## 2015-01-09 LAB — COMPREHENSIVE METABOLIC PANEL (CC13)
ALBUMIN: 3.8 g/dL (ref 3.5–5.0)
ALT: 28 U/L (ref 0–55)
AST: 15 U/L (ref 5–34)
Alkaline Phosphatase: 61 U/L (ref 40–150)
Anion Gap: 6 mEq/L (ref 3–11)
BUN: 12.4 mg/dL (ref 7.0–26.0)
CO2: 29 meq/L (ref 22–29)
CREATININE: 0.8 mg/dL (ref 0.6–1.1)
Calcium: 9.3 mg/dL (ref 8.4–10.4)
Chloride: 107 mEq/L (ref 98–109)
EGFR: 83 mL/min/{1.73_m2} — AB (ref >90)
Glucose: 113 mg/dl (ref 70–140)
Potassium: 5.1 mEq/L (ref 3.5–5.1)
Sodium: 142 mEq/L (ref 136–145)
TOTAL PROTEIN: 5.9 g/dL — AB (ref 6.4–8.3)
Total Bilirubin: 0.42 mg/dL (ref 0.20–1.20)

## 2015-01-09 LAB — CBC WITH DIFFERENTIAL/PLATELET
BASO%: 0.3 % (ref 0.0–2.0)
BASOS ABS: 0 10*3/uL (ref 0.0–0.1)
EOS ABS: 0.2 10*3/uL (ref 0.0–0.5)
EOS%: 4.9 % (ref 0.0–7.0)
HEMATOCRIT: 35.6 % (ref 34.8–46.6)
HGB: 12.2 g/dL (ref 11.6–15.9)
LYMPH%: 51.3 % — ABNORMAL HIGH (ref 14.0–49.7)
MCH: 32.5 pg (ref 25.1–34.0)
MCHC: 34.3 g/dL (ref 31.5–36.0)
MCV: 94.9 fL (ref 79.5–101.0)
MONO#: 0.5 10*3/uL (ref 0.1–0.9)
MONO%: 16.7 % — AB (ref 0.0–14.0)
NEUT%: 26.8 % — ABNORMAL LOW (ref 38.4–76.8)
NEUTROS ABS: 0.8 10*3/uL — AB (ref 1.5–6.5)
Platelets: 27 10*3/uL — ABNORMAL LOW (ref 145–400)
RBC: 3.75 10*6/uL (ref 3.70–5.45)
RDW: 14.7 % — ABNORMAL HIGH (ref 11.2–14.5)
WBC: 3.1 10*3/uL — AB (ref 3.9–10.3)
lymph#: 1.6 10*3/uL (ref 0.9–3.3)
nRBC: 0 % (ref 0–0)

## 2015-01-09 LAB — TECHNOLOGIST REVIEW

## 2015-01-09 NOTE — Telephone Encounter (Signed)
Spoke with pt, informed of her PLT 27k. Hold Temodar. Check CBC 8/12. She voiced understanding, I also spoke with pt's husband. They understand to expect call from schedulers for appointment.

## 2015-01-09 NOTE — Telephone Encounter (Signed)
-----   Message from Ladell Pier, MD sent at 01/09/2015  2:50 PM EDT ----- Please call patient, wbcs and platelets are low, hold Temodar, check cbc 8/12 Call for fever or bleeding

## 2015-01-09 NOTE — Therapy (Signed)
[facility] [address] [place], [place], [address] Phone: [phone]   Fax:  [phone]  Physical Therapy Treatment  Patient Details  Name: Kelly Fletcher MRN: [id number] Date of Birth: 09-Apr-1951 Referring Provider:  Estill Dooms, MD  Encounter Date: 01/08/2015      PT End of Session - 01/08/15 1100    Visit Number 18   Number of Visits 22   Date for PT Re-Evaluation 02/05/15   Authorization Type G-code & KX modifier   PT Start Time 1105   PT Stop Time 1148   PT Time Calculation (min) 43 min   Equipment Utilized During Treatment Gait belt   Activity Tolerance Patient tolerated treatment well   Behavior During Therapy WFL for tasks assessed/performed      Past Medical History  Diagnosis Date  . Uterine prolapse   . Cystocele   . GERD (gastroesophageal reflux disease)   . Elevated cholesterol   . Rectocele   . Hypertension   . Arthritis   . Type II or unspecified type diabetes mellitus without mention of complication, uncontrolled     Type 2  . Urinary incontinence     Urgency  . Anxiety state, unspecified   . Palpitations   . Allergic rhinitis, cause unspecified   . Other and unspecified hyperlipidemia   . Unspecified hypothyroidism   . Edema   . Insomnia, unspecified   . Depressive disorder, not elsewhere classified   . Unspecified urinary incontinence   . Peptic ulcer, unspecified site, unspecified as acute or chronic, without mention of hemorrhage, perforation, or obstruction   . Disturbance of skin sensation   . Calculus of kidney   . Facial spasm 04/05/2014  . Abnormal brain MRI 04/13/2014  . 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 Monilial vaginitis 10/23/2014    Past Surgical History  Procedure Laterality Date  . Tonsillectomy and adenoidectomy  1963  . Dilation and curettage of uterus      3-05  . Hysteroscopy      3-05  . Endometrial biopsy      Dr.Gottsegen   . Tongue biopsy      There were no vitals filed for this visit.  Visit Diagnosis:  Hemiplegia affecting right dominant side  Abnormality of gait  Balance problems  Decreased functional activity tolerance  Right leg weakness  Unsteadiness      Subjective Assessment - 01/08/15 1116    Subjective Some dizziness with her seizure medications.    Currently in Pain? No/denies     Kelly Fletcher-care: Patient & her husband arrived with concerns over amount of exercises, how hard she is pushing herself / Kelly Fletcher expectations, energy level especially with anticipating starting chemo again. Long discussion (40 minutes +) to recommend balancing out HEP / activity level - see patient education. Patient was tearful at times with fear of lack of progress if she backs off any - PT reassured should not be big issue.                            PT Education - 01/08/15 1100    Education provided Yes   Education Details rounding out HEP from PT, OT & speech to not overwhelm / fatigue, breaking exercises into one group for M, W, F & 2nd group for Tu, Th, Sat then within each group  performing a few exercises at a time breaking them up during day, grouping exercises by position / location, Need at least 1 day/wk for body to rest, Reduce reps over cutting out exercises on days that she has less energy especially with chemo restarting.   Person(s) Educated Patient;Spouse   Methods Explanation   Comprehension Verbalized understanding          PT Short Term Goals - 01/01/15 1453    PT SHORT TERM GOAL #1   Title patient correcty demonstrates initial HEP with husband cueing. (Target Date: 11/09/2014)   Time 4   Period Weeks   Status Achieved   PT SHORT TERM GOAL #2   Title Timed Up & Go <13.5 sec without device (Target Date: 11/09/2014)  Current time is 8.13 seconds (11/08/14)   Time 4   Period Weeks   Status Achieved   PT SHORT  TERM GOAL #3   Title Berg Balance >46/56 (Target Date: 11/09/2014)  Current Kelly Fletcher is 54/56 (11/08/14)   Time 4   Period Weeks   Status Achieved   PT SHORT TERM GOAL #4   Title ambulates 500' without device including grass surfaces, ramps & curbs with supervision. (Target Date: 11/09/2014)   Time 4   Period Weeks   Status Achieved   PT SHORT TERM GOAL #5   Title ambulates 200' without device on grass with supervision while carrying on conversation. (Target Date: 01/04/2015)   Baseline 01/01/15: 500 feet with supervison on grass (400 feet) and pavement (100 feet) while engaged in conversation, pt doing most of the talking.   Time --   Period --   Status Achieved   PT SHORT TERM GOAL #6   Title Functional Gait Assessment >/= 25/30 (Target Date: 01/04/2015)   Baseline 10/01/14: 24/30 scored   Time --   Period --   Status Not Met           PT Long Term Goals - 12/06/14 1100    PT LONG TERM GOAL #1   Title demonstrates / verbalizes understanding of ongoing HEP / fitness plan. (UPDATED Target Date: 02/01/2015)   Baseline Met: 12/06/14 but with continued PT HEP / fitness plan will need to be progressed further.    Time 8   Period Weeks   Status On-going   PT LONG TERM GOAL #2   Title Cognitive Timed Up & Go <15 sec safely  (Target Date: 12/07/2014)   Baseline Met: 12/06/14 14.77 sec   Time 8   Period Weeks   Status Achieved   PT LONG TERM GOAL #3   Title Berg Balance >/=52/56  (UPDATED Target Date: 02/01/2015)   Baseline NOT MET 12/06/2014 Kelly Fletcher Balance 44/56    Time 8   Period Weeks   Status On-going   PT LONG TERM GOAL #4   Title Functional Gait Assessment >25/ 30  (UPDATED Target Date: 02/01/2015)   Baseline Partially MET 12/06/2014 FGA 23/30   Status On-going   PT LONG TERM GOAL #5   Title ambulates >1000' without device including grass, ramps, curbs, stairs indpendently.  (Target Date: 12/07/2014)   Baseline partially met 12/06/14; pt able to ambulate >1000' without device independently on level  surfaces indoors and outdoors, but still requires supervision for safety on grass   Time 8   Period Weeks   Status Partially Met   Additional Long Term Goals   Additional Long Term Goals Yes   PT LONG TERM GOAL #6   Title Cognitive Timed Up &  Go <15% increase from standard TUG (UPDATED Target Date: 02/01/2015)   Time 8   Period Weeks   Status New   PT LONG TERM GOAL #7   Title Patient ambulates 200' on grass without device while carrying on conversation modified independent. (UPDATED Target Date: 02/01/2015)   Time 8   Period Weeks   Status New               Plan - 01/08/15 1100    Clinical Impression Statement Patient & husband concern with balancing out exercises with energy level especially with restarting chemo appears to have been answered with long discussion. Patient & husband report they feel better with energy  issues with chemo and still able to exercise.    Pt will benefit from skilled therapeutic intervention in order to improve on the following deficits Abnormal gait;Decreased activity tolerance;Decreased balance;Decreased endurance;Decreased strength   Rehab Potential Good   PT Frequency 1x / week   PT Duration 8 weeks   PT Treatment/Interventions ADLs/[facility];DME Instruction;Gait training;Stair training;Functional mobility training;Therapeutic activities;Therapeutic exercise;Balance training;Neuromuscular re-education;Patient/family education   PT Next Visit Plan cont with strengthening and high level balance training   PT Home Exercise Plan hip abduction strengthening for bil. LE's   Consulted and Agree with Plan of Care Patient;Family member/caregiver   Family Member Consulted husband        Problem List Patient Active Problem List   Diagnosis Date Noted  . Monilial vaginitis 10/23/2014  . Deep vein thrombosis 08/12/2014  . History of inferior vena caval filter placement 07/14/2014  . Malignant glioma of brain 06/28/2014  . Diabetes  mellitus, type 2 06/27/2014  . 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 Tremor of both hands 04/05/2014  . Vertigo 04/05/2014  . BPPV (benign paroxysmal positional vertigo) 03/14/2014  . Seizures 12/16/2013  . Epigastric pain 11/01/2013  . Fever blister 10/16/2013  . Hypothyroidism 12/13/2012  . Other and unspecified hyperlipidemia 12/13/2012  . Anxiety state, unspecified 12/13/2012  . Edema 12/13/2012  . Anxiety state 12/13/2012  . Adult hypothyroidism 12/13/2012  . Urinary incontinence   . DEPRESSION 09/24/2007  . Essential hypertension 09/24/2007  . GERD 09/24/2007  . HIATAL HERNIA 09/24/2007  . 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 ONGEXBMW(413.2) 09/24/2007    Dorothy Polhemus PT, DPT 01/09/2015, 12:24 PM  [facility] [address] [place], [place], [address] Phone: [phone]   Fax:  [phone]

## 2015-01-09 NOTE — Telephone Encounter (Signed)
Spoke with patient and she is aware of her appointments 8/12

## 2015-01-10 ENCOUNTER — Ambulatory Visit: Payer: Medicare Other | Admitting: Occupational Therapy

## 2015-01-11 ENCOUNTER — Ambulatory Visit: Payer: Medicare Other

## 2015-01-11 ENCOUNTER — Other Ambulatory Visit (HOSPITAL_BASED_OUTPATIENT_CLINIC_OR_DEPARTMENT_OTHER): Payer: Medicare Other

## 2015-01-11 DIAGNOSIS — C711 Malignant neoplasm of frontal lobe: Secondary | ICD-10-CM | POA: Diagnosis present

## 2015-01-11 DIAGNOSIS — C719 Malignant neoplasm of brain, unspecified: Secondary | ICD-10-CM

## 2015-01-11 LAB — CBC WITH DIFFERENTIAL/PLATELET
BASO%: 0.5 % (ref 0.0–2.0)
Basophils Absolute: 0 10*3/uL (ref 0.0–0.1)
EOS%: 3.8 % (ref 0.0–7.0)
Eosinophils Absolute: 0.1 10*3/uL (ref 0.0–0.5)
HCT: 36.4 % (ref 34.8–46.6)
HGB: 12.2 g/dL (ref 11.6–15.9)
LYMPH%: 26.8 % (ref 14.0–49.7)
MCH: 32.3 pg (ref 25.1–34.0)
MCHC: 33.5 g/dL (ref 31.5–36.0)
MCV: 96.3 fL (ref 79.5–101.0)
MONO#: 0.4 10*3/uL (ref 0.1–0.9)
MONO%: 12.9 % (ref 0.0–14.0)
NEUT%: 56 % (ref 38.4–76.8)
NEUTROS ABS: 1.9 10*3/uL (ref 1.5–6.5)
Platelets: 49 10*3/uL — ABNORMAL LOW (ref 145–400)
RBC: 3.78 10*6/uL (ref 3.70–5.45)
RDW: 16.4 % — AB (ref 11.2–14.5)
WBC: 3.5 10*3/uL — ABNORMAL LOW (ref 3.9–10.3)
lymph#: 0.9 10*3/uL (ref 0.9–3.3)

## 2015-01-11 NOTE — Progress Notes (Signed)
Pt platelets 49- per Dr. Benay Spice, hold Temodar and have platelets rechecked 8/17 or 8/18. No platelet transfusion needed today. Information relayed to patient and husband. POF sent to scheduler and pt sent to scheduling.

## 2015-01-14 ENCOUNTER — Ambulatory Visit: Payer: Medicare Other | Admitting: Occupational Therapy

## 2015-01-14 ENCOUNTER — Encounter: Payer: Self-pay | Admitting: Occupational Therapy

## 2015-01-14 DIAGNOSIS — R29898 Other symptoms and signs involving the musculoskeletal system: Secondary | ICD-10-CM

## 2015-01-14 DIAGNOSIS — R6889 Other general symptoms and signs: Secondary | ICD-10-CM

## 2015-01-14 DIAGNOSIS — R279 Unspecified lack of coordination: Secondary | ICD-10-CM

## 2015-01-14 DIAGNOSIS — G8191 Hemiplegia, unspecified affecting right dominant side: Secondary | ICD-10-CM | POA: Diagnosis not present

## 2015-01-14 NOTE — Therapy (Signed)
[facility] [address] [place], [place], [address] Phone: [phone]   Fax:  [phone]  Occupational Therapy Treatment  Patient Details  Name: Kelly Fletcher MRN: [id number] Date of Birth: [birth year] Referring Provider:  Estill Dooms, MD  Encounter Date: 01/14/2015      OT End of Session - 01/14/15 1336    Visit Number 24   Number of Visits 30   Date for OT Re-Evaluation 02/09/15   Authorization Type MCR - G code needed   Authorization Time Period 29 Days   Authorization - Visit Number 24   Authorization - Number of Visits 30   OT Start Time 1100   OT Stop Time 1145   OT Time Calculation (min) 45 min   Activity Tolerance Patient tolerated treatment well      Past Medical History  Diagnosis Date  . Uterine prolapse   . Cystocele   . GERD (gastroesophageal reflux disease)   . Elevated cholesterol   . Rectocele   . Hypertension   . Arthritis   . Type II or unspecified type diabetes mellitus without mention of complication, uncontrolled     Type 2  . Urinary incontinence     Urgency  . Anxiety state, unspecified   . Palpitations   . Allergic rhinitis, cause unspecified   . Other and unspecified hyperlipidemia   . Unspecified hypothyroidism   . Edema   . Insomnia, unspecified   . Depressive disorder, not elsewhere classified   . Unspecified urinary incontinence   . Peptic ulcer, unspecified site, unspecified as acute or chronic, without mention of hemorrhage, perforation, or obstruction   . Disturbance of skin sensation   . Calculus of kidney   . Facial spasm 04/05/2014  . Abnormal brain MRI 04/13/2014  . 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 Monilial vaginitis 10/23/2014    Past Surgical History  Procedure Laterality Date  . Tonsillectomy and adenoidectomy  1963  . Dilation and curettage of uterus      3-05  .  Hysteroscopy      3-05  . Endometrial biopsy      Dr.Gottsegen   . Tongue biopsy      There were no vitals filed for this visit.  Visit Diagnosis:  Hemiplegia affecting right dominant side  Decreased functional activity tolerance  Lack of coordination  Weakness of right hand      Subjective Assessment - 01/14/15 1100    Subjective  They couldn't start the chemo because my platelets were too low. They may start Wed (August 17th) if my platelets are high enough   Patient is accompained by: Family member   Pertinent History glioblastoma with brain surgery to remove tumor 06/28/14, facial seizures   Patient Stated Goals To get my Rt shoulder and hand better b/c I'm Rt handed   Currently in Pain? No/denies                      OT Treatments/Exercises (OP) - 01/14/15 0001    ADLs   Cooking simple meal prep task (making muffin batter and pouring into baking dish) with less cues (only min cues) to use Rt hand as assist. Pt able to consistently use Rt hand as stabalizer and inconsistently using as gross assist. Pt able to stir batter with Rt hand 75%, then finished with Lt hand. Pt then washing  bowl with Rt hand and Lt as assist. Pt then loaded dishwasher with Rt hand for all light items. Required Lt hand for heavier objects   Functional Reaching Activities   Low Level low level functional reaching to take yellow clothespins off wire for control,  coordination, and pinch strength. Pt occasionally required assist from Lt hand. Pt returned 3 clothespins to wire with mod assist to stabalize (due to lack of control, coordination and pinch strength)                  OT Short Term Goals - 01/08/15 1315    OT SHORT TERM GOAL #2   Title Pt to verbalize understanding with pain management strategies RUE (ongoing for renewal period - due 01/10/15)   Status Achieved   OT SHORT TERM GOAL #3   Title Pt to write name at 75% or greater legibility w/ A/E for pen prn Rt dominant  hand (due 01/10/15)   Baseline approx. 25% first name only with built up pen   Status Achieved   OT SHORT TERM GOAL #4   Title Improve RUE functional use as evidenced by performing 20 blocks or greater on Box & Blocks test   Baseline eval = 17, 12/04/14 = 12, 12/27/14 = 12   Status Not Met   OT SHORT TERM GOAL #5   Title Pt to improve grip strength Rt hand to 15 lbs or greater to decrease drops   Baseline eval = 10 lbs (Lt = 55 lbs); 12/04/14 = 10 lbs, 12/27/14 = 10 lbs   Status Not Met   OT SHORT TERM GOAL #7   Title Pt to use Rt dominant hand to feed [place] with finger foods 50% of the time or greater   Baseline none with Rt hand, using Lt non dominant hand   Status Achieved           OT Long Term Goals - 01/08/15 1315    OT LONG TERM GOAL #1   Title Independent with updated HEP for RUE strength (DUE 02/09/15)   Status On-going   OT LONG TERM GOAL #2   Title Pt to perform high level reaching RUE to retrieve/replace light weight object from high shelf 5 times w/o rest and min compensations   Baseline only 75% shoulder flexion   Status Revised   OT LONG TERM GOAL #3   Title Improve RUE functional use as evidenced by performing 30 blocks on Box & Blocks test    Status On-going   OT LONG TERM GOAL #4   Title Improve coordination as evidenced by performing 9 hole peg test in under  2 min.    Baseline eval: only able to place 1 peg in 60 sec.    Status On-going   OT LONG TERM GOAL #5   Title Pt to improve grip strength Rt dominant hand to 20 lbs or greater to open jars/containers   Baseline eval = 10 lbs, 12/04/14 = 10 lbs   Status On-going   OT LONG TERM GOAL #6   Title Pt to return to using Rt hand as dominant hand for all BADLS 26% of the time or greater   Baseline only using LUE   Status On-going   OT LONG TERM GOAL #7   Title Pt to write sentence with Rt dominant hand using A/E prn with 75% legibility    Baseline 25% legibility for first name only   Status On-going   OT LONG TERM  GOAL #8  Title Pt to return to light cooking/cleaning tasks with min assist with A/E and/or DME prn   Baseline dependent   Status On-going               Plan - 01/14/15 1336    Clinical Impression Statement Pt with increased use of Rt hand during meal prep task and less cueing required.    Pt will benefit from skilled therapeutic intervention in order to improve on the following deficits (Retired) Decreased cognition;Decreased knowledge of use of DME;Impaired flexibility;Pain;Decreased coordination;Decreased mobility;Impaired sensation;Improper body mechanics;Decreased activity tolerance;Decreased endurance;Decreased range of motion;Decreased strength;Impaired tone;Decreased balance;Decreased knowledge of precautions;Decreased safety awareness;Impaired UE functional use;Impaired perceived functional ability   Plan continue functional use of Rt hand as able, reaching RUE   Consulted and Agree with Plan of Care Patient;Family member/caregiver   Family Member Consulted HUSBAND        Problem List Patient Active Problem List   Diagnosis Date Noted  . Monilial vaginitis 10/23/2014  . Deep vein thrombosis 08/12/2014  . History of inferior vena caval filter placement 07/14/2014  . Malignant glioma of brain 06/28/2014  . Diabetes mellitus, type 2 06/27/2014  . 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 Tremor of both hands 04/05/2014  . Vertigo 04/05/2014  . BPPV (benign paroxysmal positional vertigo) 03/14/2014  . Seizures 12/16/2013  . Epigastric pain 11/01/2013  . Fever blister 10/16/2013  . Hypothyroidism 12/13/2012  . Other and unspecified hyperlipidemia 12/13/2012  . Anxiety state, unspecified 12/13/2012  . Edema 12/13/2012  . Anxiety state 12/13/2012  . Adult hypothyroidism 12/13/2012  . Urinary incontinence   . DEPRESSION 09/24/2007  . Essential hypertension 09/24/2007  . GERD 09/24/2007  . HIATAL HERNIA 09/24/2007  . 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 Headache(784.0)  09/24/2007    Carey Bullocks, OTR/L 01/14/2015, 1:38 PM  [facility] [address] [place], [place], [address] Phone: [phone]   Fax:  [phone]

## 2015-01-16 ENCOUNTER — Other Ambulatory Visit (HOSPITAL_BASED_OUTPATIENT_CLINIC_OR_DEPARTMENT_OTHER): Payer: Medicare Other

## 2015-01-16 ENCOUNTER — Ambulatory Visit: Payer: Medicare Other

## 2015-01-16 ENCOUNTER — Other Ambulatory Visit: Payer: Self-pay | Admitting: *Deleted

## 2015-01-16 ENCOUNTER — Telehealth: Payer: Self-pay | Admitting: *Deleted

## 2015-01-16 ENCOUNTER — Telehealth: Payer: Self-pay | Admitting: Oncology

## 2015-01-16 DIAGNOSIS — C711 Malignant neoplasm of frontal lobe: Secondary | ICD-10-CM

## 2015-01-16 DIAGNOSIS — C719 Malignant neoplasm of brain, unspecified: Secondary | ICD-10-CM

## 2015-01-16 LAB — CBC WITH DIFFERENTIAL/PLATELET
BASO%: 0.8 % (ref 0.0–2.0)
Basophils Absolute: 0 10*3/uL (ref 0.0–0.1)
EOS%: 2.7 % (ref 0.0–7.0)
Eosinophils Absolute: 0.2 10*3/uL (ref 0.0–0.5)
HEMATOCRIT: 36.1 % (ref 34.8–46.6)
HEMOGLOBIN: 12.1 g/dL (ref 11.6–15.9)
LYMPH%: 14.5 % (ref 14.0–49.7)
MCH: 32.8 pg (ref 25.1–34.0)
MCHC: 33.5 g/dL (ref 31.5–36.0)
MCV: 98 fL (ref 79.5–101.0)
MONO#: 0.3 10*3/uL (ref 0.1–0.9)
MONO%: 5.4 % (ref 0.0–14.0)
NEUT%: 76.6 % (ref 38.4–76.8)
NEUTROS ABS: 4.3 10*3/uL (ref 1.5–6.5)
Platelets: 91 10*3/uL — ABNORMAL LOW (ref 145–400)
RBC: 3.68 10*6/uL — ABNORMAL LOW (ref 3.70–5.45)
RDW: 16.7 % — AB (ref 11.2–14.5)
WBC: 5.6 10*3/uL (ref 3.9–10.3)
lymph#: 0.8 10*3/uL — ABNORMAL LOW (ref 0.9–3.3)

## 2015-01-16 NOTE — Telephone Encounter (Signed)
Labs/ov added to schedule per 08/17 POF, sent msg to add blood transfusion and to give pt updated schedule before she leaves.Marland Kitchen KJ

## 2015-01-16 NOTE — Telephone Encounter (Signed)
Per staff message and POF I have scheduled appts. Advised scheduler of appts. And to move lab JMW

## 2015-01-16 NOTE — Progress Notes (Signed)
Platelets 91, no transfusion today. Patient instructed to hold Temodar,pof sent for CBC check next week. Patient verbalized understanding.

## 2015-01-17 ENCOUNTER — Ambulatory Visit: Payer: Medicare Other | Admitting: Physical Therapy

## 2015-01-17 ENCOUNTER — Ambulatory Visit: Payer: Medicare Other | Admitting: Occupational Therapy

## 2015-01-17 ENCOUNTER — Encounter: Payer: Self-pay | Admitting: Occupational Therapy

## 2015-01-17 ENCOUNTER — Encounter: Payer: Self-pay | Admitting: Physical Therapy

## 2015-01-17 DIAGNOSIS — R2689 Other abnormalities of gait and mobility: Secondary | ICD-10-CM

## 2015-01-17 DIAGNOSIS — R279 Unspecified lack of coordination: Secondary | ICD-10-CM

## 2015-01-17 DIAGNOSIS — G8191 Hemiplegia, unspecified affecting right dominant side: Secondary | ICD-10-CM | POA: Diagnosis not present

## 2015-01-17 DIAGNOSIS — R269 Unspecified abnormalities of gait and mobility: Secondary | ICD-10-CM

## 2015-01-17 DIAGNOSIS — R29898 Other symptoms and signs involving the musculoskeletal system: Secondary | ICD-10-CM

## 2015-01-17 DIAGNOSIS — R6889 Other general symptoms and signs: Secondary | ICD-10-CM

## 2015-01-17 NOTE — Therapy (Signed)
[facility] [address] [place], [place], [address] Phone: [phone]   Fax:  [phone]  Physical Therapy Treatment  Patient Details  Name: Kelly Fletcher MRN: [id number] Date of Birth: 12/30/1950 Referring Provider:  Estill Dooms, MD  Encounter Date: 01/17/2015      PT End of Session - 01/17/15 1409    Visit Number 19   Number of Visits 22   Date for PT Re-Evaluation 02/05/15   Authorization Type G-code & KX modifier   PT Start Time 1405   PT Stop Time 1444   PT Time Calculation (min) 39 min   Equipment Utilized During Treatment Gait belt   Activity Tolerance Patient tolerated treatment well   Behavior During Therapy WFL for tasks assessed/performed      Past Medical History  Diagnosis Date  . Uterine prolapse   . Cystocele   . GERD (gastroesophageal reflux disease)   . Elevated cholesterol   . Rectocele   . Hypertension   . Arthritis   . Type II or unspecified type diabetes mellitus without mention of complication, uncontrolled     Type 2  . Urinary incontinence     Urgency  . Anxiety state, unspecified   . Palpitations   . Allergic rhinitis, cause unspecified   . Other and unspecified hyperlipidemia   . Unspecified hypothyroidism   . Edema   . Insomnia, unspecified   . Depressive disorder, not elsewhere classified   . Unspecified urinary incontinence   . Peptic ulcer, unspecified site, unspecified as acute or chronic, without mention of hemorrhage, perforation, or obstruction   . Disturbance of skin sensation   . Calculus of kidney   . Facial spasm 04/05/2014  . Abnormal brain MRI 04/13/2014  . 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 Monilial vaginitis 10/23/2014    Past Surgical History  Procedure Laterality Date  . Tonsillectomy and adenoidectomy  1963  . Dilation and curettage of uterus      3-05  . Hysteroscopy       3-05  . Endometrial biopsy      Dr.Gottsegen   . Tongue biopsy      There were no vitals filed for this visit.  Visit Diagnosis:  Decreased functional activity tolerance  Abnormality of gait  Balance problems      Subjective Assessment - 01/17/15 1408    Subjective No new complaints. Saw Md and her platelets were low. They are trending up at this time. Goes next Wed for follow up and if still low may have to have a transfusion. Has not started chemo due to this. No falls or pain to report.   Currently in Pain? No/denies   Pain Score 0-No pain     Treatment: Gait  >/= 1000 ft indoor/outdoors with conversation,supervison to min guard assist.  >/= 1000 feet indoors while single UE ball toss and engaged in cognitive task naming A-Z animals and then A-Z food's,supervision.   on blue mats with tall cones: alternating forward toe taps while giving therapist recipes for chocolate chip cake and rolled lasagna. Ingrediants, step by step instructions for prepping and cook method/time/temperature, min assist for balance and cues to redirect to tasks at times.          PT Short Term Goals - 01/01/15 1453    PT SHORT TERM GOAL #1   Title patient correcty demonstrates initial  HEP with husband cueing. (Target Date: 11/09/2014)   Time 4   Period Weeks   Status Achieved   PT SHORT TERM GOAL #2   Title Timed Up & Go <13.5 sec without device (Target Date: 11/09/2014)  Current time is 8.13 seconds (11/08/14)   Time 4   Period Weeks   Status Achieved   PT SHORT TERM GOAL #3   Title Berg Balance >46/56 (Target Date: 11/09/2014)  Current [name] is 54/56 (11/08/14)   Time 4   Period Weeks   Status Achieved   PT SHORT TERM GOAL #4   Title ambulates 500' without device including grass surfaces, ramps & curbs with supervision. (Target Date: 11/09/2014)   Time 4   Period Weeks   Status Achieved   PT SHORT TERM GOAL #5   Title ambulates 200' without device on grass with supervision while  carrying on conversation. (Target Date: 01/04/2015)   Baseline 01/01/15: 500 feet with supervison on grass (400 feet) and pavement (100 feet) while engaged in conversation, pt doing most of the talking.   Time --   Period --   Status Achieved   PT SHORT TERM GOAL #6   Title Functional Gait Assessment >/= 25/30 (Target Date: 01/04/2015)   Baseline 10/01/14: 24/30 scored   Time --   Period --   Status Not Met           PT Long Term Goals - 12/06/14 1100    PT LONG TERM GOAL #1   Title demonstrates / verbalizes understanding of ongoing HEP / fitness plan. (UPDATED Target Date: 02/01/2015)   Baseline Met: 12/06/14 but with continued PT HEP / fitness plan will need to be progressed further.    Time 8   Period Weeks   Status On-going   PT LONG TERM GOAL #2   Title Cognitive Timed Up & Go <15 sec safely  (Target Date: 12/07/2014)   Baseline Met: 12/06/14 14.77 sec   Time 8   Period Weeks   Status Achieved   PT LONG TERM GOAL #3   Title Berg Balance >/=52/56  (UPDATED Target Date: 02/01/2015)   Baseline NOT MET 12/06/2014 [name] Balance 44/56    Time 8   Period Weeks   Status On-going   PT LONG TERM GOAL #4   Title Functional Gait Assessment >25/ 30  (UPDATED Target Date: 02/01/2015)   Baseline Partially MET 12/06/2014 FGA 23/30   Status On-going   PT LONG TERM GOAL #5   Title ambulates >1000' without device including grass, ramps, curbs, stairs indpendently.  (Target Date: 12/07/2014)   Baseline partially met 12/06/14; pt able to ambulate >1000' without device independently on level surfaces indoors and outdoors, but still requires supervision for safety on grass   Time 8   Period Weeks   Status Partially Met   Additional Long Term Goals   Additional Long Term Goals Yes   PT LONG TERM GOAL #6   Title Cognitive Timed Up & Go <15% increase from standard TUG (UPDATED Target Date: 02/01/2015)   Time 8   Period Weeks   Status New   PT LONG TERM GOAL #7   Title Patient ambulates 200' on grass without device  while carrying on conversation modified independent. (UPDATED Target Date: 02/01/2015)   Time 8   Period Weeks   Status New            Plan - 01/17/15 1410    Clinical Impression Statement Pt continues to be challenged with multi-tasking  balance activities. Improved balance on outdoor compliant surfaces today while engaged in conversation. Pt making steady progress toward goals.   Pt will benefit from skilled therapeutic intervention in order to improve on the following deficits Abnormal gait;Decreased activity tolerance;Decreased balance;Decreased endurance;Decreased strength   Rehab Potential Good   PT Frequency 1x / week   PT Duration 8 weeks   PT Treatment/Interventions ADLs/[facility];DME Instruction;Gait training;Stair training;Functional mobility training;Therapeutic activities;Therapeutic exercise;Balance training;Neuromuscular re-education;Patient/family education   PT Next Visit Plan cont with strengthening and high level balance training   PT Home Exercise Plan hip abduction strengthening for bil. LE's   Consulted and Agree with Plan of Care Patient;Family member/caregiver   Family Member Consulted husband        Problem List Patient Active Problem List   Diagnosis Date Noted  . Monilial vaginitis 10/23/2014  . Deep vein thrombosis 08/12/2014  . History of inferior vena caval filter placement 07/14/2014  . Malignant glioma of brain 06/28/2014  . Diabetes mellitus, type 2 06/27/2014  . 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 Tremor of both hands 04/05/2014  . Vertigo 04/05/2014  . BPPV (benign paroxysmal positional vertigo) 03/14/2014  . Seizures 12/16/2013  . Epigastric pain 11/01/2013  . Fever blister 10/16/2013  . Hypothyroidism 12/13/2012  . Other and unspecified hyperlipidemia 12/13/2012  . Anxiety state, unspecified 12/13/2012  . Edema 12/13/2012  . Anxiety state 12/13/2012  . Adult hypothyroidism 12/13/2012  . Urinary incontinence   .  DEPRESSION 09/24/2007  . Essential hypertension 09/24/2007  . GERD 09/24/2007  . HIATAL HERNIA 09/24/2007  . 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 Headache(784.0) 09/24/2007    Willow Ora 01/18/2015, 7:35 PM  Willow Ora, PTA, [facility] [address] [place], [place] [address] [phone] 01/18/2015, 7:35 PM

## 2015-01-17 NOTE — Therapy (Signed)
[facility] [address] [place], [place], [address] Phone: [phone]   Fax:  [phone]  Occupational Therapy Treatment  Patient Details  Name: Kelly Fletcher MRN: [id number] Date of Birth: Jun 08, 1950 Referring Provider:  Estill Dooms, MD  Encounter Date: 01/17/2015      OT End of Session - 01/17/15 1421    Visit Number 25   Number of Visits 30   Date for OT Re-Evaluation 02/09/15   Authorization Type MCR - G code needed   Authorization Time Period 60 Days   Authorization - Visit Number 25   Authorization - Number of Visits 30   OT Start Time 1320   OT Stop Time 1405   OT Time Calculation (min) 45 min   Equipment Utilized During Treatment adpated spoon   Activity Tolerance Patient tolerated treatment well      Past Medical History  Diagnosis Date  . Uterine prolapse   . Cystocele   . GERD (gastroesophageal reflux disease)   . Elevated cholesterol   . Rectocele   . Hypertension   . Arthritis   . Type II or unspecified type diabetes mellitus without mention of complication, uncontrolled     Type 2  . Urinary incontinence     Urgency  . Anxiety state, unspecified   . Palpitations   . Allergic rhinitis, cause unspecified   . Other and unspecified hyperlipidemia   . Unspecified hypothyroidism   . Edema   . Insomnia, unspecified   . Depressive disorder, not elsewhere classified   . Unspecified urinary incontinence   . Peptic ulcer, unspecified site, unspecified as acute or chronic, without mention of hemorrhage, perforation, or obstruction   . Disturbance of skin sensation   . Calculus of kidney   . Facial spasm 04/05/2014  . Abnormal brain MRI 04/13/2014  . 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 Monilial vaginitis 10/23/2014    Past Surgical History  Procedure Laterality Date  . Tonsillectomy and adenoidectomy  1963  .  Dilation and curettage of uterus      3-05  . Hysteroscopy      3-05  . Endometrial biopsy      Dr.Gottsegen   . Tongue biopsy      There were no vitals filed for this visit.  Visit Diagnosis:  Hemiplegia affecting right dominant side  Lack of coordination  Weakness of right hand      Subjective Assessment - 01/17/15 1321    Subjective  They still can't start the next round of chemo because my platelets are still too low   Patient is accompained by: Family member   Pertinent History glioblastoma with brain surgery to remove tumor 06/28/14, facial seizures   Patient Stated Goals To get my Rt shoulder and hand better b/c I'm Rt handed   Currently in Pain? No/denies                      OT Treatments/Exercises (OP) - 01/17/15 0001    ADLs   Eating Pt practiced eating applesauce Rt dominant hand with large handled spoon and Rt angled large handled spoon. Pt had increased ease and less spills with Rt angled spoon.    ADL Comments Pt issued handouts on various options for Rt angled spoons and told how/where to purchase   Fine Motor Coordination   Other Fine Motor Exercises Pt  practiced removing clothespins from antenna and replacing with compensations, yellow to red resistance, for coordination, control and pinch strength                  OT Short Term Goals - 01/08/15 1315    OT SHORT TERM GOAL #2   Title Pt to verbalize understanding with pain management strategies RUE (ongoing for renewal period - due 01/10/15)   Status Achieved   OT SHORT TERM GOAL #3   Title Pt to write name at 75% or greater legibility w/ A/E for pen prn Rt dominant hand (due 01/10/15)   Baseline approx. 25% first name only with built up pen   Status Achieved   OT SHORT TERM GOAL #4   Title Improve RUE functional use as evidenced by performing 20 blocks or greater on Box & Blocks test   Baseline eval = 17, 12/04/14 = 12, 12/27/14 = 12   Status Not Met   OT SHORT TERM GOAL #5   Title  Pt to improve grip strength Rt hand to 15 lbs or greater to decrease drops   Baseline eval = 10 lbs (Lt = 55 lbs); 12/04/14 = 10 lbs, 12/27/14 = 10 lbs   Status Not Met   OT SHORT TERM GOAL #7   Title Pt to use Rt dominant hand to feed [place] with finger foods 50% of the time or greater   Baseline none with Rt hand, using Lt non dominant hand   Status Achieved           OT Long Term Goals - 01/08/15 1315    OT LONG TERM GOAL #1   Title Independent with updated HEP for RUE strength (DUE 02/09/15)   Status On-going   OT LONG TERM GOAL #2   Title Pt to perform high level reaching RUE to retrieve/replace light weight object from high shelf 5 times w/o rest and min compensations   Baseline only 75% shoulder flexion   Status Revised   OT LONG TERM GOAL #3   Title Improve RUE functional use as evidenced by performing 30 blocks on Box & Blocks test    Status On-going   OT LONG TERM GOAL #4   Title Improve coordination as evidenced by performing 9 hole peg test in under  2 min.    Baseline eval: only able to place 1 peg in 60 sec.    Status On-going   OT LONG TERM GOAL #5   Title Pt to improve grip strength Rt dominant hand to 20 lbs or greater to open jars/containers   Baseline eval = 10 lbs, 12/04/14 = 10 lbs   Status On-going   OT LONG TERM GOAL #6   Title Pt to return to using Rt hand as dominant hand for all BADLS 06% of the time or greater   Baseline only using LUE   Status On-going   OT LONG TERM GOAL #7   Title Pt to write sentence with Rt dominant hand using A/E prn with 75% legibility    Baseline 25% legibility for first name only   Status On-going   OT LONG TERM GOAL #8   Title Pt to return to light cooking/cleaning tasks with min assist with A/E and/or DME prn   Baseline dependent   Status On-going               Plan - 01/17/15 1421    Clinical Impression Statement Pt with increased ability to feed Sacra with less spills  using Rt angled adapted spoon. Pt with  increased control and pinch strength with compensations required.    Plan continue functional use of Rt hand, writing?, UBE with Rt hand wrapped for reciprocal movement   Consulted and Agree with Plan of Care Patient;Family member/caregiver   Family Member Consulted HUSBAND        Problem List Patient Active Problem List   Diagnosis Date Noted  . Monilial vaginitis 10/23/2014  . Deep vein thrombosis 08/12/2014  . History of inferior vena caval filter placement 07/14/2014  . Malignant glioma of brain 06/28/2014  . Diabetes mellitus, type 2 06/27/2014  . 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 Tremor of both hands 04/05/2014  . Vertigo 04/05/2014  . BPPV (benign paroxysmal positional vertigo) 03/14/2014  . Seizures 12/16/2013  . Epigastric pain 11/01/2013  . Fever blister 10/16/2013  . Hypothyroidism 12/13/2012  . Other and unspecified hyperlipidemia 12/13/2012  . Anxiety state, unspecified 12/13/2012  . Edema 12/13/2012  . Anxiety state 12/13/2012  . Adult hypothyroidism 12/13/2012  . Urinary incontinence   . DEPRESSION 09/24/2007  . Essential hypertension 09/24/2007  . GERD 09/24/2007  . HIATAL HERNIA 09/24/2007  . 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 Headache(784.0) 09/24/2007    Carey Bullocks, OTR/L 01/17/2015, 2:24 PM  [facility] [address] [place], [place], [address] Phone: [phone]   Fax:  [phone]

## 2015-01-22 ENCOUNTER — Ambulatory Visit: Payer: Medicare Other | Admitting: Occupational Therapy

## 2015-01-22 ENCOUNTER — Encounter: Payer: Self-pay | Admitting: Occupational Therapy

## 2015-01-22 ENCOUNTER — Ambulatory Visit: Payer: Medicare Other | Admitting: Physical Therapy

## 2015-01-22 ENCOUNTER — Encounter: Payer: Self-pay | Admitting: Physical Therapy

## 2015-01-22 VITALS — BP 134/71

## 2015-01-22 DIAGNOSIS — R269 Unspecified abnormalities of gait and mobility: Secondary | ICD-10-CM

## 2015-01-22 DIAGNOSIS — R2689 Other abnormalities of gait and mobility: Secondary | ICD-10-CM

## 2015-01-22 DIAGNOSIS — R6889 Other general symptoms and signs: Secondary | ICD-10-CM

## 2015-01-22 DIAGNOSIS — G8191 Hemiplegia, unspecified affecting right dominant side: Secondary | ICD-10-CM | POA: Diagnosis not present

## 2015-01-22 DIAGNOSIS — R4189 Other symptoms and signs involving cognitive functions and awareness: Secondary | ICD-10-CM

## 2015-01-22 DIAGNOSIS — R29898 Other symptoms and signs involving the musculoskeletal system: Secondary | ICD-10-CM

## 2015-01-22 DIAGNOSIS — R279 Unspecified lack of coordination: Secondary | ICD-10-CM

## 2015-01-22 DIAGNOSIS — R2681 Unsteadiness on feet: Secondary | ICD-10-CM

## 2015-01-22 NOTE — Therapy (Signed)
[facility] [address] [place], [place], [address] Phone: [phone]   Fax:  [phone]  Physical Therapy Treatment  Patient Details  Name: Kelly Fletcher MRN: [id number] Date of Birth: 23-Mar-1951 Referring Provider:  Estill Dooms, MD  Encounter Date: 01/22/2015      PT End of Session - 01/22/15 1100    Visit Number 20   Number of Visits 22   Date for PT Re-Evaluation 02/05/15   Authorization Type G-code & KX modifier   PT Start Time 1100   PT Stop Time 1145   PT Time Calculation (min) 45 min   Equipment Utilized During Treatment Gait belt   Activity Tolerance Patient tolerated treatment well   Behavior During Therapy [facility] for tasks assessed/performed      Past Medical History  Diagnosis Date  . Uterine prolapse   . Cystocele   . GERD (gastroesophageal reflux disease)   . Elevated cholesterol   . Rectocele   . Hypertension   . Arthritis   . Type II or unspecified type diabetes mellitus without mention of complication, uncontrolled     Type 2  . Urinary incontinence     Urgency  . Anxiety state, unspecified   . Palpitations   . Allergic rhinitis, cause unspecified   . Other and unspecified hyperlipidemia   . Unspecified hypothyroidism   . Edema   . Insomnia, unspecified   . Depressive disorder, not elsewhere classified   . Unspecified urinary incontinence   . Peptic ulcer, unspecified site, unspecified as acute or chronic, without mention of hemorrhage, perforation, or obstruction   . Disturbance of skin sensation   . Calculus of kidney   . Facial spasm 04/05/2014  . Abnormal brain MRI 04/13/2014  . 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 Monilial vaginitis 10/23/2014    Past Surgical History  Procedure Laterality Date  . Tonsillectomy and adenoidectomy  1963  . Dilation and curettage of uterus      3-05  . Hysteroscopy       3-05  . Endometrial biopsy      Dr.Gottsegen   . Tongue biopsy      There were no vitals filed for this visit.  Visit Diagnosis:  Balance problems  Abnormality of gait  Decreased functional activity tolerance  Unsteadiness      Subjective Assessment - 01/22/15 1106    Subjective Waiting for platlets in blood to come up before restarting chemo. Her walking seems bettter.   Currently in Pain? No/denies            [facility] PT Assessment - 01/22/15 1100    Functional Gait  Assessment   Gait Level Surface Walks 20 ft in less than 7 sec but greater than 5.5 sec, uses assistive device, slower speed, mild gait deviations, or deviates 6-10 in outside of the 12 in walkway width.   Change in Gait Speed Able to smoothly change walking speed without loss of balance or gait deviation. Deviate no more than 6 in outside of the 12 in walkway width.   Gait with Horizontal Head Turns Performs head turns smoothly with no change in gait. Deviates no more than 6 in outside 12 in walkway width   Gait with Vertical Head Turns Performs head turns with no change in gait. Deviates no more than 6 in outside 12 in walkway width.   Gait and  Pivot Turn Pivot turns safely within 3 sec and stops quickly with no loss of balance.   Step Over Obstacle Is able to step over 2 stacked shoe boxes taped together (9 in total height) without changing gait speed. No evidence of imbalance.   Gait with Narrow Base of Support Ambulates 4-7 steps.   Gait with Eyes Closed Walks 20 ft, uses assistive device, slower speed, mild gait deviations, deviates 6-10 in outside 12 in walkway width. Ambulates 20 ft in less than 9 sec but greater than 7 sec.   Ambulating Backwards Walks 20 ft, no assistive devices, good speed, no evidence for imbalance, normal gait   Steps Alternating feet, must use rail.   Total Score 25     Neuromuscular Re-education: See patient education                        PT Education -  01/22/15 1100    Education provided Yes   Education Details update exercises of modified stance with one leg on 4-6" stool & stance limb hip abduction / pelvic motion, counter exercises of alternate stepping to right & left with LE rotation with weight shift between LEs, stepping off to right & left then walking that direction, walking / turning 180* / walk other direction with proper foot motion,  standing up without UE assist, initiating gait without hesitancy, walking 5-10' then turning 180* to return to chair,    Person(s) Educated Patient;Spouse   Methods Explanation;Demonstration;Tactile cues;Verbal cues;Other (comment)  husband video taped patient performing exercises   Comprehension Verbalized understanding;Returned demonstration;Verbal cues required;Tactile cues required;Need further instruction          PT Short Term Goals - 01/01/15 1453    PT SHORT TERM GOAL #1   Title patient correcty demonstrates initial HEP with husband cueing. (Target Date: 11/09/2014)   Time 4   Period Weeks   Status Achieved   PT SHORT TERM GOAL #2   Title Timed Up & Go <13.5 sec without device (Target Date: 11/09/2014)  Current time is 8.13 seconds (11/08/14)   Time 4   Period Weeks   Status Achieved   PT SHORT TERM GOAL #3   Title Berg Balance >46/56 (Target Date: 11/09/2014)  Current [name] is 54/56 (11/08/14)   Time 4   Period Weeks   Status Achieved   PT SHORT TERM GOAL #4   Title ambulates 500' without device including grass surfaces, ramps & curbs with supervision. (Target Date: 11/09/2014)   Time 4   Period Weeks   Status Achieved   PT SHORT TERM GOAL #5   Title ambulates 200' without device on grass with supervision while carrying on conversation. (Target Date: 01/04/2015)   Baseline 01/01/15: 500 feet with supervison on grass (400 feet) and pavement (100 feet) while engaged in conversation, pt doing most of the talking.   Time --   Period --   Status Achieved   PT SHORT TERM GOAL #6   Title  Functional Gait Assessment >/= 25/30 (Target Date: 01/04/2015)   Baseline 10/01/14: 24/30 scored   Time --   Period --   Status Not Met           PT Long Term Goals - 12/06/14 1100    PT LONG TERM GOAL #1   Title demonstrates / verbalizes understanding of ongoing HEP / fitness plan. (UPDATED Target Date: 02/01/2015)   Baseline Met: 12/06/14 but with continued PT HEP / fitness plan will need  to be progressed further.    Time 8   Period Weeks   Status On-going   PT LONG TERM GOAL #2   Title Cognitive Timed Up & Go <15 sec safely  (Target Date: 12/07/2014)   Baseline Met: 12/06/14 14.77 sec   Time 8   Period Weeks   Status Achieved   PT LONG TERM GOAL #3   Title Berg Balance >/=52/56  (UPDATED Target Date: 02/01/2015)   Baseline NOT MET 12/06/2014 [name] Balance 44/56    Time 8   Period Weeks   Status On-going   PT LONG TERM GOAL #4   Title Functional Gait Assessment >25/ 30  (UPDATED Target Date: 02/01/2015)   Baseline Partially MET 12/06/2014 FGA 23/30   Status On-going   PT LONG TERM GOAL #5   Title ambulates >1000' without device including grass, ramps, curbs, stairs indpendently.  (Target Date: 12/07/2014)   Baseline partially met 12/06/14; pt able to ambulate >1000' without device independently on level surfaces indoors and outdoors, but still requires supervision for safety on grass   Time 8   Period Weeks   Status Partially Met   Additional Long Term Goals   Additional Long Term Goals Yes   PT LONG TERM GOAL #6   Title Cognitive Timed Up & Go <15% increase from standard TUG (UPDATED Target Date: 02/01/2015)   Time 8   Period Weeks   Status New   PT LONG TERM GOAL #7   Title Patient ambulates 200' on grass without device while carrying on conversation modified independent. (UPDATED Target Date: 02/01/2015)   Time 8   Period Weeks   Status New               Plan - 01-25-15 1100    Clinical Impression Statement Patient was able to improve ability to change direction of movements  without balance losses with instruction & repetition. Patient's gait appears more fluent with less balance losses while multi-tasking.   Pt will benefit from skilled therapeutic intervention in order to improve on the following deficits Abnormal gait;Decreased activity tolerance;Decreased balance;Decreased endurance;Decreased strength   Rehab Potential Good   PT Frequency 1x / week   PT Duration 8 weeks   PT Treatment/Interventions ADLs/[facility];DME Instruction;Gait training;Stair training;Functional mobility training;Therapeutic activities;Therapeutic exercise;Balance training;Neuromuscular re-education;Patient/family education   PT Next Visit Plan Check all exercises and consolidate to current level of function, cont with strengthening and high level balance training   Consulted and Agree with Plan of Care Patient;Family member/caregiver   Family Member Consulted husband          G-Codes - 25-Jan-2015 1100    Functional Assessment Tool Used Functional Gait Assessment 25/30   Functional Limitation Mobility: Walking and moving around   Mobility: Walking and Moving Around Current Status [phone]) At least 20 percent but less than 40 percent impaired, limited or restricted   Mobility: Walking and Moving Around Goal Status [phone]) At least 20 percent but less than 40 percent impaired, limited or restricted      Problem List Patient Active Problem List   Diagnosis Date Noted  . Monilial vaginitis 10/23/2014  . Deep vein thrombosis 08/12/2014  . History of inferior vena caval filter placement 07/14/2014  . Malignant glioma of brain 06/28/2014  . Diabetes mellitus, type 2 06/27/2014  . 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 Tremor of both hands 04/05/2014  . Vertigo 04/05/2014  . BPPV (benign paroxysmal positional vertigo) 03/14/2014  . Seizures 12/16/2013  .  Epigastric pain 11/01/2013  . Fever blister 10/16/2013  . Hypothyroidism 12/13/2012  . Other and unspecified  hyperlipidemia 12/13/2012  . Anxiety state, unspecified 12/13/2012  . Edema 12/13/2012  . Anxiety state 12/13/2012  . Adult hypothyroidism 12/13/2012  . Urinary incontinence   . DEPRESSION 09/24/2007  . Essential hypertension 09/24/2007  . GERD 09/24/2007  . HIATAL HERNIA 09/24/2007  . 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 QXIHWTUU(828.0) 09/24/2007    Charmion Hapke PT, DPT 01/22/2015, 8:51 PM  [facility] [address] [place], [place], [address] Phone: [phone]   Fax:  [phone]

## 2015-01-22 NOTE — Therapy (Signed)
[facility] [address] [place], [place], [address] Phone: [phone]   Fax:  [phone]  Occupational Therapy Treatment  Patient Details  Name: Kelly Fletcher: [id number] Date of Birth: 20-Jul-1950 Referring Provider:  Estill Dooms, MD  Encounter Date: 01/22/2015      OT End of Session - 01/22/15 1320    Visit Number 26   Number of Visits 30   Date for OT Re-Evaluation 02/09/15   Authorization Type MCR - G code needed   OT Start Time 1147   OT Stop Time 1231   OT Time Calculation (min) 44 min   Activity Tolerance Patient tolerated treatment well      Past Medical History  Diagnosis Date  . Uterine prolapse   . Cystocele   . GERD (gastroesophageal reflux disease)   . Elevated cholesterol   . Rectocele   . Hypertension   . Arthritis   . Type II or unspecified type diabetes mellitus without mention of complication, uncontrolled     Type 2  . Urinary incontinence     Urgency  . Anxiety state, unspecified   . Palpitations   . Allergic rhinitis, cause unspecified   . Other and unspecified hyperlipidemia   . Unspecified hypothyroidism   . Edema   . Insomnia, unspecified   . Depressive disorder, not elsewhere classified   . Unspecified urinary incontinence   . Peptic ulcer, unspecified site, unspecified as acute or chronic, without mention of hemorrhage, perforation, or obstruction   . Disturbance of skin sensation   . Calculus of kidney   . Facial spasm 04/05/2014  . Abnormal brain MRI 04/13/2014  . 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 Monilial vaginitis 10/23/2014    Past Surgical History  Procedure Laterality Date  . Tonsillectomy and adenoidectomy  1963  . Dilation and curettage of uterus      3-05  . Hysteroscopy      3-05  . Endometrial biopsy      Dr.Gottsegen   . Tongue biopsy      Filed Vitals:   01/22/15  1153  BP: 134/71    Visit Diagnosis:  Hemiplegia affecting right dominant side  Lack of coordination  Weakness of right hand  Cognitive deficits      Subjective Assessment - 01/22/15 1149    Subjective  They can't do chemo yet because my blood counts are too low   Patient is accompained by: Family member  husband   Pertinent History glioblastoma with brain surgery to remove tumor 06/28/14, facial seizures   Currently in Pain? No/denies                      OT Treatments/Exercises (OP) - 01/22/15 0001    ADLs   Writing Practiced writing name with built up marker with emphasis on using vision to assist with proper orientation of hand when attemtping to write as well as motor planning for writing of familiar words. Pt becomes easily frustrated however with encouragement and max vc's pt is able to print name legibily using built up marker with large letters.    Neurological Re-education Exercises   Other Exercises 1 Neuro re ed to address functional reach incorporating ability to pick up and release varying shaped and sized objects. Pt able to achieve 83* of shoulder flexion with only vc's to decrease compensations for unilateral  reach.  Pt had no pain or discomfort in shoulder with activity.                  OT Short Term Goals - 01/22/15 1315    OT SHORT TERM GOAL #2   Title Pt to verbalize understanding with pain management strategies RUE (ongoing for renewal period - due 01/10/15)   Status Achieved   OT SHORT TERM GOAL #3   Title Pt to write name at 75% or greater legibility w/ A/E for pen prn Rt dominant hand (due 01/10/15)   Baseline approx. 25% first name only with built up pen   Status Achieved   OT SHORT TERM GOAL #4   Title Improve RUE functional use as evidenced by performing 20 blocks or greater on Box & Blocks test   Baseline eval = 17, 12/04/14 = 12, 12/27/14 = 12   Status Not Met   OT SHORT TERM GOAL #5   Title Pt to improve grip strength Rt  hand to 15 lbs or greater to decrease drops   Baseline eval = 10 lbs (Lt = 55 lbs); 12/04/14 = 10 lbs, 12/27/14 = 10 lbs   Status Not Met   OT SHORT TERM GOAL #7   Title Pt to use Rt dominant hand to feed [place] with finger foods 50% of the time or greater   Baseline none with Rt hand, using Lt non dominant hand   Status Achieved           OT Long Term Goals - 01/22/15 1315    OT LONG TERM GOAL #1   Title Independent with updated HEP for RUE strength (DUE 02/09/15)   Status On-going   OT LONG TERM GOAL #2   Title Pt to perform 90* of reaching RUE to retrieve/replace light weight object from high shelf 5 times w/o rest and min compensations   Baseline only 75% shoulder flexion   Status Revised   OT LONG TERM GOAL #3   Title Improve RUE functional use as evidenced by performing 18 blocks on Box & Blocks test    Status Revised   OT LONG TERM GOAL #4   Title Improve coordination as evidenced by performing 9 hole peg test in under  2 min.    Baseline eval: only able to place 1 peg in 60 sec.    Status On-going   OT LONG TERM GOAL #5   Title Pt to improve grip strength Rt dominant hand to 15 lbs or greater to open jars/containers   Baseline eval = 10 lbs, 12/04/14 = 10 lbs   Status Revised   OT LONG TERM GOAL #6   Title Pt to return to using Rt hand as dominant hand for all BADLS 77% of the time or greater   Baseline only using LUE   Status Achieved   OT LONG TERM GOAL #7   Title Pt to write first and last name with Rt dominant hand using A/E prn with 75% legibility    Baseline 25% legibility for first name only   Status Revised   OT LONG TERM GOAL #8   Title Pt to return to light cooking/cleaning tasks with min assist with A/E and/or DME prn   Baseline dependent   Status On-going               Plan - 01/22/15 1318    Clinical Impression Statement Pt with improved functional reach without pain and with functional grasp and release.  Pt can become easily frustrated however  responds to encouragement and concrete measures of improvement. Pt and husband stated today they are very pleased with how wel the pt can now reach with her RUE.    Pt will benefit from skilled therapeutic intervention in order to improve on the following deficits (Retired) Decreased cognition;Decreased knowledge of use of DME;Impaired flexibility;Pain;Decreased coordination;Decreased mobility;Impaired sensation;Improper body mechanics;Decreased activity tolerance;Decreased endurance;Decreased range of motion;Decreased strength;Impaired tone;Decreased balance;Decreased knowledge of precautions;Decreased safety awareness;Impaired UE functional use;Impaired perceived functional ability   Rehab Potential Good   Clinical Impairments Affecting Rehab Potential pain RUE, apraxia, impaired sensation, impaired cogntion   OT Frequency 2x / week   OT Duration 8 weeks   OT Treatment/Interventions [place]-care/ADL training;Electrical Stimulation;Therapeutic exercise;Cognitive remediation/compensation;Moist Heat;Parrafin;Neuromuscular education;Splinting;Visual/perceptual remediation/compensation;Fluidtherapy;Energy conservation;Therapist, nutritional;Therapeutic exercises;Patient/family education;DME and/or AE instruction;Manual Therapy;Passive range of motion;Therapeutic activities   Plan writing, neuro re ed for reach and functional use of the RUE   Consulted and Agree with Plan of Care Patient;Family member/caregiver   Family Member Consulted HUSBAND        Problem List Patient Active Problem List   Diagnosis Date Noted  . Monilial vaginitis 10/23/2014  . Deep vein thrombosis 08/12/2014  . History of inferior vena caval filter placement 07/14/2014  . Malignant glioma of brain 06/28/2014  . Diabetes mellitus, type 2 06/27/2014  . 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 Tremor of both hands 04/05/2014  . Vertigo 04/05/2014  . BPPV (benign paroxysmal positional vertigo) 03/14/2014  . Seizures  12/16/2013  . Epigastric pain 11/01/2013  . Fever blister 10/16/2013  . Hypothyroidism 12/13/2012  . Other and unspecified hyperlipidemia 12/13/2012  . Anxiety state, unspecified 12/13/2012  . Edema 12/13/2012  . Anxiety state 12/13/2012  . Adult hypothyroidism 12/13/2012  . Urinary incontinence   . DEPRESSION 09/24/2007  . Essential hypertension 09/24/2007  . GERD 09/24/2007  . HIATAL HERNIA 09/24/2007  . 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 Headache(784.0) 09/24/2007    Quay Burow, OTR/L 01/22/2015, 1:21 PM  [facility] [address] [place], [place], [address] Phone: [phone]   Fax:  [phone]

## 2015-01-23 ENCOUNTER — Ambulatory Visit: Payer: Medicare Other

## 2015-01-23 ENCOUNTER — Other Ambulatory Visit (HOSPITAL_BASED_OUTPATIENT_CLINIC_OR_DEPARTMENT_OTHER): Payer: Medicare Other

## 2015-01-23 DIAGNOSIS — C711 Malignant neoplasm of frontal lobe: Secondary | ICD-10-CM | POA: Diagnosis present

## 2015-01-23 DIAGNOSIS — C719 Malignant neoplasm of brain, unspecified: Secondary | ICD-10-CM

## 2015-01-23 LAB — CBC WITH DIFFERENTIAL/PLATELET
BASO%: 0.4 % (ref 0.0–2.0)
BASOS ABS: 0 10*3/uL (ref 0.0–0.1)
EOS%: 2.5 % (ref 0.0–7.0)
Eosinophils Absolute: 0.1 10*3/uL (ref 0.0–0.5)
HEMATOCRIT: 34.5 % — AB (ref 34.8–46.6)
HEMOGLOBIN: 11.7 g/dL (ref 11.6–15.9)
LYMPH#: 0.9 10*3/uL (ref 0.9–3.3)
LYMPH%: 20.9 % (ref 14.0–49.7)
MCH: 33.1 pg (ref 25.1–34.0)
MCHC: 33.9 g/dL (ref 31.5–36.0)
MCV: 97.7 fL (ref 79.5–101.0)
MONO#: 0.2 10*3/uL (ref 0.1–0.9)
MONO%: 4.5 % (ref 0.0–14.0)
NEUT#: 3.2 10*3/uL (ref 1.5–6.5)
NEUT%: 71.7 % (ref 38.4–76.8)
PLATELETS: 98 10*3/uL — AB (ref 145–400)
RBC: 3.53 10*6/uL — ABNORMAL LOW (ref 3.70–5.45)
RDW: 14.9 % — AB (ref 11.2–14.5)
WBC: 4.5 10*3/uL (ref 3.9–10.3)

## 2015-01-23 NOTE — Progress Notes (Signed)
Per Dr. Benay Spice, no plt transfusion needed today.  Pt told to hold Temodar until otherwise notified by Dr. Benay Spice after discussing with [facility] PA.  Pt informed of plan and in agreement.  No further questions and concerns at this time.

## 2015-01-24 ENCOUNTER — Encounter: Payer: Self-pay | Admitting: Occupational Therapy

## 2015-01-24 ENCOUNTER — Ambulatory Visit: Payer: Medicare Other | Admitting: Occupational Therapy

## 2015-01-24 DIAGNOSIS — G8191 Hemiplegia, unspecified affecting right dominant side: Secondary | ICD-10-CM | POA: Diagnosis not present

## 2015-01-24 DIAGNOSIS — R4189 Other symptoms and signs involving cognitive functions and awareness: Secondary | ICD-10-CM

## 2015-01-24 DIAGNOSIS — R279 Unspecified lack of coordination: Secondary | ICD-10-CM

## 2015-01-24 DIAGNOSIS — R29898 Other symptoms and signs involving the musculoskeletal system: Secondary | ICD-10-CM

## 2015-01-24 NOTE — Therapy (Signed)
[facility] [address] [place], [place], [address] Phone: [phone]   Fax:  [phone]  Occupational Therapy Treatment  Patient Details  Name: Kelly Fletcher MRN: [id number] Date of Birth: 1950/12/06 Referring Provider:  Estill Dooms, MD  Encounter Date: 01/24/2015      OT End of Session - 01/24/15 1503    Visit Number 27   Number of Visits 30   Date for OT Re-Evaluation 02/09/15   Authorization Type MCR - G code needed   OT Start Time 0867   OT Stop Time 1400   OT Time Calculation (min) 43 min   Activity Tolerance Patient tolerated treatment well      Past Medical History  Diagnosis Date  . Uterine prolapse   . Cystocele   . GERD (gastroesophageal reflux disease)   . Elevated cholesterol   . Rectocele   . Hypertension   . Arthritis   . Type II or unspecified type diabetes mellitus without mention of complication, uncontrolled     Type 2  . Urinary incontinence     Urgency  . Anxiety state, unspecified   . Palpitations   . Allergic rhinitis, cause unspecified   . Other and unspecified hyperlipidemia   . Unspecified hypothyroidism   . Edema   . Insomnia, unspecified   . Depressive disorder, not elsewhere classified   . Unspecified urinary incontinence   . Peptic ulcer, unspecified site, unspecified as acute or chronic, without mention of hemorrhage, perforation, or obstruction   . Disturbance of skin sensation   . Calculus of kidney   . Facial spasm 04/05/2014  . Abnormal brain MRI 04/13/2014  . 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 Monilial vaginitis 10/23/2014    Past Surgical History  Procedure Laterality Date  . Tonsillectomy and adenoidectomy  1963  . Dilation and curettage of uterus      3-05  . Hysteroscopy      3-05  . Endometrial biopsy      Dr.Gottsegen   . Tongue biopsy      There were no vitals filed  for this visit.  Visit Diagnosis:  Hemiplegia affecting right dominant side  Lack of coordination  Weakness of right hand  Cognitive deficits      Subjective Assessment - 01/24/15 1322    Subjective  My numbers are coming up but still not high enough for chemo   Patient is accompained by: Family member  husband   Pertinent History glioblastoma with brain surgery to remove tumor 06/28/14, facial seizures   Patient Stated Goals To get my Rt shoulder and hand better b/c I'm Rt handed   Currently in Pain? No/denies                      OT Treatments/Exercises (OP) - 01/24/15 0001    Neurological Re-education Exercises   Other Exercises 1 Neuro re ed to work on high reach in weight bearing using UE ranger against wall. Addressed shoulder flexion to approximately 145* shoulder flexion without pain - needed min faciliation initially but progressed to  only vc's. Also addressed overhead reach out of midling to about 30* and pt able to maintain control after repetition and practice without assist.  Also addressed functional reach into cabinets reaching for glass coffee mug. Due to apraxia , poor sensation and cognitive deficits pt requires significant repetition. Pt  has AROM that would allow for correct hand orientation but has great dififculty with this, Practiced pouring in several positions to address isolated alternating wrist movement with functional grasp and in context of activity.  Pt given this as HEP and pt and husband able to return demonstrate.                    OT Short Term Goals - 01/24/15 1500    OT SHORT TERM GOAL #2   Title Pt to verbalize understanding with pain management strategies RUE (ongoing for renewal period - due 01/10/15)   Status Achieved   OT SHORT TERM GOAL #3   Title Pt to write name at 75% or greater legibility w/ A/E for pen prn Rt dominant hand (due 01/10/15)   Baseline approx. 25% first name only with built up pen   Status Achieved    OT SHORT TERM GOAL #4   Title Improve RUE functional use as evidenced by performing 20 blocks or greater on Box & Blocks test   Baseline eval = 17, 12/04/14 = 12, 12/27/14 = 12   Status Not Met   OT SHORT TERM GOAL #5   Title Pt to improve grip strength Rt hand to 15 lbs or greater to decrease drops   Baseline eval = 10 lbs (Lt = 55 lbs); 12/04/14 = 10 lbs, 12/27/14 = 10 lbs   Status Not Met   OT SHORT TERM GOAL #7   Title Pt to use Rt dominant hand to feed Kelly Fletcher with finger foods 50% of the time or greater   Baseline none with Rt hand, using Lt non dominant hand   Status Achieved           OT Long Term Goals - 01/24/15 1501    OT LONG TERM GOAL #1   Title Independent with updated HEP for RUE strength (DUE 02/09/15)   Status On-going   OT LONG TERM GOAL #2   Title Pt to perform 90* of reaching RUE to retrieve/replace light weight object from high shelf 5 times w/o rest and min compensations   Baseline only 75% shoulder flexion   Status On-going   OT LONG TERM GOAL #3   Title Improve RUE functional use as evidenced by performing 18 blocks on Box & Blocks test    Status On-going   OT LONG TERM GOAL #4   Title Improve coordination as evidenced by performing 9 hole peg test in under  2 min.    Baseline eval: only able to place 1 peg in 60 sec.    Status On-going   OT LONG TERM GOAL #5   Title Pt to improve grip strength Rt dominant hand to 15 lbs or greater to open jars/containers   Baseline eval = 10 lbs, 12/04/14 = 10 lbs   Status On-going   OT LONG TERM GOAL #6   Title Pt to return to using Rt hand as dominant hand for all BADLS 22% of the time or greater   Baseline only using LUE   Status Achieved   OT LONG TERM GOAL #7   Title Pt to write first and last name with Rt dominant hand using A/E prn with 75% legibility    Baseline 25% legibility for first name only   Status On-going   OT LONG TERM GOAL #8   Title Pt to return to light cooking/cleaning tasks with min assist with A/E  and/or DME prn   Baseline dependent   Status  On-going               Plan - 01/24/15 1501    Clinical Impression Statement Pt continues to make good progress however requires significant practice and repetition for mastery. Pt continues to report increased use of hand at home and is very motivated to use RUE functionally   Pt will benefit from skilled therapeutic intervention in order to improve on the following deficits (Retired) Decreased cognition;Decreased knowledge of use of DME;Impaired flexibility;Pain;Decreased coordination;Decreased mobility;Impaired sensation;Improper body mechanics;Decreased activity tolerance;Decreased endurance;Decreased range of motion;Decreased strength;Impaired tone;Decreased balance;Decreased knowledge of precautions;Decreased safety awareness;Impaired UE functional use;Impaired perceived functional ability   Rehab Potential Good   Clinical Impairments Affecting Rehab Potential apraxia, impaired sensation, impaired cogntion   OT Frequency 2x / week   OT Duration 8 weeks   OT Treatment/Interventions [place]-care/ADL training;Electrical Stimulation;Therapeutic exercise;Cognitive remediation/compensation;Moist Heat;Parrafin;Neuromuscular education;Splinting;Visual/perceptual remediation/compensation;Fluidtherapy;Energy conservation;Therapist, nutritional;Therapeutic exercises;Patient/family education;DME and/or AE instruction;Manual Therapy;Passive range of motion;Therapeutic activities   Plan mid to high level reach, NMR for appropriate hand orientation, as well as functional use of hand   Consulted and Agree with Plan of Care Patient;Family member/caregiver   Family Member Consulted HUSBAND        Problem List Patient Active Problem List   Diagnosis Date Noted  . Monilial vaginitis 10/23/2014  . Deep vein thrombosis 08/12/2014  . History of inferior vena caval filter placement 07/14/2014  . Malignant glioma of brain 06/28/2014  . Diabetes  mellitus, type 2 06/27/2014  . 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 Tremor of both hands 04/05/2014  . Vertigo 04/05/2014  . BPPV (benign paroxysmal positional vertigo) 03/14/2014  . Seizures 12/16/2013  . Epigastric pain 11/01/2013  . Fever blister 10/16/2013  . Hypothyroidism 12/13/2012  . Other and unspecified hyperlipidemia 12/13/2012  . Anxiety state, unspecified 12/13/2012  . Edema 12/13/2012  . Anxiety state 12/13/2012  . Adult hypothyroidism 12/13/2012  . Urinary incontinence   . DEPRESSION 09/24/2007  . Essential hypertension 09/24/2007  . GERD 09/24/2007  . HIATAL HERNIA 09/24/2007  . 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 Headache(784.0) 09/24/2007    Quay Burow, OTR/L 01/24/2015, 3:05 PM  [facility] [address] [place], [place], [address] Phone: [phone]   Fax:  [phone]

## 2015-01-24 NOTE — Patient Instructions (Signed)
Pt given pouring activity

## 2015-01-28 ENCOUNTER — Ambulatory Visit: Payer: Medicare Other | Admitting: Occupational Therapy

## 2015-01-28 ENCOUNTER — Encounter: Payer: Self-pay | Admitting: Occupational Therapy

## 2015-01-28 DIAGNOSIS — R29898 Other symptoms and signs involving the musculoskeletal system: Secondary | ICD-10-CM

## 2015-01-28 DIAGNOSIS — R4189 Other symptoms and signs involving cognitive functions and awareness: Secondary | ICD-10-CM

## 2015-01-28 DIAGNOSIS — G8191 Hemiplegia, unspecified affecting right dominant side: Secondary | ICD-10-CM

## 2015-01-28 DIAGNOSIS — R279 Unspecified lack of coordination: Secondary | ICD-10-CM

## 2015-01-28 NOTE — Therapy (Signed)
[facility] [address] [place], [place], [address] Phone: [phone]   Fax:  [phone]  Occupational Therapy Treatment  Patient Details  Name: Kelly Fletcher MRN: [id number] Date of Birth: 08/24/1950 Referring Provider:  Estill Dooms, MD  Encounter Date: 01/28/2015      OT End of Session - 01/28/15 1306    Visit Number 28   Number of Visits 30   Date for OT Re-Evaluation 02/09/15   Authorization Type MCR - G code needed   Authorization Time Period 78 Days   Authorization - Visit Number 28   Authorization - Number of Visits 30   OT Start Time 1146   OT Stop Time 1232   OT Time Calculation (min) 46 min   Activity Tolerance Patient tolerated treatment well      Past Medical History  Diagnosis Date  . Uterine prolapse   . Cystocele   . GERD (gastroesophageal reflux disease)   . Elevated cholesterol   . Rectocele   . Hypertension   . Arthritis   . Type II or unspecified type diabetes mellitus without mention of complication, uncontrolled     Type 2  . Urinary incontinence     Urgency  . Anxiety state, unspecified   . Palpitations   . Allergic rhinitis, cause unspecified   . Other and unspecified hyperlipidemia   . Unspecified hypothyroidism   . Edema   . Insomnia, unspecified   . Depressive disorder, not elsewhere classified   . Unspecified urinary incontinence   . Peptic ulcer, unspecified site, unspecified as acute or chronic, without mention of hemorrhage, perforation, or obstruction   . Disturbance of skin sensation   . Calculus of kidney   . Facial spasm 04/05/2014  . Abnormal brain MRI 04/13/2014  . 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 Monilial vaginitis 10/23/2014    Past Surgical History  Procedure Laterality Date  . Tonsillectomy and adenoidectomy  1963  . Dilation and curettage of uterus      3-05  .  Hysteroscopy      3-05  . Endometrial biopsy      Dr.Gottsegen   . Tongue biopsy      There were no vitals filed for this visit.  Visit Diagnosis:  Hemiplegia affecting right dominant side  Lack of coordination  Weakness of right hand  Cognitive deficits      Subjective Assessment - 01/28/15 1152    Subjective  I have to watch myself when I am using my R hand because I hike my shoulder sometimes.    Patient is accompained by: Family member  husband   Pertinent History glioblastoma with brain surgery to remove tumor 06/28/14, facial seizures   Patient Stated Goals To get my Rt shoulder and hand better b/c I'm Rt handed   Currently in Pain? No/denies  just sore muscles.                        OT Treatments/Exercises (OP) - 01/28/15 0001    Neurological Re-education Exercises   Other Exercises 1 Neuro re ed to address hand orienation for functional tasks that require picking up smaller objects of varying size, in hand manipulation, wrist and hand adjustment and orientation changes during activity with emphasis on cueing pt to use vision to compensate for coordination, sensation and motor planning. Alternated between multiple  activities to force pt to readjust based on activity and objects she was interacting with and to encourage more automatic movement. Pt is using RUE more at home in functional tasks (using RUE during all ADL's, eating 50% of meals with RUE, carrying objects with R hand, gripping bottles with R hand while opening with left hand, etc) however does not score well on standardize tests (ie. box and blocks, 9 hole peg, dynamometer) due to apraxia, sensory loss and impaired cognition.                  OT Short Term Goals - 01/28/15 1304    OT SHORT TERM GOAL #2   Title Pt to verbalize understanding with pain management strategies RUE (ongoing for renewal period - due 01/10/15)   Status Achieved   OT SHORT TERM GOAL #3   Title Pt to write name  at 75% or greater legibility w/ A/E for pen prn Rt dominant hand (due 01/10/15)   Baseline approx. 25% first name only with built up pen   Status Achieved   OT SHORT TERM GOAL #4   Title Improve RUE functional use as evidenced by performing 20 blocks or greater on Box & Blocks test   Baseline eval = 17, 12/04/14 = 12, 12/27/14 = 12   Status Not Met   OT SHORT TERM GOAL #5   Title Pt to improve grip strength Rt hand to 15 lbs or greater to decrease drops   Baseline eval = 10 lbs (Lt = 55 lbs); 12/04/14 = 10 lbs, 12/27/14 = 10 lbs   Status Not Met   OT SHORT TERM GOAL #7   Title Pt to use Rt dominant hand to feed [place] with finger foods 50% of the time or greater   Baseline none with Rt hand, using Lt non dominant hand   Status Achieved           OT Long Term Goals - 01/28/15 1304    OT LONG TERM GOAL #1   Title Independent with updated HEP for RUE strength (DUE 02/09/15)   Status On-going   OT LONG TERM GOAL #2   Title Pt to perform 90* of reaching RUE to retrieve/replace light weight object from high shelf 5 times w/o rest and min compensations   Baseline only 75% shoulder flexion   Status Achieved   OT LONG TERM GOAL #3   Title Improve RUE functional use as evidenced by performing 18 blocks on Box & Blocks test    Status On-going   OT LONG TERM GOAL #4   Title Improve coordination as evidenced by performing 9 hole peg test in under  2 min.    Baseline eval: only able to place 1 peg in 60 sec.    Status On-going   OT LONG TERM GOAL #5   Title Pt to improve grip strength Rt dominant hand to 15 lbs or greater to open jars/containers   Baseline eval = 10 lbs, 12/04/14 = 10 lbs   Status On-going   OT LONG TERM GOAL #6   Title Pt to return to using Rt hand as dominant hand for all BADLS 53% of the time or greater   Baseline only using LUE   Status Achieved   OT LONG TERM GOAL #7   Title Pt to write first and last name with Rt dominant hand using A/E prn with 75% legibility    Baseline  25% legibility for first name only   Status On-going  OT LONG TERM GOAL #8   Title Pt to return to light cooking/cleaning tasks with min assist with A/E and/or DME prn   Baseline dependent   Status On-going               Plan - 01/28/15 1305    Clinical Impression Statement Pt continues to make functional gains in using RUE at home (see note). Progress does not easily translate into standardized tests due to apraxia, sensory issues and decreased cognition.    Pt will benefit from skilled therapeutic intervention in order to improve on the following deficits (Retired) Decreased cognition;Decreased knowledge of use of DME;Impaired flexibility;Pain;Decreased coordination;Decreased mobility;Impaired sensation;Improper body mechanics;Decreased activity tolerance;Decreased endurance;Decreased range of motion;Decreased strength;Impaired tone;Decreased balance;Decreased knowledge of precautions;Decreased safety awareness;Impaired UE functional use;Impaired perceived functional ability   Rehab Potential Good   Clinical Impairments Affecting Rehab Potential apraxia, impaired sensation, impaired cogntion   OT Frequency 2x / week   OT Duration 8 weeks   OT Treatment/Interventions [place]-care/ADL training;Electrical Stimulation;Therapeutic exercise;Cognitive remediation/compensation;Moist Heat;Parrafin;Neuromuscular education;Splinting;Visual/perceptual remediation/compensation;Fluidtherapy;Energy conservation;Therapist, nutritional;Therapeutic exercises;Patient/family education;DME and/or AE instruction;Manual Therapy;Passive range of motion;Therapeutic activities   Plan Mid to high level reach, NMR to RUE and functional use of  hand   Consulted and Agree with Plan of Care Patient;Family member/caregiver   Family Member Consulted HUSBAND        Problem List Patient Active Problem List   Diagnosis Date Noted  . Monilial vaginitis 10/23/2014  . Deep vein thrombosis 08/12/2014  . History  of inferior vena caval filter placement 07/14/2014  . Malignant glioma of brain 06/28/2014  . Diabetes mellitus, type 2 06/27/2014  . 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 Tremor of both hands 04/05/2014  . Vertigo 04/05/2014  . BPPV (benign paroxysmal positional vertigo) 03/14/2014  . Seizures 12/16/2013  . Epigastric pain 11/01/2013  . Fever blister 10/16/2013  . Hypothyroidism 12/13/2012  . Other and unspecified hyperlipidemia 12/13/2012  . Anxiety state, unspecified 12/13/2012  . Edema 12/13/2012  . Anxiety state 12/13/2012  . Adult hypothyroidism 12/13/2012  . Urinary incontinence   . DEPRESSION 09/24/2007  . Essential hypertension 09/24/2007  . GERD 09/24/2007  . HIATAL HERNIA 09/24/2007  . 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 Headache(784.0) 09/24/2007    Quay Burow, OTR/L 01/28/2015, 1:08 PM  [facility] [address] [place], [place], [address] Phone: [phone]   Fax:  [phone]

## 2015-01-29 ENCOUNTER — Ambulatory Visit: Payer: Medicare Other | Admitting: Occupational Therapy

## 2015-01-29 ENCOUNTER — Encounter: Payer: Self-pay | Admitting: Occupational Therapy

## 2015-01-29 ENCOUNTER — Encounter: Payer: Self-pay | Admitting: Physical Therapy

## 2015-01-29 ENCOUNTER — Ambulatory Visit: Payer: Medicare Other | Admitting: Physical Therapy

## 2015-01-29 DIAGNOSIS — G8191 Hemiplegia, unspecified affecting right dominant side: Secondary | ICD-10-CM | POA: Diagnosis not present

## 2015-01-29 DIAGNOSIS — R269 Unspecified abnormalities of gait and mobility: Secondary | ICD-10-CM

## 2015-01-29 DIAGNOSIS — R279 Unspecified lack of coordination: Secondary | ICD-10-CM

## 2015-01-29 DIAGNOSIS — R29898 Other symptoms and signs involving the musculoskeletal system: Secondary | ICD-10-CM

## 2015-01-29 DIAGNOSIS — R6889 Other general symptoms and signs: Secondary | ICD-10-CM

## 2015-01-29 DIAGNOSIS — R2681 Unsteadiness on feet: Secondary | ICD-10-CM

## 2015-01-29 DIAGNOSIS — R2689 Other abnormalities of gait and mobility: Secondary | ICD-10-CM

## 2015-01-29 DIAGNOSIS — R4189 Other symptoms and signs involving cognitive functions and awareness: Secondary | ICD-10-CM

## 2015-01-29 NOTE — Therapy (Signed)
[facility] [address] [place], [place], [address] Phone: [phone]   Fax:  [phone]  Physical Therapy Treatment  Patient Details  Name: Kelly Fletcher MRN: [id number] Date of Birth: 10-08-1950 Referring Provider:  Estill Dooms, MD  Encounter Date: 01/29/2015      PT End of Session - 01/29/15 1100    Visit Number 21   Number of Visits 22   Date for PT Re-Evaluation 02/05/15   Authorization Type G-code & KX modifier   PT Start Time 1100   PT Stop Time 1145   PT Time Calculation (min) 45 min   Activity Tolerance Patient tolerated treatment well   Behavior During Therapy [facility] for tasks assessed/performed      Past Medical History  Diagnosis Date  . Uterine prolapse   . Cystocele   . GERD (gastroesophageal reflux disease)   . Elevated cholesterol   . Rectocele   . Hypertension   . Arthritis   . Type II or unspecified type diabetes mellitus without mention of complication, uncontrolled     Type 2  . Urinary incontinence     Urgency  . Anxiety state, unspecified   . Palpitations   . Allergic rhinitis, cause unspecified   . Other and unspecified hyperlipidemia   . Unspecified hypothyroidism   . Edema   . Insomnia, unspecified   . Depressive disorder, not elsewhere classified   . Unspecified urinary incontinence   . Peptic ulcer, unspecified site, unspecified as acute or chronic, without mention of hemorrhage, perforation, or obstruction   . Disturbance of skin sensation   . Calculus of kidney   . Facial spasm 04/05/2014  . Abnormal brain MRI 04/13/2014  . 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 Monilial vaginitis 10/23/2014    Past Surgical History  Procedure Laterality Date  . Tonsillectomy and adenoidectomy  1963  . Dilation and curettage of uterus      3-05  . Hysteroscopy      3-05  . Endometrial biopsy      Dr.Gottsegen    . Tongue biopsy      There were no vitals filed for this visit.  Visit Diagnosis:  Balance problems  Abnormality of gait  Decreased functional activity tolerance  Unsteadiness  Right leg weakness      Subjective Assessment - 01/29/15 1106    Subjective No falls or issues. Still not sure if blood levels will allow restart of chemo.   Currently in Pain? No/denies     Therapeutic Exercise: Pt & husband wrote title of all exercises performing at home. Pt return demo with PT cueing how to advance each exercise.  PT broke exercises into 4 groups: Group A- #7, Group B- 6, 8, 9, 10,  Group C-1, 4, 5, 12,  Group D- 2, 3, 11, 13  PT recommended Group A & B on Monday, Wednesday, Friday and Group C & D on Tuesday, Thursday, Saturday. Each group should take 10 minutes or less. REST on Sunday.  HEP:  1. Sit to/from stand without UE 2. Step into 4 squares 3. "Ta-Da" step to side with hip rotation & UE movement 4. Standing hip flexion alternate LE 5. Standing DF/ PF 6. Marching forward & backward 7. Stand in corner on pillow with eyes closed feet together head movements.  8. Walking tandem  9.Walking heels forward & backward 10. Walking on  toes forward & backward 11.Walking Figure 8's 12. Modified single leg stand with foot on 4-6" step hip abduction 13. Turning around 180*                          PT Education - 01/29/15 1100    Education provided Yes   Education Details updated HEP into 4 groups to decrease fatigue issues   Person(s) Educated Patient;Spouse   Methods Explanation;Demonstration;Tactile cues;Verbal cues;Handout   Comprehension Verbalized understanding;Returned demonstration;Verbal cues required;Tactile cues required          PT Short Term Goals - 01/01/15 1453    PT SHORT TERM GOAL #1   Title patient correcty demonstrates initial HEP with husband cueing. (Target Date: 11/09/2014)   Time 4   Period Weeks   Status Achieved   PT SHORT TERM  GOAL #2   Title Timed Up & Go <13.5 sec without device (Target Date: 11/09/2014)  Current time is 8.13 seconds (11/08/14)   Time 4   Period Weeks   Status Achieved   PT SHORT TERM GOAL #3   Title Berg Balance >46/56 (Target Date: 11/09/2014)  Current [name] is 54/56 (11/08/14)   Time 4   Period Weeks   Status Achieved   PT SHORT TERM GOAL #4   Title ambulates 500' without device including grass surfaces, ramps & curbs with supervision. (Target Date: 11/09/2014)   Time 4   Period Weeks   Status Achieved   PT SHORT TERM GOAL #5   Title ambulates 200' without device on grass with supervision while carrying on conversation. (Target Date: 01/04/2015)   Baseline 01/01/15: 500 feet with supervison on grass (400 feet) and pavement (100 feet) while engaged in conversation, pt doing most of the talking.   Time --   Period --   Status Achieved   PT SHORT TERM GOAL #6   Title Functional Gait Assessment >/= 25/30 (Target Date: 01/04/2015)   Baseline 10/01/14: 24/30 scored   Time --   Period --   Status Not Met           PT Long Term Goals - 12/06/14 1100    PT LONG TERM GOAL #1   Title demonstrates / verbalizes understanding of ongoing HEP / fitness plan. (UPDATED Target Date: 02/01/2015)   Baseline Met: 12/06/14 but with continued PT HEP / fitness plan will need to be progressed further.    Time 8   Period Weeks   Status On-going   PT LONG TERM GOAL #2   Title Cognitive Timed Up & Go <15 sec safely  (Target Date: 12/07/2014)   Baseline Met: 12/06/14 14.77 sec   Time 8   Period Weeks   Status Achieved   PT LONG TERM GOAL #3   Title Berg Balance >/=52/56  (UPDATED Target Date: 02/01/2015)   Baseline NOT MET 12/06/2014 [name] Balance 44/56    Time 8   Period Weeks   Status On-going   PT LONG TERM GOAL #4   Title Functional Gait Assessment >25/ 30  (UPDATED Target Date: 02/01/2015)   Baseline Partially MET 12/06/2014 FGA 23/30   Status On-going   PT LONG TERM GOAL #5   Title ambulates >1000' without  device including grass, ramps, curbs, stairs indpendently.  (Target Date: 12/07/2014)   Baseline partially met 12/06/14; pt able to ambulate >1000' without device independently on level surfaces indoors and outdoors, but still requires supervision for safety on grass   Time 8  Period Weeks   Status Partially Met   Additional Long Term Goals   Additional Long Term Goals Yes   PT LONG TERM GOAL #6   Title Cognitive Timed Up & Go <15% increase from standard TUG (UPDATED Target Date: 02/01/2015)   Time 8   Period Weeks   Status New   PT LONG TERM GOAL #7   Title Patient ambulates 200' on grass without device while carrying on conversation modified independent. (UPDATED Target Date: 02/01/2015)   Time 8   Period Weeks   Status New               Plan - 01/29/15 1100    Clinical Impression Statement Patient and husband seem to understand how to condense HEP. They seem to understand divide exercises into smaller groups to decrease fatigue factor. Patient appears to be exercising most of day and needs to do some things just for fun.   Pt will benefit from skilled therapeutic intervention in order to improve on the following deficits Abnormal gait;Decreased activity tolerance;Decreased balance;Decreased endurance;Decreased strength   Rehab Potential Good   PT Frequency 1x / week   PT Duration 8 weeks   PT Treatment/Interventions ADLs/[facility] Management;DME Instruction;Gait training;Stair training;Functional mobility training;Therapeutic activities;Therapeutic exercise;Balance training;Neuromuscular re-education;Patient/family education   PT Next Visit Plan Assess LTGs & discharge   Consulted and Agree with Plan of Care Patient;Family member/caregiver   Family Member Consulted husband        Problem List Patient Active Problem List   Diagnosis Date Noted  . Monilial vaginitis 10/23/2014  . Deep vein thrombosis 08/12/2014  . History of inferior vena caval filter placement  07/14/2014  . Malignant glioma of brain 06/28/2014  . Diabetes mellitus, type 2 06/27/2014  . 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 Tremor of both hands 04/05/2014  . Vertigo 04/05/2014  . BPPV (benign paroxysmal positional vertigo) 03/14/2014  . Seizures 12/16/2013  . Epigastric pain 11/01/2013  . Fever blister 10/16/2013  . Hypothyroidism 12/13/2012  . Other and unspecified hyperlipidemia 12/13/2012  . Anxiety state, unspecified 12/13/2012  . Edema 12/13/2012  . Anxiety state 12/13/2012  . Adult hypothyroidism 12/13/2012  . Urinary incontinence   . DEPRESSION 09/24/2007  . Essential hypertension 09/24/2007  . GERD 09/24/2007  . HIATAL HERNIA 09/24/2007  . 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 PVXYIAXK(553.7) 09/24/2007    Jamey Reas PT, DPT 01/29/2015, 1:33 PM  [facility] [address] [place], [place], [address] Phone: [phone]   Fax:  [phone]

## 2015-01-29 NOTE — Patient Instructions (Signed)
Updated HEP:  1. First and foremost USE YOUR HAND AND ARM FUNCTIONALLY!!! If you are too tired to do your exercises just focus on using the right arm as much as you can as you go through your day for functional tasks.  Right now that is the MOST important part of your home program.  2.  Supine:  Lay on your back and hold a three pound weight between your hands with palms facing each other.  Raise weight toward the ceiling until elbows are straight.  Keeping elbows straight slowly raise the weight over your head as far as you can within your pain limits (stretching is ok, pain in the joint is not).  Then slowly lower weight toward your knees, keeping elbows straight all the time.  Do 10, rest then do 10  More.  2.  Supine:  While your husband supports your arm, keep arm next to your body and bend your elbow.  Raise elbow out to the side away your body as far as you can go within your pain limits.  Return to starting position.  Do 10, rest then do 10 more.  3. Sitting: continue to do your two exercises in sitting as you have been.

## 2015-01-29 NOTE — Therapy (Signed)
[facility] [address] [place], [place], [address] Phone: [phone]   Fax:  [phone]  Occupational Therapy Treatment  Patient Details  Name: Kelly Fletcher MRN: [id number] Date of Birth: 09/27/1950 Referring Provider:  Estill Dooms, MD  Encounter Date: 01/29/2015      OT End of Session - 01/29/15 1612    Visit Number 29   Number of Visits 30   Date for OT Re-Evaluation 02/09/15  next visit   Authorization Type MCR - G code needed   Authorization Time Period 60 Days   Authorization - Visit Number 2   Authorization - Number of Visits 30   OT Start Time 1148   OT Stop Time 1235   OT Time Calculation (min) 47 min   Activity Tolerance Patient tolerated treatment well      Past Medical History  Diagnosis Date  . Uterine prolapse   . Cystocele   . GERD (gastroesophageal reflux disease)   . Elevated cholesterol   . Rectocele   . Hypertension   . Arthritis   . Type II or unspecified type diabetes mellitus without mention of complication, uncontrolled     Type 2  . Urinary incontinence     Urgency  . Anxiety state, unspecified   . Palpitations   . Allergic rhinitis, cause unspecified   . Other and unspecified hyperlipidemia   . Unspecified hypothyroidism   . Edema   . Insomnia, unspecified   . Depressive disorder, not elsewhere classified   . Unspecified urinary incontinence   . Peptic ulcer, unspecified site, unspecified as acute or chronic, without mention of hemorrhage, perforation, or obstruction   . Disturbance of skin sensation   . Calculus of kidney   . Facial spasm 04/05/2014  . Abnormal brain MRI 04/13/2014  . 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 Monilial vaginitis 10/23/2014    Past Surgical History  Procedure Laterality Date  . Tonsillectomy and adenoidectomy  1963  . Dilation and curettage of uterus      3-05   . Hysteroscopy      3-05  . Endometrial biopsy      Dr.Gottsegen   . Tongue biopsy      There were no vitals filed for this visit.  Visit Diagnosis:  Hemiplegia affecting right dominant side  Lack of coordination  Weakness of right hand  Cognitive deficits      Subjective Assessment - 01/29/15 1153    Subjective  We are going on vacation in November   Patient is accompained by: Family member  husband   Pertinent History glioblastoma with brain surgery to remove tumor 06/28/14, facial seizures   Patient Stated Goals To get my Rt shoulder and hand better b/c I'm Rt handed   Currently in Pain? No/denies                      OT Treatments/Exercises (OP) - 01/29/15 0001    Neurological Re-education Exercises   Other Exercises 1 Neuro re ed to address wrist movement and adjustment relative to functional task - i.e adjusting wrist to set cup on table, drink from a cup, pouring.  Pt initially with great difficulty due to apraxia, cognitive impairments and sensory issues however greatly improved with practice.  Also addresed and updated HEP based on pt progress and in light of upcoming chemo treatments.  Please  see pt instruction section for details.                 OT Education - 01/29/15 1606    Education provided Yes   Education Details updated HEP for RUE   Person(s) Educated Patient;Spouse   Methods Explanation;Demonstration;Tactile cues;Verbal cues   Comprehension Verbalized understanding;Returned demonstration  with husband's support          OT Short Term Goals - 01/29/15 1607    OT SHORT TERM GOAL #2   Title Pt to verbalize understanding with pain management strategies RUE (ongoing for renewal period - due 01/10/15)   Status Achieved   OT SHORT TERM GOAL #3   Title Pt to write name at 75% or greater legibility w/ A/E for pen prn Rt dominant hand (due 01/10/15)   Baseline approx. 25% first name only with built up pen   Status Achieved   OT  SHORT TERM GOAL #4   Title Improve RUE functional use as evidenced by performing 20 blocks or greater on Box & Blocks test   Baseline eval = 17, 12/04/14 = 12, 12/27/14 = 12   Status Not Met   OT SHORT TERM GOAL #5   Title Pt to improve grip strength Rt hand to 15 lbs or greater to decrease drops   Baseline eval = 10 lbs (Lt = 55 lbs); 12/04/14 = 10 lbs, 12/27/14 = 10 lbs   Status Not Met   OT SHORT TERM GOAL #7   Title Pt to use Rt dominant hand to feed [place] with finger foods 50% of the time or greater   Baseline none with Rt hand, using Lt non dominant hand   Status Achieved           OT Long Term Goals - 01/29/15 1607    OT LONG TERM GOAL #1   Title Independent with updated HEP for RUE strength (DUE 02/09/15)   Status On-going   OT LONG TERM GOAL #2   Title Pt to perform 90* of reaching RUE to retrieve/replace light weight object from high shelf 5 times w/o rest and min compensations   Baseline only 75% shoulder flexion   Status Achieved   OT LONG TERM GOAL #3   Title Improve RUE functional use as evidenced by performing 18 blocks on Box & Blocks test    Status Deferred  will defer goal as pt has great difficulty with standardized tests due to cognitive, sensory and motor planning deficits however functional use of the RUE has greatly improved.    OT LONG TERM GOAL #4   Title Improve coordination as evidenced by performing 9 hole peg test in under  2 min.    Baseline eval: only able to place 1 peg in 60 sec.    Status Deferred  defer goal as pt has great difficulty with standardized tests due to apraxia, sensory issues and cognitive impairment however RUE has greatly improved in functional use.    OT LONG TERM GOAL #5   Title Pt to improve grip strength as evidenced by ability  to open jars/containers   Baseline eval = 10 lbs, 12/04/14 = 10 lbs   Status Achieved   OT LONG TERM GOAL #6   Title Pt to return to using Rt hand as dominant hand for all BADLS 62% of the time or greater    Baseline only using LUE   Status Achieved   OT LONG TERM GOAL #7   Title Pt to write first  and last name with Rt dominant hand using A/E prn with 75% legibility    Baseline 25% legibility for first name only   Status On-going   OT LONG TERM GOAL #8   Title Pt to return to light cooking/cleaning tasks with min assist with A/E and/or DME prn   Baseline dependent   Status On-going               Plan - 01/29/15 1610    Clinical Impression Statement Pt continues to make slow but steady progress toward LTG's.  Updated pts' HEP with more challenging exercises and with increased emphasis on functional use of the RUE.    Pt will benefit from skilled therapeutic intervention in order to improve on the following deficits (Retired) Decreased cognition;Decreased knowledge of use of DME;Impaired flexibility;Pain;Decreased coordination;Decreased mobility;Impaired sensation;Improper body mechanics;Decreased activity tolerance;Decreased endurance;Decreased range of motion;Decreased strength;Impaired tone;Decreased balance;Decreased knowledge of precautions;Decreased safety awareness;Impaired UE functional use;Impaired perceived functional ability   Rehab Potential Good   Clinical Impairments Affecting Rehab Potential apraxia, impaired sensation, impaired cogntion   OT Frequency 2x / week   OT Duration 8 weeks   OT Treatment/Interventions [place]-care/ADL training;Electrical Stimulation;Therapeutic exercise;Cognitive remediation/compensation;Moist Heat;Parrafin;Neuromuscular education;Splinting;Visual/perceptual remediation/compensation;Fluidtherapy;Energy conservation;Therapist, nutritional;Therapeutic exercises;Patient/family education;DME and/or AE instruction;Manual Therapy;Passive range of motion;Therapeutic activities   Plan address shoulder tightness, NMR to RUE for reach and functional use of hand   Consulted and Agree with Plan of Care Patient;Family member/caregiver   Family Member Consulted  HUSBAND        Problem List Patient Active Problem List   Diagnosis Date Noted  . Monilial vaginitis 10/23/2014  . Deep vein thrombosis 08/12/2014  . History of inferior vena caval filter placement 07/14/2014  . Malignant glioma of brain 06/28/2014  . Diabetes mellitus, type 2 06/27/2014  . 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 Tremor of both hands 04/05/2014  . Vertigo 04/05/2014  . BPPV (benign paroxysmal positional vertigo) 03/14/2014  . Seizures 12/16/2013  . Epigastric pain 11/01/2013  . Fever blister 10/16/2013  . Hypothyroidism 12/13/2012  . Other and unspecified hyperlipidemia 12/13/2012  . Anxiety state, unspecified 12/13/2012  . Edema 12/13/2012  . Anxiety state 12/13/2012  . Adult hypothyroidism 12/13/2012  . Urinary incontinence   . DEPRESSION 09/24/2007  . Essential hypertension 09/24/2007  . GERD 09/24/2007  . HIATAL HERNIA 09/24/2007  . 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 Headache(784.0) 09/24/2007    Quay Burow, OTR/L 01/29/2015, 4:13 PM  [facility] [address] [place], [place], [address] Phone: [phone]   Fax:  [phone]

## 2015-01-30 ENCOUNTER — Telehealth: Payer: Self-pay | Admitting: *Deleted

## 2015-01-30 ENCOUNTER — Ambulatory Visit (HOSPITAL_BASED_OUTPATIENT_CLINIC_OR_DEPARTMENT_OTHER): Payer: Medicare Other

## 2015-01-30 DIAGNOSIS — D696 Thrombocytopenia, unspecified: Secondary | ICD-10-CM

## 2015-01-30 DIAGNOSIS — C711 Malignant neoplasm of frontal lobe: Secondary | ICD-10-CM

## 2015-01-30 LAB — CBC WITH DIFFERENTIAL/PLATELET
BASO%: 0.7 % (ref 0.0–2.0)
BASOS ABS: 0 10*3/uL (ref 0.0–0.1)
EOS ABS: 0.1 10*3/uL (ref 0.0–0.5)
EOS%: 2.8 % (ref 0.0–7.0)
HCT: 35.4 % (ref 34.8–46.6)
HGB: 12 g/dL (ref 11.6–15.9)
LYMPH%: 21.6 % (ref 14.0–49.7)
MCH: 34 pg (ref 25.1–34.0)
MCHC: 33.9 g/dL (ref 31.5–36.0)
MCV: 100.3 fL (ref 79.5–101.0)
MONO#: 0.3 10*3/uL (ref 0.1–0.9)
MONO%: 8.4 % (ref 0.0–14.0)
NEUT#: 2.8 10*3/uL (ref 1.5–6.5)
NEUT%: 66.5 % (ref 38.4–76.8)
PLATELETS: 165 10*3/uL (ref 145–400)
RBC: 3.53 10*6/uL — ABNORMAL LOW (ref 3.70–5.45)
RDW: 16.6 % — ABNORMAL HIGH (ref 11.2–14.5)
WBC: 4.2 10*3/uL (ref 3.9–10.3)
lymph#: 0.9 10*3/uL (ref 0.9–3.3)

## 2015-01-30 NOTE — Telephone Encounter (Signed)
Message from pt requesting lab draw today. Scheduled to see MD at [facility] on 9/1. Returned call, informed Kelly Fletcher pt is scheduled for lab prior to MD visit 9/7. Follow up as scheduled here and at [facility]. Pt's husband stated she is worried about her platelet level and is not scheduled for lab on 9/1 at [facility] Has MRI and office visit only.  OK, per Dr. Benay Spice to check lab today. Orders entered. Instructed pt's husband to bring her in at 2 for lab. Schedulers will call if 2PM slot is not available.

## 2015-02-01 ENCOUNTER — Other Ambulatory Visit: Payer: Self-pay | Admitting: *Deleted

## 2015-02-01 DIAGNOSIS — C719 Malignant neoplasm of brain, unspecified: Secondary | ICD-10-CM

## 2015-02-01 MED ORDER — TEMOZOLOMIDE 140 MG PO CAPS: 280.0000 mg | ORAL_CAPSULE | Freq: Every day | ORAL | Status: DC

## 2015-02-05 ENCOUNTER — Encounter: Payer: Self-pay | Admitting: Physical Therapy

## 2015-02-05 ENCOUNTER — Encounter: Payer: Self-pay | Admitting: Occupational Therapy

## 2015-02-05 ENCOUNTER — Other Ambulatory Visit: Payer: Self-pay | Admitting: *Deleted

## 2015-02-05 ENCOUNTER — Telehealth: Payer: Self-pay | Admitting: Oncology

## 2015-02-05 ENCOUNTER — Ambulatory Visit: Payer: Medicare Other | Attending: Radiology | Admitting: Physical Therapy

## 2015-02-05 ENCOUNTER — Ambulatory Visit: Payer: Medicare Other | Admitting: Occupational Therapy

## 2015-02-05 DIAGNOSIS — R29898 Other symptoms and signs involving the musculoskeletal system: Secondary | ICD-10-CM

## 2015-02-05 DIAGNOSIS — R279 Unspecified lack of coordination: Secondary | ICD-10-CM | POA: Insufficient documentation

## 2015-02-05 DIAGNOSIS — R482 Apraxia: Secondary | ICD-10-CM | POA: Diagnosis present

## 2015-02-05 DIAGNOSIS — R6889 Other general symptoms and signs: Secondary | ICD-10-CM | POA: Insufficient documentation

## 2015-02-05 DIAGNOSIS — R4189 Other symptoms and signs involving cognitive functions and awareness: Secondary | ICD-10-CM | POA: Insufficient documentation

## 2015-02-05 DIAGNOSIS — R29818 Other symptoms and signs involving the nervous system: Secondary | ICD-10-CM | POA: Insufficient documentation

## 2015-02-05 DIAGNOSIS — R2681 Unsteadiness on feet: Secondary | ICD-10-CM | POA: Insufficient documentation

## 2015-02-05 DIAGNOSIS — R2689 Other abnormalities of gait and mobility: Secondary | ICD-10-CM

## 2015-02-05 DIAGNOSIS — R269 Unspecified abnormalities of gait and mobility: Secondary | ICD-10-CM

## 2015-02-05 DIAGNOSIS — M25511 Pain in right shoulder: Secondary | ICD-10-CM | POA: Diagnosis present

## 2015-02-05 DIAGNOSIS — G8191 Hemiplegia, unspecified affecting right dominant side: Secondary | ICD-10-CM | POA: Insufficient documentation

## 2015-02-05 DIAGNOSIS — M6289 Other specified disorders of muscle: Secondary | ICD-10-CM | POA: Diagnosis present

## 2015-02-05 NOTE — Telephone Encounter (Signed)
s.w. pt and advised on appt....pt is going to be at [facility]...cx 9.7 appt.Marland Kitchen

## 2015-02-05 NOTE — Therapy (Signed)
[facility] [address] [place], [place], [address] Phone: [phone]   Fax:  [phone]  Occupational Therapy Treatment  Patient Details  Name: Kelly Fletcher MRN: [id number] Date of Birth: 27-Jun-1950 Referring Provider:  Estill Dooms, MD  Encounter Date: 02/05/2015      OT End of Session - 02/05/15 1254    Visit Number 30   Number of Visits 30   Date for OT Re-Evaluation 03/08/15   Authorization Type MCR - G code needed   Authorization Time Period 60 Days - 04/02/2015   Authorization - Visit Number 30   Authorization - Number of Visits 30   OT Start Time 1147   OT Stop Time 1231   OT Time Calculation (min) 44 min   Activity Tolerance Patient tolerated treatment well      Past Medical History  Diagnosis Date  . Uterine prolapse   . Cystocele   . GERD (gastroesophageal reflux disease)   . Elevated cholesterol   . Rectocele   . Hypertension   . Arthritis   . Type II or unspecified type diabetes mellitus without mention of complication, uncontrolled     Type 2  . Urinary incontinence     Urgency  . Anxiety state, unspecified   . Palpitations   . Allergic rhinitis, cause unspecified   . Other and unspecified hyperlipidemia   . Unspecified hypothyroidism   . Edema   . Insomnia, unspecified   . Depressive disorder, not elsewhere classified   . Unspecified urinary incontinence   . Peptic ulcer, unspecified site, unspecified as acute or chronic, without mention of hemorrhage, perforation, or obstruction   . Disturbance of skin sensation   . Calculus of kidney   . Facial spasm 04/05/2014  . Abnormal brain MRI 04/13/2014  . 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 Monilial vaginitis 10/23/2014    Past Surgical History  Procedure Laterality Date  . Tonsillectomy and adenoidectomy  1963  . Dilation and curettage of uterus      3-05  .  Hysteroscopy      3-05  . Endometrial biopsy      Dr.Gottsegen   . Tongue biopsy      There were no vitals filed for this visit.  Visit Diagnosis:  Hemiplegia affecting right dominant side - Plan: Ot plan of care cert/re-cert  Lack of coordination - Plan: Ot plan of care cert/re-cert  Weakness of right hand - Plan: Ot plan of care cert/re-cert  Cognitive deficits - Plan: Ot plan of care cert/re-cert  Weakness of right arm - Plan: Ot plan of care cert/re-cert  Pain in joint, shoulder region, right - Plan: Ot plan of care cert/re-cert  Apraxia - Plan: Ot plan of care cert/re-cert      Subjective Assessment - 02/05/15 1151    Subjective  I am having my fillter removed tomorrow - my platelets are up so they will start chemo on Friday   Patient is accompained by: Family member   Pertinent History glioblastoma with brain surgery to remove tumor 06/28/14, facial seizures   Patient Stated Goals To get my Rt shoulder and hand better b/c I'm Rt handed   Currently in Pain? No/denies                      OT Treatments/Exercises (OP) - 02/05/15 0001    ADLs  Cooking Treatment focused on completing meal prep - pt made scrambled eggs. Pt able to complete the task with supevision only however needs mod vc's to use R hand more functionally and to use vision to accomodate for poor sensation and apraxia.  Pt did not however need any physical assistance to complete meal prep and was safe around stove.  Encouraged pt to start making breakfast at home on days she is feeling up to it (pt starts chemo again this Friday) with husband supervising and cueing to use R hand. Pt and husband in agreement.                  OT Short Term Goals - 02/05/15 1240    OT SHORT TERM GOAL #2   Title Pt to verbalize understanding with pain management strategies RUE (ongoing for renewal period - due 01/10/15)   Status Achieved   OT SHORT TERM GOAL #3   Title Pt to write name at 75% or  greater legibility w/ A/E for pen prn Rt dominant hand (due 01/10/15)   Baseline approx. 25% first name only with built up pen   Status Achieved   OT SHORT TERM GOAL #4   Title Improve RUE functional use as evidenced by performing 20 blocks or greater on Box & Blocks test   Baseline eval = 17, 12/04/14 = 12, 12/27/14 = 12   Status Not Met   OT SHORT TERM GOAL #5   Title Pt to improve grip strength Rt hand to 15 lbs or greater to decrease drops   Baseline eval = 10 lbs (Lt = 55 lbs); 12/04/14 = 10 lbs, 12/27/14 = 10 lbs   Status Not Met   OT SHORT TERM GOAL #7   Title Pt to use Rt dominant hand to feed [place] with finger foods 50% of the time or greater   Baseline none with Rt hand, using Lt non dominant hand   Status Achieved           OT Long Term Goals - 02/05/15 1258    OT LONG TERM GOAL #1   Title Independent with updated HEP for RUE strength (DUE 02/09/15)   Status Achieved   OT LONG TERM GOAL #2   Title Pt to perform 90* of reaching RUE to retrieve/replace light weight object from high shelf 5 times w/o rest and min compensations   Baseline only 75% shoulder flexion   Status Achieved   OT LONG TERM GOAL #3   Title Improve RUE functional use as evidenced by performing 18 blocks on Box & Blocks test    Status Deferred  will defer goal as pt has great difficulty with standardized tests due to cognitive, sensory and motor planning deficits however functional use of the RUE has greatly improved.    OT LONG TERM GOAL #4   Title Improve coordination as evidenced by performing 9 hole peg test in under  2 min.    Baseline eval: only able to place 1 peg in 60 sec.    Status Deferred  defer goal as pt has great difficulty with standardized tests due to apraxia, sensory issues and cognitive impairment however RUE has greatly improved in functional use.    OT LONG TERM GOAL #5   Title Pt to improve grip strength as evidenced by ability  to open jars/containers   Baseline eval = 10 lbs, 12/04/14  = 10 lbs   Status Achieved   OT LONG TERM GOAL #6   Title  Pt to return to using Rt hand as dominant hand for all BADLS 20% of the time or greater   Baseline only using LUE   Status Achieved   OT LONG TERM GOAL #7   Title Pt to write first and last name with Rt dominant hand using A/E prn with 75% legibility    Baseline 25% legibility for first name only   Status Not Met   OT LONG TERM GOAL #8   Title Pt to return to light cooking/cleaning tasks with min assist with A/E and/or DME prn   Baseline dependent   Status Achieved   OT LONG TERM GOAL  #9   Baseline Pt will demonstrate ability to use RUE as domimant hand in at least 50% of IADL activities - 03/08/2015   Status New   OT LONG TERM GOAL  #10   TITLE Pt will demonstrate ability to correctly orient hand and pick up and release light objects of varying size and shape with no more than min vc's   Status New   OT LONG TERM GOAL  #11   TITLE Pt will demonstrate ability to write first and last name with AE prn with 100% legibility   Status New   OT LONG TERM GOAL  #12   TITLE Pt willl be able to pick up light obejct off shelf at or above 95* of flexion without pain with no more than min compensations.   Status New               Plan - 03/07/2015 1244    Clinical Impression Statement Pt continues to make slow steady progress. Pt has met 5/6 LTG's and is using RUE more functionally at home per pt and husband. Pt does not score well on standardized tests (i.e. box and blocks, 9 hole peg or dynamomter) due to apraxia, sensory impairment and cogntivve impairment however pt exhibit very steady increase in functional use of hand. Pt still struggles with higher reach as well as coordination in R hand and appropriate hand orientation as well as more automatic use of R hand which would improve functional use. Given that pt is still making slow but steady progress plan to renew for 4 more weeks -pt and husband in agreement.   Pt will benefit  from skilled therapeutic intervention in order to improve on the following deficits (Retired) Decreased cognition;Decreased knowledge of use of DME;Impaired flexibility;Pain;Decreased coordination;Decreased mobility;Impaired sensation;Improper body mechanics;Decreased activity tolerance;Decreased endurance;Decreased range of motion;Decreased strength;Impaired tone;Decreased balance;Decreased knowledge of precautions;Decreased safety awareness;Impaired UE functional use;Impaired perceived functional ability   Rehab Potential Good   Clinical Impairments Affecting Rehab Potential apraxia, impaired sensation, impaired cogntion   OT Frequency 2x / week   OT Duration 4 weeks   OT Treatment/Interventions Mcneal-care/ADL training;Electrical Stimulation;Therapeutic exercise;Cognitive remediation/compensation;Moist Heat;Parrafin;Neuromuscular education;Splinting;Visual/perceptual remediation/compensation;Fluidtherapy;Energy conservation;Therapist, nutritional;Therapeutic exercises;Patient/family education;DME and/or AE instruction;Manual Therapy;Passive range of motion;Therapeutic activities   Plan NMR to RUE for mid to high reach as well as functional use of hand, writing   Consulted and Agree with Plan of Care Patient;Family member/caregiver   Family Member Consulted HUSBAND          G-Codes - 07-Mar-2015 1257    Functional Assessment Tool Used clinical observation of functional use of RUE, writing legibility   Functional Limitation Carrying, moving and handling objects   Carrying, Moving and Handling Objects Current Status (N4709) At least 40 percent but less than 60 percent impaired, limited or restricted   Carrying, Moving and Handling Objects Goal Status (  G8985) At least 20 percent but less than 40 percent impaired, limited or restricted      Problem List Patient Active Problem List   Diagnosis Date Noted  . Monilial vaginitis 10/23/2014  . Deep vein thrombosis 08/12/2014  . History of  inferior vena caval filter placement 07/14/2014  . Malignant glioma of brain 06/28/2014  . Diabetes mellitus, type 2 06/27/2014  . 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 Tremor of both hands 04/05/2014  . Vertigo 04/05/2014  . BPPV (benign paroxysmal positional vertigo) 03/14/2014  . Seizures 12/16/2013  . Epigastric pain 11/01/2013  . Fever blister 10/16/2013  . Hypothyroidism 12/13/2012  . Other and unspecified hyperlipidemia 12/13/2012  . Anxiety state, unspecified 12/13/2012  . Edema 12/13/2012  . Anxiety state 12/13/2012  . Adult hypothyroidism 12/13/2012  . Urinary incontinence   . DEPRESSION 09/24/2007  . Essential hypertension 09/24/2007  . GERD 09/24/2007  . HIATAL HERNIA 09/24/2007  . 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 Headache(784.0) 09/24/2007   Occupational Therapy Progress Note  Dates of Reporting Period: 01/01/2015 to 02/05/2015  Objective Reports of Subjective Statement: Pt has met 5/6 LTG's - see above for goal status.  Pt is now able to write her first name with 100% legibility, use her RUE as dominant in 50% of basic ADL tasks, returned to light cooking with supervision and functionally complete mid reach. Pt does not score well on standardized tests due to apraxia, sensory loss and cognitive impairments however is using her RUE much more functionally in the clinic and at home per pt and husband.  Objective Measurements:  See above  Goal Update: See above  Plan: Given slow but steady progress plan to renew pt for 4 more weeks - see new goals above.Pt and husband in agreement.  Reason Skilled Services are Required: address mid to high reach, functional use of the hand, coordination and motor planning  Quay Burow , OTR/L  02/05/2015, 1:09 PM  [facility] [address] [place], [place], [address] Phone: [phone]   Fax:  [phone]

## 2015-02-06 ENCOUNTER — Other Ambulatory Visit: Payer: Medicare Other

## 2015-02-06 ENCOUNTER — Ambulatory Visit: Payer: Medicare Other | Admitting: Oncology

## 2015-02-06 NOTE — Therapy (Signed)
[facility] [address] [place], [place], [address] Phone: [phone]   Fax:  [phone]  Physical Therapy Treatment  Patient Details  Name: Kelly Fletcher MRN: [id number] Date of Birth: [birth year] Referring Provider:  Estill Dooms, MD  Encounter Date: 02/05/2015  PHYSICAL THERAPY DISCHARGE SUMMARY  Visits from Start of Care: 22  Current functional level related to goals / functional outcomes: See below   Remaining deficits: See below   Education / Equipment: HEP / ongoing fitness plan  Plan: Patient agrees to discharge.  Patient goals were met. Patient is being discharged due to meeting the stated rehab goals.  ?????            PT End of Session - 02/05/15 1100    Visit Number 22   Number of Visits 22   Date for PT Re-Evaluation 02/05/15   Authorization Type G-code & KX modifier   PT Start Time 1100   PT Stop Time 1145   PT Time Calculation (min) 45 min   Activity Tolerance Patient tolerated treatment well   Behavior During Therapy WFL for tasks assessed/performed      Past Medical History  Diagnosis Date  . Uterine prolapse   . Cystocele   . GERD (gastroesophageal reflux disease)   . Elevated cholesterol   . Rectocele   . Hypertension   . Arthritis   . Type II or unspecified type diabetes mellitus without mention of complication, uncontrolled     Type 2  . Urinary incontinence     Urgency  . Anxiety state, unspecified   . Palpitations   . Allergic rhinitis, cause unspecified   . Other and unspecified hyperlipidemia   . Unspecified hypothyroidism   . Edema   . Insomnia, unspecified   . Depressive disorder, not elsewhere classified   . Unspecified urinary incontinence   . Peptic ulcer, unspecified site, unspecified as acute or chronic, without mention of hemorrhage, perforation, or obstruction   . Disturbance of skin sensation   . Calculus of kidney   . Facial spasm 04/05/2014  .  Abnormal brain MRI 04/13/2014  . 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 Monilial vaginitis 10/23/2014    Past Surgical History  Procedure Laterality Date  . Tonsillectomy and adenoidectomy  1963  . Dilation and curettage of uterus      3-05  . Hysteroscopy      3-05  . Endometrial biopsy      Dr.Gottsegen   . Tongue biopsy      There were no vitals filed for this visit.  Visit Diagnosis:  Balance problems  Abnormality of gait  Decreased functional activity tolerance  Unsteadiness      Subjective Assessment - 02/05/15 1103    Subjective She went to [facility] on Thursday, 12/31/14. They ran an [place] to rule out clots. She is having filter in groin removed tomorrow.    Currently in Pain? No/denies            [facility] PT Assessment - 02/05/15 1100    Timed Up and Go Test   Normal TUG (seconds) 11.33   Cognitive TUG (seconds) 11.63  2.5% increase   Functional Gait  Assessment   Gait Level Surface Walks 20 ft in less than 5.5 sec, no assistive devices, good speed, no evidence for imbalance, normal gait pattern, deviates no more than 6 in outside of  the 12 in walkway width.   Change in Gait Speed Able to smoothly change walking speed without loss of balance or gait deviation. Deviate no more than 6 in outside of the 12 in walkway width.   Gait with Horizontal Head Turns Performs head turns smoothly with no change in gait. Deviates no more than 6 in outside 12 in walkway width   Gait with Vertical Head Turns Performs head turns with no change in gait. Deviates no more than 6 in outside 12 in walkway width.   Gait and Pivot Turn Pivot turns safely within 3 sec and stops quickly with no loss of balance.   Step Over Obstacle Is able to step over 2 stacked shoe boxes taped together (9 in total height) without changing gait speed. No evidence of imbalance.   Gait with Narrow Base of Support Ambulates 4-7 steps.   Gait with  Eyes Closed Walks 20 ft, uses assistive device, slower speed, mild gait deviations, deviates 6-10 in outside 12 in walkway width. Ambulates 20 ft in less than 9 sec but greater than 7 sec.   Ambulating Backwards Walks 20 ft, no assistive devices, good speed, no evidence for imbalance, normal gait   Steps Alternating feet, must use rail.   Total Score 26                     [facility] Adult PT Treatment/Exercise - 02/05/15 1100    Ambulation/Gait   Ambulation/Gait Yes   Ambulation/Gait Assistance 7: Independent   Ambulation/Gait Assistance Details carrying on conversation & scanning environment   Ambulation Distance (Feet) 1200 Feet   Assistive device None   Gait Pattern Within Functional Limits   Ambulation Surface Indoor;Level;Outdoor;Unlevel;Paved;Gravel;Grass   Gait velocity 3.65 ft/sec   Stairs Yes   Stairs Assistance 6: Modified independent (Device/Increase time)   Stair Management Technique One rail Left;Alternating pattern;Forwards   Number of Stairs 4  3 reps   Ramp 7: Independent   Curb 7: Independent   Berg Balance Test   Sit to Stand Able to stand without using hands and stabilize independently   Standing Unsupported Able to stand safely 2 minutes   Sitting with Back Unsupported but Feet Supported on Floor or Stool Able to sit safely and securely 2 minutes   Stand to Sit Sits safely with minimal use of hands   Transfers Able to transfer safely, minor use of hands   Standing Unsupported with Eyes Closed Able to stand 10 seconds safely   Standing Ubsupported with Feet Together Able to place feet together independently and stand 1 minute safely   From Standing, Reach Forward with Outstretched Arm Can reach confidently >25 cm (10")   From Standing Position, Pick up Object from Floor Able to pick up shoe safely and easily   From Standing Position, Turn to Look Behind Over each Shoulder Looks behind from both sides and weight shifts well   Turn 360 Degrees Able to turn  360 degrees safely one side only in 4 seconds or less   Standing Unsupported, Alternately Place Feet on Step/Stool Able to stand independently and safely and complete 8 steps in 20 seconds   Standing Unsupported, One Foot in Front Able to plae foot ahead of the other independently and hold 30 seconds   Standing on One Leg Able to lift leg independently and hold equal to or more than 3 seconds   Total Score 52   Timed Up and Go Test   TUG --  PT Short Term Goals - 01/01/15 1453    PT SHORT TERM GOAL #1   Title patient correcty demonstrates initial HEP with husband cueing. (Target Date: 11/09/2014)   Time 4   Period Weeks   Status Achieved   PT SHORT TERM GOAL #2   Title Timed Up & Go <13.5 sec without device (Target Date: 11/09/2014)  Current time is 8.13 seconds (11/08/14)   Time 4   Period Weeks   Status Achieved   PT SHORT TERM GOAL #3   Title Berg Balance >46/56 (Target Date: 11/09/2014)  Current [name] is 54/56 (11/08/14)   Time 4   Period Weeks   Status Achieved   PT SHORT TERM GOAL #4   Title ambulates 500' without device including grass surfaces, ramps & curbs with supervision. (Target Date: 11/09/2014)   Time 4   Period Weeks   Status Achieved   PT SHORT TERM GOAL #5   Title ambulates 200' without device on grass with supervision while carrying on conversation. (Target Date: 01/04/2015)   Baseline 01/01/15: 500 feet with supervison on grass (400 feet) and pavement (100 feet) while engaged in conversation, pt doing most of the talking.   Time --   Period --   Status Achieved   PT SHORT TERM GOAL #6   Title Functional Gait Assessment >/= 25/30 (Target Date: 01/04/2015)   Baseline 10/01/14: 24/30 scored   Time --   Period --   Status Not Met           PT Long Term Goals - 02/05/15 0630    PT LONG TERM GOAL #1   Title demonstrates / verbalizes understanding of ongoing HEP / fitness plan. (UPDATED Target Date: 02/01/2015)   Baseline MET 02/05/2015    Time 8   Period Weeks   Status Achieved   PT LONG TERM GOAL #2   Title Cognitive Timed Up & Go <15 sec safely  (Target Date: 12/07/2014)   Baseline Met: 12/06/14 14.77 sec   Time 8   Period Weeks   Status Achieved   PT LONG TERM GOAL #3   Title Berg Balance >/=52/56  (UPDATED Target Date: 02/01/2015)   Baseline MET 02/05/2015  [name] Balance 52/56   Time 8   Period Weeks   Status Achieved   PT LONG TERM GOAL #4   Title Functional Gait Assessment >25/ 30  (UPDATED Target Date: 02/01/2015)   Baseline MET 02/05/2015  FGA 26/30   Status Achieved   PT LONG TERM GOAL #5   Title ambulates >1000' without device including grass, ramps, curbs, stairs indpendently.  (Target Date: 12/07/2014)   Baseline MET 02/04/2105    Time 8   Period Weeks   Status Achieved   PT LONG TERM GOAL #6   Title Cognitive Timed Up & Go <15% increase from standard TUG (UPDATED Target Date: 02/01/2015)   Baseline MET 02/05/2015  Cognitive TUG (11.63sec) only 2.5% increase from normal TUG (11.33sec).   Time 8   Period Weeks   Status Achieved   PT LONG TERM GOAL #7   Title Patient ambulates 200' on grass without device while carrying on conversation modified independent. (UPDATED Target Date: 02/01/2015)   Baseline MET 02/05/2015   Time 8   Period Weeks   Status Achieved               Plan - 02/05/15 1100    Clinical Impression Statement Patient met all LTGs. Berg Balance score of 52/56 indicates <25% fall risk; Cognitive  TUG <10% increase indicates low fall risk when distracted; Functional Gait Assessment 26/30 indicates low fall risk. She is independent in HEP / fitness plan. Patient & husband report they are pleased with progress made in PT.   PT Next Visit Plan Discharge.   Consulted and Agree with Plan of Care Patient;Family member/caregiver   Family Member Consulted husband          G-Codes - 2015-02-06 1100    Functional Assessment Tool Used Functional Gait Assessment 26/30   Functional Limitation Mobility: Walking  and moving around   Mobility: Walking and Moving Around Goal Status [phone]) At least 20 percent but less than 40 percent impaired, limited or restricted   Mobility: Walking and Moving Around Discharge Status [phone]) At least 20 percent but less than 40 percent impaired, limited or restricted      Problem List Patient Active Problem List   Diagnosis Date Noted  . Monilial vaginitis 10/23/2014  . Deep vein thrombosis 08/12/2014  . History of inferior vena caval filter placement 07/14/2014  . Malignant glioma of brain 06/28/2014  . Diabetes mellitus, type 2 06/27/2014  . 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 Tremor of both hands 04/05/2014  . Vertigo 04/05/2014  . BPPV (benign paroxysmal positional vertigo) 03/14/2014  . Seizures 12/16/2013  . Epigastric pain 11/01/2013  . Fever blister 10/16/2013  . Hypothyroidism 12/13/2012  . Other and unspecified hyperlipidemia 12/13/2012  . Anxiety state, unspecified 12/13/2012  . Edema 12/13/2012  . Anxiety state 12/13/2012  . Adult hypothyroidism 12/13/2012  . Urinary incontinence   . DEPRESSION 09/24/2007  . Essential hypertension 09/24/2007  . GERD 09/24/2007  . HIATAL HERNIA 09/24/2007  . 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 WSFKCLEX(517.0) 09/24/2007    Yamato Kopf PT, DPT 02/06/2015, 6:39 AM  [facility] [address] [place], [place], [address] Phone: [phone]   Fax:  [phone]

## 2015-02-07 ENCOUNTER — Ambulatory Visit: Payer: Medicare Other | Admitting: Occupational Therapy

## 2015-02-07 ENCOUNTER — Encounter: Payer: Self-pay | Admitting: Occupational Therapy

## 2015-02-07 DIAGNOSIS — R482 Apraxia: Secondary | ICD-10-CM

## 2015-02-07 DIAGNOSIS — R29818 Other symptoms and signs involving the nervous system: Secondary | ICD-10-CM | POA: Diagnosis not present

## 2015-02-07 DIAGNOSIS — R29898 Other symptoms and signs involving the musculoskeletal system: Secondary | ICD-10-CM

## 2015-02-07 DIAGNOSIS — R4189 Other symptoms and signs involving cognitive functions and awareness: Secondary | ICD-10-CM

## 2015-02-07 DIAGNOSIS — R279 Unspecified lack of coordination: Secondary | ICD-10-CM

## 2015-02-07 DIAGNOSIS — G8191 Hemiplegia, unspecified affecting right dominant side: Secondary | ICD-10-CM

## 2015-02-07 NOTE — Therapy (Signed)
[facility] [address] [place], [place], [address] Phone: [phone]   Fax:  [phone]  Occupational Therapy Treatment  Patient Details  Name: Kelly Fletcher MRN: [id number] Date of Birth: 04-06-1951 Referring Provider:  Estill Dooms, MD  Encounter Date: 02/07/2015      OT End of Session - 02/07/15 1627    Visit Number 31   Number of Visits 69   Date for OT Re-Evaluation 03/08/15   Authorization Type MCR - G code needed   Authorization Time Period 60 Days - 04/02/2015   Authorization - Visit Number 3   Authorization - Number of Visits 80   OT Start Time 2878   OT Stop Time 1146   OT Time Calculation (min) 43 min   Activity Tolerance Patient tolerated treatment well      Past Medical History  Diagnosis Date  . Uterine prolapse   . Cystocele   . GERD (gastroesophageal reflux disease)   . Elevated cholesterol   . Rectocele   . Hypertension   . Arthritis   . Type II or unspecified type diabetes mellitus without mention of complication, uncontrolled     Type 2  . Urinary incontinence     Urgency  . Anxiety state, unspecified   . Palpitations   . Allergic rhinitis, cause unspecified   . Other and unspecified hyperlipidemia   . Unspecified hypothyroidism   . Edema   . Insomnia, unspecified   . Depressive disorder, not elsewhere classified   . Unspecified urinary incontinence   . Peptic ulcer, unspecified site, unspecified as acute or chronic, without mention of hemorrhage, perforation, or obstruction   . Disturbance of skin sensation   . Calculus of kidney   . Facial spasm 04/05/2014  . Abnormal brain MRI 04/13/2014  . 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 Monilial vaginitis 10/23/2014    Past Surgical History  Procedure Laterality Date  . Tonsillectomy and adenoidectomy  1963  . Dilation and curettage of uterus      3-05  .  Hysteroscopy      3-05  . Endometrial biopsy      Dr.Gottsegen   . Tongue biopsy      There were no vitals filed for this visit.  Visit Diagnosis:  Hemiplegia affecting right dominant side  Lack of coordination  Weakness of right hand  Cognitive deficits  Weakness of right arm  Apraxia      Subjective Assessment - 02/07/15 1106    Subjective  They were not able to get the filter out I have to go to [facility] to have it removed.   Pertinent History glioblastoma with brain surgery to remove tumor 06/28/14, facial seizures   Patient Stated Goals To get my Rt shoulder and hand better b/c I'm Rt handed   Currently in Pain? Yes   Pain Score 2    Pain Location Chest  from medical produre -    Pain Orientation Right   Pain Descriptors / Indicators Sore   Pain Type Acute pain   Pain Onset Yesterday   Aggravating Factors  just sore from the procedure   Pain Relieving Factors rest, positioning                      OT Treatments/Exercises (OP) - 02/07/15 0001    ADLs   Writing Pt able to print  first and last name with 100% legibility with a large marker today x3. Pt able to write first and last name with large pen with 80% legibility.  Pt with increased difficulty stablizing pen vs marker however goal is for pen so that pt can legally sign her name.    Neurological Re-education Exercises   Other Exercises 1 Neuro re ed to address grasp and release, reach and appropriate hand orienation for small objects of varying size - pt with moderate difficulty requiring mod facilitation and mod vc's. Pt continues to need cues to look at hand to use vision to compensate for apraxia and sensory loss.                   OT Short Term Goals - 02/05/15 1240    OT SHORT TERM GOAL #2   Title Pt to verbalize understanding with pain management strategies RUE (ongoing for renewal period - due 01/10/15)   Status Achieved   OT SHORT TERM GOAL #3   Title Pt to write name at 75% or  greater legibility w/ A/E for pen prn Rt dominant hand (due 01/10/15)   Baseline approx. 25% first name only with built up pen   Status Achieved   OT SHORT TERM GOAL #4   Title Improve RUE functional use as evidenced by performing 20 blocks or greater on Box & Blocks test   Baseline eval = 17, 12/04/14 = 12, 12/27/14 = 12   Status Not Met   OT SHORT TERM GOAL #5   Title Pt to improve grip strength Rt hand to 15 lbs or greater to decrease drops   Baseline eval = 10 lbs (Lt = 55 lbs); 12/04/14 = 10 lbs, 12/27/14 = 10 lbs   Status Not Met   OT SHORT TERM GOAL #7   Title Pt to use Rt dominant hand to feed [place] with finger foods 50% of the time or greater   Baseline none with Rt hand, using Lt non dominant hand   Status Achieved           OT Long Term Goals - 02/07/15 1623    OT LONG TERM GOAL #2   Baseline only 75% shoulder flexion   OT LONG TERM GOAL #3   Status --  will defer goal as pt has great difficulty with standardized tests due to cognitive, sensory and motor planning deficits however functional use of the RUE has greatly improved.    OT LONG TERM GOAL #4   Baseline eval: only able to place 1 peg in 60 sec.    Status --  defer goal as pt has great difficulty with standardized tests due to apraxia, sensory issues and cognitive impairment however RUE has greatly improved in functional use.    OT LONG TERM GOAL #5   Baseline eval = 10 lbs, 12/04/14 = 10 lbs   OT LONG TERM GOAL #6   Baseline only using LUE   OT LONG TERM GOAL #7   Baseline 25% legibility for first name only   OT LONG TERM GOAL #8   Baseline dependent   OT LONG TERM GOAL  #9   Baseline Pt will demonstrate ability to use RUE as domimant hand in at least 50% of IADL activities - 03/08/2015   Status New   OT LONG TERM GOAL  #10   TITLE Pt will demonstrate ability to correctly orient hand and pick up and release light objects of varying size and shape with no more  than min vc's   Status New   OT LONG TERM GOAL  #11    TITLE Pt will demonstrate ability to write first and last name with AE prn with 100% legibility   Status New   OT LONG TERM GOAL  #12   TITLE Pt willl be able to pick up light obejct off shelf at or above 95* of flexion without pain with no more than min compensations.   Status New               Plan - 02/07/15 1625    Clinical Impression Statement Pt now working on hand adjustments for hand orientatoin appropriate to different tasks, varying grasp positions, coordinated release and mid to high level reach with grasp and relase incoprorated. Pt continues to make slow steady progress - pt requires significant repetition and practice due to apraxia, sensory deficits and cognitive impairments.    Pt will benefit from skilled therapeutic intervention in order to improve on the following deficits (Retired) Decreased cognition;Decreased knowledge of use of DME;Impaired flexibility;Pain;Decreased coordination;Decreased mobility;Impaired sensation;Improper body mechanics;Decreased activity tolerance;Decreased endurance;Decreased range of motion;Decreased strength;Impaired tone;Decreased balance;Decreased knowledge of precautions;Decreased safety awareness;Impaired UE functional use;Impaired perceived functional ability   Rehab Potential Good   Clinical Impairments Affecting Rehab Potential apraxia, impaired sensation, impaired cogntion   OT Frequency 2x / week   OT Duration 4 weeks   OT Treatment/Interventions [place]-care/ADL training;Electrical Stimulation;Therapeutic exercise;Cognitive remediation/compensation;Moist Heat;Parrafin;Neuromuscular education;Splinting;Visual/perceptual remediation/compensation;Fluidtherapy;Energy conservation;Therapist, nutritional;Therapeutic exercises;Patient/family education;DME and/or AE instruction;Manual Therapy;Passive range of motion;Therapeutic activities   Plan NMR for mid to high reach as well as functional use of hand, writing   Consulted and Agree with  Plan of Care Patient;Family member/caregiver   Family Member Consulted HUSBAND        Problem List Patient Active Problem List   Diagnosis Date Noted  . Monilial vaginitis 10/23/2014  . Deep vein thrombosis 08/12/2014  . History of inferior vena caval filter placement 07/14/2014  . Malignant glioma of brain 06/28/2014  . Diabetes mellitus, type 2 06/27/2014  . 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 Tremor of both hands 04/05/2014  . Vertigo 04/05/2014  . BPPV (benign paroxysmal positional vertigo) 03/14/2014  . Seizures 12/16/2013  . Epigastric pain 11/01/2013  . Fever blister 10/16/2013  . Hypothyroidism 12/13/2012  . Other and unspecified hyperlipidemia 12/13/2012  . Anxiety state, unspecified 12/13/2012  . Edema 12/13/2012  . Anxiety state 12/13/2012  . Adult hypothyroidism 12/13/2012  . Urinary incontinence   . DEPRESSION 09/24/2007  . Essential hypertension 09/24/2007  . GERD 09/24/2007  . HIATAL HERNIA 09/24/2007  . 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 Headache(784.0) 09/24/2007    Quay Burow, OTR/L 02/07/2015, 4:29 PM  [facility] [address] [place], [place], [address] Phone: [phone]   Fax:  [phone]

## 2015-02-11 ENCOUNTER — Ambulatory Visit: Payer: Medicare Other | Admitting: Occupational Therapy

## 2015-02-11 ENCOUNTER — Encounter: Payer: Self-pay | Admitting: Occupational Therapy

## 2015-02-11 DIAGNOSIS — R482 Apraxia: Secondary | ICD-10-CM

## 2015-02-11 DIAGNOSIS — R4189 Other symptoms and signs involving cognitive functions and awareness: Secondary | ICD-10-CM

## 2015-02-11 DIAGNOSIS — M25511 Pain in right shoulder: Secondary | ICD-10-CM

## 2015-02-11 DIAGNOSIS — G8191 Hemiplegia, unspecified affecting right dominant side: Secondary | ICD-10-CM

## 2015-02-11 DIAGNOSIS — R29818 Other symptoms and signs involving the nervous system: Secondary | ICD-10-CM | POA: Diagnosis not present

## 2015-02-11 DIAGNOSIS — R29898 Other symptoms and signs involving the musculoskeletal system: Secondary | ICD-10-CM

## 2015-02-11 NOTE — Therapy (Signed)
[facility] [address] [place], [place], [address] Phone: [phone]   Fax:  [phone]  Occupational Therapy Treatment  Patient Details  Name: Kelly Fletcher MRN: [id number] Date of Birth: 1951-02-23 Referring Provider:  Estill Dooms, MD  Encounter Date: 02/11/2015      OT End of Session - 02/11/15 1248    Visit Number 32   Number of Visits 35   Date for OT Re-Evaluation 03/08/15   Authorization Type MCR - G code needed   Authorization Time Period 60 Days - 04/02/2015   OT Start Time 1101   OT Stop Time 1145   OT Time Calculation (min) 44 min   Activity Tolerance Patient tolerated treatment well      Past Medical History  Diagnosis Date  . Uterine prolapse   . Cystocele   . GERD (gastroesophageal reflux disease)   . Elevated cholesterol   . Rectocele   . Hypertension   . Arthritis   . Type II or unspecified type diabetes mellitus without mention of complication, uncontrolled     Type 2  . Urinary incontinence     Urgency  . Anxiety state, unspecified   . Palpitations   . Allergic rhinitis, cause unspecified   . Other and unspecified hyperlipidemia   . Unspecified hypothyroidism   . Edema   . Insomnia, unspecified   . Depressive disorder, not elsewhere classified   . Unspecified urinary incontinence   . Peptic ulcer, unspecified site, unspecified as acute or chronic, without mention of hemorrhage, perforation, or obstruction   . Disturbance of skin sensation   . Calculus of kidney   . Facial spasm 04/05/2014  . Abnormal brain MRI 04/13/2014  . 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 Monilial vaginitis 10/23/2014    Past Surgical History  Procedure Laterality Date  . Tonsillectomy and adenoidectomy  1963  . Dilation and curettage of uterus      3-05  . Hysteroscopy      3-05  . Endometrial biopsy      Dr.Gottsegen   .  Tongue biopsy      There were no vitals filed for this visit.  Visit Diagnosis:  Hemiplegia affecting right dominant side  Weakness of right hand  Cognitive deficits  Weakness of right arm  Apraxia  Pain in joint, shoulder region, right      Subjective Assessment - 02/11/15 1108    Subjective  (p) I restarted my chemo last friday - oral med - 5 days, then 3 days off   Patient is accompained by: (p) Family member  husband   Pertinent History (p) glioblastoma with brain surgery to remove tumor 06/28/14, facial seizures   Patient Stated Goals (p) To get my Rt shoulder and hand better b/c I'm Rt handed   Currently in Pain? (p) Yes   Pain Score (p) 1    Pain Location (p) Chest  from procedure   Pain Orientation (p) Right   Pain Descriptors / Indicators (p) Sore   Pain Type (p) Acute pain   Pain Onset (p) In the past 7 days   Pain Frequency (p) Intermittent   Aggravating Factors  (p) much better just a little sore today   Pain Relieving Factors (p) rest, positioning   Multiple Pain Sites (p) No  OT Treatments/Exercises (OP) - 02/11/15 0001    Neurological Re-education Exercises   Other Exercises 1 Neuro re ed with UE ranger to address mid to high reach in closed chain activity with resistance as well as emphasis on moving from 140* of shoulder flexion into horizontal abduction and then returning to forward reach. Pt limited in ER and continues with tight capsule therefore also addresed AAROM with resistance to improve ER and scapular adduction with resistance.                    OT Short Term Goals - 02/05/15 1240    OT SHORT TERM GOAL #2   Title Pt to verbalize understanding with pain management strategies RUE (ongoing for renewal period - due 01/10/15)   Status Achieved   OT SHORT TERM GOAL #3   Title Pt to write name at 75% or greater legibility w/ A/E for pen prn Rt dominant hand (due 01/10/15)   Baseline approx. 25% first  name only with built up pen   Status Achieved   OT SHORT TERM GOAL #4   Title Improve RUE functional use as evidenced by performing 20 blocks or greater on Box & Blocks test   Baseline eval = 17, 12/04/14 = 12, 12/27/14 = 12   Status Not Met   OT SHORT TERM GOAL #5   Title Pt to improve grip strength Rt hand to 15 lbs or greater to decrease drops   Baseline eval = 10 lbs (Lt = 55 lbs); 12/04/14 = 10 lbs, 12/27/14 = 10 lbs   Status Not Met   OT SHORT TERM GOAL #7   Title Pt to use Rt dominant hand to feed [place] with finger foods 50% of the time or greater   Baseline none with Rt hand, using Lt non dominant hand   Status Achieved           OT Long Term Goals - 02/11/15 1246    OT LONG TERM GOAL #2   Baseline only 75% shoulder flexion   OT LONG TERM GOAL #3   Status --  will defer goal as pt has great difficulty with standardized tests due to cognitive, sensory and motor planning deficits however functional use of the RUE has greatly improved.    OT LONG TERM GOAL #4   Baseline eval: only able to place 1 peg in 60 sec.    Status --  defer goal as pt has great difficulty with standardized tests due to apraxia, sensory issues and cognitive impairment however RUE has greatly improved in functional use.    OT LONG TERM GOAL #5   Baseline eval = 10 lbs, 12/04/14 = 10 lbs   OT LONG TERM GOAL #6   Baseline only using LUE   OT LONG TERM GOAL #7   Baseline 25% legibility for first name only   OT LONG TERM GOAL #8   Baseline dependent   OT LONG TERM GOAL  #9   Baseline Pt will demonstrate ability to use RUE as domimant hand in at least 50% of IADL activities - 03/08/2015   Status On-going   OT LONG TERM GOAL  #10   TITLE Pt will demonstrate ability to correctly orient hand and pick up and release light objects of varying size and shape with no more than min vc's   Status On-going   OT LONG TERM GOAL  #11   TITLE Pt will demonstrate ability to write first and last name with AE  prn with 100%  legibility   Status On-going   OT LONG TERM GOAL  #12   TITLE Pt willl be able to pick up light obejct off shelf at or above 95* of flexion without pain with no more than min compensations.   Status On-going               Plan - 02/11/15 1247    Clinical Impression Statement Pt making gains toward higher level reach and scapular stability with high reach as well as stable mobility with higher reach.  Pt started chemo again on Friday and reports some fatigue.   Pt will benefit from skilled therapeutic intervention in order to improve on the following deficits (Retired) Decreased cognition;Decreased knowledge of use of DME;Impaired flexibility;Pain;Decreased coordination;Decreased mobility;Impaired sensation;Improper body mechanics;Decreased activity tolerance;Decreased endurance;Decreased range of motion;Decreased strength;Impaired tone;Decreased balance;Decreased knowledge of precautions;Decreased safety awareness;Impaired UE functional use;Impaired perceived functional ability   Rehab Potential Good   Clinical Impairments Affecting Rehab Potential apraxia, impaired sensation, impaired cogntion   OT Frequency 2x / week   OT Duration 4 weeks   OT Treatment/Interventions [place]-care/ADL training;Electrical Stimulation;Therapeutic exercise;Cognitive remediation/compensation;Moist Heat;Parrafin;Neuromuscular education;Splinting;Visual/perceptual remediation/compensation;Fluidtherapy;Energy conservation;Therapist, nutritional;Therapeutic exercises;Patient/family education;DME and/or AE instruction;Manual Therapy;Passive range of motion;Therapeutic activities   Plan NMR fo  mid to high reach, functional use of hand, writing   Consulted and Agree with Plan of Care Patient;Family member/caregiver   Family Member Consulted HUSBAND        Problem List Patient Active Problem List   Diagnosis Date Noted  . Monilial vaginitis 10/23/2014  . Deep vein thrombosis 08/12/2014  . History of  inferior vena caval filter placement 07/14/2014  . Malignant glioma of brain 06/28/2014  . Diabetes mellitus, type 2 06/27/2014  . 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 Tremor of both hands 04/05/2014  . Vertigo 04/05/2014  . BPPV (benign paroxysmal positional vertigo) 03/14/2014  . Seizures 12/16/2013  . Epigastric pain 11/01/2013  . Fever blister 10/16/2013  . Hypothyroidism 12/13/2012  . Other and unspecified hyperlipidemia 12/13/2012  . Anxiety state, unspecified 12/13/2012  . Edema 12/13/2012  . Anxiety state 12/13/2012  . Adult hypothyroidism 12/13/2012  . Urinary incontinence   . DEPRESSION 09/24/2007  . Essential hypertension 09/24/2007  . GERD 09/24/2007  . HIATAL HERNIA 09/24/2007  . 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 Headache(784.0) 09/24/2007    Quay Burow, OTR/L 02/11/2015, 12:50 PM  [facility] [address] [place], [place], [address] Phone: [phone]   Fax:  [phone]

## 2015-02-12 ENCOUNTER — Telehealth: Payer: Self-pay | Admitting: Oncology

## 2015-02-12 ENCOUNTER — Encounter: Payer: Medicare Other | Admitting: Occupational Therapy

## 2015-02-12 ENCOUNTER — Ambulatory Visit: Payer: Medicare Other | Admitting: Physical Therapy

## 2015-02-12 NOTE — Telephone Encounter (Signed)
Pt's husband Saralyn Pilar lft msg confirming labs for pt, advised him I would have him an updated schedule when pt comes in for labs.... KJ

## 2015-02-13 ENCOUNTER — Other Ambulatory Visit: Payer: Self-pay | Admitting: Internal Medicine

## 2015-02-14 ENCOUNTER — Ambulatory Visit: Payer: Medicare Other | Admitting: Occupational Therapy

## 2015-02-14 ENCOUNTER — Other Ambulatory Visit (HOSPITAL_BASED_OUTPATIENT_CLINIC_OR_DEPARTMENT_OTHER): Payer: Medicare Other

## 2015-02-14 ENCOUNTER — Encounter: Payer: Self-pay | Admitting: Occupational Therapy

## 2015-02-14 ENCOUNTER — Telehealth: Payer: Self-pay | Admitting: *Deleted

## 2015-02-14 DIAGNOSIS — G8191 Hemiplegia, unspecified affecting right dominant side: Secondary | ICD-10-CM

## 2015-02-14 DIAGNOSIS — R29898 Other symptoms and signs involving the musculoskeletal system: Secondary | ICD-10-CM

## 2015-02-14 DIAGNOSIS — R4189 Other symptoms and signs involving cognitive functions and awareness: Secondary | ICD-10-CM

## 2015-02-14 DIAGNOSIS — R279 Unspecified lack of coordination: Secondary | ICD-10-CM

## 2015-02-14 DIAGNOSIS — C719 Malignant neoplasm of brain, unspecified: Secondary | ICD-10-CM

## 2015-02-14 DIAGNOSIS — C711 Malignant neoplasm of frontal lobe: Secondary | ICD-10-CM | POA: Diagnosis not present

## 2015-02-14 DIAGNOSIS — R482 Apraxia: Secondary | ICD-10-CM

## 2015-02-14 DIAGNOSIS — R29818 Other symptoms and signs involving the nervous system: Secondary | ICD-10-CM | POA: Diagnosis not present

## 2015-02-14 LAB — COMPREHENSIVE METABOLIC PANEL (CC13)
ALBUMIN: 3.8 g/dL (ref 3.5–5.0)
ALK PHOS: 58 U/L (ref 40–150)
ALT: 18 U/L (ref 0–55)
AST: 14 U/L (ref 5–34)
Anion Gap: 6 mEq/L (ref 3–11)
BUN: 13.8 mg/dL (ref 7.0–26.0)
CO2: 29 mEq/L (ref 22–29)
Calcium: 9.4 mg/dL (ref 8.4–10.4)
Chloride: 107 mEq/L (ref 98–109)
Creatinine: 0.8 mg/dL (ref 0.6–1.1)
EGFR: 80 mL/min/{1.73_m2} — ABNORMAL LOW (ref >90)
GLUCOSE: 109 mg/dL (ref 70–140)
POTASSIUM: 4.6 meq/L (ref 3.5–5.1)
SODIUM: 141 meq/L (ref 136–145)
Total Bilirubin: 0.47 mg/dL (ref 0.20–1.20)
Total Protein: 5.9 g/dL — ABNORMAL LOW (ref 6.4–8.3)

## 2015-02-14 LAB — CBC WITH DIFFERENTIAL/PLATELET
BASO%: 0.4 % (ref 0.0–2.0)
BASOS ABS: 0 10*3/uL (ref 0.0–0.1)
EOS%: 2.2 % (ref 0.0–7.0)
Eosinophils Absolute: 0.1 10*3/uL (ref 0.0–0.5)
HCT: 35.2 % (ref 34.8–46.6)
HEMOGLOBIN: 12.1 g/dL (ref 11.6–15.9)
LYMPH%: 14.7 % (ref 14.0–49.7)
MCH: 34.6 pg — AB (ref 25.1–34.0)
MCHC: 34.4 g/dL (ref 31.5–36.0)
MCV: 100.6 fL (ref 79.5–101.0)
MONO#: 0.5 10*3/uL (ref 0.1–0.9)
MONO%: 8.8 % (ref 0.0–14.0)
NEUT%: 73.9 % (ref 38.4–76.8)
NEUTROS ABS: 4 10*3/uL (ref 1.5–6.5)
Platelets: 138 10*3/uL — ABNORMAL LOW (ref 145–400)
RBC: 3.5 10*6/uL — ABNORMAL LOW (ref 3.70–5.45)
RDW: 13.5 % (ref 11.2–14.5)
WBC: 5.4 10*3/uL (ref 3.9–10.3)
lymph#: 0.8 10*3/uL — ABNORMAL LOW (ref 0.9–3.3)

## 2015-02-14 NOTE — Telephone Encounter (Signed)
-----   Message from Ladell Pier, MD sent at 02/14/2015 12:18 PM EDT ----- Please call patient, cbc is ok, repeat 1 week

## 2015-02-14 NOTE — Therapy (Signed)
[facility] [address] [place], [place], [address] Phone: [phone]   Fax:  [phone]  Occupational Therapy Treatment  Patient Details  Name: Kelly Fletcher MRN: [id number] Date of Birth: 01/02/1951 Referring Provider:  Estill Dooms, MD  Encounter Date: 02/14/2015      OT End of Session - 02/14/15 1736    Visit Number 33   Number of Visits 108   Date for OT Re-Evaluation 03/08/15   Authorization Type MCR - G code needed   Authorization Time Period 60 Days - 04/02/2015   OT Start Time 1532   OT Stop Time 1615   OT Time Calculation (min) 43 min   Activity Tolerance Patient tolerated treatment well      Past Medical History  Diagnosis Date  . Uterine prolapse   . Cystocele   . GERD (gastroesophageal reflux disease)   . Elevated cholesterol   . Rectocele   . Hypertension   . Arthritis   . Type II or unspecified type diabetes mellitus without mention of complication, uncontrolled     Type 2  . Urinary incontinence     Urgency  . Anxiety state, unspecified   . Palpitations   . Allergic rhinitis, cause unspecified   . Other and unspecified hyperlipidemia   . Unspecified hypothyroidism   . Edema   . Insomnia, unspecified   . Depressive disorder, not elsewhere classified   . Unspecified urinary incontinence   . Peptic ulcer, unspecified site, unspecified as acute or chronic, without mention of hemorrhage, perforation, or obstruction   . Disturbance of skin sensation   . Calculus of kidney   . Facial spasm 04/05/2014  . Abnormal brain MRI 04/13/2014  . 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 Monilial vaginitis 10/23/2014    Past Surgical History  Procedure Laterality Date  . Tonsillectomy and adenoidectomy  1963  . Dilation and curettage of uterus      3-05  . Hysteroscopy      3-05  . Endometrial biopsy      Dr.Gottsegen   .  Tongue biopsy      There were no vitals filed for this visit.  Visit Diagnosis:  Hemiplegia affecting right dominant side  Weakness of right hand  Cognitive deficits  Weakness of right arm  Apraxia  Lack of coordination      Subjective Assessment - 02/14/15 1536    Subjective  I am very tired and my hand hurts - I have been using it a lot more   Patient is accompained by: Family member  husband   Pertinent History glioblastoma with brain surgery to remove tumor 06/28/14, facial seizures   Patient Stated Goals To get my Rt shoulder and hand better b/c I'm Rt handed   Currently in Pain? Yes   Pain Score 8    Pain Location Hand   Pain Orientation Right   Pain Descriptors / Indicators Aching   Pain Type Chronic pain   Pain Onset More than a month ago   Pain Frequency Constant   Aggravating Factors  I think I have been using it alot and that is why it is sore   Pain Relieving Factors rest. ice                      OT Treatments/Exercises (OP) - 02/14/15 0001    Neurological Re-education  Exercises   Other Exercises 1 Neuro re ed to addres grasp, release, 2 point and 3 point pinch, and incorporation of mid to begining high reach with therapeutic activities.  Pt c/o of pain in stiffness in R hand and feels it may be from overuse.  Have contacted pt's oncology practice to obtain permission to use fluidotherapy - left message.  Pt aware.     Modalities   Modalities --  ice to hand at end of session to address pain   Cryotherapy   Number Minutes Cryotherapy 5 Minutes   Manual Therapy   Manual Therapy Joint mobilization;Soft tissue mobilization   Manual therapy comments Pt with signficant pain and soreness in R hand today - unable to identify cause other than possible overuse.  Joint mob and soft tissue mob to address tightness, stiffness  and pain in hand with minimal effect.                   OT Short Term Goals - 02/14/15 1734    OT SHORT TERM GOAL  #2   Title Pt to verbalize understanding with pain management strategies RUE (ongoing for renewal period - due 01/10/15)   Status Achieved   OT SHORT TERM GOAL #3   Title Pt to write name at 75% or greater legibility w/ A/E for pen prn Rt dominant hand (due 01/10/15)   Baseline approx. 25% first name only with built up pen   Status Achieved   OT SHORT TERM GOAL #4   Title Improve RUE functional use as evidenced by performing 20 blocks or greater on Box & Blocks test   Baseline eval = 17, 12/04/14 = 12, 12/27/14 = 12   Status Not Met   OT SHORT TERM GOAL #5   Title Pt to improve grip strength Rt hand to 15 lbs or greater to decrease drops   Baseline eval = 10 lbs (Lt = 55 lbs); 12/04/14 = 10 lbs, 12/27/14 = 10 lbs   Status Not Met   OT SHORT TERM GOAL #7   Title Pt to use Rt dominant hand to feed [place] with finger foods 50% of the time or greater   Baseline none with Rt hand, using Lt non dominant hand   Status Achieved           OT Long Term Goals - 02/14/15 1734    OT LONG TERM GOAL #2   Baseline only 75% shoulder flexion   OT LONG TERM GOAL #3   Status --  will defer goal as pt has great difficulty with standardized tests due to cognitive, sensory and motor planning deficits however functional use of the RUE has greatly improved.    OT LONG TERM GOAL #4   Baseline eval: only able to place 1 peg in 60 sec.    Status --  defer goal as pt has great difficulty with standardized tests due to apraxia, sensory issues and cognitive impairment however RUE has greatly improved in functional use.    OT LONG TERM GOAL #5   Baseline eval = 10 lbs, 12/04/14 = 10 lbs   OT LONG TERM GOAL #6   Baseline only using LUE   OT LONG TERM GOAL #7   Baseline 25% legibility for first name only   OT LONG TERM GOAL #8   Baseline dependent   OT LONG TERM GOAL  #9   Baseline Pt will demonstrate ability to use RUE as domimant hand in at least 50% of IADL  activities - 03/08/2015   Status On-going   OT LONG TERM  GOAL  #10   TITLE Pt will demonstrate ability to correctly orient hand and pick up and release light objects of varying size and shape with no more than min vc's   Status On-going   OT LONG TERM GOAL  #11   TITLE Pt will demonstrate ability to write first and last name with AE prn with 100% legibility   Status On-going   OT LONG TERM GOAL  #12   TITLE Pt willl be able to pick up light obejct off shelf at or above 95* of flexion without pain with no more than min compensations.   Status On-going               Plan - 02/14/15 1734    Clinical Impression Statement Pt has resumed chemo and with signficantly greater fatigue however remains very motivated to improve use of R hand.  Encouraged pt to balance need for rest with HEP.    Pt will benefit from skilled therapeutic intervention in order to improve on the following deficits (Retired) Decreased cognition;Decreased knowledge of use of DME;Impaired flexibility;Pain;Decreased coordination;Decreased mobility;Impaired sensation;Improper body mechanics;Decreased activity tolerance;Decreased endurance;Decreased range of motion;Decreased strength;Impaired tone;Decreased balance;Decreased knowledge of precautions;Decreased safety awareness;Impaired UE functional use;Impaired perceived functional ability   Rehab Potential Good   Clinical Impairments Affecting Rehab Potential apraxia, impaired sensation, impaired cogntion   OT Frequency 2x / week   OT Duration 4 weeks   OT Treatment/Interventions Surprenant-care/ADL training;Electrical Stimulation;Therapeutic exercise;Cognitive remediation/compensation;Moist Heat;Parrafin;Neuromuscular education;Splinting;Visual/perceptual remediation/compensation;Fluidtherapy;Energy conservation;Therapist, nutritional;Therapeutic exercises;Patient/family education;DME and/or AE instruction;Manual Therapy;Passive range of motion;Therapeutic activities   Plan NMR for mid to high reach, functional use of hand, writing    Consulted and Agree with Plan of Care Patient;Family member/caregiver   Family Member Consulted HUSBAND        Problem List Patient Active Problem List   Diagnosis Date Noted  . Monilial vaginitis 10/23/2014  . Deep vein thrombosis 08/12/2014  . History of inferior vena caval filter placement 07/14/2014  . Malignant glioma of brain 06/28/2014  . Diabetes mellitus, type 2 06/27/2014  . 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 Tremor of both hands 04/05/2014  . Vertigo 04/05/2014  . BPPV (benign paroxysmal positional vertigo) 03/14/2014  . Seizures 12/16/2013  . Epigastric pain 11/01/2013  . Fever blister 10/16/2013  . Hypothyroidism 12/13/2012  . Other and unspecified hyperlipidemia 12/13/2012  . Anxiety state, unspecified 12/13/2012  . Edema 12/13/2012  . Anxiety state 12/13/2012  . Adult hypothyroidism 12/13/2012  . Urinary incontinence   . DEPRESSION 09/24/2007  . Essential hypertension 09/24/2007  . GERD 09/24/2007  . HIATAL HERNIA 09/24/2007  . 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 Headache(784.0) 09/24/2007    Quay Burow, OTR/L 02/14/2015, 5:38 PM  [facility] [address] [place], [place], [address] Phone: [phone]   Fax:  [phone]

## 2015-02-14 NOTE — Telephone Encounter (Signed)
Per Dr. Benay Spice; notified pt's husband that cbc is ok, repeat in 1 week.  Pt's husband verbalized understanding and requested results be sent to Dr. Veryl Speak.  Informed pt that labs will be faxed to [facility] and confirmed next lab 02/21/15.

## 2015-02-18 ENCOUNTER — Ambulatory Visit: Payer: Medicare Other | Admitting: Oncology

## 2015-02-19 ENCOUNTER — Ambulatory Visit: Payer: Medicare Other | Admitting: Occupational Therapy

## 2015-02-19 ENCOUNTER — Encounter: Payer: Self-pay | Admitting: Occupational Therapy

## 2015-02-19 DIAGNOSIS — G8191 Hemiplegia, unspecified affecting right dominant side: Secondary | ICD-10-CM

## 2015-02-19 DIAGNOSIS — R29818 Other symptoms and signs involving the nervous system: Secondary | ICD-10-CM | POA: Diagnosis not present

## 2015-02-19 DIAGNOSIS — R482 Apraxia: Secondary | ICD-10-CM

## 2015-02-19 DIAGNOSIS — R29898 Other symptoms and signs involving the musculoskeletal system: Secondary | ICD-10-CM

## 2015-02-19 DIAGNOSIS — M25511 Pain in right shoulder: Secondary | ICD-10-CM

## 2015-02-19 DIAGNOSIS — R4189 Other symptoms and signs involving cognitive functions and awareness: Secondary | ICD-10-CM

## 2015-02-19 DIAGNOSIS — R279 Unspecified lack of coordination: Secondary | ICD-10-CM

## 2015-02-19 NOTE — Therapy (Signed)
[facility] [address] [place], [place], [address] Phone: [phone]   Fax:  [phone]  Occupational Therapy Treatment  Patient Details  Name: Kelly Fletcher MRN: [id number] Date of Birth: [birth year] Referring Provider:  Estill Dooms, MD  Encounter Date: 02/19/2015      OT End of Session - 02/19/15 1242    Visit Number 34   Number of Visits 42   Date for OT Re-Evaluation 03/08/15   Authorization Type MCR - G code needed   Authorization Time Period 60 Days - 04/02/2015   OT Start Time 1147   OT Stop Time 1230   OT Time Calculation (min) 43 min   Activity Tolerance Patient tolerated treatment well      Past Medical History  Diagnosis Date  . Uterine prolapse   . Cystocele   . GERD (gastroesophageal reflux disease)   . Elevated cholesterol   . Rectocele   . Hypertension   . Arthritis   . Type II or unspecified type diabetes mellitus without mention of complication, uncontrolled     Type 2  . Urinary incontinence     Urgency  . Anxiety state, unspecified   . Palpitations   . Allergic rhinitis, cause unspecified   . Other and unspecified hyperlipidemia   . Unspecified hypothyroidism   . Edema   . Insomnia, unspecified   . Depressive disorder, not elsewhere classified   . Unspecified urinary incontinence   . Peptic ulcer, unspecified site, unspecified as acute or chronic, without mention of hemorrhage, perforation, or obstruction   . Disturbance of skin sensation   . Calculus of kidney   . Facial spasm 04/05/2014  . Abnormal brain MRI 04/13/2014  . 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 Monilial vaginitis 10/23/2014    Past Surgical History  Procedure Laterality Date  . Tonsillectomy and adenoidectomy  1963  . Dilation and curettage of uterus      3-05  . Hysteroscopy      3-05  . Endometrial biopsy      Dr.Gottsegen   .  Tongue biopsy      There were no vitals filed for this visit.  Visit Diagnosis:  Hemiplegia affecting right dominant side  Weakness of right hand  Cognitive deficits  Apraxia  Lack of coordination  Pain in joint, shoulder region, right      Subjective Assessment - 02/19/15 1151    Subjective  I am still prettty tired but my hand is not as sore   Patient is accompained by: Family member  husband   Pertinent History glioblastoma with brain surgery to remove tumor 06/28/14, facial seizures   Patient Stated Goals To get my Rt shoulder and hand better b/c I'm Rt handed   Currently in Pain? Yes   Pain Score 5    Pain Location Hand   Pain Orientation Right   Pain Descriptors / Indicators Aching   Pain Type Chronic pain   Pain Onset More than a month ago   Pain Frequency Constant   Aggravating Factors  overuse - pt states she has had this pain off and on since her tumor was removed   Pain Relieving Factors rest, ice                      OT Treatments/Exercises (OP) - 02/19/15 0001    Neurological Re-education Exercises  Other Exercises 1 Neuro re ed with UE ranger against wall to work on high reach as well as moving from high reach in midline to abduction for side reaching with closed chain activity - pt needed min vc's and intermittent min facilitation for more normal movement patterns.  Pt with improved control.  Progressed to bilateral overhead reach with ball - pt able to actively achieve 110* with min compensations and able to achieve 150* with min facilitation.  Progresesd to unilateral reach and pt able to achieve light object off shelf at 100* shoulder flexion. Pt also able to lift one pound object off shelf.                     OT Short Term Goals - 02/19/15 1239    OT SHORT TERM GOAL #2   Title Pt to verbalize understanding with pain management strategies RUE (ongoing for renewal period - due 01/10/15)   Status Achieved   OT SHORT TERM GOAL #3    Title Pt to write name at 75% or greater legibility w/ A/E for pen prn Rt dominant hand (due 01/10/15)   Baseline approx. 25% first name only with built up pen   Status Achieved   OT SHORT TERM GOAL #4   Title Improve RUE functional use as evidenced by performing 20 blocks or greater on Box & Blocks test   Baseline eval = 17, 12/04/14 = 12, 12/27/14 = 12   Status Not Met   OT SHORT TERM GOAL #5   Title Pt to improve grip strength Rt hand to 15 lbs or greater to decrease drops   Baseline eval = 10 lbs (Lt = 55 lbs); 12/04/14 = 10 lbs, 12/27/14 = 10 lbs   Status Not Met   OT SHORT TERM GOAL #7   Title Pt to use Rt dominant hand to feed [place] with finger foods 50% of the time or greater   Baseline none with Rt hand, using Lt non dominant hand   Status Achieved           OT Long Term Goals - 02/19/15 1240    OT LONG TERM GOAL #2   Baseline only 75% shoulder flexion   OT LONG TERM GOAL #3   Status --  will defer goal as pt has great difficulty with standardized tests due to cognitive, sensory and motor planning deficits however functional use of the RUE has greatly improved.    OT LONG TERM GOAL #4   Baseline eval: only able to place 1 peg in 60 sec.    Status --  defer goal as pt has great difficulty with standardized tests due to apraxia, sensory issues and cognitive impairment however RUE has greatly improved in functional use.    OT LONG TERM GOAL #5   Baseline eval = 10 lbs, 12/04/14 = 10 lbs   OT LONG TERM GOAL #6   Baseline only using LUE   OT LONG TERM GOAL #7   Baseline 25% legibility for first name only   OT LONG TERM GOAL #8   Baseline dependent   OT LONG TERM GOAL  #9   Baseline Pt will demonstrate ability to use RUE as domimant hand in at least 50% of IADL activities - 03/08/2015   Status On-going   OT LONG TERM GOAL  #10   TITLE Pt will demonstrate ability to correctly orient hand and pick up and release light objects of varying size and shape with no more  than min vc's    Status On-going   OT LONG TERM GOAL  #11   TITLE Pt will demonstrate ability to write first and last name with AE prn with 100% legibility   Status On-going   OT LONG TERM GOAL  #12   TITLE Pt willl be able to pick up light obejct off shelf at or above 95* of flexion without pain with no more than min compensations.   Status Achieved               Plan - 02/19/15 1240    Clinical Impression Statement Pt continues to make slow steady progress toward goals despite numerous deficit areas and restarting chemo treatment. Pt with improved reach and functional use of RUE.   Pt will benefit from skilled therapeutic intervention in order to improve on the following deficits (Retired) Decreased cognition;Decreased knowledge of use of DME;Impaired flexibility;Pain;Decreased coordination;Decreased mobility;Impaired sensation;Improper body mechanics;Decreased activity tolerance;Decreased endurance;Decreased range of motion;Decreased strength;Impaired tone;Decreased balance;Decreased knowledge of precautions;Decreased safety awareness;Impaired UE functional use;Impaired perceived functional ability   Rehab Potential Good   Clinical Impairments Affecting Rehab Potential apraxia, impaired sensation, impaired cogntion   OT Frequency 2x / week   OT Duration 4 weeks   OT Treatment/Interventions [place]-care/ADL training;Electrical Stimulation;Therapeutic exercise;Cognitive remediation/compensation;Moist Heat;Parrafin;Neuromuscular education;Splinting;Visual/perceptual remediation/compensation;Fluidtherapy;Energy conservation;Therapist, nutritional;Therapeutic exercises;Patient/family education;DME and/or AE instruction;Manual Therapy;Passive range of motion;Therapeutic activities   Plan cont NMR for hight reach, functional use of hand, strengthening of RUE in high reach, writing   Consulted and Agree with Plan of Care Patient;Family member/caregiver   Family Member Consulted HUSBAND        Problem  List Patient Active Problem List   Diagnosis Date Noted  . Monilial vaginitis 10/23/2014  . Deep vein thrombosis 08/12/2014  . History of inferior vena caval filter placement 07/14/2014  . Malignant glioma of brain 06/28/2014  . Diabetes mellitus, type 2 06/27/2014  . 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 Tremor of both hands 04/05/2014  . Vertigo 04/05/2014  . BPPV (benign paroxysmal positional vertigo) 03/14/2014  . Seizures 12/16/2013  . Epigastric pain 11/01/2013  . Fever blister 10/16/2013  . Hypothyroidism 12/13/2012  . Other and unspecified hyperlipidemia 12/13/2012  . Anxiety state, unspecified 12/13/2012  . Edema 12/13/2012  . Anxiety state 12/13/2012  . Adult hypothyroidism 12/13/2012  . Urinary incontinence   . DEPRESSION 09/24/2007  . Essential hypertension 09/24/2007  . GERD 09/24/2007  . HIATAL HERNIA 09/24/2007  . 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 Headache(784.0) 09/24/2007    Quay Burow, OTR/L 02/19/2015, 12:43 PM  [facility] [address] [place], [place], [address] Phone: [phone]   Fax:  [phone]

## 2015-02-21 ENCOUNTER — Other Ambulatory Visit (HOSPITAL_BASED_OUTPATIENT_CLINIC_OR_DEPARTMENT_OTHER): Payer: Medicare Other

## 2015-02-21 ENCOUNTER — Ambulatory Visit: Payer: Medicare Other | Admitting: Occupational Therapy

## 2015-02-21 ENCOUNTER — Encounter: Payer: Self-pay | Admitting: Occupational Therapy

## 2015-02-21 DIAGNOSIS — C711 Malignant neoplasm of frontal lobe: Secondary | ICD-10-CM | POA: Diagnosis not present

## 2015-02-21 DIAGNOSIS — R279 Unspecified lack of coordination: Secondary | ICD-10-CM

## 2015-02-21 DIAGNOSIS — R29818 Other symptoms and signs involving the nervous system: Secondary | ICD-10-CM | POA: Diagnosis not present

## 2015-02-21 DIAGNOSIS — M25511 Pain in right shoulder: Secondary | ICD-10-CM

## 2015-02-21 DIAGNOSIS — G8191 Hemiplegia, unspecified affecting right dominant side: Secondary | ICD-10-CM

## 2015-02-21 DIAGNOSIS — R482 Apraxia: Secondary | ICD-10-CM

## 2015-02-21 DIAGNOSIS — R29898 Other symptoms and signs involving the musculoskeletal system: Secondary | ICD-10-CM

## 2015-02-21 DIAGNOSIS — R4189 Other symptoms and signs involving cognitive functions and awareness: Secondary | ICD-10-CM

## 2015-02-21 DIAGNOSIS — C719 Malignant neoplasm of brain, unspecified: Secondary | ICD-10-CM

## 2015-02-21 LAB — CBC WITH DIFFERENTIAL/PLATELET
BASO%: 0.1 % (ref 0.0–2.0)
Basophils Absolute: 0 10*3/uL (ref 0.0–0.1)
EOS%: 1.8 % (ref 0.0–7.0)
Eosinophils Absolute: 0.1 10*3/uL (ref 0.0–0.5)
HCT: 35.9 % (ref 34.8–46.6)
HGB: 12.2 g/dL (ref 11.6–15.9)
LYMPH%: 10.2 % — AB (ref 14.0–49.7)
MCH: 34.2 pg — ABNORMAL HIGH (ref 25.1–34.0)
MCHC: 34 g/dL (ref 31.5–36.0)
MCV: 100.6 fL (ref 79.5–101.0)
MONO#: 0.6 10*3/uL (ref 0.1–0.9)
MONO%: 7.1 % (ref 0.0–14.0)
NEUT%: 80.8 % — ABNORMAL HIGH (ref 38.4–76.8)
NEUTROS ABS: 6.3 10*3/uL (ref 1.5–6.5)
NRBC: 0 % (ref 0–0)
Platelets: 168 10*3/uL (ref 145–400)
RBC: 3.57 10*6/uL — AB (ref 3.70–5.45)
RDW: 13.2 % (ref 11.2–14.5)
WBC: 7.8 10*3/uL (ref 3.9–10.3)
lymph#: 0.8 10*3/uL — ABNORMAL LOW (ref 0.9–3.3)

## 2015-02-21 NOTE — Patient Instructions (Signed)
Prayer stretch, use of heat at home followed by active use of hand

## 2015-02-21 NOTE — Therapy (Signed)
[facility] [address] [place], [place], [address] Phone: [phone]   Fax:  [phone]  Occupational Therapy Treatment  Patient Details  Name: Kelly Fletcher MRN: [id number] Date of Birth: 1951/04/02 Referring Sou Nohr:  Estill Dooms, MD  Encounter Date: 02/21/2015      OT End of Session - 02/21/15 1202    Visit Number 35   Number of Visits 22   Date for OT Re-Evaluation 03/08/15   Authorization Type MCR - G code needed   Authorization Time Period 60 Days - 04/02/2015   OT Start Time 1102   OT Stop Time 1145   OT Time Calculation (min) 43 min   Activity Tolerance Patient tolerated treatment well      Past Medical History  Diagnosis Date  . Uterine prolapse   . Cystocele   . GERD (gastroesophageal reflux disease)   . Elevated cholesterol   . Rectocele   . Hypertension   . Arthritis   . Type II or unspecified type diabetes mellitus without mention of complication, uncontrolled     Type 2  . Urinary incontinence     Urgency  . Anxiety state, unspecified   . Palpitations   . Allergic rhinitis, cause unspecified   . Other and unspecified hyperlipidemia   . Unspecified hypothyroidism   . Edema   . Insomnia, unspecified   . Depressive disorder, not elsewhere classified   . Unspecified urinary incontinence   . Peptic ulcer, unspecified site, unspecified as acute or chronic, without mention of hemorrhage, perforation, or obstruction   . Disturbance of skin sensation   . Calculus of kidney   . Facial spasm 04/05/2014  . Abnormal brain MRI 04/13/2014  . 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 Monilial vaginitis 10/23/2014    Past Surgical History  Procedure Laterality Date  . Tonsillectomy and adenoidectomy  1963  . Dilation and curettage of uterus      3-05  . Hysteroscopy      3-05  . Endometrial biopsy      Dr.Gottsegen   .  Tongue biopsy      There were no vitals filed for this visit.  Visit Diagnosis:  Hemiplegia affecting right dominant side  Weakness of right hand  Cognitive deficits  Apraxia  Lack of coordination  Weakness of right arm  Pain in joint, shoulder region, right      Subjective Assessment - 02/21/15 1108    Subjective  My hand was good yesterday but it hurts and is stiff again today   Patient is accompained by: Family member  husband   Pertinent History glioblastoma with brain surgery to remove tumor 06/28/14, facial seizures   Patient Stated Goals To get my Rt shoulder and hand better b/c I'm Rt handed   Currently in Pain? Yes   Pain Score 7    Pain Orientation Right   Pain Descriptors / Indicators Sore  stiffness   Pain Type Chronic pain   Pain Onset More than a month ago   Pain Frequency Intermittent   Aggravating Factors  overuse, "it just stays really stiff"   Pain Relieving Factors rest, ice,    Multiple Pain Sites No                      OT Treatments/Exercises (OP) - 02/21/15 0001    Neurological Re-education Exercises  Other Exercises 1 Neuro red with therapeutic activity to addres active functional use of the R hand after modality and manual therapy. Pt requires min vc's to look at hand  and to fully engage R hand in activity due to sensory loss and apraxia.  "Numbness" varies from day to day and appears to impact functional use of hand. Discussed with pt need to make sure she is truly using hand fully vs using R hand as gross assist.  Pt also instructed in gentle stretch for R hand when it feels tight. Pt able to return demonstrate and husband also present and verabalized understanding.    Modalities   Modalities Fluidotherapy   RUE Fluidotherapy   Number Minutes Fluidotherapy 12 Minutes   RUE Fluidotherapy Location Hand   Comments Pt c/o of pain and stiffness last 3 visits in R hand. Obtained MD approval from ongocologist to use heat modality  (fluidotherapy) to address. Pt tolerated well with no adverse reactions.   Manual Therapy   Manual Therapy Joint mobilization;Soft tissue mobilization   Manual therapy comments Joint mob, soft tissue mob and active assisted tendon glides after fluidotherapy to address stiffness and tightness with good results. Pt reported a decine from 7/10 to 2/10 in pain in stiffness in R hand.                 OT Education - 02/21/15 1158    Education provided Yes   Education Details added R hand stretch   Person(s) Educated Patient;Spouse   Methods Explanation;Tactile cues;Verbal cues   Comprehension Verbalized understanding;Returned demonstration          OT Short Term Goals - 02/21/15 1159    OT SHORT TERM GOAL #2   Title Pt to verbalize understanding with pain management strategies RUE (ongoing for renewal period - due 01/10/15)   Status Achieved   OT SHORT TERM GOAL #3   Title Pt to write name at 75% or greater legibility w/ A/E for pen prn Rt dominant hand (due 01/10/15)   Baseline approx. 25% first name only with built up pen   Status Achieved   OT SHORT TERM GOAL #4   Title Improve RUE functional use as evidenced by performing 20 blocks or greater on Box & Blocks test   Baseline eval = 17, 12/04/14 = 12, 12/27/14 = 12   Status Not Met   OT SHORT TERM GOAL #5   Title Pt to improve grip strength Rt hand to 15 lbs or greater to decrease drops   Baseline eval = 10 lbs (Lt = 55 lbs); 12/04/14 = 10 lbs, 12/27/14 = 10 lbs   Status Not Met   OT SHORT TERM GOAL #7   Title Pt to use Rt dominant hand to feed [place] with finger foods 50% of the time or greater   Baseline none with Rt hand, using Lt non dominant hand   Status Achieved           OT Long Term Goals - 02/21/15 1159    OT LONG TERM GOAL #2   Baseline only 75% shoulder flexion   OT LONG TERM GOAL #3   Status --  will defer goal as pt has great difficulty with standardized tests due to cognitive, sensory and motor planning  deficits however functional use of the RUE has greatly improved.    OT LONG TERM GOAL #4   Baseline eval: only able to place 1 peg in 60 sec.    Status --  defer  goal as pt has great difficulty with standardized tests due to apraxia, sensory issues and cognitive impairment however RUE has greatly improved in functional use.    OT LONG TERM GOAL #5   Baseline eval = 10 lbs, 12/04/14 = 10 lbs   OT LONG TERM GOAL #6   Baseline only using LUE   OT LONG TERM GOAL #7   Baseline 25% legibility for first name only   OT LONG TERM GOAL #8   Baseline dependent   OT LONG TERM GOAL  #9   Baseline Pt will demonstrate ability to use RUE as domimant hand in at least 50% of IADL activities - 03/08/2015   Status Achieved   OT LONG TERM GOAL  #10   TITLE Pt will demonstrate ability to correctly orient hand and pick up and release light objects of varying size and shape with no more than min vc's   Status On-going   OT LONG TERM GOAL  #11   TITLE Pt will demonstrate ability to write first and last name with AE prn with 100% legibility   Status On-going   OT LONG TERM GOAL  #12   TITLE Pt willl be able to pick up light obejct off shelf at or above 95* of flexion without pain with no more than min compensations.   Status Achieved               Plan - 02/21/15 1200    Clinical Impression Statement Pt continues to make progres despite pain and stiffness in R hand, as well other numerous deficits. Pt very motivated to use R hand and at times, overuses it. Discussed with pt and husband need to use hand functionally but also need to balance this wtth rest. Pt and husband both verbalized understanding.    Pt will benefit from skilled therapeutic intervention in order to improve on the following deficits (Retired) Decreased cognition;Decreased knowledge of use of DME;Impaired flexibility;Pain;Decreased coordination;Decreased mobility;Impaired sensation;Improper body mechanics;Decreased activity  tolerance;Decreased endurance;Decreased range of motion;Decreased strength;Impaired tone;Decreased balance;Decreased knowledge of precautions;Decreased safety awareness;Impaired UE functional use;Impaired perceived functional ability   Rehab Potential Good   Clinical Impairments Affecting Rehab Potential apraxia, impaired sensation, impaired cogntion   OT Frequency 2x / week   OT Duration 4 weeks   OT Treatment/Interventions [place]-care/ADL training;Electrical Stimulation;Therapeutic exercise;Cognitive remediation/compensation;Moist Heat;Parrafin;Neuromuscular education;Splinting;Visual/perceptual remediation/compensation;Fluidtherapy;Energy conservation;Therapist, nutritional;Therapeutic exercises;Patient/family education;DME and/or AE instruction;Manual Therapy;Passive range of motion;Therapeutic activities   Plan address pain/stiffnes in hand  as needed, NMR for high reach, functional use of R hand   Consulted and Agree with Plan of Care Patient;Family member/caregiver   Family Member Consulted HUSBAND        Problem List Patient Active Problem List   Diagnosis Date Noted  . Monilial vaginitis 10/23/2014  . Deep vein thrombosis 08/12/2014  . History of inferior vena caval filter placement 07/14/2014  . Malignant glioma of brain 06/28/2014  . Diabetes mellitus, type 2 06/27/2014  . 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 Tremor of both hands 04/05/2014  . Vertigo 04/05/2014  . BPPV (benign paroxysmal positional vertigo) 03/14/2014  . Seizures 12/16/2013  . Epigastric pain 11/01/2013  . Fever blister 10/16/2013  . Hypothyroidism 12/13/2012  . Other and unspecified hyperlipidemia 12/13/2012  . Anxiety state, unspecified 12/13/2012  . Edema 12/13/2012  . Anxiety state 12/13/2012  . Adult hypothyroidism 12/13/2012  . Urinary incontinence   . DEPRESSION 09/24/2007  . Essential hypertension 09/24/2007  . GERD 09/24/2007  . HIATAL HERNIA 09/24/2007  .  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 Headache(784.0) 09/24/2007    Quay Burow, OTR/L 02/21/2015, 12:07 PM  [facility] [address] [place], [place], [address] Phone: [phone]   Fax:  [phone]

## 2015-02-26 ENCOUNTER — Encounter: Payer: Medicare Other | Admitting: Occupational Therapy

## 2015-02-28 ENCOUNTER — Encounter: Payer: Medicare Other | Admitting: Occupational Therapy

## 2015-02-28 ENCOUNTER — Ambulatory Visit (HOSPITAL_BASED_OUTPATIENT_CLINIC_OR_DEPARTMENT_OTHER): Payer: Medicare Other

## 2015-02-28 ENCOUNTER — Telehealth: Payer: Self-pay | Admitting: Oncology

## 2015-02-28 ENCOUNTER — Other Ambulatory Visit: Payer: Self-pay | Admitting: Nurse Practitioner

## 2015-02-28 ENCOUNTER — Other Ambulatory Visit (HOSPITAL_BASED_OUTPATIENT_CLINIC_OR_DEPARTMENT_OTHER): Payer: Medicare Other

## 2015-02-28 ENCOUNTER — Ambulatory Visit (HOSPITAL_BASED_OUTPATIENT_CLINIC_OR_DEPARTMENT_OTHER): Payer: Medicare Other | Admitting: Nurse Practitioner

## 2015-02-28 VITALS — BP 139/67 | HR 68 | Temp 98.5°F | Resp 18 | Ht 69.0 in | Wt 159.7 lb

## 2015-02-28 DIAGNOSIS — R300 Dysuria: Secondary | ICD-10-CM

## 2015-02-28 DIAGNOSIS — C719 Malignant neoplasm of brain, unspecified: Secondary | ICD-10-CM

## 2015-02-28 DIAGNOSIS — C711 Malignant neoplasm of frontal lobe: Secondary | ICD-10-CM

## 2015-02-28 LAB — COMPREHENSIVE METABOLIC PANEL (CC13)
ALT: 16 U/L (ref 0–55)
ANION GAP: 8 meq/L (ref 3–11)
AST: 14 U/L (ref 5–34)
Albumin: 3.8 g/dL (ref 3.5–5.0)
Alkaline Phosphatase: 64 U/L (ref 40–150)
BUN: 10.7 mg/dL (ref 7.0–26.0)
CALCIUM: 9 mg/dL (ref 8.4–10.4)
CHLORIDE: 106 meq/L (ref 98–109)
CO2: 29 mEq/L (ref 22–29)
Creatinine: 0.9 mg/dL (ref 0.6–1.1)
EGFR: 69 mL/min/{1.73_m2} — ABNORMAL LOW (ref >90)
Glucose: 102 mg/dl (ref 70–140)
POTASSIUM: 4.1 meq/L (ref 3.5–5.1)
Sodium: 143 mEq/L (ref 136–145)
Total Bilirubin: 0.38 mg/dL (ref 0.20–1.20)
Total Protein: 6.1 g/dL — ABNORMAL LOW (ref 6.4–8.3)

## 2015-02-28 LAB — URINALYSIS, MICROSCOPIC - CHCC
Bilirubin (Urine): NEGATIVE
GLUCOSE UR CHCC: NEGATIVE mg/dL
KETONES: NEGATIVE mg/dL
Nitrite: POSITIVE
PH: 5 (ref 4.6–8.0)
SPECIFIC GRAVITY, URINE: 1.01 (ref 1.003–1.035)
UROBILINOGEN UR: 0.2 mg/dL (ref 0.2–1)

## 2015-02-28 LAB — CBC WITH DIFFERENTIAL/PLATELET
BASO%: 0.5 % (ref 0.0–2.0)
BASOS ABS: 0 10*3/uL (ref 0.0–0.1)
EOS%: 3 % (ref 0.0–7.0)
Eosinophils Absolute: 0.1 10*3/uL (ref 0.0–0.5)
HEMATOCRIT: 37 % (ref 34.8–46.6)
HGB: 12.4 g/dL (ref 11.6–15.9)
LYMPH#: 0.8 10*3/uL — AB (ref 0.9–3.3)
LYMPH%: 16.8 % (ref 14.0–49.7)
MCH: 34.5 pg — AB (ref 25.1–34.0)
MCHC: 33.5 g/dL (ref 31.5–36.0)
MCV: 103 fL — ABNORMAL HIGH (ref 79.5–101.0)
MONO#: 0.4 10*3/uL (ref 0.1–0.9)
MONO%: 8.2 % (ref 0.0–14.0)
NEUT#: 3.5 10*3/uL (ref 1.5–6.5)
NEUT%: 71.5 % (ref 38.4–76.8)
PLATELETS: 142 10*3/uL — AB (ref 145–400)
RBC: 3.59 10*6/uL — ABNORMAL LOW (ref 3.70–5.45)
RDW: 13.3 % (ref 11.2–14.5)
WBC: 4.9 10*3/uL (ref 3.9–10.3)

## 2015-02-28 NOTE — Progress Notes (Addendum)
  [facility] OFFICE PROGRESS NOTE   Diagnosis:  Glioblastoma  INTERVAL HISTORY:   Kelly Fletcher returns as scheduled. She completed cycle 4 temozolomide beginning 02/08/2015. She denies nausea/vomiting. No mouth sores. No rash. Bowels moving with the aid of a stool softener and laxative. She has had a recent "dry cough and stuffy nose". No shortness of breath or fever. She has recently noted cloudy urine and mild dysuria.  Objective:  Vital signs in last 24 hours:  Blood pressure 139/67, pulse 68, temperature 98.5 F (36.9 C), temperature source Oral, resp. rate 18, height $RemoveBe'5\' 9"'qZUSUeGtB$  (1.753 m), weight 159 lb 11.2 oz (72.439 kg), SpO2 100 %.    HEENT: No thrush or ulcers. Resp: Lungs clear bilaterally. Cardio: Regular rate and rhythm. GI: Abdomen soft and nontender. No hepatomegaly. Vascular: The right lower leg is slightly larger than the left lower leg. Soft and nontender. Neuro: Alert. Partial expressive aphasia. 4 over 5 strength in the right arm and right leg.   Lab Results:  Lab Results  Component Value Date   WBC 4.9 02/28/2015   HGB 12.4 02/28/2015   HCT 37.0 02/28/2015   MCV 103.0* 02/28/2015   PLT 142* 02/28/2015   NEUTROABS 3.5 02/28/2015    Imaging:  No results found.  Medications: I have reviewed the patient's current medications.  Assessment/Plan: 1. 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 History of  seizures secondary to #1  3. 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 Venous Doppler 02/01/2015 negative for DVT. IVC filter removed 02/26/2015.  4. 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 Diabetes  6. Hypertension  7. Hyperlipidemia  8. Pain at the right upper arm/shoulder. She did not complain of pain on 02/28/2015.   9. Mild facial rash 12/27/2014-unlikely related temozolomide, rosacea?Marland Kitchen She was to follow-up with her dermatologist. No rash noted 02/28/2015.   Disposition: Kelly Fletcher appears stable. She has now completed 4 cycles of adjuvant temozolomide. The temozolomide was dose reduced with cycle 4 due to previous thrombocytopenia. There has been no significant decline in the platelet count thus far. She will return for a follow-up CBC on 03/07/2015. The plan is to begin cycle 5 adjuvant temozolomide on 03/08/2015 if the platelet count is adequate. She will return for a follow-up visit on 04/02/2015. She will contact the office in the interim with any problems.  Patient seen with Dr. Benay Spice.    Ned Card ANP/GNP-BC   02/28/2015  2:18 PM  This was a shared visit with Ned Card. Kelly Fletcher appears stable. She has complete 4 cycles of adjuvant temozolomide. The plan is to proceed with cycle 5 on 03/08/2015. The IVC filter has been removed.  Julieanne Manson, M.D.

## 2015-02-28 NOTE — Telephone Encounter (Signed)
Gave and printed appt sched and avs fo rpt for OCT and NOV  °

## 2015-03-02 ENCOUNTER — Other Ambulatory Visit: Payer: Self-pay | Admitting: Internal Medicine

## 2015-03-02 LAB — URINE CULTURE

## 2015-03-04 ENCOUNTER — Other Ambulatory Visit: Payer: Self-pay | Admitting: Nurse Practitioner

## 2015-03-04 ENCOUNTER — Encounter: Payer: Self-pay | Admitting: Occupational Therapy

## 2015-03-04 ENCOUNTER — Ambulatory Visit: Payer: Medicare Other | Attending: Radiology | Admitting: Occupational Therapy

## 2015-03-04 ENCOUNTER — Telehealth: Payer: Self-pay | Admitting: Nurse Practitioner

## 2015-03-04 DIAGNOSIS — R482 Apraxia: Secondary | ICD-10-CM

## 2015-03-04 DIAGNOSIS — R29898 Other symptoms and signs involving the musculoskeletal system: Secondary | ICD-10-CM

## 2015-03-04 DIAGNOSIS — R279 Unspecified lack of coordination: Secondary | ICD-10-CM

## 2015-03-04 DIAGNOSIS — G8191 Hemiplegia, unspecified affecting right dominant side: Secondary | ICD-10-CM | POA: Diagnosis present

## 2015-03-04 DIAGNOSIS — R4189 Other symptoms and signs involving cognitive functions and awareness: Secondary | ICD-10-CM | POA: Diagnosis present

## 2015-03-04 DIAGNOSIS — M6289 Other specified disorders of muscle: Secondary | ICD-10-CM | POA: Insufficient documentation

## 2015-03-04 DIAGNOSIS — C719 Malignant neoplasm of brain, unspecified: Secondary | ICD-10-CM

## 2015-03-04 DIAGNOSIS — M25511 Pain in right shoulder: Secondary | ICD-10-CM

## 2015-03-04 DIAGNOSIS — N390 Urinary tract infection, site not specified: Secondary | ICD-10-CM

## 2015-03-04 MED ORDER — AMOXICILLIN-POT CLAVULANATE 500-125 MG PO TABS: 1.0000 | ORAL_TABLET | Freq: Two times a day (BID) | ORAL | Status: DC

## 2015-03-04 NOTE — Therapy (Signed)
[facility] [address] [place], [place], [address] Phone: [phone]   Fax:  [phone]  Occupational Therapy Treatment  Patient Details  Name: Kelly Fletcher MRN: [id number] Date of Birth: Jan 03, 1951 Referring Provider:  Estill Dooms, MD  Encounter Date: 03/04/2015      OT End of Session - 03/04/15 1611    Visit Number 36   Number of Visits 39   Date for OT Re-Evaluation 03/15/15  Adjusted as pt missed one week of therapy due to medical procedure      Past Medical History  Diagnosis Date  . Uterine prolapse   . Cystocele   . GERD (gastroesophageal reflux disease)   . Elevated cholesterol   . Rectocele   . Hypertension   . Arthritis   . Type II or unspecified type diabetes mellitus without mention of complication, uncontrolled     Type 2  . Urinary incontinence     Urgency  . Anxiety state, unspecified   . Palpitations   . Allergic rhinitis, cause unspecified   . Other and unspecified hyperlipidemia   . Unspecified hypothyroidism   . Edema   . Insomnia, unspecified   . Depressive disorder, not elsewhere classified   . Unspecified urinary incontinence   . Peptic ulcer, unspecified site, unspecified as acute or chronic, without mention of hemorrhage, perforation, or obstruction   . Disturbance of skin sensation   . Calculus of kidney   . Facial spasm 04/05/2014  . Abnormal brain MRI 04/13/2014  . Focal motor seizure disorder ([facility]) 05/04/2014    Facial and neck seizures, rleated  To brain mets.  Right face   . Monilial vaginitis 10/23/2014    Past Surgical History  Procedure Laterality Date  . Tonsillectomy and adenoidectomy  1963  . Dilation and curettage of uterus      3-05  . Hysteroscopy      3-05  . Endometrial biopsy      Dr.Gottsegen   . Tongue biopsy      There were no vitals filed for this visit.  Visit Diagnosis:  Hemiplegia affecting right dominant side (HCC)  Weakness of  right hand  Cognitive deficits  Apraxia  Lack of coordination  Weakness of right arm  Pain in joint, shoulder region, right      Subjective Assessment - 03/04/15 1319    Subjective  I am really going to miss you when therapy ends   Patient is accompained by: Family member  husband   Pertinent History glioblastoma with brain surgery to remove tumor 06/28/14, facial seizures   Patient Stated Goals To get my Rt shoulder and hand better b/c I'm Rt handed   Currently in Pain? No/denies  stiffness in r side of neck from surgery and sitffnes in R hand                      OT Treatments/Exercises (OP) - 03/04/15 0001    Neurological Re-education Exercises   Other Exercises 1 Neuro re ed to address mid to high reach, orientation of hand to task, grasp and release and in hand manipulation. Pt was very teary today which impacted her ability to focus and use her R hand. Support provided.   Manual Therapy   Manual Therapy Joint mobilization;Soft tissue mobilization   Manual therapy comments  Joint mob and  soft tissue mob to R hand to reduce stiffness in preparation for functional reach with grasp and release. Pt reponded  well to techniques and stated much less stiffness by end of session.                  OT Short Term Goals - 03/04/15 1459    OT SHORT TERM GOAL #2   Title Pt to verbalize understanding with pain management strategies RUE (ongoing for renewal period - due 01/10/15)   Status Achieved   OT SHORT TERM GOAL #3   Title Pt to write name at 75% or greater legibility w/ A/E for pen prn Rt dominant hand (due 01/10/15)   Baseline approx. 25% first name only with built up pen   Status Achieved   OT SHORT TERM GOAL #4   Title Improve RUE functional use as evidenced by performing 20 blocks or greater on Box & Blocks test   Baseline eval = 17, 12/04/14 = 12, 12/27/14 = 12   Status Not Met   OT SHORT TERM GOAL #5   Title Pt to improve grip strength Rt hand to 15  lbs or greater to decrease drops   Baseline eval = 10 lbs (Lt = 55 lbs); 12/04/14 = 10 lbs, 12/27/14 = 10 lbs   Status Not Met   OT SHORT TERM GOAL #7   Title Pt to use Rt dominant hand to feed [place] with finger foods 50% of the time or greater   Baseline none with Rt hand, using Lt non dominant hand   Status Achieved           OT Long Term Goals - 03/04/15 1459    OT LONG TERM GOAL #2   Baseline only 75% shoulder flexion   OT LONG TERM GOAL #3   Status --  will defer goal as pt has great difficulty with standardized tests due to cognitive, sensory and motor planning deficits however functional use of the RUE has greatly improved.    OT LONG TERM GOAL #4   Baseline eval: only able to place 1 peg in 60 sec.    Status --  defer goal as pt has great difficulty with standardized tests due to apraxia, sensory issues and cognitive impairment however RUE has greatly improved in functional use.    OT LONG TERM GOAL #5   Baseline eval = 10 lbs, 12/04/14 = 10 lbs   OT LONG TERM GOAL #6   Baseline only using LUE   OT LONG TERM GOAL #7   Baseline 25% legibility for first name only   OT LONG TERM GOAL #8   Baseline dependent   OT LONG TERM GOAL  #9   Baseline Pt will demonstrate ability to use RUE as domimant hand in at least 50% of IADL activities - 03/15/2015 (date adjusted as pt missed one week of therapy for medical procedure)   Status Achieved   OT LONG TERM GOAL  #10   TITLE Pt will demonstrate ability to correctly orient hand and pick up and release light objects of varying size and shape with no more than min vc's   Status Achieved   OT LONG TERM GOAL  #11   TITLE Pt will demonstrate ability to write first and last name with AE prn with 100% legibility   Status On-going   OT LONG TERM GOAL  #12   TITLE Pt willl be able to pick up light obejct off shelf at or above 95* of flexion without pain with no more than min compensations.   Status Achieved  Plan - 03/04/15  1501    Clinical Impression Statement Pt continues to make slow steady progress toward goals.  Pt very teary today and with increased fatigue from chemo and recent surgery to remove filter.    Pt will benefit from skilled therapeutic intervention in order to improve on the following deficits (Retired) Decreased cognition;Decreased knowledge of use of DME;Impaired flexibility;Pain;Decreased coordination;Decreased mobility;Impaired sensation;Improper body mechanics;Decreased activity tolerance;Decreased endurance;Decreased range of motion;Decreased strength;Impaired tone;Decreased balance;Decreased knowledge of precautions;Decreased safety awareness;Impaired UE functional use;Impaired perceived functional ability   Rehab Potential Good   Clinical Impairments Affecting Rehab Potential apraxia, impaired sensation, impaired cogntion   OT Frequency 2x / week   OT Duration 4 weeks   OT Treatment/Interventions [place]-care/ADL training;Electrical Stimulation;Therapeutic exercise;Cognitive remediation/compensation;Moist Heat;Parrafin;Neuromuscular education;Splinting;Visual/perceptual remediation/compensation;Fluidtherapy;Energy conservation;Therapist, nutritional;Therapeutic exercises;Patient/family education;DME and/or AE instruction;Manual Therapy;Passive range of motion;Therapeutic activities   Plan address stiffness/pain in hand as needed, NMR for high reach, functional use of hand.    Consulted and Agree with Plan of Care Patient;Family member/caregiver   Family Member Consulted HUSBAND        Problem List Patient Active Problem List   Diagnosis Date Noted  . Monilial vaginitis 10/23/2014  . Deep vein thrombosis ([facility]) 08/12/2014  . History of inferior vena caval filter placement 07/14/2014  . Malignant glioma of brain ([facility]) 06/28/2014  . Diabetes mellitus, type 2 ([facility]) 06/27/2014  . Focal motor seizure disorder ([facility]) 05/04/2014  . Tremor of both hands 04/05/2014  . Vertigo 04/05/2014  .  BPPV (benign paroxysmal positional vertigo) 03/14/2014  . Seizures ([facility]) 12/16/2013  . Epigastric pain 11/01/2013  . Fever blister 10/16/2013  . Hypothyroidism 12/13/2012  . Other and unspecified hyperlipidemia 12/13/2012  . Anxiety state, unspecified 12/13/2012  . Edema 12/13/2012  . Anxiety state 12/13/2012  . Adult hypothyroidism 12/13/2012  . Urinary incontinence   . DEPRESSION 09/24/2007  . Essential hypertension 09/24/2007  . GERD 09/24/2007  . HIATAL HERNIA 09/24/2007  . 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 Headache(784.0) 09/24/2007    Quay Burow, OTR/L 03/04/2015, 4:15 PM  [facility] [address] [place], [place], [address] Phone: [phone]   Fax:  [phone]

## 2015-03-04 NOTE — Telephone Encounter (Signed)
As per Lattie Haw call placed to Ms. Pineda to inform her that the urine culture done showed she has an infection, Informed Ms. Degidio that a prescription was sent to [facility] for her to pick up. Pt. Verbalized understanding.

## 2015-03-04 NOTE — Telephone Encounter (Signed)
-----   Message from Owens Shark, NP sent at 03/04/2015 10:38 AM EDT ----- Please let her know the urine culture showed an infection. I sent a prescription to her pharmacy for Augmentin.

## 2015-03-07 ENCOUNTER — Ambulatory Visit: Payer: Medicare Other | Admitting: Occupational Therapy

## 2015-03-07 ENCOUNTER — Encounter: Payer: Self-pay | Admitting: Occupational Therapy

## 2015-03-07 ENCOUNTER — Other Ambulatory Visit (HOSPITAL_BASED_OUTPATIENT_CLINIC_OR_DEPARTMENT_OTHER): Payer: Medicare Other

## 2015-03-07 DIAGNOSIS — R29898 Other symptoms and signs involving the musculoskeletal system: Secondary | ICD-10-CM

## 2015-03-07 DIAGNOSIS — M25511 Pain in right shoulder: Secondary | ICD-10-CM

## 2015-03-07 DIAGNOSIS — R482 Apraxia: Secondary | ICD-10-CM

## 2015-03-07 DIAGNOSIS — C711 Malignant neoplasm of frontal lobe: Secondary | ICD-10-CM

## 2015-03-07 DIAGNOSIS — R279 Unspecified lack of coordination: Secondary | ICD-10-CM

## 2015-03-07 DIAGNOSIS — C719 Malignant neoplasm of brain, unspecified: Secondary | ICD-10-CM

## 2015-03-07 DIAGNOSIS — G8191 Hemiplegia, unspecified affecting right dominant side: Secondary | ICD-10-CM | POA: Diagnosis not present

## 2015-03-07 DIAGNOSIS — R4189 Other symptoms and signs involving cognitive functions and awareness: Secondary | ICD-10-CM

## 2015-03-07 LAB — CBC WITH DIFFERENTIAL/PLATELET
BASO%: 0.4 % (ref 0.0–2.0)
BASOS ABS: 0 10*3/uL (ref 0.0–0.1)
EOS ABS: 0.2 10*3/uL (ref 0.0–0.5)
EOS%: 2.9 % (ref 0.0–7.0)
HEMATOCRIT: 36.5 % (ref 34.8–46.6)
HGB: 12.6 g/dL (ref 11.6–15.9)
LYMPH%: 21.1 % (ref 14.0–49.7)
MCH: 34.6 pg — ABNORMAL HIGH (ref 25.1–34.0)
MCHC: 34.5 g/dL (ref 31.5–36.0)
MCV: 100.3 fL (ref 79.5–101.0)
MONO#: 0.4 10*3/uL (ref 0.1–0.9)
MONO%: 6.8 % (ref 0.0–14.0)
NEUT%: 68.8 % (ref 38.4–76.8)
NEUTROS ABS: 3.9 10*3/uL (ref 1.5–6.5)
NRBC: 0 % (ref 0–0)
PLATELETS: 126 10*3/uL — AB (ref 145–400)
RBC: 3.64 10*6/uL — AB (ref 3.70–5.45)
RDW: 12.1 % (ref 11.2–14.5)
WBC: 5.6 10*3/uL (ref 3.9–10.3)
lymph#: 1.2 10*3/uL (ref 0.9–3.3)

## 2015-03-07 NOTE — Therapy (Signed)
[facility] [address] [place], [place], [address] Phone: [phone]   Fax:  [phone]  Occupational Therapy Treatment  Patient Details  Name: Kelly Fletcher MRN: [id number] Date of Birth: November 19, 1950 Referring Provider:  Estill Dooms, MD  Encounter Date: 03/07/2015      OT End of Session - 03/07/15 1718    Visit Number 37   Number of Visits 38   Authorization Type MCR - G code needed   Authorization Time Period 60 Days - 04/02/2015   OT Start Time 1401   OT Stop Time 1444   OT Time Calculation (min) 43 min   Activity Tolerance Patient tolerated treatment well      Past Medical History  Diagnosis Date  . Uterine prolapse   . Cystocele   . GERD (gastroesophageal reflux disease)   . Elevated cholesterol   . Rectocele   . Hypertension   . Arthritis   . Type II or unspecified type diabetes mellitus without mention of complication, uncontrolled     Type 2  . Urinary incontinence     Urgency  . Anxiety state, unspecified   . Palpitations   . Allergic rhinitis, cause unspecified   . Other and unspecified hyperlipidemia   . Unspecified hypothyroidism   . Edema   . Insomnia, unspecified   . Depressive disorder, not elsewhere classified   . Unspecified urinary incontinence   . Peptic ulcer, unspecified site, unspecified as acute or chronic, without mention of hemorrhage, perforation, or obstruction   . Disturbance of skin sensation   . Calculus of kidney   . Facial spasm 04/05/2014  . Abnormal brain MRI 04/13/2014  . 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  Right face   . Monilial vaginitis 10/23/2014    Past Surgical History  Procedure Laterality Date  . Tonsillectomy and adenoidectomy  1963  . Dilation and curettage of uterus      3-05  . Hysteroscopy      3-05  . Endometrial biopsy      Dr.Gottsegen   . Tongue biopsy      There were  no vitals filed for this visit.  Visit Diagnosis:  Hemiplegia affecting right dominant side (HCC)  Weakness of right hand  Cognitive deficits  Apraxia  Lack of coordination  Weakness of right arm  Pain in joint, shoulder region, right      Subjective Assessment - 03/07/15 1410    Subjective  My left arm hurts sometimes from sleeping on it all the time (pt states she can't sleep on her back or right side despite education on bed positioning.   Patient is accompained by: Family member  husband   Pertinent History glioblastoma with brain surgery to remove tumor 06/28/14, facial seizures   Patient Stated Goals To get my Rt shoulder and hand better b/c I'm Rt handed   Currently in Pain? No/denies                      OT Treatments/Exercises (OP) - 03/07/15 0001    ADLs   Writing Pt able to write first and last name as well as 90% of home address using large pen.  Pt's hand fatigues although signficant improvement in ability appropriate hold pen, using force behind pen, form letters with control and sustained motor activity.    ADL Comments Also addressed grasp and relaease of various sized  objects with different shapes to address hand orientation and ability to use hand functionally   Moist Heat Therapy   Number Minutes Moist Heat 8 Minutes  simultaneously with manual therapy   Moist Heat Location Hand  pt unable to tolerate fluidotherapy   Manual Therapy   Manual Therapy Joint mobilization;Soft tissue mobilization  simultaneously with heat   Manual therapy comments Joint and soft tissue mob prior to writing activity and grasp and release tasks with good results. Pt no longer c/o pain just stiffness in R hand                  OT Short Term Goals - 03/07/15 1715    OT SHORT TERM GOAL #2   Title Pt to verbalize understanding with pain management strategies RUE (ongoing for renewal period - due 01/10/15)   Status Achieved   OT SHORT TERM GOAL #3   Title  Pt to write name at 75% or greater legibility w/ A/E for pen prn Rt dominant hand (due 01/10/15)   Baseline approx. 25% first name only with built up pen   Status Achieved   OT SHORT TERM GOAL #4   Title Improve RUE functional use as evidenced by performing 20 blocks or greater on Box & Blocks test   Baseline eval = 17, 12/04/14 = 12, 12/27/14 = 12   Status Not Met   OT SHORT TERM GOAL #5   Title Pt to improve grip strength Rt hand to 15 lbs or greater to decrease drops   Baseline eval = 10 lbs (Lt = 55 lbs); 12/04/14 = 10 lbs, 12/27/14 = 10 lbs   Status Not Met   OT SHORT TERM GOAL #7   Title Pt to use Rt dominant hand to feed [place] with finger foods 50% of the time or greater   Baseline none with Rt hand, using Lt non dominant hand   Status Achieved           OT Long Term Goals - 03/07/15 1715    OT LONG TERM GOAL #2   Baseline only 75% shoulder flexion   OT LONG TERM GOAL #3   Status --  will defer goal as pt has great difficulty with standardized tests due to cognitive, sensory and motor planning deficits however functional use of the RUE has greatly improved.    OT LONG TERM GOAL #4   Baseline eval: only able to place 1 peg in 60 sec.    Status --  defer goal as pt has great difficulty with standardized tests due to apraxia, sensory issues and cognitive impairment however RUE has greatly improved in functional use.    OT LONG TERM GOAL #5   Baseline eval = 10 lbs, 12/04/14 = 10 lbs   OT LONG TERM GOAL #6   Baseline only using LUE   OT LONG TERM GOAL #7   Baseline 25% legibility for first name only   OT LONG TERM GOAL #8   Baseline dependent   OT LONG TERM GOAL  #9   Baseline Pt will demonstrate ability to use RUE as domimant hand in at least 50% of IADL activities - 03/15/2015 (date adjusted as pt missed one week of therapy for medical procedure)   Status Achieved   OT LONG TERM GOAL  #10   TITLE Pt will demonstrate ability to correctly orient hand and pick up and release light  objects of varying size and shape with no more than min vc's  Status Achieved   OT LONG TERM GOAL  #11   TITLE Pt will demonstrate ability to write first and last name with AE prn with 100% legibility   Status Achieved   OT LONG TERM GOAL  #12   TITLE Pt willl be able to pick up light obejct off shelf at or above 95* of flexion without pain with no more than min compensations.   Status Achieved               Plan - 03/07/15 1716    Clinical Impression Statement Pt has met all LTG's and will finalize HEP next session in preparation for discharge from OT. Pt has made excellent progress in ability to use RUE functionally.    Pt will benefit from skilled therapeutic intervention in order to improve on the following deficits (Retired) Decreased cognition;Decreased knowledge of use of DME;Impaired flexibility;Pain;Decreased coordination;Decreased mobility;Impaired sensation;Improper body mechanics;Decreased activity tolerance;Decreased endurance;Decreased range of motion;Decreased strength;Impaired tone;Decreased balance;Decreased knowledge of precautions;Decreased safety awareness;Impaired UE functional use;Impaired perceived functional ability   Rehab Potential Good   Clinical Impairments Affecting Rehab Potential apraxia, impaired sensation, impaired cogntion   OT Frequency 2x / week   OT Duration 4 weeks   OT Treatment/Interventions Stigger-care/ADL training;Electrical Stimulation;Therapeutic exercise;Cognitive remediation/compensation;Moist Heat;Parrafin;Neuromuscular education;Splinting;Visual/perceptual remediation/compensation;Fluidtherapy;Energy conservation;Therapist, nutritional;Therapeutic exercises;Patient/family education;DME and/or AE instruction;Manual Therapy;Passive range of motion;Therapeutic activities   Plan finalize HEP in preparation for d/c   Consulted and Agree with Plan of Care Patient;Family member/caregiver   Family Member Consulted HUSBAND        Problem  List Patient Active Problem List   Diagnosis Date Noted  . Monilial vaginitis 10/23/2014  . Deep vein thrombosis ([place]) 08/12/2014  . History of inferior vena caval filter placement 07/14/2014  . Malignant glioma of brain ([place]) 06/28/2014  . Diabetes mellitus, type 2 ([place]) 06/27/2014  . 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 Tremor of both hands 04/05/2014  . Vertigo 04/05/2014  . BPPV (benign paroxysmal positional vertigo) 03/14/2014  . Seizures ([place]) 12/16/2013  . Epigastric pain 11/01/2013  . Fever blister 10/16/2013  . Hypothyroidism 12/13/2012  . Other and unspecified hyperlipidemia 12/13/2012  . Anxiety state, unspecified 12/13/2012  . Edema 12/13/2012  . Anxiety state 12/13/2012  . Adult hypothyroidism 12/13/2012  . Urinary incontinence   . DEPRESSION 09/24/2007  . Essential hypertension 09/24/2007  . GERD 09/24/2007  . HIATAL HERNIA 09/24/2007  . 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 Headache(784.0) 09/24/2007    Quay Burow, OTR/L 03/07/2015, 5:20 PM  [facility] [address] [place], [place], [address] Phone: [phone]   Fax:  [phone]

## 2015-03-08 ENCOUNTER — Telehealth: Payer: Self-pay | Admitting: *Deleted

## 2015-03-08 ENCOUNTER — Other Ambulatory Visit: Payer: Self-pay | Admitting: *Deleted

## 2015-03-08 MED ORDER — TEMOZOLOMIDE 140 MG PO CAPS: 280.0000 mg | ORAL_CAPSULE | Freq: Every day | ORAL | Status: DC

## 2015-03-08 NOTE — Telephone Encounter (Signed)
Husband called "is it okay for her to start her Temodar with platelets 126."  Per Dr. Benay Spice; notified husband that pt can take her Temodar. Pt's husband verbalized understanding and request re-fill.  Will send refill in today.  Pt's husband verbalized understanding and expressed appreciation for call back.

## 2015-03-11 ENCOUNTER — Encounter: Payer: Self-pay | Admitting: Occupational Therapy

## 2015-03-11 ENCOUNTER — Ambulatory Visit: Payer: Medicare Other | Admitting: Occupational Therapy

## 2015-03-11 DIAGNOSIS — G8191 Hemiplegia, unspecified affecting right dominant side: Secondary | ICD-10-CM

## 2015-03-11 DIAGNOSIS — R482 Apraxia: Secondary | ICD-10-CM

## 2015-03-11 DIAGNOSIS — R29898 Other symptoms and signs involving the musculoskeletal system: Secondary | ICD-10-CM

## 2015-03-11 DIAGNOSIS — R4189 Other symptoms and signs involving cognitive functions and awareness: Secondary | ICD-10-CM

## 2015-03-11 DIAGNOSIS — R279 Unspecified lack of coordination: Secondary | ICD-10-CM

## 2015-03-11 DIAGNOSIS — M25511 Pain in right shoulder: Secondary | ICD-10-CM

## 2015-03-11 NOTE — Therapy (Signed)
[facility] [address] [place], [place], [address] Phone: [phone]   Fax:  [phone]  Occupational Therapy Treatment  Patient Details  Name: Kelly Fletcher MRN: [id number] Date of Birth: Jun 23, 1950 Referring Provider:  Estill Dooms, MD  Encounter Date: 03/11/2015      OT End of Session - 03/11/15 1244    Visit Number 38   Number of Visits 38   Date for OT Re-Evaluation 03/15/15   Authorization Type MCR - G code needed   Authorization Time Period 60 Days - 04/02/2015   OT Start Time 1017   OT Stop Time 1100   OT Time Calculation (min) 43 min   Activity Tolerance Patient tolerated treatment well      Past Medical History  Diagnosis Date  . Uterine prolapse   . Cystocele   . GERD (gastroesophageal reflux disease)   . Elevated cholesterol   . Rectocele   . Hypertension   . Arthritis   . Type II or unspecified type diabetes mellitus without mention of complication, uncontrolled     Type 2  . Urinary incontinence     Urgency  . Anxiety state, unspecified   . Palpitations   . Allergic rhinitis, cause unspecified   . Other and unspecified hyperlipidemia   . Unspecified hypothyroidism   . Edema   . Insomnia, unspecified   . Depressive disorder, not elsewhere classified   . Unspecified urinary incontinence   . Peptic ulcer, unspecified site, unspecified as acute or chronic, without mention of hemorrhage, perforation, or obstruction   . Disturbance of skin sensation   . Calculus of kidney   . Facial spasm 04/05/2014  . Abnormal brain MRI 04/13/2014  . 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  Right face   . Monilial vaginitis 10/23/2014    Past Surgical History  Procedure Laterality Date  . Tonsillectomy and adenoidectomy  1963  . Dilation and curettage of uterus      3-05  . Hysteroscopy      3-05  . Endometrial biopsy      Dr.Gottsegen    . Tongue biopsy      There were no vitals filed for this visit.  Visit Diagnosis:  Hemiplegia affecting right dominant side (HCC)  Weakness of right hand  Cognitive deficits  Apraxia  Lack of coordination  Weakness of right arm  Pain in joint, shoulder region, right      Subjective Assessment - 03/11/15 1022    Subjective  This is my last session with you.     Patient is accompained by: Family member  husband   Pertinent History glioblastoma with brain surgery to remove tumor 06/28/14, facial seizures   Patient Stated Goals To get my Rt shoulder and hand better b/c I'm Rt handed   Currently in Pain? No/denies  sitffness in R hand but no pain                      OT Treatments/Exercises (OP) - 03/11/15 0001    Neurological Re-education Exercises   Other Exercises 1 Finalized HEP with modifications. See pt instruction section for details. Pt and husband able to return demonstrate all activities and verbalize understanding.                OT Education - 03/11/15 1240    Education provided Yes   Education Details updated HEP for  RUE   Person(s) Educated Patient;Spouse   Methods Explanation;Demonstration;Tactile cues;Verbal cues;Handout   Comprehension Verbalized understanding;Returned demonstration          OT Short Term Goals - 03/11/15 1241    OT SHORT TERM GOAL #2   Title --   Status --   OT SHORT TERM GOAL #3   Title --   Baseline --   Status --   OT SHORT TERM GOAL #4   Title --   Baseline --   Status --   OT SHORT TERM GOAL #5   Title --   Baseline --   Status --   OT SHORT TERM GOAL #7   Title --   Baseline --   Status --           OT Long Term Goals - 03/11/15 1241    OT LONG TERM GOAL #2   Baseline only 75% shoulder flexion   OT LONG TERM GOAL #3   Status --  will defer goal as pt has great difficulty with standardized tests due to cognitive, sensory and motor planning deficits however functional use of the  RUE has greatly improved.    OT LONG TERM GOAL #4   Baseline eval: only able to place 1 peg in 60 sec.    Status --  defer goal as pt has great difficulty with standardized tests due to apraxia, sensory issues and cognitive impairment however RUE has greatly improved in functional use.    OT LONG TERM GOAL #5   Baseline eval = 10 lbs, 12/04/14 = 10 lbs   OT LONG TERM GOAL #6   Baseline only using LUE   OT LONG TERM GOAL #7   Baseline 25% legibility for first name only   OT LONG TERM GOAL #8   Baseline dependent   OT LONG TERM GOAL  #9   Baseline Pt will demonstrate ability to use RUE as domimant hand in at least 50% of IADL activities - 03/15/2015 (date adjusted as pt missed one week of therapy for medical procedure)   Status Achieved   OT LONG TERM GOAL  #10   TITLE Pt will demonstrate ability to correctly orient hand and pick up and release light objects of varying size and shape with no more than min vc's   Status Achieved   OT LONG TERM GOAL  #11   TITLE Pt will demonstrate ability to write first and last name with AE prn with 100% legibility   Status Achieved   OT LONG TERM GOAL  #12   TITLE Pt willl be able to pick up light obejct off shelf at or above 95* of flexion without pain with no more than min compensations.   Status Achieved               Plan - 03/11/15 1243    Clinical Impression Statement Pt has met all LTG"s and finalized HEP for RUE today. Pt and husband able to return demonstrate all changes and verbalize understanding. Pt teary at idea of discharge but states she knows it is ok to take a break at this point. Pt and husband in agreement regarding plan to discharge at this time.    Pt will benefit from skilled therapeutic intervention in order to improve on the following deficits (Retired) Decreased cognition;Decreased knowledge of use of DME;Impaired flexibility;Pain;Decreased coordination;Decreased mobility;Impaired sensation;Improper body mechanics;Decreased  activity tolerance;Decreased endurance;Decreased range of motion;Decreased strength;Impaired tone;Decreased balance;Decreased knowledge of precautions;Decreased safety awareness;Impaired UE functional use;Impaired perceived functional  ability   Rehab Potential Good   Clinical Impairments Affecting Rehab Potential apraxia, impaired sensation, impaired cogntion   OT Frequency 2x / week   OT Duration 4 weeks   OT Treatment/Interventions Fiscus-care/ADL training;Electrical Stimulation;Therapeutic exercise;Cognitive remediation/compensation;Moist Heat;Parrafin;Neuromuscular education;Splinting;Visual/perceptual remediation/compensation;Fluidtherapy;Energy conservation;Therapist, nutritional;Therapeutic exercises;Patient/family education;DME and/or AE instruction;Manual Therapy;Passive range of motion;Therapeutic activities   Plan d/c from OT at this time   Consulted and Agree with Plan of Care Patient;Family member/caregiver   Family Member Consulted HUSBAND          G-Codes - Mar 18, 2015 1245    Functional Assessment Tool Used clinical observation of functional use of RUE, writing legibility   Functional Limitation Carrying, moving and handling objects   Carrying, Moving and Handling Objects Current Status [id number]) At least 20 percent but less than 40 percent impaired, limited or restricted   Carrying, Moving and Handling Objects Goal Status (B6389) At least 20 percent but less than 40 percent impaired, limited or restricted   Carrying, Moving and Handling Objects Discharge Status [phone]) At least 20 percent but less than 40 percent impaired, limited or restricted      Problem List Patient Active Problem List   Diagnosis Date Noted  . Monilial vaginitis 10/23/2014  . Deep vein thrombosis ([place]) 08/12/2014  . History of inferior vena caval filter placement 07/14/2014  . Malignant glioma of brain ([place]) 06/28/2014  . Diabetes mellitus, type 2 ([place]) 06/27/2014  . Focal motor seizure disorder  ([place]) 05/04/2014  . Tremor of both hands 04/05/2014  . Vertigo 04/05/2014  . BPPV (benign paroxysmal positional vertigo) 03/14/2014  . Seizures ([place]) 12/16/2013  . Epigastric pain 11/01/2013  . Fever blister 10/16/2013  . Hypothyroidism 12/13/2012  . Other and unspecified hyperlipidemia 12/13/2012  . Anxiety state, unspecified 12/13/2012  . Edema 12/13/2012  . Anxiety state 12/13/2012  . Adult hypothyroidism 12/13/2012  . Urinary incontinence   . DEPRESSION 09/24/2007  . Essential hypertension 09/24/2007  . GERD 09/24/2007  . HIATAL HERNIA 09/24/2007  . 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 Headache(784.0) 09/24/2007   OCCUPATIONAL THERAPY DISCHARGE SUMMARY  Visits from Start of Care: 38  Current functional level related to goals / functional outcomes: See above status   Remaining deficits: R spastic dominant hemiplegia, decreased functional use of the RUE   Education / Equipment: HEP Plan: Patient agrees to discharge.  Patient goals were met. Patient is being discharged due to meeting the stated rehab goals.  ?????      Quay Burow, OTR/L March 18, 2015, 12:46 PM  [facility] [address] [place], [place], [address] Phone: [phone]   Fax:  [phone]

## 2015-03-11 NOTE — Patient Instructions (Signed)
Home program for Right Arm: Do 1-2 times per day depending on how you feel and how busy the rest of your day is!!  If you are feeling tired just do one time a day and you can always substitute the ball for weights if you need to.   In supine:  1. Hold a 4 pound weight in both hands, palms facing each other.  Start with weight at chest level. Slowly raise weight toward ceiling until elbows are straight then bring weight back to chest. Do 10, rest then do 10 more.   2.  Hold a 3 pound weight in both hands, palms facing each other. Raise weight toward ceiling until elbows are straight. KEEPING ELBOWS STRAIGHT, slowly raise weight over your head as far you as you can go. HOLD FOR SLOW COUNT OF 5, then reach weight toward your feet.  Do 10, rest then do 10 more.   3.  Butterfly - Do 10, rest then do 10 more.   In sitting:  1. Hold ball between your hands.  KEEPING ELBOWS STRAIGHT, slowly raise ball up as far as you can while maintaining good control. Shoot for 7- 10, rest then shoot for 7 - 10 more.  Your form is really important here so rest when you need to!  For your hand - USE IS FUNCTIONALLY!!! The more you use it the better. Work on Counselling psychologist on your hand an orienting your hand appropriately.  ENJOY LIFE!!!  You have worked so hard and have come so far, Kelly Fletcher!!   Stretches to keep your arm from feeling tight:  1. In supine, bring both hands behind your head with elbows pointing to the ceiling.  SLOWLY lower elbows toward the bed. You should feel a stretch but no true pain. Only go as far as your discomfort allows. Hold for slow count of 5 then return to starting position. Do 10 in the morning and 10 in the evening. 2. Can use the one where you sit and reach for the floor then reach forward as a stretch if your arm feel tight. 5 should do the trick!

## 2015-03-21 ENCOUNTER — Other Ambulatory Visit (HOSPITAL_BASED_OUTPATIENT_CLINIC_OR_DEPARTMENT_OTHER): Payer: Medicare Other

## 2015-03-21 ENCOUNTER — Telehealth: Payer: Self-pay | Admitting: *Deleted

## 2015-03-21 ENCOUNTER — Ambulatory Visit (HOSPITAL_BASED_OUTPATIENT_CLINIC_OR_DEPARTMENT_OTHER): Payer: Medicare Other | Admitting: Nurse Practitioner

## 2015-03-21 VITALS — BP 134/67 | HR 56 | Temp 98.6°F | Resp 18 | Ht 69.0 in | Wt 157.4 lb

## 2015-03-21 DIAGNOSIS — C711 Malignant neoplasm of frontal lobe: Secondary | ICD-10-CM

## 2015-03-21 DIAGNOSIS — R531 Weakness: Secondary | ICD-10-CM | POA: Diagnosis not present

## 2015-03-21 DIAGNOSIS — R42 Dizziness and giddiness: Secondary | ICD-10-CM

## 2015-03-21 DIAGNOSIS — R110 Nausea: Secondary | ICD-10-CM | POA: Diagnosis present

## 2015-03-21 DIAGNOSIS — C719 Malignant neoplasm of brain, unspecified: Secondary | ICD-10-CM

## 2015-03-21 LAB — CBC WITH DIFFERENTIAL/PLATELET
BASO%: 0.2 % (ref 0.0–2.0)
BASOS ABS: 0 10*3/uL (ref 0.0–0.1)
EOS ABS: 0.1 10*3/uL (ref 0.0–0.5)
EOS%: 1.7 % (ref 0.0–7.0)
HCT: 34.7 % — ABNORMAL LOW (ref 34.8–46.6)
HGB: 11.9 g/dL (ref 11.6–15.9)
LYMPH%: 20 % (ref 14.0–49.7)
MCH: 34.4 pg — ABNORMAL HIGH (ref 25.1–34.0)
MCHC: 34.3 g/dL (ref 31.5–36.0)
MCV: 100.3 fL (ref 79.5–101.0)
MONO#: 0.3 10*3/uL (ref 0.1–0.9)
MONO%: 7.4 % (ref 0.0–14.0)
NEUT%: 70.7 % (ref 38.4–76.8)
NEUTROS ABS: 2.9 10*3/uL (ref 1.5–6.5)
PLATELETS: 113 10*3/uL — AB (ref 145–400)
RBC: 3.46 10*6/uL — AB (ref 3.70–5.45)
RDW: 12.2 % (ref 11.2–14.5)
WBC: 4.1 10*3/uL (ref 3.9–10.3)
lymph#: 0.8 10*3/uL — ABNORMAL LOW (ref 0.9–3.3)
nRBC: 0 % (ref 0–0)

## 2015-03-21 NOTE — Telephone Encounter (Signed)
Walk in form from patient- having dizzy spells. Just finished Temodar.

## 2015-03-22 ENCOUNTER — Telehealth: Payer: Self-pay | Admitting: *Deleted

## 2015-03-22 ENCOUNTER — Encounter: Payer: Self-pay | Admitting: Nurse Practitioner

## 2015-03-22 NOTE — Assessment & Plan Note (Signed)
Pt completed her 5th cycle of Temodar oral therapy; and is currently on her 13th day of the break between Temodar cycles.  Per [facility] records.-There is a total of 12 cycles of Temodar planned.  Patient reported to the [facility] today with complaint of several day history of episodes of nausea and subsequent increased dizziness.  She is also complaining of a headache on occasion.  Patient states that she is able to lie down; and both the nausea and the dizziness will pass.  She denies any new worsening weakness or numbness/tingling.  She also denies any bladder or stool incontinence.  She denies any recent fevers or chills.  She also denies any recent seizures.  On exam.  Patient continues with expressive aphasia as her baseline.  She also has some mild, chronic right-sided weakness; but it does appear to have improved since her last visit.  Patient states she continues with both physical therapy and occupational therapy on a regular basis.  She does, however, have a slightly increased unsteady balance and walking.  Labs obtained today reveal a WBC of 4.1, ANC 2.9, hemoglobin 11.9, platelet count 113.  [facility]-and spoke to [place], triage nurse regarding patient's visit today.  Patient is scheduled to obtain a restaging brain MRI next Thursday, 03/27/2015; and have a follow-up visit to review the scan results with Dr.Gordana Vlahovic.    Dr. Benay Spice has also examined the patient; and there appears to be no seizure activity or obvious symptoms of a stroke today.  Patient will be allowed to return home with her husband today; while we await communication back from [facility] regarding further follow-up for the patient.  Both patient and her husband were advised to go directly to the emergency department for any seizure activity or worsening symptoms whatsoever.

## 2015-03-22 NOTE — Progress Notes (Signed)
SYMPTOM MANAGEMENT CLINIC   HPI: Kelly Fletcher 64 y.o. female diagnosed with glioblastoma.  Currently undergoing Temodar oral therapy.   Pt completed her 5th cycle of Temodar oral therapy; and is currently on her 13th day of the break between Temodar cycles.  Per [facility] records.-There is a total of 12 cycles of Temodar planned.  Patient reported to the [place] today with complaint of several day history of episodes of nausea and subsequent increased dizziness.  She is also complaining of a headache on occasion.  Patient states that she is able to lie down; and both the nausea and the dizziness will pass.  She denies any new worsening weakness or numbness/tingling.  She also denies any bladder or stool incontinence.  She denies any recent fevers or chills.  She also denies any recent seizures.  On exam.  Patient continues with expressive aphasia as her baseline.  She also has some mild, chronic right-sided weakness; but it does appear to have improved since her last visit.  Patient states she continues with both physical therapy and occupational therapy on a regular basis.  She does, however, have a slightly increased unsteady balance and walking.  Labs obtained today reveal a WBC of 4.1, ANC 2.9, hemoglobin 11.9, platelet count 113.  [facility] cancer Center/brain tumor clinic-and spoke to [place], triage nurse regarding patient's visit today.  Patient is scheduled to obtain a restaging brain MRI next Thursday, 03/27/2015; and have a follow-up visit to review the scan results with Dr.Gordana Vlahovic.    Dr. Benay Spice has also examined the patient; and there appears to be no seizure activity or obvious symptoms of a stroke today.  Patient will be allowed to return home with her husband today; while we await communication back from [facility] regarding further follow-up for the patient.  Both patient and her husband were advised to go directly to the emergency department  for any seizure activity or worsening symptoms whatsoever.  HPI  ROS  Past Medical History  Diagnosis Date  . Uterine prolapse   . Cystocele   . GERD (gastroesophageal reflux disease)   . Elevated cholesterol   . Rectocele   . Hypertension   . Arthritis   . Type II or unspecified type diabetes mellitus without mention of complication, uncontrolled     Type 2  . Urinary incontinence     Urgency  . Anxiety state, unspecified   . Palpitations   . Allergic rhinitis, cause unspecified   . Other and unspecified hyperlipidemia   . Unspecified hypothyroidism   . Edema   . Insomnia, unspecified   . Depressive disorder, not elsewhere classified   . Unspecified urinary incontinence   . Peptic ulcer, unspecified site, unspecified as acute or chronic, without mention of hemorrhage, perforation, or obstruction   . Disturbance of skin sensation   . Calculus of kidney   . Facial spasm 04/05/2014  . Abnormal brain MRI 04/13/2014  . 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  Right face   . Monilial vaginitis 10/23/2014    Past Surgical History  Procedure Laterality Date  . Tonsillectomy and adenoidectomy  1963  . Dilation and curettage of uterus      3-05  . Hysteroscopy      3-05  . Endometrial biopsy      Dr.Gottsegen   . 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    is  allergic to shellfish allergy; ibuprofen; latex; naproxen; nitrofurantoin monohyd macro; paroxetine hcl; prednisone; prozac; and tegretol.    Medication List       This list is accurate as of: 03/21/15 11:59 PM.  Always use your most recent med list.               amoxicillin-clavulanate 500-125 MG tablet  Commonly known as:  AUGMENTIN  Take 1 tablet (500 mg total) by mouth 2 (two) times daily.     atorvastatin 10 MG tablet  Commonly known as:  LIPITOR  ONE DAILY TO CONTROL CHOLESTEROL     b complex vitamins tablet  Take 1 tablet by mouth daily.     cholecalciferol 1000 UNITS tablet  Commonly known as:  VITAMIN D  Take 1,000 Units by mouth daily. Takes 2000-5000mg  daily     cloBAZam 10 MG tablet  Commonly known as:  ONFI  Take 10 mg by mouth at bedtime.     clonazePAM 1 MG tablet  Commonly known as:  KLONOPIN  Take 1 mg by mouth as needed. Take at onset of seizure     docusate sodium 100 MG capsule  Commonly known as:  COLACE  Take 100 mg by mouth 2 (two) times daily.     escitalopram 10 MG tablet  Commonly known as:  LEXAPRO  Take 10 mg by mouth daily. Pt taking 1/2 tablet daily at night     fluconazole 150 MG tablet  Commonly known as:  DIFLUCAN  One daily to treat yeast     lacosamide 200 MG Tabs tablet  Commonly known as:  VIMPAT  Take 200 mg by mouth 2 (two) times daily.     levETIRAcetam 750 MG tablet  Commonly known as:  KEPPRA  Take 1,500 mg by mouth 2 (two) times daily.     levothyroxine 125 MCG tablet  Commonly known as:  SYNTHROID, LEVOTHROID  TAKE 1 TABLET (125 MCG TOTAL) BY MOUTH DAILY BEFORE BREAKFAST.     loratadine 10 MG tablet  Commonly known as:  CLARITIN  Take 10 mg by mouth daily as needed for allergies.     metFORMIN 500 MG 24 hr tablet  Commonly known as:  GLUCOPHAGE-XR  TAKE 1 TABLET BY MOUTH TWICE A DAY TO CONTROL BLOOD SUGAR     MICONAZOLE NITRATE VAGINAL 4 % Crea  Apply one applicator nightly for 3 nights     ondansetron  8 MG tablet  Commonly known as:  ZOFRAN  Take 8 mg by mouth every 8 (eight) hours as needed.     ONE TOUCH ULTRA TEST test strip  Generic drug:  glucose blood  CHECK BLOOD GLUCOSE AS DIRECTED     pantoprazole 40 MG tablet  Commonly known as:  PROTONIX  One daily to reduce stomach acid     pseudoephedrine 30 MG tablet  Commonly known as:  SUDAFED  Take 30 mg by mouth every 4 (four) hours as needed for congestion.     sennosides-docusate sodium 8.6-50 MG tablet  Commonly known as:  SENOKOT-S  Take 1 tablet  by mouth daily.     temozolomide 140 MG capsule  Commonly known as:  TEMODAR  Take 2 capsules (280 mg total) by mouth daily. Total dose= 280 mg for 5 days. May take on an empty stomach or at bedtime. Begin on 03/08/15     valACYclovir 1000 MG tablet  Commonly known as:  VALTREX  Take 1,000 mg by mouth 2 (two) times daily as needed. To help resolve viral blisters         PHYSICAL EXAMINATION  Oncology Vitals 03/21/2015 02/28/2015 12/27/2014 12/25/2014 11/23/2014 11/13/2014 10/23/2014  Height 175 cm 175 cm 175 cm 175 cm 175 cm 175 cm 175 cm  Weight 71.396 kg 72.439 kg 74.39 kg 74.299 kg 74.39 kg 75.796 kg 76.295 kg  Weight (lbs) 157 lbs 6 oz 159 lbs 11 oz 164 lbs 163 lbs 13 oz 164 lbs 167 lbs 2 oz 168 lbs 3 oz  BMI (kg/m2) 23.24 kg/m2 23.58 kg/m2 24.22 kg/m2 24.19 kg/m2 24.22 kg/m2 24.68 kg/m2 24.84 kg/m2  Temp 98.6 98.5 98.4 98.7 97.8 98.7 98.1  Pulse 56 68 62 68 68 76 68  Resp 18 18 18 18 20 18 20   SpO2 100 100 99 97 96 99 95  BSA (m2) 1.86 m2 1.88 m2 1.9 m2 1.9 m2 1.9 m2 1.92 m2 1.93 m2   BP Readings from Last 3 Encounters:  03/21/15 134/67  02/28/15 139/67  01/22/15 134/71    Physical Exam  Constitutional: She is oriented to person, place, and time and well-developed, well-nourished, and in no distress.  HENT:  Head: Normocephalic and atraumatic.  Mouth/Throat: Oropharynx is clear and moist.  Eyes: Conjunctivae and EOM are normal. Pupils are equal, round, and  reactive to light. Right eye exhibits no discharge. Left eye exhibits no discharge. No scleral icterus.  Neck: Normal range of motion. Neck supple. No JVD present. No tracheal deviation present. No thyromegaly present.  Cardiovascular: Normal rate, regular rhythm, normal heart sounds and intact distal pulses.   Pulmonary/Chest: Effort normal and breath sounds normal. No respiratory distress. She has no wheezes. She has no rales. She exhibits no tenderness.  Abdominal: Soft. Bowel sounds are normal. She exhibits no distension and no mass. There is no tenderness. There is no rebound and no guarding.  Musculoskeletal: She exhibits edema. She exhibits no tenderness.  Patient with mild edema to bilateral lower extremities; with the right leg slightly more edematous than the left his baseline.  Patient states that she's had 2 Doppler ultrasounds to the right leg recently; which were negative for DVT.  Patient requested and was given a prescription for compression stockings today.    Lymphadenopathy:    She has no cervical adenopathy.  Neurological: She is alert and oriented to person, place, and time.  Patient continues with expressive aphasia as baseline.  She also has chronic right-sided weakness; but it does appear to be improved.  Patient does have a slightly increased, unsteady walk.  When ambulating.  Skin: Skin is warm and dry. No rash noted. No erythema. There is pallor.  Psychiatric: Affect normal.  Nursing note and vitals reviewed.   LABORATORY DATA:. Appointment on 03/21/2015  Component Date Value Ref Range Status  . WBC 03/21/2015 4.1  3.9 - 10.3 10e3/uL Final  . NEUT# 03/21/2015 2.9  1.5 - 6.5 10e3/uL Final  . HGB 03/21/2015 11.9  11.6 - 15.9 g/dL Final  . HCT 03/21/2015 34.7* 34.8 - 46.6 % Final  . Platelets 03/21/2015 113* 145 - 400 10e3/uL Final  .  MCV 03/21/2015 100.3  79.5 - 101.0 fL Final  . MCH 03/21/2015 34.4* 25.1 - 34.0 pg Final  . MCHC 03/21/2015 34.3  31.5 - 36.0 g/dL  Final  . RBC 03/21/2015 3.46* 3.70 - 5.45 10e6/uL Final  . RDW 03/21/2015 12.2  11.2 - 14.5 % Final  . lymph# 03/21/2015 0.8* 0.9 - 3.3 10e3/uL Final  . MONO# 03/21/2015 0.3  0.1 - 0.9 10e3/uL Final  . Eosinophils Absolute 03/21/2015 0.1  0.0 - 0.5 10e3/uL Final  . Basophils Absolute 03/21/2015 0.0  0.0 - 0.1 10e3/uL Final  . NEUT% 03/21/2015 70.7  38.4 - 76.8 % Final  . LYMPH% 03/21/2015 20.0  14.0 - 49.7 % Final  . MONO% 03/21/2015 7.4  0.0 - 14.0 % Final  . EOS% 03/21/2015 1.7  0.0 - 7.0 % Final  . BASO% 03/21/2015 0.2  0.0 - 2.0 % Final  . nRBC 03/21/2015 0  0 - 0 % Final     RADIOGRAPHIC STUDIES: No results found.  ASSESSMENT/PLAN:    Malignant glioma of brain (H[facility]) Pt completed her 5th cycle of Temodar oral therapy; and is currently on her 13th day of the break between Temodar cycles.  Per [facility] records.-There is a total of 12 cycles of Temodar planned.  Patient reported to the [place] today with complaint of several day history of episodes of nausea and subsequent increased dizziness.  She is also complaining of a headache on occasion.  Patient states that she is able to lie down; and both the nausea and the dizziness will pass.  She denies any new worsening weakness or numbness/tingling.  She also denies any bladder or stool incontinence.  She denies any recent fevers or chills.  She also denies any recent seizures.  On exam.  Patient continues with expressive aphasia as her baseline.  She also has some mild, chronic right-sided weakness; but it does appear to have improved since her last visit.  Patient states she continues with both physical therapy and occupational therapy on a regular basis.  She does, however, have a slightly increased unsteady balance and walking.  Labs obtained today reveal a WBC of 4.1, ANC 2.9, hemoglobin 11.9, platelet count 113.  [facility] cancer Center/brain tumor clinic-and spoke to [place], triage nurse regarding patient's  visit today.  Patient is scheduled to obtain a restaging brain MRI next Thursday, 03/27/2015; and have a follow-up visit to review the scan results with Dr.Gordana Vlahovic.    Dr. Benay Spice has also examined the patient; and there appears to be no seizure activity or obvious symptoms of a stroke today.  Patient will be allowed to return home with her husband today; while we await communication back from [facility] regarding further follow-up for the patient.  Both patient and her husband were advised to go directly to the emergency department for any seizure activity or worsening symptoms whatsoever.  Patient stated understanding of all instructions; and was in agreement with this plan of care. The patient knows to call the clinic with any problems, questions or concerns.   This is a shared visit with Dr. Benay Spice today.  Total time spent with patient was 40 minutes;  with greater than 75 percent of that time spent in face to face counseling regarding patient's symptoms,  and coordination of care and follow up.  Disclaimer:This dictation was prepared with Dragon/digital dictation along with [employer]. Any transcriptional errors that result from this process are unintentional.  Drue Second, NP 03/22/2015  This was a shared visit with Drue Second. Ms. Nantz was interviewed and examined. It is possible her symptoms are related to progression of the GBM, but the intermittent and positional nature of her symptoms argue against this. She will follow-up at [facility] for a restaging MRI as scheduled next week. We will see her sooner if the nausea or "dizziness "progress.  Julieanne Manson, M.D.

## 2015-03-22 NOTE — Telephone Encounter (Signed)
Spoke to pt husband- advised information from [facility] was that pt should keep restagging MRI as planned on next Thursday. Advised pt husband to have her report to ED with any signs of seizure or stroke activity. Husband verbalized an understanding. Reviewed pt current schedule with [facility]. Pt husband appreciated call.

## 2015-03-22 NOTE — Telephone Encounter (Signed)
Received a call back from [place] nurse practitioner this morning regarding patient's new complaints of increased nausea and dizziness.  After reviewing all details of yesterday's visit with the patient.-Nurse practitioner advised that patient should keep her scheduled restaging brain MRI appointment for Thursday, 03/27/2015.  She also advised for the patient to go directly to the emergency department for symptoms of seizure or stroke.  Will call and update patient of my discussion with the [facility] nurse practitioner.

## 2015-03-23 ENCOUNTER — Other Ambulatory Visit: Payer: Self-pay | Admitting: Internal Medicine

## 2015-04-02 ENCOUNTER — Other Ambulatory Visit (HOSPITAL_BASED_OUTPATIENT_CLINIC_OR_DEPARTMENT_OTHER): Payer: Medicare Other

## 2015-04-02 ENCOUNTER — Telehealth: Payer: Self-pay | Admitting: Oncology

## 2015-04-02 ENCOUNTER — Other Ambulatory Visit: Payer: Self-pay | Admitting: *Deleted

## 2015-04-02 ENCOUNTER — Ambulatory Visit (HOSPITAL_BASED_OUTPATIENT_CLINIC_OR_DEPARTMENT_OTHER): Payer: Medicare Other | Admitting: Oncology

## 2015-04-02 VITALS — BP 122/79 | HR 56 | Temp 97.8°F | Resp 18 | Ht 69.0 in | Wt 156.7 lb

## 2015-04-02 DIAGNOSIS — Z86718 Personal history of other venous thrombosis and embolism: Secondary | ICD-10-CM | POA: Diagnosis not present

## 2015-04-02 DIAGNOSIS — I10 Essential (primary) hypertension: Secondary | ICD-10-CM | POA: Diagnosis not present

## 2015-04-02 DIAGNOSIS — E119 Type 2 diabetes mellitus without complications: Secondary | ICD-10-CM

## 2015-04-02 DIAGNOSIS — C711 Malignant neoplasm of frontal lobe: Secondary | ICD-10-CM

## 2015-04-02 DIAGNOSIS — C719 Malignant neoplasm of brain, unspecified: Secondary | ICD-10-CM

## 2015-04-02 DIAGNOSIS — R110 Nausea: Secondary | ICD-10-CM | POA: Diagnosis not present

## 2015-04-02 LAB — COMPREHENSIVE METABOLIC PANEL (CC13)
ALT: 15 U/L (ref 0–55)
ANION GAP: 7 meq/L (ref 3–11)
AST: 13 U/L (ref 5–34)
Albumin: 3.8 g/dL (ref 3.5–5.0)
Alkaline Phosphatase: 55 U/L (ref 40–150)
BILIRUBIN TOTAL: 0.44 mg/dL (ref 0.20–1.20)
BUN: 12.4 mg/dL (ref 7.0–26.0)
CALCIUM: 9.2 mg/dL (ref 8.4–10.4)
CO2: 28 mEq/L (ref 22–29)
CREATININE: 0.7 mg/dL (ref 0.6–1.1)
Chloride: 107 mEq/L (ref 98–109)
EGFR: 86 mL/min/{1.73_m2} — AB (ref >90)
Glucose: 102 mg/dl (ref 70–140)
Potassium: 3.7 mEq/L (ref 3.5–5.1)
Sodium: 142 mEq/L (ref 136–145)
TOTAL PROTEIN: 5.7 g/dL — AB (ref 6.4–8.3)

## 2015-04-02 LAB — CBC WITH DIFFERENTIAL/PLATELET
BASO%: 1.2 % (ref 0.0–2.0)
Basophils Absolute: 0 10*3/uL (ref 0.0–0.1)
EOS ABS: 0.1 10*3/uL (ref 0.0–0.5)
EOS%: 3.1 % (ref 0.0–7.0)
HEMATOCRIT: 36.5 % (ref 34.8–46.6)
HGB: 12.2 g/dL (ref 11.6–15.9)
LYMPH#: 0.9 10*3/uL (ref 0.9–3.3)
LYMPH%: 26.2 % (ref 14.0–49.7)
MCH: 34.4 pg — ABNORMAL HIGH (ref 25.1–34.0)
MCHC: 33.5 g/dL (ref 31.5–36.0)
MCV: 102.7 fL — AB (ref 79.5–101.0)
MONO#: 0.3 10*3/uL (ref 0.1–0.9)
MONO%: 8.5 % (ref 0.0–14.0)
NEUT%: 61 % (ref 38.4–76.8)
NEUTROS ABS: 2.2 10*3/uL (ref 1.5–6.5)
PLATELETS: 123 10*3/uL — AB (ref 145–400)
RBC: 3.55 10*6/uL — AB (ref 3.70–5.45)
RDW: 12.9 % (ref 11.2–14.5)
WBC: 3.6 10*3/uL — AB (ref 3.9–10.3)

## 2015-04-02 MED ORDER — TEMOZOLOMIDE 100 MG PO CAPS: 100.0000 mg | ORAL_CAPSULE | Freq: Every day | ORAL | Status: DC

## 2015-04-02 NOTE — Progress Notes (Signed)
  [place] OFFICE PROGRESS NOTE   Diagnosis: Glioblastoma  INTERVAL HISTORY:   Kelly Fletcher returns as scheduled. She reports the "dizziness "she experienced earlier this month has improved. She has mild nausea. No seizures or new neurologic symptoms. She was seen at [facility] for a restaging evaluation on 03/27/2015. Increased nodular enhancement was noted. The [facility] team recommend switching to daily temozolomide.   Objective:  Vital signs in last 24 hours:  Blood pressure 122/79, pulse 56, temperature 97.8 F (36.6 C), temperature source Oral, resp. rate 18, height _0  (1.753 m), weight 156 lb 11.2 oz (71.079 kg), SpO2 99 %.    HEENT: No thrush or ulcers Resp: Lungs clear bilaterally Cardio: Regular rate and rhythm GI: No hepatosplenomegaly Vascular: The right lower leg is slightly larger than the left side Neuro: Alert, follows commands, 4/5 strength in the right leg, 3+/5 strength in the right arm and hand       Lab Results:  Lab Results  Component Value Date   WBC 3.6* 04/02/2015   HGB 12.2 04/02/2015   HCT 36.5 04/02/2015   MCV 102.7* 04/02/2015   PLT 123* 04/02/2015   NEUTROABS 2.2 04/02/2015     Medications: I have reviewed the patient's current medications.  Assessment/Plan: 1. 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  MGMT unmethylated  IDH1 and IDH2 negative  TERT positive  Initiation of radiation and concurrent temozolomide, 42 days, 08/06/2014  Cycle 1 Adjuvant 5 day temozolomide 10/04/2014  Cycle 2 adjuvant 5 day temozolomide 11/05/2014  Brain MRI 11/29/2014 with no evidence of disease progression  Cycle 3 adjuvant 5 day temozolomide 12/13/2014  Brain MRI 01/31/2015 with no evidence of disease progression  Cycle 4 adjuvant 5 day temozolomide 02/08/2015 (dose reduced due to thrombocytopenia)  Cycle 5 adjuvant 5 day temozolomide beginning 03/08/2015  MRI at [facility] 03/27/2015 with  increased nodular enhancement at the left frontoparietal resection bed compatible with disease progression  Treatment changed to protracted daily temozolomide beginning 04/05/2015  2. History of seizures secondary to #1  3. 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 Venous Doppler 02/01/2015 negative for DVT. IVC filter removed 02/26/2015.  4. 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 Diabetes  6. Hypertension  7. Hyperlipidemia     Disposition:  Kelly Fletcher appears stable. The restaging MRI at [facility] on 03/27/2015 revealed increased nodular enhancement at the resection bed consistent with disease progression. The plan is to begin daily temozolomide on 04/05/2015. She will return for a CBC on 04/19/2015 and an office visit 05/01/2015.  She has received an influenza vaccine and plans to obtain a pneumonia vaccine via her primary physician.  I discussed future treatment options with Kelly Fletcher and her husband including Avastin therapy.  Betsy Coder, MD  04/02/2015  10:31 AM

## 2015-04-02 NOTE — Telephone Encounter (Signed)
Gave patient avs report and appointments for November.  °

## 2015-04-02 NOTE — Telephone Encounter (Signed)
Temodar Rx re-faxed with ICD10 code per pharmacy request.

## 2015-04-17 ENCOUNTER — Ambulatory Visit (INDEPENDENT_AMBULATORY_CARE_PROVIDER_SITE_OTHER): Payer: Medicare Other | Admitting: Internal Medicine

## 2015-04-17 ENCOUNTER — Other Ambulatory Visit: Payer: Self-pay | Admitting: *Deleted

## 2015-04-17 ENCOUNTER — Encounter: Payer: Self-pay | Admitting: Internal Medicine

## 2015-04-17 VITALS — BP 116/82 | HR 61 | Temp 98.2°F | Resp 20 | Wt 156.6 lb

## 2015-04-17 DIAGNOSIS — C719 Malignant neoplasm of brain, unspecified: Secondary | ICD-10-CM | POA: Diagnosis not present

## 2015-04-17 DIAGNOSIS — R609 Edema, unspecified: Secondary | ICD-10-CM

## 2015-04-17 DIAGNOSIS — R569 Unspecified convulsions: Secondary | ICD-10-CM | POA: Diagnosis not present

## 2015-04-17 DIAGNOSIS — I10 Essential (primary) hypertension: Secondary | ICD-10-CM | POA: Diagnosis not present

## 2015-04-17 DIAGNOSIS — E119 Type 2 diabetes mellitus without complications: Secondary | ICD-10-CM | POA: Diagnosis not present

## 2015-04-17 DIAGNOSIS — E039 Hypothyroidism, unspecified: Secondary | ICD-10-CM | POA: Diagnosis not present

## 2015-04-17 DIAGNOSIS — Z23 Encounter for immunization: Secondary | ICD-10-CM

## 2015-04-17 MED ORDER — LANCETS MISC: Status: DC

## 2015-04-17 MED ORDER — LANCETS MISC: Status: AC

## 2015-04-17 NOTE — Telephone Encounter (Signed)
[facility] 

## 2015-04-17 NOTE — Patient Instructions (Signed)
You received the Prevnar vaccine for pneumonia today.

## 2015-04-17 NOTE — Progress Notes (Signed)
Patient ID: Kelly Fletcher, female   DOB: 1950-07-04, 64 y.o.   MRN: [id number]    Facility  [facility]    Place of Service:   OFFICE    Allergies  Allergen Reactions  . Shellfish Allergy Anaphylaxis  . Ibuprofen   . Latex     Skin rash  . Naproxen   . Nitrofurantoin Monohyd Macro   . Paroxetine Hcl   . Prednisone   . Prozac [Fluoxetine Hcl]   . Tegretol [Carbamazepine] Hives    Chief Complaint  Patient presents with  . Medical Management of Chronic Issues    4 month follow-up for DM, Hypothyroidism, Hypertension. Here with husband  . Immunizations    Discuss pneomonia shot would like to get    HPI:   Essential hypertension - controlled  Hypothyroidism, unspecified hypothyroidism type - compensated  Malignant glioma of brain ([place]) - remains under treatment at [facility]. Speech is still affected, but seems improved.  Seizures ([place]) - none since last seen  Type 2 diabetes mellitus without complication, without long-term current use of insulin (HCC) - controlled  Edema, unspecified type - improved  Need for vaccination with 13-polyvalent pneumococcal conjugate vaccine -    Medications: Patient's Medications  New Prescriptions   No medications on file  Previous Medications   ATORVASTATIN (LIPITOR) 10 MG TABLET    ONE DAILY TO CONTROL CHOLESTEROL   B COMPLEX VITAMINS TABLET    Take 1 tablet by mouth daily.   CHOLECALCIFEROL (VITAMIN D) 1000 UNITS TABLET    Take 1,000 Units by mouth daily. Takes 2000-5081m daily   CLOBAZAM (ONFI) 10 MG TABLET    Take 10 mg by mouth at bedtime.   CLONAZEPAM (KLONOPIN) 1 MG TABLET    Take 1 mg by mouth as needed. Take at onset of seizure   DOCUSATE SODIUM (COLACE) 100 MG CAPSULE    Take 100 mg by mouth 2 (two) times daily.   ESCITALOPRAM (LEXAPRO) 10 MG TABLET    Take 10 mg by mouth daily. Pt taking 1/2 tablet daily at night   LACOSAMIDE (VIMPAT) 200 MG TABS TABLET    Take 200 mg by mouth 2 (two) times daily.   LEVETIRACETAM (KEPPRA) 750 MG  TABLET    Take 1,500 mg by mouth 2 (two) times daily.   LEVOTHYROXINE (SYNTHROID, LEVOTHROID) 125 MCG TABLET    TAKE 1 TABLET (125 MCG TOTAL) BY MOUTH DAILY BEFORE BREAKFAST.   LORATADINE (CLARITIN) 10 MG TABLET    Take 10 mg by mouth daily as needed for allergies.   METFORMIN (GLUCOPHAGE-XR) 500 MG 24 HR TABLET    TAKE 1 TABLET BY MOUTH TWICE A DAY TO CONTROL BLOOD SUGAR   ONDANSETRON (ZOFRAN) 8 MG TABLET    Take 8 mg by mouth every 8 (eight) hours as needed.   ONE TOUCH ULTRA TEST TEST STRIP    CHECK BLOOD GLUCOSE AS DIRECTED   PANTOPRAZOLE (PROTONIX) 40 MG TABLET    One daily to reduce stomach acid   PSEUDOEPHEDRINE (SUDAFED) 30 MG TABLET    Take 30 mg by mouth every 4 (four) hours as needed for congestion.   SENNOSIDES-DOCUSATE SODIUM (SENOKOT-S) 8.6-50 MG TABLET    Take 1 tablet by mouth 2 (two) times daily.    TEMOZOLOMIDE (TEMODAR) 100 MG CAPSULE    Take 1 capsule (100 mg total) by mouth at bedtime. May take on an empty stomach or at bedtime to decrease nausea & vomiting.   VALACYCLOVIR (VALTREX) 1000 MG TABLET  Take 1,000 mg by mouth 2 (two) times daily as needed. To help resolve viral blisters  Modified Medications   Modified Medication Previous Medication   LANCETS MISC Lancets MISC      Use as directed    Use as directed  Discontinued Medications   TEMOZOLOMIDE (TEMODAR) 140 MG CAPSULE        Review of Systems  Constitutional: Positive for fatigue. Negative for fever, chills, activity change and appetite change.  HENT: Positive for tinnitus. Negative for ear discharge and ear pain.   Eyes: Negative.  Negative for visual disturbance.  Respiratory: Negative.   Cardiovascular: Negative.   Gastrointestinal: Negative.        Epigastric discomfort  Endocrine: Negative.   Genitourinary: Positive for frequency.  Musculoskeletal: Negative.   Skin: Negative.   Allergic/Immunologic: Negative.   Neurological: Positive for dizziness and seizures (Last seizure was about February  2016.).       Glioblastoma frontal lobes. Status post resection. Has had chemotherapy. Continues to have mild tremor of the right hand, dysarthria, and weakness of the right side more prominent in the upper extremity.  Hematological: Negative.   Psychiatric/Behavioral:       Chronic stress    Filed Vitals:   04/17/15 1241  BP: 116/82  Pulse: 61  Temp: 98.2 F (36.8 C)  TempSrc: Oral  Resp: 20  Weight: 156 lb 9.6 oz (71.033 kg)  SpO2: 96%   Body mass index is 23.12 kg/(m^2).  Physical Exam  Constitutional: She is oriented to person, place, and time. She appears well-developed and well-nourished. No distress.  HENT:  Right Ear: External ear normal.  Left Ear: External ear normal.  Nose: Nose normal.  Mouth/Throat: Oropharynx is clear and moist. No oropharyngeal exudate.  Eyes: Conjunctivae and EOM are normal. Pupils are equal, round, and reactive to light. Left eye exhibits no discharge. No scleral icterus. Right eye exhibits no nystagmus. Left eye exhibits no nystagmus.  Neck: No JVD present. No tracheal deviation present. No thyromegaly present.  Cardiovascular: Normal rate, regular rhythm, normal heart sounds and intact distal pulses.  Exam reveals no gallop and no friction rub.   No murmur heard. Pulses:      Dorsalis pedis pulses are 2+ on the right side, and 2+ on the left side.  Pulmonary/Chest: Effort normal and breath sounds normal. No respiratory distress. She has no wheezes. She has no rales. She exhibits no tenderness.  Abdominal: She exhibits no distension and no mass. There is no tenderness.  Musculoskeletal: Normal range of motion. She exhibits no edema or tenderness.       Right knee: She exhibits swelling and erythema. She exhibits no deformity.       Feet:  Left Lateral posterior and anterior malleolus swelling and TTP with reduced ankle ROM. No calf TTPno achilles tendon TTP. Strength reduced with dorsiflexion R>L. Gait antalgic.  Lymphadenopathy:    She has  no cervical adenopathy.  Neurological: She is alert and oriented to person, place, and time. She has normal reflexes. No cranial nerve deficit. Coordination normal.  Dysarthria. Right hemiparesis. Right arm tremor.  Skin: Skin is warm and dry. No rash noted. She is not diaphoretic. No erythema. No pallor.  Hx of 3 benign appearing moles on labia. No areas suggest melanoma. Large scar left parietal area secondary to previous brain surgery.  Psychiatric: She has a normal mood and affect. Her behavior is normal. Judgment and thought content normal. Her speech is slurred.    Labs reviewed:  Lab Summary Latest Ref Rng 04/02/2015 03/21/2015 03/07/2015 02/28/2015  Hemoglobin 11.6 - 15.9 g/dL 12.2 11.9 12.6 12.4  Hematocrit 34.8 - 46.6 % 36.5 34.7(L) 36.5 37.0  White count 3.9 - 10.3 10e3/uL 3.6(L) 4.1 5.6 4.9  Platelet count 145 - 400 10e3/uL 123(L) 113(L) 126(L) 142(L)  Sodium 136 - 145 mEq/L 142 (None) (None) 143  Potassium 3.5 - 5.1 mEq/L 3.7 (None) (None) 4.1  Calcium 8.4 - 10.4 mg/dL 9.2 (None) (None) 9.0  Phosphorus - (None) (None) (None) (None)  Creatinine 0.6 - 1.1 mg/dL 0.7 (None) (None) 0.9  AST 5 - 34 U/L 13 (None) (None) 14  Alk Phos 40 - 150 U/L 55 (None) (None) 64  Bilirubin 0.20 - 1.20 mg/dL 0.44 (None) (None) 0.38  Glucose 70 - 140 mg/dl 102 (None) (None) 102  Cholesterol - (None) (None) (None) (None)  HDL cholesterol - (None) (None) (None) (None)  Triglycerides - (None) (None) (None) (None)  LDL Direct - (None) (None) (None) (None)  LDL Calc - (None) (None) (None) (None)  Total protein 6.4 - 8.3 g/dL 5.7(L) (None) (None) 6.1(L)  Albumin 3.5 - 5.0 g/dL 3.8 (None) (None) 3.8   Lab Results  Component Value Date   TSH 2.980 01/22/2014   Lab Results  Component Value Date   BUN 12.4 04/02/2015   Lab Results  Component Value Date   HGBA1C 6.0 12/26/2014    Assessment/Plan  1. Essential hypertension controlled - Comprehensive metabolic panel; Future  2.  Hypothyroidism, unspecified hypothyroidism type - TSH; Future  3. Malignant glioma of brain [facility]) continue therapy at [facility]  4. Seizures ([place]) None since last seen. Continue Keppra  5. Type 2 diabetes mellitus without complication, without long-term current use of insulin (HCC) - Hemoglobin A1c; Future - Comprehensive metabolic panel; Future  6. Edema, unspecified type improved  7. Need for vaccination with 13-polyvalent pneumococcal conjugate vaccine - Pneumococcal conjugate vaccine 13-valent IM

## 2015-04-19 ENCOUNTER — Other Ambulatory Visit (HOSPITAL_BASED_OUTPATIENT_CLINIC_OR_DEPARTMENT_OTHER): Payer: Medicare Other

## 2015-04-19 DIAGNOSIS — C719 Malignant neoplasm of brain, unspecified: Secondary | ICD-10-CM | POA: Diagnosis present

## 2015-04-19 LAB — BASIC METABOLIC PANEL
BUN: 13 mg/dL (ref 4–21)
CREATININE: 0.6 mg/dL (ref 0.5–1.1)
Glucose: 104 mg/dL
Potassium: 4.8 mmol/L (ref 3.4–5.3)
SODIUM: 141 mmol/L (ref 137–147)

## 2015-04-19 LAB — CBC WITH DIFFERENTIAL/PLATELET
BASO%: 0.5 % (ref 0.0–2.0)
Basophils Absolute: 0 10*3/uL (ref 0.0–0.1)
EOS ABS: 0.1 10*3/uL (ref 0.0–0.5)
EOS%: 1.9 % (ref 0.0–7.0)
HCT: 35.8 % (ref 34.8–46.6)
HEMOGLOBIN: 12 g/dL (ref 11.6–15.9)
LYMPH%: 15.6 % (ref 14.0–49.7)
MCH: 34.5 pg — ABNORMAL HIGH (ref 25.1–34.0)
MCHC: 33.5 g/dL (ref 31.5–36.0)
MCV: 102.9 fL — ABNORMAL HIGH (ref 79.5–101.0)
MONO#: 0.3 10*3/uL (ref 0.1–0.9)
MONO%: 6.9 % (ref 0.0–14.0)
NEUT%: 75.1 % (ref 38.4–76.8)
NEUTROS ABS: 3.6 10*3/uL (ref 1.5–6.5)
Platelets: 131 10*3/uL — ABNORMAL LOW (ref 145–400)
RBC: 3.48 10*6/uL — AB (ref 3.70–5.45)
RDW: 12.5 % (ref 11.2–14.5)
WBC: 4.8 10*3/uL (ref 3.9–10.3)
lymph#: 0.8 10*3/uL — ABNORMAL LOW (ref 0.9–3.3)

## 2015-04-19 LAB — HEPATIC FUNCTION PANEL
ALK PHOS: 50 U/L (ref 25–125)
ALT: 15 U/L (ref 7–35)
AST: 13 U/L (ref 13–35)
Bilirubin, Total: 0.5 mg/dL

## 2015-04-19 LAB — TSH: TSH: 0.32 u[IU]/mL — AB (ref 0.41–5.90)

## 2015-04-19 LAB — HEMOGLOBIN A1C: HEMOGLOBIN A1C: 5.5 % (ref 4.0–6.0)

## 2015-04-22 ENCOUNTER — Other Ambulatory Visit: Payer: Self-pay

## 2015-05-01 ENCOUNTER — Ambulatory Visit: Payer: Medicare Other

## 2015-05-01 ENCOUNTER — Other Ambulatory Visit: Payer: Self-pay | Admitting: *Deleted

## 2015-05-01 ENCOUNTER — Other Ambulatory Visit (HOSPITAL_BASED_OUTPATIENT_CLINIC_OR_DEPARTMENT_OTHER): Payer: Medicare Other

## 2015-05-01 ENCOUNTER — Telehealth: Payer: Self-pay | Admitting: Oncology

## 2015-05-01 ENCOUNTER — Ambulatory Visit (HOSPITAL_BASED_OUTPATIENT_CLINIC_OR_DEPARTMENT_OTHER): Payer: Medicare Other | Admitting: Oncology

## 2015-05-01 VITALS — BP 125/72 | HR 58 | Temp 97.9°F | Resp 17 | Ht 69.0 in | Wt 154.5 lb

## 2015-05-01 DIAGNOSIS — E039 Hypothyroidism, unspecified: Secondary | ICD-10-CM

## 2015-05-01 DIAGNOSIS — C719 Malignant neoplasm of brain, unspecified: Secondary | ICD-10-CM

## 2015-05-01 DIAGNOSIS — E119 Type 2 diabetes mellitus without complications: Secondary | ICD-10-CM

## 2015-05-01 DIAGNOSIS — C711 Malignant neoplasm of frontal lobe: Secondary | ICD-10-CM

## 2015-05-01 DIAGNOSIS — Z86718 Personal history of other venous thrombosis and embolism: Secondary | ICD-10-CM | POA: Diagnosis not present

## 2015-05-01 DIAGNOSIS — I10 Essential (primary) hypertension: Secondary | ICD-10-CM

## 2015-05-01 DIAGNOSIS — E079 Disorder of thyroid, unspecified: Secondary | ICD-10-CM

## 2015-05-01 LAB — COMPREHENSIVE METABOLIC PANEL (CC13)
ALT: 13 U/L (ref 0–55)
ANION GAP: 4 meq/L (ref 3–11)
AST: 12 U/L (ref 5–34)
Albumin: 3.8 g/dL (ref 3.5–5.0)
Alkaline Phosphatase: 53 U/L (ref 40–150)
BILIRUBIN TOTAL: 0.47 mg/dL (ref 0.20–1.20)
BUN: 15 mg/dL (ref 7.0–26.0)
CALCIUM: 9.1 mg/dL (ref 8.4–10.4)
CHLORIDE: 107 meq/L (ref 98–109)
CO2: 31 meq/L — AB (ref 22–29)
CREATININE: 0.7 mg/dL (ref 0.6–1.1)
EGFR: 86 mL/min/{1.73_m2} — ABNORMAL LOW (ref >90)
Glucose: 106 mg/dl (ref 70–140)
Potassium: 4 mEq/L (ref 3.5–5.1)
Sodium: 142 mEq/L (ref 136–145)
TOTAL PROTEIN: 5.8 g/dL — AB (ref 6.4–8.3)

## 2015-05-01 LAB — T4: T4 TOTAL: 11.3 ug/dL (ref 4.5–12.0)

## 2015-05-01 LAB — CBC WITH DIFFERENTIAL/PLATELET
BASO%: 0.5 % (ref 0.0–2.0)
Basophils Absolute: 0 10*3/uL (ref 0.0–0.1)
EOS%: 2.7 % (ref 0.0–7.0)
Eosinophils Absolute: 0.1 10*3/uL (ref 0.0–0.5)
HEMATOCRIT: 36 % (ref 34.8–46.6)
HEMOGLOBIN: 12.2 g/dL (ref 11.6–15.9)
LYMPH#: 0.9 10*3/uL (ref 0.9–3.3)
LYMPH%: 22.1 % (ref 14.0–49.7)
MCH: 34 pg (ref 25.1–34.0)
MCHC: 33.9 g/dL (ref 31.5–36.0)
MCV: 100.3 fL (ref 79.5–101.0)
MONO#: 0.4 10*3/uL (ref 0.1–0.9)
MONO%: 10 % (ref 0.0–14.0)
NEUT%: 64.7 % (ref 38.4–76.8)
NEUTROS ABS: 2.7 10*3/uL (ref 1.5–6.5)
PLATELETS: 128 10*3/uL — AB (ref 145–400)
RBC: 3.59 10*6/uL — ABNORMAL LOW (ref 3.70–5.45)
RDW: 12.1 % (ref 11.2–14.5)
WBC: 4.1 10*3/uL (ref 3.9–10.3)

## 2015-05-01 MED ORDER — TEMOZOLOMIDE 100 MG PO CAPS: 100.0000 mg | ORAL_CAPSULE | Freq: Every day | ORAL | Status: DC

## 2015-05-01 NOTE — Telephone Encounter (Signed)
per pof to sch pt papt-gave pt copy of avs-sent back to lab

## 2015-05-01 NOTE — Progress Notes (Signed)
  [facility] OFFICE PROGRESS NOTE   Diagnosis: Glioblastoma  INTERVAL HISTORY:   [name] returns as scheduled. She began protracted Temodar 04/05/2015. She denies nausea, mouth sores, and diarrhea. No new neurologic symptoms. She is ambulating without difficulty. She has anxiety.  Objective:  Vital signs in last 24 hours:  Blood pressure 125/72, pulse 58, temperature 97.9 F (36.6 C), temperature source Oral, resp. rate 17, height 5' 9" (1.753 m), weight 154 lb 8 oz (70.081 kg), SpO2 99 %.    HEENT: No thrush or ulcers Resp: Lungs clear bilaterally Cardio: Regular rate and rhythm GI: No hepatomegaly, nontender Vascular: Support stockings in place bilaterally, the right lower leg is slightly larger than left side Neuro: Alert, follows commands, mild weakness of the right arm and hand      Lab Results:  Lab Results  Component Value Date   WBC 4.1 05/01/2015   HGB 12.2 05/01/2015   HCT 36.0 05/01/2015   MCV 100.3 05/01/2015   PLT 128* 05/01/2015   NEUTROABS 2.7 05/01/2015     Medications: I have reviewed the patient's current medications.  Assessment/Plan: 1. 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  MGMT unmethylated  IDH1 and IDH2 negative  TERT positive  Initiation of radiation and concurrent temozolomide, 42 days, 08/06/2014  Cycle 1 Adjuvant 5 day temozolomide 10/04/2014  Cycle 2 adjuvant 5 day temozolomide 11/05/2014  Brain MRI 11/29/2014 with no evidence of disease progression  Cycle 3 adjuvant 5 day temozolomide 12/13/2014  Brain MRI 01/31/2015 with no evidence of disease progression  Cycle 4 adjuvant 5 day temozolomide 02/08/2015 (dose reduced due to thrombocytopenia)  Cycle 5 adjuvant 5 day temozolomide beginning 03/08/2015  MRI at [facility] 03/27/2015 with increased nodular enhancement at the left frontoparietal resection bed compatible with disease progression  Treatment changed  to protracted daily temozolomide beginning 04/05/2015  2. History of seizures secondary to #1  3. 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 Venous Doppler 02/01/2015 negative for DVT. IVC filter removed 02/26/2015.  4. 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 Diabetes  6. Hypertension  7. Hyperlipidemia    Disposition:  She appears stable. She is tolerating the temozolomide well. The plan is to continue daily temozolomide. She will return for a lab visit 05/15/2015 and an office visit 05/29/2015. We will check a T4 level as the TSH was low on 04/19/2015. She is likely euthyroid.    Betsy Coder, MD  05/01/2015  11:40 AM

## 2015-05-02 ENCOUNTER — Telehealth: Payer: Self-pay | Admitting: *Deleted

## 2015-05-02 ENCOUNTER — Telehealth: Payer: Self-pay

## 2015-05-02 NOTE — Telephone Encounter (Signed)
The TSH is close to normal at 0.32. Normal range is greater than 0.41. The T4, which is the active circulating thyroid hormone, is in the normal range. At this time I would not recommend any changes. We should repeat both TSH and T4  approximally 6-8 weeks.

## 2015-05-02 NOTE — Telephone Encounter (Signed)
Pt's husband called "I see the T4 is within the normal range but the TSH was 0.32; what, if anything, do we do for this; what does this represent"  Pt requesting office to call with advise.  Note to Dr. Benay Spice.

## 2015-05-02 NOTE — Telephone Encounter (Signed)
Per Dr. Benay Spice; notified pt's husband MD said just watch for now; nothing to do; T4 is normal.  Pt's husband verbalized understanding and expressed appreciation for call back.

## 2015-05-02 NOTE — Telephone Encounter (Signed)
Husband stopped by office asked if Dr. Nyoka Cowden got the thyroid test results. They don't understand what to do. The TSH 0.32 on 04/19/15 and T4 11.3 on 05/01/15. What should Samreen do? The TSH was low, but T4 normal. Will route message to Dr. Nyoka Cowden.

## 2015-05-03 ENCOUNTER — Telehealth: Payer: Self-pay | Admitting: *Deleted

## 2015-05-03 NOTE — Telephone Encounter (Signed)
Pt left message asking "I have a cold; core throat with drainage; what can I take during the weekend?"  Per Dr. Benay Spice; notified pt that MD said she can take anything over the counter that has worked for her in the past; push fluids.  Pt verbalized understanding and expressed appreciation for call back.

## 2015-05-03 NOTE — Telephone Encounter (Signed)
Spoke with [facility] about TSH T4, will get it done at Dr. Gearldine Shown office at 06/07/15. Will let their office know.

## 2015-05-15 ENCOUNTER — Other Ambulatory Visit (HOSPITAL_BASED_OUTPATIENT_CLINIC_OR_DEPARTMENT_OTHER): Payer: Medicare Other

## 2015-05-15 DIAGNOSIS — C719 Malignant neoplasm of brain, unspecified: Secondary | ICD-10-CM

## 2015-05-15 LAB — CBC WITH DIFFERENTIAL/PLATELET
BASO%: 0.4 % (ref 0.0–2.0)
BASOS ABS: 0 10*3/uL (ref 0.0–0.1)
EOS%: 2 % (ref 0.0–7.0)
Eosinophils Absolute: 0.1 10*3/uL (ref 0.0–0.5)
HCT: 36.1 % (ref 34.8–46.6)
HGB: 12.2 g/dL (ref 11.6–15.9)
LYMPH%: 16.6 % (ref 14.0–49.7)
MCH: 33.6 pg (ref 25.1–34.0)
MCHC: 33.8 g/dL (ref 31.5–36.0)
MCV: 99.4 fL (ref 79.5–101.0)
MONO#: 0.4 10*3/uL (ref 0.1–0.9)
MONO%: 7 % (ref 0.0–14.0)
NEUT#: 3.8 10*3/uL (ref 1.5–6.5)
NEUT%: 74 % (ref 38.4–76.8)
NRBC: 0 % (ref 0–0)
Platelets: 136 10*3/uL — ABNORMAL LOW (ref 145–400)
RBC: 3.63 10*6/uL — AB (ref 3.70–5.45)
RDW: 11.9 % (ref 11.2–14.5)
WBC: 5.1 10*3/uL (ref 3.9–10.3)
lymph#: 0.9 10*3/uL (ref 0.9–3.3)

## 2015-05-29 ENCOUNTER — Telehealth: Payer: Self-pay | Admitting: Oncology

## 2015-05-29 ENCOUNTER — Ambulatory Visit: Payer: Medicare Other | Admitting: Oncology

## 2015-05-29 ENCOUNTER — Other Ambulatory Visit: Payer: Medicare Other

## 2015-05-29 NOTE — Telephone Encounter (Signed)
Returned call re message patient left to cx 12/28 appointments. Per husband not r/s at this time due to patient has had mri - started a new tx at [facility] and advised to go ahead and cx appointment here. Per husband he believes [facility] has communicated this to our office or has updated the information in [employer]. Husband asked to call to schedule should patient need an appointment with Dr. Benay Spice. Left message for desk nurse.

## 2015-06-02 ENCOUNTER — Other Ambulatory Visit: Payer: Self-pay | Admitting: Neurology

## 2015-06-20 ENCOUNTER — Other Ambulatory Visit: Payer: Self-pay | Admitting: Internal Medicine

## 2015-06-21 ENCOUNTER — Other Ambulatory Visit: Payer: Self-pay

## 2015-07-23 ENCOUNTER — Encounter: Payer: Self-pay | Admitting: Physical Therapy

## 2015-07-23 ENCOUNTER — Ambulatory Visit: Payer: Medicare Other

## 2015-07-23 ENCOUNTER — Ambulatory Visit: Payer: Medicare Other | Attending: Nurse Practitioner | Admitting: Physical Therapy

## 2015-07-23 DIAGNOSIS — R279 Unspecified lack of coordination: Secondary | ICD-10-CM | POA: Diagnosis present

## 2015-07-23 DIAGNOSIS — R29818 Other symptoms and signs involving the nervous system: Secondary | ICD-10-CM | POA: Diagnosis present

## 2015-07-23 DIAGNOSIS — R4701 Aphasia: Secondary | ICD-10-CM | POA: Diagnosis present

## 2015-07-23 DIAGNOSIS — Z7409 Other reduced mobility: Secondary | ICD-10-CM | POA: Diagnosis present

## 2015-07-23 DIAGNOSIS — R482 Apraxia: Secondary | ICD-10-CM

## 2015-07-23 DIAGNOSIS — G8191 Hemiplegia, unspecified affecting right dominant side: Secondary | ICD-10-CM

## 2015-07-23 DIAGNOSIS — R2689 Other abnormalities of gait and mobility: Secondary | ICD-10-CM

## 2015-07-23 DIAGNOSIS — R269 Unspecified abnormalities of gait and mobility: Secondary | ICD-10-CM

## 2015-07-23 DIAGNOSIS — R2681 Unsteadiness on feet: Secondary | ICD-10-CM | POA: Diagnosis present

## 2015-07-23 DIAGNOSIS — R4189 Other symptoms and signs involving cognitive functions and awareness: Secondary | ICD-10-CM | POA: Diagnosis present

## 2015-07-23 DIAGNOSIS — R6889 Other general symptoms and signs: Secondary | ICD-10-CM | POA: Insufficient documentation

## 2015-07-23 DIAGNOSIS — R531 Weakness: Secondary | ICD-10-CM | POA: Diagnosis present

## 2015-07-23 DIAGNOSIS — R278 Other lack of coordination: Secondary | ICD-10-CM | POA: Diagnosis present

## 2015-07-23 DIAGNOSIS — R29898 Other symptoms and signs involving the musculoskeletal system: Secondary | ICD-10-CM

## 2015-07-23 NOTE — Therapy (Signed)
[name] [address] [place], [place], [address] Phone: [phone]   Fax:  [phone]  Speech Language Pathology Evaluation  Patient Details  Name: Kelly Fletcher MRN: [id number] Date of Birth: 1950-10-27 Referring Provider: Edwin Cap, FNP  Encounter Date: 07/23/2015      End of Session - 07/23/15 1622    Visit Number 1   Number of Visits 17   Date for SLP Re-Evaluation 09/20/15   SLP Start Time [id number]   SLP Stop Time  1615   SLP Time Calculation (min) 44 min   Activity Tolerance Patient tolerated treatment well  tearful at times      Past Medical History  Diagnosis Date  . Uterine prolapse   . Cystocele   . GERD (gastroesophageal reflux disease)   . Elevated cholesterol   . Rectocele   . Hypertension   . Arthritis   . Type II or unspecified type diabetes mellitus without mention of complication, uncontrolled     Type 2  . Urinary incontinence     Urgency  . Anxiety state, unspecified   . Palpitations   . Allergic rhinitis, cause unspecified   . Other and unspecified hyperlipidemia   . Unspecified hypothyroidism   . Edema   . Insomnia, unspecified   . Depressive disorder, not elsewhere classified   . Unspecified urinary incontinence   . Peptic ulcer, unspecified site, unspecified as acute or chronic, without mention of hemorrhage, perforation, or obstruction   . Disturbance of skin sensation   . Calculus of kidney   . Facial spasm 04/05/2014  . Abnormal brain MRI 04/13/2014  . 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  Right face   . Monilial vaginitis 10/23/2014    Past Surgical History  Procedure Laterality Date  . Tonsillectomy and adenoidectomy  1963  . Dilation and curettage of uterus      3-05  . Hysteroscopy      3-05  . Endometrial biopsy      Dr.Gottsegen   . Tongue biopsy      There were no vitals filed for this  visit.  Visit Diagnosis: Verbal apraxia  Expressive aphasia      Subjective Assessment - 07/23/15 1535    Subjective Speech is worse when more tired. Husband states MD told them processing/ability to think of message will be different.   Patient is accompained by: Family member  husband   Currently in Pain? No/denies            SLP Evaluation [facility] - 07/23/15 1535    SLP Visit Information   SLP Received On 07/23/15   Referring Provider Edwin Cap, FNP   Subjective   Subjective Pt's husband states losing track of thought of what to say is different than with d/c in July 2016.   General Information   HPI Pt with confirmation of advancement of lesion size and began chemo sessions -one day every two weeks- pt on her fourth cycle upcoming, with MRI imaging between. April 12th final cycle.    Prior Functional Status   Cognitive/Linguistic Baseline Baseline deficits   Baseline deficit details mod apraxia predominantly, with mild aphasia and cognitive deficits, all exacerbated by fatigue and anxiety   Cognition   Overall Cognitive Status History of cognitive impairments - at baseline   Attention Selective  reports if she looks away, speaks more fluently   Selective Attention Impaired  Selective Attention Impairment --  language output does improve slightly without eye contact   Behaviors Lability   Auditory Comprehension   Overall Auditory Comprehension Appears within functional limits for tasks assessed   Verbal Expression   Overall Verbal Expression Impaired at baseline   Automatic Speech Day of week;Month of year  aphasia/perseveration   Level of Generative/Spontaneous Verbalization Conversation   Naming Impairment   Divergent --  semantic cues in concrete category for > average 4 items   Interfering Components Attention;Premorbid deficit   Effective Techniques Semantic cues  rate reduction   Other Verbal Expression Comments Conversation (simple) was nearly  functional depending upon pt anxiety/emotion. More complex conversation was non-functional.    Oral Motor/Sensory Function   Overall Oral Motor/Sensory Function Impaired at baseline   Labial Strength Within Functional Limits   Labial Coordination Reduced   Lingual Strength Reduced   Lingual Sensation Within Functional Limits   Lingual Coordination Reduced   Velum Within Functional Limits   Overall Oral Motor/Sensory Function Pt with impaired, labored alternate motion tasks with lingual/labial musculature   Motor Speech   Articulation Impaired   Level of Impairment Word   Intelligibility Intelligible   Motor Planning Impaired   Level of Impairment Word   Motor Speech Errors Groping for words;Consistent;Aware   Interfering Components Premorbid status   Effective Techniques Slow rate;Pause                         SLP Education - 07/23/15 1622    Education provided Yes   Education Details course of treatment (ST)   Person(s) Educated Patient;Spouse   Methods Explanation   Comprehension Verbalized understanding          SLP Short Term Goals - 07/23/15 1628    SLP SHORT TERM GOAL #1   Title pt will complete HEP for motor speech planning with slow rate 90% success with rare verbal cues   Time 4   Period Weeks   Status New   SLP SHORT TERM GOAL #2   Title pt will produce sentences using slow rate 18/20 opportunities   Time 4   Period Weeks   Status New   SLP SHORT TERM GOAL #3   Title pt will demo simplified language in mod complex sentence responses 75% of the time   Time 4   Period Weeks   Status New          SLP Long Term Goals - 07/23/15 1630    SLP LONG TERM GOAL #1   Title pt will complete HEP for motor speech planning with slow rate 95% success   Time 8   Period Weeks   Status New   SLP LONG TERM GOAL #2   Title pt will complete 8 minutes of mod complex conversation using simplified language and other compensations (slow rate, etc) PRN    Time 8   Period Weeks   Status New          Plan - 07/23/15 1625    Clinical Impression Statement Pt presents with slightly improved verbal apraxia than discharge in July 2016, but slightly decr'd verbal expression skills. Overall verbal output decr's as linguistic complexity increases. Pt states she can generate her message more efficiently when she does not look at her listener. She expresses frustration at inability to communicate. Skilled ST is recommended x2 week for 8 weeks with assessment after 4 weeks for efficacy of therapeutic intervention.    Speech Therapy  Frequency 2x / week   Duration --  8 weeks   Treatment/Interventions SLP instruction and feedback;Compensatory strategies;Patient/family education;Cueing hierarchy   Potential to Achieve Goals Fair   Potential Considerations Severity of impairments;Co-morbidities;Previous level of function   Consulted and Agree with Plan of Care Patient          G-Codes - 17-Aug-2015 1634    Functional Assessment Tool Used noms 4 (50-55% impaired)   Functional Limitations Motor speech   Motor Speech Current Status [phone]) At least 40 percent but less than 60 percent impaired, limited or restricted   Motor Speech Goal Status [id number]) At least 40 percent but less than 60 percent impaired, limited or restricted      Problem List Patient Active Problem List   Diagnosis Date Noted  . Thyroid disease 05/01/2015  . Monilial vaginitis 10/23/2014  . Simple partial seizure, consciousness not impaired ([place]) 09/11/2014  . Deep vein thrombosis ([place]) 08/12/2014  . Acute embolism and thrombosis of deep vein of lower extremity ([place]) 08/12/2014  . History of inferior vena caval filter placement 07/14/2014  . Status post insertion of inferior vena caval filter 07/14/2014  . Malignant glioma of brain ([place]) 06/28/2014  . Diabetes mellitus, type 2 ([name]) 06/27/2014  . Type 2 diabetes mellitus ([place]) 06/27/2014  . 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 Tremor of both hands 04/05/2014  . Vertigo 04/05/2014  . BPPV (benign paroxysmal positional vertigo) 03/14/2014  . Seizures ([place]) 12/16/2013  . Epigastric pain 11/01/2013  . Fever blister 10/16/2013  . Hypothyroidism 12/13/2012  . Other and unspecified hyperlipidemia 12/13/2012  . Anxiety state, unspecified 12/13/2012  . Edema 12/13/2012  . Anxiety state 12/13/2012  . Adult hypothyroidism 12/13/2012  . Urinary incontinence   . DEPRESSION 09/24/2007  . Essential hypertension 09/24/2007  . GERD 09/24/2007  . HIATAL HERNIA 09/24/2007  . 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 Headache(784.0) 09/24/2007    Shya Kovatch ,[place], [place]  2015/08/17, 4:35 PM  [facility] [address] [place], [place], [address] Phone: [phone]   Fax:  [phone]  Name: Kelly Fletcher MRN: [id number] Date of Birth: March 21, 1951

## 2015-07-24 ENCOUNTER — Ambulatory Visit (INDEPENDENT_AMBULATORY_CARE_PROVIDER_SITE_OTHER): Payer: Medicare Other | Admitting: Internal Medicine

## 2015-07-24 ENCOUNTER — Encounter: Payer: Self-pay | Admitting: Internal Medicine

## 2015-07-24 VITALS — BP 116/76 | HR 66 | Temp 98.1°F | Resp 20 | Ht 69.0 in | Wt 151.8 lb

## 2015-07-24 DIAGNOSIS — E039 Hypothyroidism, unspecified: Secondary | ICD-10-CM

## 2015-07-24 DIAGNOSIS — R269 Unspecified abnormalities of gait and mobility: Secondary | ICD-10-CM

## 2015-07-24 DIAGNOSIS — E119 Type 2 diabetes mellitus without complications: Secondary | ICD-10-CM | POA: Diagnosis not present

## 2015-07-24 DIAGNOSIS — R569 Unspecified convulsions: Secondary | ICD-10-CM | POA: Diagnosis not present

## 2015-07-24 DIAGNOSIS — C719 Malignant neoplasm of brain, unspecified: Secondary | ICD-10-CM | POA: Diagnosis not present

## 2015-07-24 DIAGNOSIS — G8191 Hemiplegia, unspecified affecting right dominant side: Secondary | ICD-10-CM | POA: Diagnosis not present

## 2015-07-24 NOTE — Patient Instructions (Signed)
Do not start the lisinopril. Current BP is 116/76.

## 2015-07-24 NOTE — Progress Notes (Signed)
Patient ID: Kelly Fletcher, female   DOB: September 15, 1950, 65 y.o.   MRN: [id number]    Facility  [facility]    Place of Service:   OFFICE    Allergies  Allergen Reactions  . Shellfish Allergy Anaphylaxis  . Ibuprofen   . Latex     Skin rash  . Naproxen   . Nitrofurantoin Monohyd Macro   . Paroxetine Hcl   . Prednisone   . Prozac [Fluoxetine Hcl]   . Tegretol [Carbamazepine] Hives    Chief Complaint  Patient presents with  . Medical Management of Chronic Issues    3 month follow-up for routine visit  . OTHER    Husband in room with patient he has qustions about patients medications    HPI:  Continues to be treated and followed at [facility] for her brain cancer. She is now getting Avastin with Iriotecan. Treatments are preceded with Aloxi to reduce potential for nausea. There was evidence on the last MRI of the brain of some reduction in flare. This could've been attributed to improvement due to the therapy or due to the use of Avastin.  Patient is much the same as when I last saw her in regards to her physical status. There is a weakness on the right side in particular in the arm, but also in the leg. She says she has a tendency to drag her to the right foot. The right knee is weak and feels that it will buckle underneath her. She feels that she is at risk for falls. She previously had used a walker and quad cane, but has quit those aside because it made her feel disabled. She understands that she is at risk for falling.  Appetite remains good.  Speech is somewhat difficult.  She is being set up for another round of physical therapy, occupational therapy, and speech therapy by her doctors at [facility]. She will receive this locally.  At the time of her last treatments at [facility], she had an elevated blood pressure of about 160/100. She was advised to start lisinopril 10 mg daily. She has not filled this prescription. Her husband says all her pressures at home have been good, and is good at our office  today. Mr. and Mrs. Leduc attribute the elevated blood pressure to anxiety about starting any chemotherapy treatments. They're asking if she really needs to start this medication.  Medications: Patient's Medications  New Prescriptions   No medications on file  Previous Medications   ATORVASTATIN (LIPITOR) 10 MG TABLET    ONE DAILY TO CONTROL CHOLESTEROL   B COMPLEX VITAMINS TABLET    Take 1 tablet by mouth daily.   CHOLECALCIFEROL (VITAMIN D) 1000 UNITS TABLET    Take 1,000 Units by mouth daily. Takes 2000-5010m daily   CLOBAZAM (ONFI) 10 MG TABLET    Take 10 mg by mouth at bedtime.   CLONAZEPAM (KLONOPIN) 1 MG TABLET    Take 1 mg by mouth as needed. Take at onset of seizure   DICLOFENAC SODIUM (VOLTAREN) 1 % GEL    Apply topically.   DOCUSATE SODIUM (COLACE) 100 MG CAPSULE    Take 100 mg by mouth 2 (two) times daily.   ESCITALOPRAM (LEXAPRO) 10 MG TABLET    Take 10 mg by mouth daily. Pt taking 1/2 tablet daily at night   LACOSAMIDE (VIMPAT) 200 MG TABS TABLET    Take 200 mg by mouth 2 (two) times daily.   LANCETS MISC    Use as directed.  E11.9   LEVETIRACETAM (KEPPRA) 750 MG TABLET    TAKE 2 TABLETS BY MOUTH TWICE A DAY   LEVOTHYROXINE (SYNTHROID, LEVOTHROID) 125 MCG TABLET    TAKE 1 TABLET (125 MCG TOTAL) BY MOUTH DAILY BEFORE BREAKFAST.   LISINOPRIL (PRINIVIL,ZESTRIL) 10 MG TABLET    Take by mouth.   LORATADINE (CLARITIN) 10 MG TABLET    Take 10 mg by mouth daily as needed for allergies.   METFORMIN (GLUCOPHAGE-XR) 500 MG 24 HR TABLET    TAKE 1 TABLET BY MOUTH TWICE A DAY TO CONTROL BLOOD SUGAR   ONDANSETRON (ZOFRAN) 8 MG TABLET    Take 8 mg by mouth every 8 (eight) hours as needed.   ONE TOUCH ULTRA TEST TEST STRIP    CHECK BLOOD GLUCOSE AS DIRECTED   PANTOPRAZOLE (PROTONIX) 40 MG TABLET    One daily to reduce stomach acid   PSEUDOEPHEDRINE (SUDAFED) 30 MG TABLET    Take 30 mg by mouth every 4 (four) hours as needed for congestion.   SENNOSIDES-DOCUSATE SODIUM (SENOKOT-S) 8.6-50 MG  TABLET    Take 1 tablet by mouth 2 (two) times daily.    TEMOZOLOMIDE (TEMODAR) 100 MG CAPSULE    Take 1 capsule (100 mg total) by mouth at bedtime. May take on an empty stomach or at bedtime to decrease nausea & vomiting.   VALACYCLOVIR (VALTREX) 1000 MG TABLET    Take 1,000 mg by mouth 2 (two) times daily as needed. To help resolve viral blisters  Modified Medications   No medications on file  Discontinued Medications   No medications on file    Review of Systems  Constitutional: Positive for fatigue. Negative for fever, chills, activity change and appetite change.  HENT: Positive for tinnitus. Negative for ear discharge and ear pain.   Eyes: Negative.  Negative for visual disturbance.  Respiratory: Negative.   Cardiovascular: Negative.   Gastrointestinal: Negative.        Epigastric discomfort  Endocrine: Negative.   Genitourinary: Positive for frequency.  Musculoskeletal: Positive for gait problem.       Right-sided weakness and weaker in the right knee. Mild right foot drop. At risk for falls.  Skin: Negative.   Allergic/Immunologic: Negative.   Neurological: Positive for dizziness and seizures (Last seizure was about February 2016.).       Glioblastoma frontal lobes. Status post resection. Has had chemotherapy. Continues to have mild tremor of the right hand, dysarthria, and weakness of the right side more prominent in the upper extremity.  Hematological: Negative.   Psychiatric/Behavioral:       Chronic stress    Filed Vitals:   07/24/15 1201  BP: 116/76  Pulse: 66  Temp: 98.1 F (36.7 C)  TempSrc: Oral  Resp: 20  Height: 5' 9"  (1.753 m)  Weight: 151 lb 12.8 oz (68.856 kg)  SpO2: 97%   Body mass index is 22.41 kg/(m^2). Filed Weights   07/24/15 1201  Weight: 151 lb 12.8 oz (68.856 kg)     Physical Exam  Constitutional: She is oriented to person, place, and time. She appears well-developed and well-nourished. No distress.  HENT:  Right Ear: External ear  normal.  Left Ear: External ear normal.  Nose: Nose normal.  Mouth/Throat: Oropharynx is clear and moist. No oropharyngeal exudate.  Eyes: Conjunctivae and EOM are normal. Pupils are equal, round, and reactive to light. Left eye exhibits no discharge. No scleral icterus. Right eye exhibits no nystagmus. Left eye exhibits no nystagmus.  Neck: No JVD present.  No tracheal deviation present. No thyromegaly present.  Cardiovascular: Normal rate, regular rhythm, normal heart sounds and intact distal pulses.  Exam reveals no gallop and no friction rub.   No murmur heard. Pulses:      Dorsalis pedis pulses are 2+ on the right side, and 2+ on the left side.  Pulmonary/Chest: Effort normal and breath sounds normal. No respiratory distress. She has no wheezes. She has no rales. She exhibits no tenderness.  Abdominal: She exhibits no distension and no mass. There is no tenderness.  Musculoskeletal: Normal range of motion. She exhibits no edema or tenderness.       Right knee: She exhibits no swelling, no deformity and no erythema.  Mild right foot drop. Weakness in the right leg and right arm. Diminished grip strength on the right side.  Lymphadenopathy:    She has no cervical adenopathy.  Neurological: She is alert and oriented to person, place, and time. She has normal reflexes. No cranial nerve deficit. Coordination normal.  Dysarthria. Right hemiparesis. Right arm tremor.  Skin: Skin is warm and dry. No rash noted. She is not diaphoretic. No erythema. No pallor.  Hx of 3 benign appearing moles on labia. No areas suggest melanoma. Large scar left parietal area secondary to previous brain surgery.  Psychiatric: She has a normal mood and affect. Her behavior is normal. Judgment and thought content normal. Her speech is slurred.    Labs reviewed: Lab Summary Latest Ref Rng 05/15/2015 05/01/2015 04/19/2015 04/02/2015  Hemoglobin 11.6 - 15.9 g/dL 12.2 12.2 12.0 12.2  Hematocrit 34.8 - 46.6 % 36.1 36.0  35.8 36.5  White count 3.9 - 10.3 10e3/uL 5.1 4.1 4.8 3.6(L)  Platelet count 145 - 400 10e3/uL 136(L) 128(L) 131(L) 123(L)  Sodium 136 - 145 mEq/L (None) 142 141 142  Potassium 3.5 - 5.1 mEq/L (None) 4.0 4.8 3.7  Calcium 8.4 - 10.4 mg/dL (None) 9.1 (None) 9.2  Phosphorus - (None) (None) (None) (None)  Creatinine 0.6 - 1.1 mg/dL (None) 0.7 0.6 0.7  AST 5 - 34 U/L (None) 12 13 13   Alk Phos 40 - 150 U/L (None) 53 50 55  Bilirubin 0.20 - 1.20 mg/dL (None) 0.47 (None) 0.44  Glucose 70 - 140 mg/dl (None) 106 104 102  Cholesterol - (None) (None) (None) (None)  HDL cholesterol - (None) (None) (None) (None)  Triglycerides - (None) (None) (None) (None)  LDL Direct - (None) (None) (None) (None)  LDL Calc - (None) (None) (None) (None)  Total protein 6.4 - 8.3 g/dL (None) 5.8(L) (None) 5.7(L)  Albumin 3.5 - 5.0 g/dL (None) 3.8 (None) 3.8   Lab Results  Component Value Date   TSH 0.32* 04/19/2015   TSH 2.980 01/22/2014   TSH 2.630 10/25/2013   T4TOTAL 11.3 05/01/2015   Lab Results  Component Value Date   BUN 15.0 05/01/2015   BUN 13 04/19/2015   BUN 12.4 04/02/2015   Lab Results  Component Value Date   HGBA1C 5.5 04/19/2015   HGBA1C 6.0 12/26/2014   HGBA1C 6.0* 01/22/2014    Assessment/Plan  1. Type 2 diabetes mellitus without complication, without long-term current use of insulin (HCC) Controlled control  2. Hypothyroidism, unspecified hypothyroidism type Follow-up lab future visit  3. Malignant glioma of brain [facility]) Currently under chemotherapy at [facility]  4. Seizures ([place]) Still having seizures off and on despite current medications. They're jerking episodes of the right arm. She denies problems with facial muscles.  5. Abnormality of gait Due to weakness of the right  side she may benefit from a knee brace with lateral and medial supports of the knee as well as a quad cane. She is to discuss his physical therapy  6. Right hemiparesis ([place]) Complication of glioma and  subsequent surgery

## 2015-07-24 NOTE — Therapy (Signed)
[facility] [address] [place], [place], [address] Phone: [phone]   Fax:  [phone]  Physical Therapy Evaluation  Patient Details  Name: Kelly Fletcher MRN: [id number] Date of Birth: Apr 03, 1951 Referring Provider: Ladoris Gene, MD  Encounter Date: 07/23/2015      PT End of Session - 07/23/15 1530    Visit Number 1   Number of Visits 17   Date for PT Re-Evaluation 09/20/15   Authorization Type Medicare G-Code & Progress Note every 10th visit   PT Start Time 1445   PT Stop Time 1530   PT Time Calculation (min) 45 min   Equipment Utilized During Treatment Gait belt   Activity Tolerance Patient tolerated treatment well   Behavior During Therapy [facility] for tasks assessed/performed      Past Medical History  Diagnosis Date  . Uterine prolapse   . Cystocele   . GERD (gastroesophageal reflux disease)   . Elevated cholesterol   . Rectocele   . Hypertension   . Arthritis   . Type II or unspecified type diabetes mellitus without mention of complication, uncontrolled     Type 2  . Urinary incontinence     Urgency  . Anxiety state, unspecified   . Palpitations   . Allergic rhinitis, cause unspecified   . Other and unspecified hyperlipidemia   . Unspecified hypothyroidism   . Edema   . Insomnia, unspecified   . Depressive disorder, not elsewhere classified   . Unspecified urinary incontinence   . Peptic ulcer, unspecified site, unspecified as acute or chronic, without mention of hemorrhage, perforation, or obstruction   . Disturbance of skin sensation   . Calculus of kidney   . Facial spasm 04/05/2014  . Abnormal brain MRI 04/13/2014  . 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  Right face   . Monilial vaginitis 10/23/2014    Past Surgical History  Procedure Laterality Date  . Tonsillectomy and adenoidectomy  1963  . Dilation and curettage of  uterus      3-05  . Hysteroscopy      3-05  . Endometrial biopsy      Dr.Gottsegen   . Tongue biopsy      There were no vitals filed for this visit.  Visit Diagnosis:  Hemiplegia affecting right dominant side (HCC)  Balance problems  Abnormality of gait  Decreased functional activity tolerance  Unsteadiness  Right leg weakness  Coordination abnormal  Lack of coordination      Subjective Assessment - 07/23/15 1450    Subjective This 65yo female was diagnosed with Glioblastoma in Nov 2015 with surgery 06/29/14. She started a new chemotherapy infusion 05/22/2015 (gets treatment every other week with MRI after 4th treatments) due to radiographic progression with enlargement of multiple lesions in the L frontal & Lparietal leson.    Patient is accompained by: Family member   Pertinent History Hypothyroidism, DM2, HTN, depression, DVT   Limitations Standing;Walking;House hold activities   Patient Stated Goals Walk and talk better.    Currently in Pain? No/denies            [facility] PT Assessment - 07/23/15 1445    Assessment   Medical Diagnosis Glioblastoma   Referring Provider Ladoris Gene, MD   Onset Date/Surgical Date 05/22/15  progression of lesions   Prior Therapy PT, OT & speech   Precautions   Precautions Fall  no not bend over &  raise up quickly   Restrictions   Weight Bearing Restrictions No   Balance Screen   Has the patient fallen in the past 6 months Yes   How many times? 1  fell back on toilet & bruised buttocks   Has the patient had a decrease in activity level because of a fear of falling?  No   Is the patient reluctant to leave their home because of a fear of falling?  No   Home Environment   Living Environment Private residence   Living Arrangements Spouse/significant other   Type of [place] to enter   Entrance Stairs-Number of Steps 4   Entrance Stairs-Rails None   Home Layout One level   Prior Function   Level  of Independence Independent;Independent with household mobility without device;Independent with community mobility without device;Independent with gait   Vocation Retired   Leisure enjoys spending with 4yo granddaughter   Observation/Other Assessments   Focus on Therapeutic Outcomes (FOTO)  46 Functional Status   45 Risk Adjusted    Neuro Quality of Life  40.1%   Fear Avoidance Belief Questionnaire (FABQ)  19 (5)   Coordination   Gross Motor Movements are Fluid and Coordinated No   Fine Motor Movements are Fluid and Coordinated No   Heel Shin Test slowed & dysmetria    Posture/Postural Control   Posture/Postural Control Postural limitations   Postural Limitations Rounded Shoulders;Weight shift right   ROM / Strength   AROM / PROM / Strength Strength;PROM   PROM   Overall PROM  Within functional limits for tasks performed   Strength   Overall Strength Deficits   Strength Assessment Site Hip;Knee;Ankle   Right/Left Hip Right;Left   Right Hip Flexion 5/5   Right Hip Extension 3+/5   Right Hip ABduction 4/5   Left Hip Flexion 3/5   Left Hip Extension 2+/5   Left Hip ABduction 3-/5   Right/Left Knee Right;Left   Right Knee Flexion 4/5   Right Knee Extension 5/5   Left Knee Flexion 3-/5   Left Knee Extension 3+/5   Right/Left Ankle Right;Left   Right Ankle Dorsiflexion 5/5   Right Ankle Plantar Flexion 5/5   Left Ankle Dorsiflexion 3-/5   Left Ankle Plantar Flexion 3-/5   Transfers   Transfers Sit to Stand;Stand to Sit   Sit to Stand 5: Supervision;With upper extremity assist;With armrests;From chair/3-in-1  supervision with stabilization, back of legs against chair   Stand to Sit 5: Supervision;With upper extremity assist;With armrests;To bed  uses back of legs against chair to control   Ambulation/Gait   Ambulation/Gait Yes   Ambulation/Gait Assistance 4: Min assist   Ambulation Distance (Feet) 200 Feet   Assistive device None   Gait Pattern Step-to pattern;Decreased arm  swing - left;Decreased step length - right;Decreased stance time - left;Decreased stride length;Decreased hip/knee flexion - left;Decreased dorsiflexion - left;Decreased weight shift to left;Left hip hike;Trunk rotated posteriorly on left;Abducted - left;Poor foot clearance - left   Gait velocity 1.56 ft/sec   Standardized Balance Assessment   Standardized Balance Assessment Berg Balance Test;Timed Up and Go Test   Berg Balance Test   Sit to Stand Needs minimal aid to stand or to stabilize   Standing Unsupported Able to stand 2 minutes with supervision   Sitting with Back Unsupported but Feet Supported on Floor or Stool Able to sit safely and securely 2 minutes   Stand to Sit Controls descent by using hands  Transfers Able to transfer safely, definite need of hands   Standing Unsupported with Eyes Closed Needs help to keep from falling   Standing Ubsupported with Feet Together Needs help to attain position but able to stand for 30 seconds with feet together   From Standing, Reach Forward with Outstretched Arm Reaches forward but needs supervision   From Standing Position, Pick up Object from Floor Unable to pick up and needs supervision   From Standing Position, Turn to Look Behind Over each Shoulder Needs assist to keep from losing balance and falling   Turn 360 Degrees Needs assistance while turning   Standing Unsupported, Alternately Place Feet on Step/Stool Needs assistance to keep from falling or unable to try   Standing Unsupported, One Foot in [employer] balance while stepping or standing   Standing on One Leg Unable to try or needs assist to prevent fall   Total Score 17   Timed Up and Go Test   Normal TUG (seconds) 19.78   Functional Gait  Assessment   Gait assessed  Yes   Gait Level Surface Walks 20 ft, slow speed, abnormal gait pattern, evidence for imbalance or deviates 10-15 in outside of the 12 in walkway width. Requires more than 7 sec to ambulate 20 ft.   Change in Gait  Speed Makes only minor adjustments to walking speed, or accomplishes a change in speed with significant gait deviations, deviates 10-15 in outside the 12 in walkway width, or changes speed but loses balance but is able to recover and continue walking.   Gait with Horizontal Head Turns Performs head turns with moderate changes in gait velocity, slows down, deviates 10-15 in outside 12 in walkway width but recovers, can continue to walk.   Gait with Vertical Head Turns Performs task with moderate change in gait velocity, slows down, deviates 10-15 in outside 12 in walkway width but recovers, can continue to walk.   Gait and Pivot Turn Turns slowly, requires verbal cueing, or requires several small steps to catch balance following turn and stop   Step Over Obstacle Cannot perform without assistance.   Gait with Narrow Base of Support Ambulates less than 4 steps heel to toe or cannot perform without assistance.   Gait with Eyes Closed Walks 20 ft, slow speed, abnormal gait pattern, evidence for imbalance, deviates 10-15 in outside 12 in walkway width. Requires more than 9 sec to ambulate 20 ft.   Ambulating Backwards Walks 20 ft, slow speed, abnormal gait pattern, evidence for imbalance, deviates 10-15 in outside 12 in walkway width.   Steps Two feet to a stair, must use rail.   Total Score 8                             PT Short Term Goals - 07/23/15 1530    PT SHORT TERM GOAL #1   Title Patient demonstrates initial HEP correctly with husband cueing.  (Target Date: 08/16/2015)   Time 4   Period Weeks   Status New   PT SHORT TERM GOAL #2   Title Timed Up-GO <17sec without device with supervision.   (Target Date: 08/16/2015)   Time 4   Period Weeks   Status New   PT SHORT TERM GOAL #3   Title Berg Balance >25/56   (Target Date: 08/16/2015)   Time 4   Period Weeks   Status New   PT SHORT TERM GOAL #4   Title Ambulates  500' without device with supervision.   (Target Date:  08/16/2015)   PT SHORT TERM GOAL #5   Title Patient is able to turn head to scan environment while ambulating and maintain her path with supervision.   (Target Date: 08/16/2015)   Time 4   Period Weeks   Status New           PT Long Term Goals - 08-22-15 1530    PT LONG TERM GOAL #1   Title Patient is independent with ongoing HEP / fitness plan. (Target Date: 09/20/2015)   Time 8   Period Weeks   Status New   PT LONG TERM GOAL #2   Title Berg Balance >45/56 to indicate lower fall risk.  (Target Date: 09/20/2015)   Time 8   Period Weeks   Status New   PT LONG TERM GOAL #3   Title Timed Up & Go <13.5 sec safely  (Target Date: 09/20/2015)   Time 8   Period Weeks   Status New   PT LONG TERM GOAL #4   Title Functional Gait Assessment > 15/30 to indicate lower fall risk.  (Target Date: 09/20/2015)   Time 8   Period Weeks   Status New   PT LONG TERM GOAL #5   Title ambulates >1000' without device including grass, ramps, curbs, stairs (1 rail) without device modified independent.  (Target Date: 09/20/2015)   Time 8   Period Weeks   Status New               Plan - 08/22/15 1530    Clinical Impression Statement This 65yo female was diagnosed with Glioblastoma in Nov 2015 with surgery Jan 2016. She made excellent progress with PT at [facility] from 10/10/14 to 02/05/15 for 22 visits. Radiology revealed changes in Nov 2015 and she began another bout of Chemotherapy Dec. 2016.  Her mobility has regressed and she needs skiled care to increase mobility with decreased fall risk.  She has left side hemiparesis.Berg Balance 17/56 (Berg at prior d/c was 52/56) indicates high fall risk. Timed Up-Go 19.78sec (prior TUG was 11.33sec with cognitive TUG 11.63sec) indicates fall risk. Functional Gait Assessment of 8/30 (prior was 26/30) also indicates fall risk. Gait is dependent now with velocity of 1.41ft/sec (prior was independent up to 1200' at velocity of 3.65 ft/sec) also indicates fall risk.     Pt will benefit from skilled therapeutic intervention in order to improve on the following deficits Abnormal gait;Decreased activity tolerance;Decreased balance;Decreased cognition;Decreased coordination;Decreased endurance;Decreased mobility;Decreased strength   Rehab Potential Good   PT Frequency 2x / week   PT Duration 8 weeks   PT Treatment/Interventions ADLs/[place] [facility];Gait training;Stair training;Functional mobility training;Therapeutic activities;Therapeutic exercise;Balance training;Neuromuscular re-education;Patient/family education;Vestibular   PT Next Visit Plan Set up HEP for strength & balance   Consulted and Agree with Plan of Care Patient;Family member/caregiver   Family Member Consulted husband          G-Codes - 08-22-15 1530    Functional Assessment Tool Used Timed Up-Go 19.78sec   Functional Limitation Mobility: Walking and moving around   Mobility: Walking and Moving Around Current Status [phone]) At least 60 percent but less than 80 percent impaired, limited or restricted   Mobility: Walking and Moving Around Goal Status [phone]) At least 20 percent but less than 40 percent impaired, limited or restricted       Problem List Patient Active Problem List   Diagnosis Date Noted  . Abnormality of gait 07/24/2015  .  Right hemiparesis ([place]) 07/24/2015  . Thyroid disease 05/01/2015  . Monilial vaginitis 10/23/2014  . Simple partial seizure, consciousness not impaired ([place]) 09/11/2014  . Deep vein thrombosis ([place]) 08/12/2014  . Acute embolism and thrombosis of deep vein of lower extremity ([place]) 08/12/2014  . History of inferior vena caval filter placement 07/14/2014  . Status post insertion of inferior vena caval filter 07/14/2014  . Malignant glioma of brain ([place]) 06/28/2014  . Diabetes mellitus, type 2 ([place]) 06/27/2014  . 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 Tremor of both hands 04/05/2014  . Vertigo 04/05/2014  . BPPV (benign paroxysmal  positional vertigo) 03/14/2014  . Seizures ([place]) 12/16/2013  . Epigastric pain 11/01/2013  . Fever blister 10/16/2013  . Hypothyroidism 12/13/2012  . Other and unspecified hyperlipidemia 12/13/2012  . Anxiety state, unspecified 12/13/2012  . Edema 12/13/2012  . Anxiety state 12/13/2012  . Adult hypothyroidism 12/13/2012  . Urinary incontinence   . DEPRESSION 09/24/2007  . Essential hypertension 09/24/2007  . GERD 09/24/2007  . HIATAL HERNIA 09/24/2007  . 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 [id number]) 09/24/2007    Shernell Saldierna PT, DPT 07/24/2015, 9:15 PM  [facility] [address] [place], [place], [address] Phone: [phone]   Fax:  [phone]  Name: LENETTE TERLIZZI MRN: [id number] Date of Birth: [birth year]

## 2015-07-25 ENCOUNTER — Telehealth: Payer: Self-pay

## 2015-07-25 ENCOUNTER — Ambulatory Visit: Payer: Medicare Other | Admitting: Occupational Therapy

## 2015-07-25 ENCOUNTER — Encounter: Payer: Self-pay | Admitting: Occupational Therapy

## 2015-07-25 DIAGNOSIS — G8191 Hemiplegia, unspecified affecting right dominant side: Secondary | ICD-10-CM | POA: Diagnosis not present

## 2015-07-25 DIAGNOSIS — R300 Dysuria: Secondary | ICD-10-CM

## 2015-07-25 DIAGNOSIS — Z7409 Other reduced mobility: Secondary | ICD-10-CM

## 2015-07-25 DIAGNOSIS — R350 Frequency of micturition: Secondary | ICD-10-CM

## 2015-07-25 DIAGNOSIS — R531 Weakness: Secondary | ICD-10-CM

## 2015-07-25 DIAGNOSIS — R4189 Other symptoms and signs involving cognitive functions and awareness: Secondary | ICD-10-CM

## 2015-07-25 NOTE — Therapy (Signed)
[facility] [address] [place], [place], [address] Phone: [phone]   Fax:  [phone]  Occupational Therapy Evaluation  Patient Details  Name: Kelly Fletcher MRN: [id number] Date of Birth: 1951-03-02 Referring Provider: Yvonna Alanis, NFP  Encounter Date: 07/25/2015      OT End of Session - 07/25/15 1242    Visit Number 1   Number of Visits 8   Date for OT Re-Evaluation 08/22/15   Authorization Type medicare will need G code and PN every 10th visit   Authorization Time Period 60 days   OT Start Time 1020   OT Stop Time 1110   OT Time Calculation (min) 50 min   Activity Tolerance Patient tolerated treatment well      Past Medical History  Diagnosis Date  . Uterine prolapse   . Cystocele   . GERD (gastroesophageal reflux disease)   . Elevated cholesterol   . Rectocele   . Hypertension   . Arthritis   . Type II or unspecified type diabetes mellitus without mention of complication, uncontrolled     Type 2  . Urinary incontinence     Urgency  . Anxiety state, unspecified   . Palpitations   . Allergic rhinitis, cause unspecified   . Other and unspecified hyperlipidemia   . Unspecified hypothyroidism   . Edema   . Insomnia, unspecified   . Depressive disorder, not elsewhere classified   . Unspecified urinary incontinence   . Peptic ulcer, unspecified site, unspecified as acute or chronic, without mention of hemorrhage, perforation, or obstruction   . Disturbance of skin sensation   . Calculus of kidney   . Facial spasm 04/05/2014  . Abnormal brain MRI 04/13/2014  . 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  Right face   . Monilial vaginitis 10/23/2014    Past Surgical History  Procedure Laterality Date  . Tonsillectomy and adenoidectomy  1963  . Dilation and curettage of uterus      3-05  . Hysteroscopy      3-05  . Endometrial biopsy       Dr.Gottsegen   . Tongue biopsy      There were no vitals filed for this visit.  Visit Diagnosis:  Impaired mobility and ADLs - Plan: Ot plan of care cert/re-cert  Impaired functional mobility and activity tolerance - Plan: Ot plan of care cert/re-cert  Hemiplegia affecting right dominant side ([place]) - Plan: Ot plan of care cert/re-cert  Impaired cognition - Plan: Ot plan of care cert/re-cert  Generalized weakness - Plan: Ot plan of care cert/re-cert      Subjective Assessment - 07/25/15 1023    Subjective  I have gotten worse.   Patient is accompained by: Family member  husband   Pertinent History see epic   Patient Stated Goals My right leg is weak and making me fall and makes me really tired.     Currently in Pain? No/denies           [facility] OT Assessment - 07/25/15 0001    Assessment   Diagnosis Brain tumor   Referring Provider Yvonna Alanis, NFP   Onset Date 05/22/15  date of progression; tumor in 04/2014 with surgery on 06/2014   Prior Therapy PT, OT and ST both inpt and outpt   Precautions   Precautions Fall  husband min a at all times with gait and using belt   Restrictions  Weight Bearing Restrictions No   Balance Screen   Has the patient fallen in the past 6 months No  see above   Home  Environment   Family/patient expects to be discharged to: Private residence   Available Help at Discharge Available 24 hours/day   Type of [place] One level   [place] Handicapped height   Additional Comments Grab bar in shower, shower seat - only uses occassoinally.   Prior Function   Level of Independence Independent  prior to tumor   Vocation Retired   Leisure enjoys spending time with grand dtr   ADL   Eating/Feeding Modified independent   Grooming Modified independent  assist to floss teeth   Upper Body Bathing Supervision/safety   Lower Body Bathing Supervision/safety   Upper Body Dressing Minimal assistance  for bra   Lower Body  Dressing Modified independent  husband assist with TED hose   Toilet Tranfer Minimal assistance  varies from day to day-depends on chemo schedule   Toileting - Clothing Manipulation Minimal assistance   Where Assessed - Toileting Clothing Manipulationn Supervision/safety   Tub/Shower Transfer Minimal assistance   ADL comments grab bar in shower and seat is available prn   IADL   Shopping Completely unable to shop   Light Housekeeping Does not participate in any housekeeping tasks   Meal Prep Needs to have meals prepared and served   Medication Management Is not capable of dispensing or managing own medication   Financial Management Dependent   Mobility   Mobility Status Needs assist   Mobility Status Comments min a in community and within home on "bad days"   Written Expression   Dominant Hand Right   Vision - History   Baseline Vision Wears glasses all the time   Vision Assessment   Comment Pt denies any changes in vision   Activity Tolerance   Activity Tolerance Tolerates 10-20 min activity with muiltiple rests   Cognition   Overall Cognitive Status Impaired/Different from baseline   Attention Selective;Sustained   Sustained Attention Impaired   Sustained Attention Impairment Verbal basic;Functional basic   Selective Attention Impaired   Selective Attention Impairment Verbal basic;Functional basic   Memory Impaired  to be further assessed   Awareness Impaired   Awareness Impairment Intellectual impairment;Emergent impairment;Anticipatory impairment   Problem Solving Impaired   Executive Function --  impaired to be further assessed functionally   Sensation   Light Touch Impaired by gross assessment   Hot/Cold Impaired by gross assessment   Proprioception Impaired by gross assessment   Additional Comments Pt feels the sensory changes are about the same as before this last progression.  Husband reports pt continues to use the hand as often as possible but when not engaged in  a task appears to "ignore" the hand.    Coordination   Gross Motor Movements are Fluid and Coordinated No  no appreciable difference in RUE since d/c from prior OT   Fine Motor Movements are Fluid and Coordinated No   Other Due to apraxia and cogntive deficts pt does not do well with standardized assessments such as box and blocks and 9 hole peg. Pt and husband report pt is using RUE as she was at d/c from OT services in 2016.  They do not feel progression of brain tumors has negatively impacted RUE functional use.    Edema   Edema no edema in R hand   ROM / Strength   AROM / PROM /  Strength AROM;PROM;Strength   AROM   Overall AROM  Deficits   Overall AROM Comments Pt with 140* of shoulder flexion and 100* of abduction all other AROM WFL's    PROM   Overall PROM  Within functional limits for tasks performed   Strength   Overall Strength Deficits   Overall Strength Comments Shoulder flexion 3+/5, all other 4/5   Hand Function   Right Hand Gross Grasp Impaired   Left Hand Gross Grasp Functional                              OT Long Term Goals - 08-07-15 1225    OT LONG TERM GOAL #1   Title Pt and husband will be mod I with home program prn - 08/22/2015   Status New   OT LONG TERM GOAL #2   Title Pt will be supervision to unload dishwasher    Status New   OT LONG TERM GOAL #3   Title Pt will be superivsion for toilet transfers with AE prn   Status New   OT LONG TERM GOAL #4   Title Pt and husband will verbalize understanding of safety recommendations as pt progresses through treatment/disease   Status New   OT LONG TERM GOAL #5   Title Pt will be supevision for simple sandwich/snack prep   Status New   Long Term Additional Goals   Additional Long Term Goals Yes   OT LONG TERM GOAL #6   Title Pt will be alble to tolerate 15 minutes of activity at ambuatory level without rest breaks.    Status New               Plan - 2015-08-07 1229    Clinical  Impression Statement Pt is a 65 year old femaile diagnosed in 04/2014 wtih L frontal/pariatel lobe gliobalsoma with remova/craniotomy in 1/206 of tumor.  Pt PMH includes:  HTN, DM, HLD, thyroid disease, seizure disorder, R hemiplegia and anxiety. Pt wtih follow up MRI on 05/22/2015 which showed progrsssion of diiease with mulitple lesions in L forntal and parieta lobe. Pt presnets today with the following impairments that have worsend with this last progression:  impaired memory, attention, insight, safetly and executive function, right dominant hemilplegia (leg worse than arm in terms of change), impaired static and dynamic standing balance, impaired functional mobility, impaired activity tolerance.  Deficits impact pt's ability to be independent with basic ADL's, IADls, leissure and roels as wife, friend and grandmother.  Pt will benefit from skilled OT to address these deficits to maximize engagement in life roles and increased indepenence in these areas.    Pt will benefit from skilled therapeutic intervention in order to improve on the following deficits (Retired) Decreased activity tolerance;Decreased balance;Decreased cognition;Decreased coordination;Decreased safety awareness;Decreased mobility;Decreased knowledge of use of DME;Decreased strength;Impaired UE functional use;Impaired sensation   Rehab Potential Fair   Clinical Impairments Affecting Rehab Potential cognitive impairment, current response to chemo   OT Frequency 2x / week   OT Duration 4 weeks   OT Treatment/Interventions [place]-care/ADL training;Therapeutic exercise;Neuromuscular education;DME and/or AE instruction;Therapist, nutritional;Therapeutic activities;Cognitive remediation/compensation;Patient/family education;Balance training   Plan NMR for balance, functional moblity and activity tolerance.   Consulted and Agree with Plan of Care Patient;Family member/caregiver   Family Member Consulted husband, PAT          G-Codes  - 07-Aug-2015 1246    Functional Limitation [place] care   [place] Care Current Status (  [id number]) At least 78 percent but less than 80 percent impaired, limited or restricted   [place] Care Goal Status [id number]) At least 40 percent but less than 60 percent impaired, limited or restricted      Problem List Patient Active Problem List   Diagnosis Date Noted  . Abnormality of gait 07/24/2015  . Right hemiparesis ([place]) 07/24/2015  . Thyroid disease 05/01/2015  . Monilial vaginitis 10/23/2014  . Simple partial seizure, consciousness not impaired ([place]) 09/11/2014  . Deep vein thrombosis ([place]) 08/12/2014  . Acute embolism and thrombosis of deep vein of lower extremity ([place]) 08/12/2014  . History of inferior vena caval filter placement 07/14/2014  . Status post insertion of inferior vena caval filter 07/14/2014  . Malignant glioma of brain ([place]) 06/28/2014  . Diabetes mellitus, type 2 ([place]) 06/27/2014  . 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 Tremor of both hands 04/05/2014  . Vertigo 04/05/2014  . BPPV (benign paroxysmal positional vertigo) 03/14/2014  . Seizures ([place]) 12/16/2013  . Epigastric pain 11/01/2013  . Fever blister 10/16/2013  . Hypothyroidism 12/13/2012  . Other and unspecified hyperlipidemia 12/13/2012  . Anxiety state, unspecified 12/13/2012  . Edema 12/13/2012  . Anxiety state 12/13/2012  . Adult hypothyroidism 12/13/2012  . Urinary incontinence   . DEPRESSION 09/24/2007  . Essential hypertension 09/24/2007  . GERD 09/24/2007  . HIATAL HERNIA 09/24/2007  . 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 Headache(784.0) 09/24/2007    Quay Burow, OTR/L 07/25/2015, 12:49 PM  [facility] [address] [place], [place], [address] Phone: [phone]   Fax:  [phone]  Name: Kelly Fletcher MRN: [id number] Date of Birth: 03-10-1951

## 2015-07-25 NOTE — Telephone Encounter (Signed)
Message left on triage voicemail: Patient was seen yesterday and this am patient developed burning when urinating and frequency (4 x in 40 min). Patient is requesting a ABX to start on until final results come back. [facility]   Schedule lab appointment for tomorrow for U/A and culture. Please advise if ABX to be given prior to final results of urine culture

## 2015-07-26 ENCOUNTER — Other Ambulatory Visit: Payer: Medicare Other

## 2015-07-26 DIAGNOSIS — R350 Frequency of micturition: Secondary | ICD-10-CM

## 2015-07-26 DIAGNOSIS — R300 Dysuria: Secondary | ICD-10-CM

## 2015-07-26 MED ORDER — CIPROFLOXACIN HCL 500 MG PO TABS: 500.0000 mg | ORAL_TABLET | Freq: Two times a day (BID) | ORAL | Status: DC

## 2015-07-26 NOTE — Telephone Encounter (Signed)
RX sent, patient's husband aware

## 2015-07-26 NOTE — Telephone Encounter (Signed)
Prescribe ciprofloxacin 500 mg (dispense 14 tablets) one twice daily for infection.

## 2015-07-27 LAB — URINALYSIS, ROUTINE W REFLEX MICROSCOPIC
BILIRUBIN UA: NEGATIVE
Glucose, UA: NEGATIVE
KETONES UA: NEGATIVE
Nitrite, UA: POSITIVE — AB
SPEC GRAV UA: 1.023 (ref 1.005–1.030)
Urobilinogen, Ur: 0.2 mg/dL (ref 0.2–1.0)
pH, UA: 5.5 (ref 5.0–7.5)

## 2015-07-27 LAB — MICROSCOPIC EXAMINATION
Casts: NONE SEEN /lpf
RBC, UA: >30 /hpf — AB (ref 0–?)
WBC, UA: >30 /hpf — AB (ref 0–?)

## 2015-07-28 LAB — URINE CULTURE

## 2015-08-02 ENCOUNTER — Emergency Department (HOSPITAL_COMMUNITY)
Admission: EM | Admit: 2015-08-02 | Discharge: 2015-08-03 | Disposition: A | Discharge status: Home / Self Care | Payer: Medicare Other | Attending: Emergency Medicine | Admitting: Emergency Medicine

## 2015-08-02 ENCOUNTER — Encounter (HOSPITAL_COMMUNITY): Payer: Self-pay | Admitting: Emergency Medicine

## 2015-08-02 ENCOUNTER — Emergency Department (HOSPITAL_COMMUNITY): Payer: Medicare Other

## 2015-08-02 DIAGNOSIS — G4700 Insomnia, unspecified: Secondary | ICD-10-CM | POA: Insufficient documentation

## 2015-08-02 DIAGNOSIS — Z8711 Personal history of peptic ulcer disease: Secondary | ICD-10-CM | POA: Insufficient documentation

## 2015-08-02 DIAGNOSIS — R569 Unspecified convulsions: Secondary | ICD-10-CM | POA: Diagnosis not present

## 2015-08-02 DIAGNOSIS — Z79899 Other long term (current) drug therapy: Secondary | ICD-10-CM | POA: Diagnosis not present

## 2015-08-02 DIAGNOSIS — Z87442 Personal history of urinary calculi: Secondary | ICD-10-CM | POA: Insufficient documentation

## 2015-08-02 DIAGNOSIS — E119 Type 2 diabetes mellitus without complications: Secondary | ICD-10-CM | POA: Diagnosis not present

## 2015-08-02 DIAGNOSIS — Z7984 Long term (current) use of oral hypoglycemic drugs: Secondary | ICD-10-CM | POA: Insufficient documentation

## 2015-08-02 DIAGNOSIS — Z87448 Personal history of other diseases of urinary system: Secondary | ICD-10-CM | POA: Insufficient documentation

## 2015-08-02 DIAGNOSIS — M62838 Other muscle spasm: Secondary | ICD-10-CM | POA: Diagnosis not present

## 2015-08-02 DIAGNOSIS — K219 Gastro-esophageal reflux disease without esophagitis: Secondary | ICD-10-CM | POA: Diagnosis not present

## 2015-08-02 DIAGNOSIS — I10 Essential (primary) hypertension: Secondary | ICD-10-CM | POA: Diagnosis not present

## 2015-08-02 DIAGNOSIS — Z91040 Latex allergy status: Secondary | ICD-10-CM | POA: Diagnosis not present

## 2015-08-02 DIAGNOSIS — F419 Anxiety disorder, unspecified: Secondary | ICD-10-CM | POA: Diagnosis not present

## 2015-08-02 DIAGNOSIS — Z85841 Personal history of malignant neoplasm of brain: Secondary | ICD-10-CM | POA: Insufficient documentation

## 2015-08-02 DIAGNOSIS — R251 Tremor, unspecified: Secondary | ICD-10-CM | POA: Diagnosis present

## 2015-08-02 DIAGNOSIS — Z792 Long term (current) use of antibiotics: Secondary | ICD-10-CM | POA: Insufficient documentation

## 2015-08-02 DIAGNOSIS — M1990 Unspecified osteoarthritis, unspecified site: Secondary | ICD-10-CM | POA: Insufficient documentation

## 2015-08-02 DIAGNOSIS — F329 Major depressive disorder, single episode, unspecified: Secondary | ICD-10-CM | POA: Insufficient documentation

## 2015-08-02 DIAGNOSIS — E785 Hyperlipidemia, unspecified: Secondary | ICD-10-CM | POA: Insufficient documentation

## 2015-08-02 DIAGNOSIS — E039 Hypothyroidism, unspecified: Secondary | ICD-10-CM | POA: Diagnosis not present

## 2015-08-02 LAB — CBC WITH DIFFERENTIAL/PLATELET
BASOS PCT: 0 %
Basophils Absolute: 0 10*3/uL (ref 0.0–0.1)
Eosinophils Absolute: 0.1 10*3/uL (ref 0.0–0.7)
Eosinophils Relative: 3 %
HCT: 33.2 % — ABNORMAL LOW (ref 36.0–46.0)
HEMOGLOBIN: 11.2 g/dL — AB (ref 12.0–15.0)
LYMPHS ABS: 1 10*3/uL (ref 0.7–4.0)
LYMPHS PCT: 28 %
MCH: 34.8 pg — AB (ref 26.0–34.0)
MCHC: 33.7 g/dL (ref 30.0–36.0)
MCV: 103.1 fL — AB (ref 78.0–100.0)
MONO ABS: 0.2 10*3/uL (ref 0.1–1.0)
MONOS PCT: 6 %
NEUTROS ABS: 2.3 10*3/uL (ref 1.7–7.7)
Neutrophils Relative %: 63 %
PLATELETS: 128 10*3/uL — AB (ref 150–400)
RBC: 3.22 MIL/uL — ABNORMAL LOW (ref 3.87–5.11)
RDW: 13.2 % (ref 11.5–15.5)
WBC: 3.7 10*3/uL — ABNORMAL LOW (ref 4.0–10.5)

## 2015-08-02 LAB — BASIC METABOLIC PANEL
Anion gap: 9 (ref 5–15)
BUN: 15 mg/dL (ref 6–20)
CO2: 27 mmol/L (ref 22–32)
Calcium: 9.2 mg/dL (ref 8.9–10.3)
Chloride: 104 mmol/L (ref 101–111)
Creatinine, Ser: 0.57 mg/dL (ref 0.44–1.00)
GFR calc Af Amer: >60 mL/min (ref >60)
GLUCOSE: 101 mg/dL — AB (ref 65–99)
POTASSIUM: 3.8 mmol/L (ref 3.5–5.1)
Sodium: 140 mmol/L (ref 135–145)

## 2015-08-02 MED ORDER — LACOSAMIDE 50 MG PO TABS
200.0000 mg | ORAL_TABLET | Freq: Once | ORAL | Status: AC
Start: 2015-08-02 — End: 2015-08-02
  Administered 2015-08-02: 200 mg via ORAL
  Filled 2015-08-02: qty 4

## 2015-08-02 MED ORDER — LORAZEPAM 2 MG/ML IJ SOLN
1.0000 mg | Freq: Once | INTRAMUSCULAR | Status: AC
  Administered 2015-08-02: 0.5 mg via INTRAVENOUS
  Filled 2015-08-02: qty 1

## 2015-08-02 MED ORDER — DEXAMETHASONE SODIUM PHOSPHATE 10 MG/ML IJ SOLN
10.0000 mg | Freq: Once | INTRAMUSCULAR | Status: AC
  Administered 2015-08-02: 10 mg via INTRAVENOUS
  Filled 2015-08-02: qty 1

## 2015-08-02 MED ORDER — CLOBAZAM 10 MG PO TABS
10.0000 mg | ORAL_TABLET | Freq: Once | ORAL | Status: AC
  Administered 2015-08-02: 10 mg via ORAL
  Filled 2015-08-02: qty 1

## 2015-08-02 MED ORDER — LEVETIRACETAM 750 MG PO TABS
1500.0000 mg | ORAL_TABLET | Freq: Once | ORAL | Status: AC
  Administered 2015-08-02: 1500 mg via ORAL
  Filled 2015-08-02: qty 2

## 2015-08-02 NOTE — ED Notes (Signed)
Bed: [facility] Expected date:  Expected time:  Means of arrival:  Comments: ems 

## 2015-08-02 NOTE — ED Notes (Signed)
Brought in by EMS from home with c/o upper extremity tremors.  Per EMS, family reported that pt has chronic "tremors/seizures" on her upper extremities---- takes Klonopin for this.  Tonight, tremors "lasted for 5 minutes" which not her normal.  Pt has Hx of Frontal Brain Tumor with speech impediment.

## 2015-08-02 NOTE — ED Provider Notes (Signed)
CSN: [id number]     Arrival date & time 08/02/15  2058 History   First MD Initiated Contact with Patient 08/02/15 2109     Chief Complaint  Patient presents with  . Extremity Tremors     HPI   Ms. Jani is an 65 y.o. female with history of glioblastoma (currently undergoing chemo at [facility]), seizures (on keppra, vimpat, and omfi), DM, HTN, HLD who presents to the ED for evaluation of a seizure. Per pt's husband, pt started having jerking in her right arm around 5 PM this evening. This is where she typically gets seizures and he gave her one dose of klonopin which usually resolves the episode. The seizure apparently continued so EMS called who gave her another dose of klonopin en route to the ED. In the ED now pt is still having a seizure localized to her right UE and some right neck spasm. She is still able to converse. She is A&O x 3. She and her husband deny recent falls or head injury. Last MRI was a couple weeks ago and showed stablization if not improvement in her tumor burden.   Past Medical History  Diagnosis Date  . Uterine prolapse   . Cystocele   . GERD (gastroesophageal reflux disease)   . Elevated cholesterol   . Rectocele   . Hypertension   . Arthritis   . Type II or unspecified type diabetes mellitus without mention of complication, uncontrolled     Type 2  . Urinary incontinence     Urgency  . Anxiety state, unspecified   . Palpitations   . Allergic rhinitis, cause unspecified   . Other and unspecified hyperlipidemia   . Unspecified hypothyroidism   . Edema   . Insomnia, unspecified   . Depressive disorder, not elsewhere classified   . Unspecified urinary incontinence   . Peptic ulcer, unspecified site, unspecified as acute or chronic, without mention of hemorrhage, perforation, or obstruction   . Disturbance of skin sensation   . Calculus of kidney   . Facial spasm 04/05/2014  . Abnormal brain MRI 04/13/2014  . 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  Right face   . Monilial vaginitis 10/23/2014   Past Surgical History  Procedure Laterality Date  . Tonsillectomy and adenoidectomy  1963  . Dilation and curettage of uterus      3-05  . Hysteroscopy      3-05  . Endometrial biopsy      Dr.Gottsegen   . Tongue biopsy     Family History  Problem Relation Age of Onset  . Hypertension Mother   . Breast cancer Mother 70  . Heart disease Father   . Stroke Father   . Hypertension Sister   . Heart disease Sister   . Osteoporosis Sister   . Diabetes Brother   . Hypertension Brother   . Heart disease Brother   . Other Brother     BRAIN TUMOR  . Cancer Brother     Eye cancer-Melanoma  . Diabetes Brother   . Heart disease Brother   . Heart attack Sister   . Heart disease Brother   . Heart attack Brother    Social History  Substance Use Topics  . Smoking status: Never Smoker   . Smokeless tobacco: Never Used  . Alcohol Use: No   OB History    Gravida Para Term Preterm AB TAB SAB Ectopic Multiple Living  1 1 1       1      Review of Systems  All other systems reviewed and are negative.     Allergies  Shellfish allergy; Ibuprofen; Latex; Naproxen; Nitrofurantoin monohyd macro; Paroxetine hcl; Prednisone; Prozac; and Tegretol  Home Medications   Prior to Admission medications   Medication Sig Start Date End Date Taking? Authorizing Provider  atorvastatin (LIPITOR) 10 MG tablet ONE DAILY TO CONTROL CHOLESTEROL 06/21/15   Estill Dooms, MD  b complex vitamins tablet Take 1 tablet by mouth daily.    Historical Provider, MD  cholecalciferol (VITAMIN D) 1000 UNITS tablet Take 1,000 Units by mouth daily. Takes 2000-5000mg  daily    Historical Provider, MD  ciprofloxacin (CIPRO) 500 MG tablet Take 1 tablet (500 mg total) by mouth 2 (two) times daily. 07/26/15   Estill Dooms, MD  cloBAZam (ONFI) 10 MG tablet Take 10 mg by mouth at bedtime. 09/11/14   Historical Provider, MD   clonazePAM (KLONOPIN) 1 MG tablet Take 1 mg by mouth as needed. Take at onset of seizure    Historical Provider, MD  diclofenac sodium (VOLTAREN) 1 % GEL Apply topically. 07/05/15 07/04/16  Historical Provider, MD  docusate sodium (COLACE) 100 MG capsule Take 100 mg by mouth 2 (two) times daily.    Historical Provider, MD  escitalopram (LEXAPRO) 10 MG tablet Take 10 mg by mouth daily. Pt taking 1/2 tablet daily at night    Historical Provider, MD  lacosamide (VIMPAT) 200 MG TABS tablet Take 200 mg by mouth 2 (two) times daily.    Historical Provider, MD  Lancets MISC Use as directed. E11.9 04/17/15   Estill Dooms, MD  levETIRAcetam (KEPPRA) 750 MG tablet TAKE 2 TABLETS BY MOUTH TWICE A DAY 06/05/15   Marcial Pacas, MD  levothyroxine (SYNTHROID, LEVOTHROID) 125 MCG tablet TAKE 1 TABLET (125 MCG TOTAL) BY MOUTH DAILY BEFORE BREAKFAST. 02/14/15   Estill Dooms, MD  lisinopril (PRINIVIL,ZESTRIL) 10 MG tablet Take by mouth. 07/17/15 07/16/16  Historical Provider, MD  loratadine (CLARITIN) 10 MG tablet Take 10 mg by mouth daily as needed for allergies.    Historical Provider, MD  metFORMIN (GLUCOPHAGE-XR) 500 MG 24 hr tablet TAKE 1 TABLET BY MOUTH TWICE A DAY TO CONTROL BLOOD SUGAR 03/04/15   Estill Dooms, MD  ondansetron (ZOFRAN) 8 MG tablet Take 8 mg by mouth every 8 (eight) hours as needed. 08/15/14   Historical Provider, MD  ONE TOUCH ULTRA TEST test strip CHECK BLOOD GLUCOSE AS DIRECTED 09/24/14   Tiffany L Reed, DO  pantoprazole (PROTONIX) 40 MG tablet One daily to reduce stomach acid 12/25/14   Estill Dooms, MD  pseudoephedrine (SUDAFED) 30 MG tablet Take 30 mg by mouth every 4 (four) hours as needed for congestion.    Historical Provider, MD  sennosides-docusate sodium (SENOKOT-S) 8.6-50 MG tablet Take 1 tablet by mouth 2 (two) times daily.     Historical Provider, MD  temozolomide (TEMODAR) 100 MG capsule Take 1 capsule (100 mg total) by mouth at bedtime. May take on an empty stomach or at bedtime to  decrease nausea & vomiting. 05/01/15   Ladell Pier, MD  valACYclovir (VALTREX) 1000 MG tablet Take 1,000 mg by mouth 2 (two) times daily as needed. To help resolve viral blisters    Historical Provider, MD   BP 137/72 mmHg  Pulse 60  Temp(Src) 97.5 F (36.4 C) (Oral)  Resp 16  SpO2 96% Physical Exam  Constitutional: She is  oriented to person, place, and time. No distress.  Thin, chronically ill appearing  HENT:  Right Ear: External ear normal.  Left Ear: External ear normal.  Nose: Nose normal.  Mouth/Throat: Oropharynx is clear and moist. No oropharyngeal exudate.  Eyes: Conjunctivae and EOM are normal. Pupils are equal, round, and reactive to light.  Neck: Normal range of motion. Neck supple.  Cardiovascular: Normal rate, regular rhythm, normal heart sounds and intact distal pulses.   Pulmonary/Chest: Effort normal. No respiratory distress. She exhibits no tenderness.  Abdominal: Soft. Bowel sounds are normal. She exhibits no distension. There is no tenderness.  Musculoskeletal: She exhibits no edema.  Neurological: She is alert and oriented to person, place, and time. No cranial nerve deficit.  Right UE with small jerking motions. At times with right neck spasm. Right corner of mouth drawn tight.   Skin: Skin is warm and dry. She is not diaphoretic.  Psychiatric: She has a normal mood and affect.  Nursing note and vitals reviewed.   ED Course  Procedures (including critical care time) Labs Review Labs Reviewed  CBC WITH DIFFERENTIAL/PLATELET - Abnormal; Notable for the following:    WBC 3.7 (*)    RBC 3.22 (*)    Hemoglobin 11.2 (*)    HCT 33.2 (*)    MCV 103.1 (*)    MCH 34.8 (*)    Platelets 128 (*)    All other components within normal limits  BASIC METABOLIC PANEL    Imaging Review Ct Head Wo Contrast  08/02/2015  CLINICAL DATA:  Acute onset right of upper extremity tremors. Initial encounter. EXAM: CT HEAD WITHOUT CONTRAST TECHNIQUE: Contiguous axial images  were obtained from the base of the skull through the vertex without intravenous contrast. COMPARISON:  PET/CT performed 05/23/2014, and MRI of the brain performed 04/07/2014 FINDINGS: The patient is status post resection of the previously noted left posterior frontal lobe mass. Underlying decreased attenuation is of uncertain significance. No defined mass is seen at this time, though evaluation for recurrent mass is limited on CT. An overlying craniotomy flap is noted. Prominence of the ventricles suggests mild cortical volume loss. There is no evidence of acute infarct. No intracranial hemorrhage is seen. The basal ganglia are grossly unremarkable in appearance. The cerebellum, brainstem and fourth ventricle are within normal limits. No significant mass effect or midline shift is seen at this time. The visualized osseous structures are grossly unremarkable. There is no evidence of fracture. The orbits are unremarkable in appearance. The visualized paranasal sinuses and mastoid air cells are well-aerated. The orbits are unremarkable in appearance. No significant soft tissue abnormalities are seen. IMPRESSION: 1. Status post resection of previously noted left posterior frontal lobe mass. Underlying vaguely decreased attenuation is of uncertain significance. No defined mass is seen at this time, though evaluation for recurrent mass is limited on CT. If there is significant clinical concern for recurrent mass, MRI could be considered for further evaluation. 2. Underlying mild cortical volume loss noted. Electronically Signed   By: Garald Balding M.D.   On: 08/02/2015 23:42   I have personally reviewed and evaluated these images and lab results as part of my medical decision-making.   EKG Interpretation None      MDM   Final diagnoses:  Seizure ([place])    Pt is an 65 y.o. female with glioblastoma undergoing chemo and followed by neuro/oncology at [facility]. She is currently having a seizure in the ED  uncontrolled by two doses of klonopin. Will give 1mg   ativan and load with home meds including keppra, omfi, and vimpat. Will obtain basic labs and CT head to r/o new bleed or other acute abnormality. Will page [facility] on-call for neurology to touch base.  I spoke to Dr. Veryl Speak of [facility] who is pt's primary provider there. She agrees with above plan. If we break the seizure and pt remains seizure-free in the ED with negative head CT can d/c home with outpatient f/u. Can also give IV decadron with home taper of prednisone. No need for transfer tonight. Appreciate recs.   CT shows no acute hemorrhage or infarct. Pt no longer with seizure/tremor activity in room. She does continue to note some decreased sensation in her right arm/hand. Her strength is weaker than left though this is baseline. Will monitor and reassess.  Pt continues to rest comfortably with no further seizure activity. Pt still with slightly decreased sensation in right hand compared to left, though improved. Pt's husband reports this has happened once before after a similar seizure. At this time no indication for further evaluation or management in the ED. Instructed to continue home meds as prescribed. Per Dr. Caesar Bookman recs, will give prednisone taper. ER return precautions given.   Anne Ng, PA-C 08/03/15 [place], DO 08/03/15 [phone]

## 2015-08-03 MED ORDER — PREDNISONE 10 MG (21) PO TBPK: 10.0000 mg | ORAL_TABLET | Freq: Every day | ORAL | Status: DC

## 2015-08-03 NOTE — Discharge Instructions (Signed)
Kelly Fletcher was seen in the ER today for evaluation and treatment of a seizure. We were able to control her seizure in the ER. Her CT scan showed no evidence of new bleed or infarct. Her bloodwork was normal. Please call Dr. Veryl Speak to schedule a follow up appointment as soon as possible. In the meantime I will give her a prescription for a prednisone taper. Continue taking her home medications as prescribed. Return to the ER for new or worsening symptoms.

## 2015-08-05 ENCOUNTER — Telehealth: Payer: Self-pay | Admitting: *Deleted

## 2015-08-05 ENCOUNTER — Ambulatory Visit: Payer: Medicare Other | Admitting: Speech Pathology

## 2015-08-05 ENCOUNTER — Ambulatory Visit: Payer: Medicare Other | Admitting: Occupational Therapy

## 2015-08-05 NOTE — Telephone Encounter (Signed)
"  Friday around 7:00 pm, Kelly Fletcher had a seizure.  Went to [facility] ED and discharged Saturday morning.  Some residual effects to her ability to walk, use of right arm.  We were advised to do a follow up is why I'm calling to connect with you.

## 2015-08-05 NOTE — Telephone Encounter (Signed)
Returned call to pt's husband. He reports the weakness and difficulty with ambulation seems to be improving. Informed him of Dr. Gearldine Shown recommendation to contact her neurologist. He agreed to do so. They have been in contact with Dr. Memory Argue since the seizure and will continue to follow up there.

## 2015-08-06 ENCOUNTER — Other Ambulatory Visit: Payer: Self-pay | Admitting: Internal Medicine

## 2015-08-08 ENCOUNTER — Ambulatory Visit: Payer: Medicare Other

## 2015-08-08 ENCOUNTER — Encounter: Payer: Self-pay | Admitting: Physical Therapy

## 2015-08-08 ENCOUNTER — Ambulatory Visit: Payer: Medicare Other | Attending: Nurse Practitioner | Admitting: Physical Therapy

## 2015-08-08 DIAGNOSIS — R4701 Aphasia: Secondary | ICD-10-CM | POA: Insufficient documentation

## 2015-08-08 DIAGNOSIS — R269 Unspecified abnormalities of gait and mobility: Secondary | ICD-10-CM | POA: Diagnosis present

## 2015-08-08 DIAGNOSIS — R2689 Other abnormalities of gait and mobility: Secondary | ICD-10-CM

## 2015-08-08 DIAGNOSIS — R482 Apraxia: Secondary | ICD-10-CM | POA: Insufficient documentation

## 2015-08-08 DIAGNOSIS — R29818 Other symptoms and signs involving the nervous system: Secondary | ICD-10-CM | POA: Diagnosis present

## 2015-08-08 DIAGNOSIS — Z7409 Other reduced mobility: Secondary | ICD-10-CM | POA: Insufficient documentation

## 2015-08-08 DIAGNOSIS — R2681 Unsteadiness on feet: Secondary | ICD-10-CM | POA: Diagnosis present

## 2015-08-08 DIAGNOSIS — R4189 Other symptoms and signs involving cognitive functions and awareness: Secondary | ICD-10-CM | POA: Diagnosis present

## 2015-08-08 DIAGNOSIS — G8191 Hemiplegia, unspecified affecting right dominant side: Secondary | ICD-10-CM | POA: Insufficient documentation

## 2015-08-08 DIAGNOSIS — R531 Weakness: Secondary | ICD-10-CM

## 2015-08-08 NOTE — Therapy (Signed)
[facility] [address] [place], [place], [address] Phone: [phone]   Fax:  [phone]  Speech Language Pathology Treatment  Patient Details  Name: Kelly Fletcher MRN: [id number] Date of Birth: 1951/05/18 Referring Provider: Edwin Cap, FNP  Encounter Date: 08/08/2015      End of Session - 08/08/15 1141    Visit Number 2   Number of Visits 17   Date for SLP Re-Evaluation 09/20/15   SLP Start Time 1018   SLP Stop Time  1100   SLP Time Calculation (min) 42 min   Activity Tolerance Other (comment)  anxious at times      Past Medical History  Diagnosis Date  . Uterine prolapse   . Cystocele   . GERD (gastroesophageal reflux disease)   . Elevated cholesterol   . Rectocele   . Hypertension   . Arthritis   . Type II or unspecified type diabetes mellitus without mention of complication, uncontrolled     Type 2  . Urinary incontinence     Urgency  . Anxiety state, unspecified   . Palpitations   . Allergic rhinitis, cause unspecified   . Other and unspecified hyperlipidemia   . Unspecified hypothyroidism   . Edema   . Insomnia, unspecified   . Depressive disorder, not elsewhere classified   . Unspecified urinary incontinence   . Peptic ulcer, unspecified site, unspecified as acute or chronic, without mention of hemorrhage, perforation, or obstruction   . Disturbance of skin sensation   . Calculus of kidney   . Facial spasm 04/05/2014  . Abnormal brain MRI 04/13/2014  . 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  Right face   . Monilial vaginitis 10/23/2014    Past Surgical History  Procedure Laterality Date  . Tonsillectomy and adenoidectomy  1963  . Dilation and curettage of uterus      3-05  . Hysteroscopy      3-05  . Endometrial biopsy      Dr.Gottsegen   . Tongue biopsy      There were no vitals filed for this visit.  Visit  Diagnosis: Expressive aphasia  Verbal apraxia      Subjective Assessment - 08/08/15 1115    Subjective Pt had seizure Friday night. Husband states verbal expression unchanged, pt states a change in verbal expression.   Patient is accompained by: Family member  husband               ADULT SLP TREATMENT - 08/08/15 1117    General Information   Behavior/Cognition Alert;Cooperative  anxiety, frustration noted    Treatment Provided   Treatment provided Cognitive-Linquistic   Pain Assessment   Pain Assessment No/denies pain   Cognitive-Linquistic Treatment   Treatment focused on Aphasia;Apraxia   Skilled Treatment Pt with difficulty today with divergent naming however appeared more cognitive as pt able to name items when shown pictures. Pt unable to generate meaningful message with simple to mod complex language without listener A. SLP needed to provide cues for communication.   Assessment / Recommendations / Plan   Plan Continue with current plan of care;Goals updated   Progression Toward Goals   Progression toward goals Not progressing toward goals (comment)            SLP Short Term Goals - 08/08/15 1144    SLP SHORT TERM GOAL #1   Title pt will complete HEP for motor  speech planning with slow rate 90% success with rare verbal cues   Time 4   Period Weeks   Status On-going   SLP SHORT TERM GOAL #2   Title pt will produce sentences using slow rate 18/20 opportunities   Time 4   Period Weeks   Status On-going   SLP SHORT TERM GOAL #3   Title pt will demo simplified language in mod complex sentence responses 75% of the time   Time 4   Period Weeks   Status On-going   SLP SHORT TERM GOAL #4   Status On-going          SLP Long Term Goals - 08/08/15 1144    SLP LONG TERM GOAL #1   Title pt will complete HEP for motor speech planning with slow rate 95% success   Time 8   Period Weeks   Status On-going   SLP LONG TERM GOAL #2   Title pt will complete 8  minutes of mod complex conversation using simplified language and other compensations (slow rate, etc) PRN   Time 8   Period Weeks   Status On-going   SLP LONG TERM GOAL #3   Status On-going   SLP LONG TERM GOAL #4   Status On-going          Plan - 08/08/15 1141    Clinical Impression Statement Pt presents with slightly decr'd verbal expression skills than at eval last month. Again, these skills all decr further as linguistic complexity and anxiety, level of emotion increase. Pt states she can generate her message more efficiently when she does not look at her listener. She expresses frustration at inability to communicate. Skilled ST is cont'd recommended x2 week for 8 weeks with assessment after 4 weeks for efficacy of therapeutic intervention.    Speech Therapy Frequency 2x / week   Duration --  8 weeks   Treatment/Interventions SLP instruction and feedback;Compensatory strategies;Patient/family education;Cueing hierarchy   Potential to Achieve Goals Fair   Potential Considerations Severity of impairments;Co-morbidities;Previous level of function   Consulted and Agree with Plan of Care Patient        Problem List Patient Active Problem List   Diagnosis Date Noted  . Abnormality of gait 07/24/2015  . Right hemiparesis ([place]) 07/24/2015  . Thyroid disease 05/01/2015  . Monilial vaginitis 10/23/2014  . Simple partial seizure, consciousness not impaired ([place]) 09/11/2014  . Deep vein thrombosis ([place]) 08/12/2014  . Acute embolism and thrombosis of deep vein of lower extremity ([place]) 08/12/2014  . History of inferior vena caval filter placement 07/14/2014  . Status post insertion of inferior vena caval filter 07/14/2014  . Malignant glioma of brain ([place]) 06/28/2014  . Diabetes mellitus, type 2 ([place]) 06/27/2014  . 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 Tremor of both hands 04/05/2014  . Vertigo 04/05/2014  . BPPV (benign paroxysmal positional vertigo) 03/14/2014  .  Seizures ([place]) 12/16/2013  . Epigastric pain 11/01/2013  . Fever blister 10/16/2013  . Hypothyroidism 12/13/2012  . Other and unspecified hyperlipidemia 12/13/2012  . Anxiety state, unspecified 12/13/2012  . Edema 12/13/2012  . Anxiety state 12/13/2012  . Adult hypothyroidism 12/13/2012  . Urinary incontinence   . DEPRESSION 09/24/2007  . Essential hypertension 09/24/2007  . GERD 09/24/2007  . HIATAL HERNIA 09/24/2007  . 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 Headache(784.0) 09/24/2007    Chandler Stofer ,[place], [place]  08/08/2015, 11:49 AM  [facility] [address] [place], [place], [address] Phone:  [phone]   Fax:  [phone]   Name: Kelly Fletcher MRN: [id number] Date of Birth: 24-Sep-1950

## 2015-08-08 NOTE — Patient Instructions (Signed)
  Please complete the assigned speech therapy homework and return it to your next session.  

## 2015-08-08 NOTE — Therapy (Signed)
[facility] [address] [place], [place], [address] Phone: [phone]   Fax:  [phone]  Physical Therapy Treatment  Patient Details  Name: Kelly Fletcher MRN: [id number] Date of Birth: Feb 27, 1951 Referring Provider: Ladoris Gene, MD  Encounter Date: 08/08/2015      PT End of Session - 08/08/15 1028    Visit Number 2   Number of Visits 17   Date for PT Re-Evaluation 09/20/15   Authorization Type Medicare G-Code & Progress Note every 10th visit   PT Start Time 0930   PT Stop Time 1014   PT Time Calculation (min) 44 min   Equipment Utilized During Treatment Gait belt   Activity Tolerance Patient tolerated treatment well   Behavior During Therapy [facility] for tasks assessed/performed      Past Medical History  Diagnosis Date  . Uterine prolapse   . Cystocele   . GERD (gastroesophageal reflux disease)   . Elevated cholesterol   . Rectocele   . Hypertension   . Arthritis   . Type II or unspecified type diabetes mellitus without mention of complication, uncontrolled     Type 2  . Urinary incontinence     Urgency  . Anxiety state, unspecified   . Palpitations   . Allergic rhinitis, cause unspecified   . Other and unspecified hyperlipidemia   . Unspecified hypothyroidism   . Edema   . Insomnia, unspecified   . Depressive disorder, not elsewhere classified   . Unspecified urinary incontinence   . Peptic ulcer, unspecified site, unspecified as acute or chronic, without mention of hemorrhage, perforation, or obstruction   . Disturbance of skin sensation   . Calculus of kidney   . Facial spasm 04/05/2014  . Abnormal brain MRI 04/13/2014  . 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  Right face   . Monilial vaginitis 10/23/2014    Past Surgical History  Procedure Laterality Date  . Tonsillectomy and adenoidectomy  1963  . Dilation and curettage of uterus       3-05  . Hysteroscopy      3-05  . Endometrial biopsy      Dr.Gottsegen   . Tongue biopsy      There were no vitals filed for this visit.  Visit Diagnosis:  Generalized weakness  Balance problems  Unsteadiness      Subjective Assessment - 08/08/15 0938    Subjective Patient visited ED at [facility] on 08/02/15 with no admittance to hospital for suspected seizure. Doctor gave no instructions to discontinue with physical therapy. Consulted with supervising PT and okay to continue with current plan of care. Patient discussed previous HEP since last discharge. Patient expresses concern with fatigue secondary to chemo treatments. Patient reports unsteadiness but no new falls.   Patient is accompained by: Family member   Pertinent History Hypothyroidism, DM2, HTN, depression, DVT   Limitations Standing;Walking;House hold activities   Patient Stated Goals Walk and talk better.    Currently in Pain? No/denies     Treatment: Pt currently only performing exercises for UE at home and walking. Purpose of today's session was to establish HEP for strengthening and balance. ?Refer to pt instructions for full details. Tresa Moore)       PT Education - 08/08/15 1024    Education provided Yes   Education Details Issued HEP: Sit to stands; Corner exercises tandem, single leg, feet together head movement (eyes open and  closed).    Person(s) Educated Patient;Spouse   Methods Explanation;Demonstration;Verbal cues;Handout   Comprehension Verbalized understanding;Returned demonstration;Verbal cues required          PT Short Term Goals - 07/23/15 1530    PT SHORT TERM GOAL #1   Title Patient demonstrates initial HEP correctly with husband cueing.  (Target Date: 08/16/2015)   Time 4   Period Weeks   Status New   PT SHORT TERM GOAL #2   Title Timed Up-GO <17sec without device with supervision.   (Target Date: 08/16/2015)   Time 4   Period Weeks   Status New   PT SHORT TERM GOAL #3   Title Berg  Balance >25/56   (Target Date: 08/16/2015)   Time 4   Period Weeks   Status New   PT SHORT TERM GOAL #4   Title Ambulates 500' without device with supervision.   (Target Date: 08/16/2015)   PT SHORT TERM GOAL #5   Title Patient is able to turn head to scan environment while ambulating and maintain her path with supervision.   (Target Date: 08/16/2015)   Time 4   Period Weeks   Status New          PT Long Term Goals - 07/23/15 1530    PT LONG TERM GOAL #1   Title Patient is independent with ongoing HEP / fitness plan. (Target Date: 09/20/2015)   Time 8   Period Weeks   Status New   PT LONG TERM GOAL #2   Title Berg Balance >45/56 to indicate lower fall risk.  (Target Date: 09/20/2015)   Time 8   Period Weeks   Status New   PT LONG TERM GOAL #3   Title Timed Up & Go <13.5 sec safely  (Target Date: 09/20/2015)   Time 8   Period Weeks   Status New   PT LONG TERM GOAL #4   Title Functional Gait Assessment > 15/30 to indicate lower fall risk.  (Target Date: 09/20/2015)   Time 8   Period Weeks   Status New   PT LONG TERM GOAL #5   Title ambulates >1000' without device including grass, ramps, curbs, stairs (1 rail) without device modified independent.  (Target Date: 09/20/2015)   Time 8   Period Weeks   Status New          Plan - 08/08/15 1029    Clinical Impression Statement Skilled session to establish HEP to address deficits in hip strength and static balance. Patient displays enthusiasm for new HEP and is making progress toward goals.   Pt will benefit from skilled therapeutic intervention in order to improve on the following deficits Abnormal gait;Decreased activity tolerance;Decreased balance;Decreased cognition;Decreased coordination;Decreased endurance;Decreased mobility;Decreased strength   Rehab Potential Good   PT Frequency 2x / week   PT Duration 8 weeks   PT Treatment/Interventions ADLs/[facility];Gait training;Stair training;Functional mobility  training;Therapeutic activities;Therapeutic exercise;Balance training;Neuromuscular re-education;Patient/family education;Vestibular   PT Next Visit Plan Review HEP established this visit. Continue toward's STGs.   PT Home Exercise Plan Added Sit to Stand, Corner exercises with feet together head movements (eyes open and closed), tandem stance, single leg stance.   Consulted and Agree with Plan of Care Patient;Family member/caregiver   Family Member Consulted husband      Problem List Patient Active Problem List   Diagnosis Date Noted  . Abnormality of gait 07/24/2015  . Right hemiparesis ([place]) 07/24/2015  . Thyroid disease 05/01/2015  . Monilial vaginitis 10/23/2014  .  Simple partial seizure, consciousness not impaired ([place]) 09/11/2014  . Deep vein thrombosis ([place]) 08/12/2014  . Acute embolism and thrombosis of deep vein of lower extremity ([place]) 08/12/2014  . History of inferior vena caval filter placement 07/14/2014  . Status post insertion of inferior vena caval filter 07/14/2014  . 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 Diabetes mellitus, type 2 ([place]) 06/27/2014  . 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 Tremor of both hands 04/05/2014  . Vertigo 04/05/2014  . BPPV (benign paroxysmal positional vertigo) 03/14/2014  . Seizures ([place]) 12/16/2013  . Epigastric pain 11/01/2013  . Fever blister 10/16/2013  . Hypothyroidism 12/13/2012  . Other and unspecified hyperlipidemia 12/13/2012  . Anxiety state, unspecified 12/13/2012  . Edema 12/13/2012  . Anxiety state 12/13/2012  . Adult hypothyroidism 12/13/2012  . Urinary incontinence   . DEPRESSION 09/24/2007  . Essential hypertension 09/24/2007  . GERD 09/24/2007  . HIATAL HERNIA 09/24/2007  . 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 Headache(784.0) 09/24/2007    Bayard Beaver, [place] 08/08/2015, 10:36 AM  [facility] [address] [place], [place],  [address] Phone: [phone]   Fax:  [phone]  Name: Kelly Fletcher MRN: [id number] Date of Birth: April 05, 1951  This note has been reviewed and edited by supervising CI.  Willow Ora, PTA, [facility] [address] [place], [place] [address] [phone] 08/09/2015, 11:43 AM

## 2015-08-08 NOTE — Patient Instructions (Addendum)
Copyright  VHI. All rights reserved.  Feet Heel-Toe "Tandem", Head Motion - Eyes Open    With eyes open, right foot directly in front of the other. Do not move head at this time. Repeat __10__ times per session. Do __1-2__ sessions per day.  Copyright  VHI. All rights reserved.  Single Leg - Eyes Open    Holding support, lift right leg while maintaining balance over other leg. Progress to removing hands from support surface for longer periods of time. Hold__20__ seconds. Repeat __10__ times per session each leg. Do __1-2__ sessions per day.  Copyright  VHI. All rights reserved.  Functional Quadriceps: Sit to Stand    Sit on edge of chair, feet flat on floor. Stand upright, extending knees fully. Repeat __10__ times per set. Do __1__ sets per session. Do __1-2__ sessions per day.  http://orth.exer.us/735   Copyright  VHI. All rights reserved.  Feet Together, Head Motion - Eyes Open    With eyes open, feet together, move head slowly:  1. up and down. 2. Side to side 3. Diagonally Repeat __10__ times per session. Do __1-2__ sessions per day.  Copyright  VHI. All rights reserved.  Feet Together, Head Motion - Eyes Closed    With eyes closed and feet together, move head slowly,  1. up and down. 2. Side to side 3. Diagonally Repeat _10___ times per session. Do __1-2__ sessions per day.  Copyright  VHI. All rights reserved.

## 2015-08-12 ENCOUNTER — Ambulatory Visit: Payer: Medicare Other | Admitting: Occupational Therapy

## 2015-08-12 ENCOUNTER — Encounter: Payer: Self-pay | Admitting: Occupational Therapy

## 2015-08-12 ENCOUNTER — Ambulatory Visit: Payer: Medicare Other

## 2015-08-12 ENCOUNTER — Ambulatory Visit: Payer: Medicare Other | Admitting: Physical Therapy

## 2015-08-12 ENCOUNTER — Encounter: Payer: Self-pay | Admitting: Physical Therapy

## 2015-08-12 DIAGNOSIS — R2681 Unsteadiness on feet: Secondary | ICD-10-CM

## 2015-08-12 DIAGNOSIS — R4189 Other symptoms and signs involving cognitive functions and awareness: Secondary | ICD-10-CM

## 2015-08-12 DIAGNOSIS — R269 Unspecified abnormalities of gait and mobility: Secondary | ICD-10-CM

## 2015-08-12 DIAGNOSIS — Z7409 Other reduced mobility: Secondary | ICD-10-CM

## 2015-08-12 DIAGNOSIS — R531 Weakness: Secondary | ICD-10-CM

## 2015-08-12 DIAGNOSIS — R4701 Aphasia: Secondary | ICD-10-CM

## 2015-08-12 DIAGNOSIS — R482 Apraxia: Secondary | ICD-10-CM

## 2015-08-12 NOTE — Therapy (Signed)
[facility] [address] [place], [place], [address] Phone: [phone]   Fax:  [phone]  Occupational Therapy Treatment  Patient Details  Name: Kelly Fletcher MRN: [id number] Date of Birth: Feb 07, 1951 Referring Provider: Yvonna Alanis, NFP  Encounter Date: 08/12/2015      OT End of Session - 08/12/15 1209    Visit Number 2   Number of Visits 8   Date for OT Re-Evaluation 08/22/15   Authorization Type medicare will need G code and PN every 10th visit   Authorization Time Period 60 days   Authorization - Visit Number 1   Authorization - Number of Visits 10   OT Start Time 1018   OT Stop Time 1100   OT Time Calculation (min) 42 min   Activity Tolerance Patient tolerated treatment well      Past Medical History  Diagnosis Date  . Uterine prolapse   . Cystocele   . GERD (gastroesophageal reflux disease)   . Elevated cholesterol   . Rectocele   . Hypertension   . Arthritis   . Type II or unspecified type diabetes mellitus without mention of complication, uncontrolled     Type 2  . Urinary incontinence     Urgency  . Anxiety state, unspecified   . Palpitations   . Allergic rhinitis, cause unspecified   . Other and unspecified hyperlipidemia   . Unspecified hypothyroidism   . Edema   . Insomnia, unspecified   . Depressive disorder, not elsewhere classified   . Unspecified urinary incontinence   . Peptic ulcer, unspecified site, unspecified as acute or chronic, without mention of hemorrhage, perforation, or obstruction   . Disturbance of skin sensation   . Calculus of kidney   . Facial spasm 04/05/2014  . Abnormal brain MRI 04/13/2014  . 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  Right face   . Monilial vaginitis 10/23/2014    Past Surgical History  Procedure Laterality Date  . Tonsillectomy and adenoidectomy  1963  . Dilation and curettage of  uterus      3-05  . Hysteroscopy      3-05  . Endometrial biopsy      Dr.Gottsegen   . Tongue biopsy      There were no vitals filed for this visit.  Visit Diagnosis:  Impaired mobility and ADLs  Impaired functional mobility and activity tolerance  Impaired cognition  Generalized weakness      Subjective Assessment - 08/12/15 1020    Subjective  I feel so weak   Patient is accompained by: Family member  husband   Pertinent History see epic   Patient Stated Goals My right leg is weak and making me fall and makes me really tired.     Currently in Pain? Yes   Pain Score 3    Pain Location Leg   Pain Orientation Left   Pain Descriptors / Indicators --  pt unable to describe   Pain Type Chronic pain   Pain Onset 1 to 4 weeks ago   Pain Frequency Intermittent   Aggravating Factors  walking alot or standing   Pain Relieving Factors rest                      OT Treatments/Exercises (OP) - 08/12/15 0001    ADLs   ADL Comments Discussed pros and cons of using 3 in 1 commode over  toilet to increase independence and safety with toilet transfers. Pt and husband reluctant to use equipment however discussed that equipment would allow pt to move toward greater independene and help to prevent falls or pulling on her husband. Husband states they have a 3 in1 at home and he will see if it will fit over toilet,  Discussed option of using raised seat with handles if 3 in1 won't work. Reviewed all goals with pt and husband and they are in agreement with plan.   Neurological Re-education Exercises   Other Exercises 1 Neuro re ed to address static and dynamic balance, alignment, transitional movements, activity tolerance.  Pt requires min a for all activities except static standing - pt needs close supervsion for static stand activities. Pt requires frequent rest breaks.                       OT Long Term Goals - 08/12/15 1206    OT LONG TERM GOAL #1   Title Pt  and husband will be mod I with home program prn - 08/22/2015   Status On-going   OT LONG TERM GOAL #2   Title Pt will be supervision to unload dishwasher    Status On-going   OT LONG TERM GOAL #3   Title Pt will be superivsion for toilet transfers with AE prn   Status On-going   OT LONG TERM GOAL #4   Title Pt and husband will verbalize understanding of safety recommendations as pt progresses through treatment/disease   Status On-going   OT LONG TERM GOAL #5   Title Pt will be supevision for simple sandwich/snack prep   Status On-going   OT LONG TERM GOAL #6   Title Pt will be alble to tolerate 15 minutes of activity at ambuatory level without rest breaks.    Status On-going               Plan - 08/12/15 1208    Clinical Impression Statement Pt is slowly progressing toward goals.  Pt had seizure last week and missed therapy. States she feels weak and tired this week.  Will work at pt's tolerance level.    Pt will benefit from skilled therapeutic intervention in order to improve on the following deficits (Retired) Decreased activity tolerance;Decreased balance;Decreased cognition;Decreased coordination;Decreased safety awareness;Decreased mobility;Decreased knowledge of use of DME;Decreased strength;Impaired UE functional use;Impaired sensation   Rehab Potential Fair   Clinical Impairments Affecting Rehab Potential cognitive impairment, current response to chemo   OT Frequency 2x / week   OT Duration 4 weeks   OT Treatment/Interventions [place]-care/ADL training;Therapeutic exercise;Neuromuscular education;DME and/or AE instruction;Therapist, nutritional;Therapeutic activities;Cognitive remediation/compensation;Patient/family education;Balance training   Plan NMR for balance, functional mobility and activity tolerance; incorporate energy conservation and equipment recommendsations prn   Consulted and Agree with Plan of Care Patient;Family member/caregiver   Family Member  Consulted husband, PAT        Problem List Patient Active Problem List   Diagnosis Date Noted  . Abnormality of gait 07/24/2015  . Right hemiparesis ([place]) 07/24/2015  . Thyroid disease 05/01/2015  . Monilial vaginitis 10/23/2014  . Simple partial seizure, consciousness not impaired ([place]) 09/11/2014  . Deep vein thrombosis ([place]) 08/12/2014  . Acute embolism and thrombosis of deep vein of lower extremity ([place]) 08/12/2014  . History of inferior vena caval filter placement 07/14/2014  . Status post insertion of inferior vena caval filter 07/14/2014  . 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 Diabetes mellitus,  type 2 ([place]) 06/27/2014  . 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 Tremor of both hands 04/05/2014  . Vertigo 04/05/2014  . BPPV (benign paroxysmal positional vertigo) 03/14/2014  . Seizures ([place]) 12/16/2013  . Epigastric pain 11/01/2013  . Fever blister 10/16/2013  . Hypothyroidism 12/13/2012  . Other and unspecified hyperlipidemia 12/13/2012  . Anxiety state, unspecified 12/13/2012  . Edema 12/13/2012  . Anxiety state 12/13/2012  . Adult hypothyroidism 12/13/2012  . Urinary incontinence   . DEPRESSION 09/24/2007  . Essential hypertension 09/24/2007  . GERD 09/24/2007  . HIATAL HERNIA 09/24/2007  . 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 Headache(784.0) 09/24/2007    Quay Burow, OTR/L 08/12/2015, 12:11 PM  [facility] [address] [place], [place], [address] Phone: [phone]   Fax:  [phone]  Name: LAKOTA OLGUIN MRN: [id number] Date of Birth: 1950/09/28

## 2015-08-12 NOTE — Therapy (Signed)
[facility] [address] [place], [place], [address] Phone: [phone]   Fax:  [phone]  Speech Language Pathology Treatment  Patient Details  Name: Kelly Fletcher MRN: [id number] Date of Birth: [birth year] Referring Provider: Edwin Cap, FNP  Encounter Date: 08/12/2015      End of Session - 08/12/15 1600    Visit Number 3   Number of Visits 17   Date for SLP Re-Evaluation 09/20/15   SLP Start Time 0933   SLP Stop Time  1015   SLP Time Calculation (min) 42 min   Activity Tolerance Patient tolerated treatment well      Past Medical History  Diagnosis Date  . Uterine prolapse   . Cystocele   . GERD (gastroesophageal reflux disease)   . Elevated cholesterol   . Rectocele   . Hypertension   . Arthritis   . Type II or unspecified type diabetes mellitus without mention of complication, uncontrolled     Type 2  . Urinary incontinence     Urgency  . Anxiety state, unspecified   . Palpitations   . Allergic rhinitis, cause unspecified   . Other and unspecified hyperlipidemia   . Unspecified hypothyroidism   . Edema   . Insomnia, unspecified   . Depressive disorder, not elsewhere classified   . Unspecified urinary incontinence   . Peptic ulcer, unspecified site, unspecified as acute or chronic, without mention of hemorrhage, perforation, or obstruction   . Disturbance of skin sensation   . Calculus of kidney   . Facial spasm 04/05/2014  . Abnormal brain MRI 04/13/2014  . 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  Right face   . Monilial vaginitis 10/23/2014    Past Surgical History  Procedure Laterality Date  . Tonsillectomy and adenoidectomy  1963  . Dilation and curettage of uterus      3-05  . Hysteroscopy      3-05  . Endometrial biopsy      Dr.Gottsegen   . Tongue biopsy      There were no vitals filed for this visit.  Visit  Diagnosis: Verbal apraxia  Expressive aphasia      Subjective Assessment - 08/12/15 0939    Subjective "I'm having - - a - - bad-- (6 seconds) - bad - time with - -  - - - my - talking."               ADULT SLP TREATMENT - 08/12/15 0951    General Information   Behavior/Cognition Alert;Cooperative   Treatment Provided   Treatment provided Cognitive-Linquistic   Pain Assessment   Pain Assessment No/denies pain   Cognitive-Linquistic Treatment   Treatment focused on Apraxia;Aphasia   Skilled Treatment SLP facilatated practice with pt today for compensations of shorter messages as more efficient. Pt req'd occasional min-mod A from SLP for both reminders for brevity as well as accuracy, given pt's apraxia.  Pt tearful briefly at end of session.   Assessment / Recommendations / Plan   Plan Continue with current plan of care   Progression Toward Goals   Progression toward goals Progressing toward goals          SLP Education - 08/12/15 1559    Education provided Yes   Education Details brevity of message as Arts development officer) Educated Patient   Methods Explanation;Demonstration;Verbal cues   Comprehension Verbalized understanding;Returned demonstration;Verbal cues required;Need further  instruction          SLP Short Term Goals - 08/12/15 1602    SLP SHORT TERM GOAL #1   Title pt will complete HEP for motor speech planning with slow rate 90% success with rare verbal cues   Time 3   Period Weeks   Status On-going   SLP SHORT TERM GOAL #2   Title pt will produce sentences using slow rate 18/20 opportunities   Time 3   Period Weeks   Status On-going   SLP SHORT TERM GOAL #3   Title pt will demo simplified language in mod complex sentence responses 75% of the time   Time 3   Period Weeks   Status On-going   SLP SHORT TERM GOAL #4   Status On-going          SLP Long Term Goals - 08/12/15 1602    SLP LONG TERM GOAL #1   Title pt will  complete HEP for motor speech planning with slow rate 95% success   Time 7   Period Weeks   Status On-going   SLP LONG TERM GOAL #2   Title pt will complete 8 minutes of mod complex conversation using simplified language and other compensations (slow rate, etc) PRN   Time 7   Period Weeks   Status On-going   SLP LONG TERM GOAL #3   Status On-going   SLP LONG TERM GOAL #4   Status On-going          Plan - 08/12/15 1600    Clinical Impression Statement Pt skills in language and speech cont as deficient, hindering pt's QOL. Skills decr further as linguistic complexity, anxiety, and level of emotion increase. She expresses frustration at inability to communicate. Skilled ST is cont'd recommended to cont, with assessment after 4 weeks for efficacy of therapeutic intervention.    Speech Therapy Frequency 2x / week   Duration --  7 weeks   Treatment/Interventions SLP instruction and feedback;Compensatory strategies;Patient/family education;Cueing hierarchy   Potential to Achieve Goals Fair   Potential Considerations Severity of impairments;Co-morbidities;Previous level of function   Consulted and Agree with Plan of Care Patient        Problem List Patient Active Problem List   Diagnosis Date Noted  . Abnormality of gait 07/24/2015  . Right hemiparesis ([place]) 07/24/2015  . Thyroid disease 05/01/2015  . Monilial vaginitis 10/23/2014  . Simple partial seizure, consciousness not impaired ([place]) 09/11/2014  . Deep vein thrombosis ([place]) 08/12/2014  . Acute embolism and thrombosis of deep vein of lower extremity ([place]) 08/12/2014  . History of inferior vena caval filter placement 07/14/2014  . Status post insertion of inferior vena caval filter 07/14/2014  . Malignant glioma of brain ([place]) 06/28/2014  . Diabetes mellitus, type 2 ([place]) 06/27/2014  . 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 Tremor of both hands 04/05/2014  . Vertigo 04/05/2014  . BPPV (benign paroxysmal  positional vertigo) 03/14/2014  . Seizures ([place]) 12/16/2013  . Epigastric pain 11/01/2013  . Fever blister 10/16/2013  . Hypothyroidism 12/13/2012  . Other and unspecified hyperlipidemia 12/13/2012  . Anxiety state, unspecified 12/13/2012  . Edema 12/13/2012  . Anxiety state 12/13/2012  . Adult hypothyroidism 12/13/2012  . Urinary incontinence   . DEPRESSION 09/24/2007  . Essential hypertension 09/24/2007  . GERD 09/24/2007  . HIATAL HERNIA 09/24/2007  . 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 Headache(784.0) 09/24/2007    Kriss Ishler ,[place], [place]  08/12/2015, 4:03 PM  [facility] [address] [place], [place], [address] Phone: [phone]   Fax:  [phone]   Name: Kelly Fletcher MRN: [id number] Date of Birth: [birth year]

## 2015-08-12 NOTE — Therapy (Signed)
[facility] [address] [place], [place], [address] Phone: [phone]   Fax:  [phone]  Physical Therapy Treatment  Patient Details  Name: Kelly Fletcher MRN: [id number] Date of Birth: 18-Oct-1950 Referring Provider: Ladoris Gene, MD  Encounter Date: 08/12/2015      PT End of Session - 08/12/15 1329    Visit Number 3   Number of Visits 17   Date for PT Re-Evaluation 09/20/15   Authorization Type Medicare G-Code & Progress Note every 10th visit   PT Start Time 1103   PT Stop Time 1145   PT Time Calculation (min) 42 min   Equipment Utilized During Treatment Gait belt   Activity Tolerance Patient tolerated treatment well   Behavior During Therapy [facility] for tasks assessed/performed      Past Medical History  Diagnosis Date  . Uterine prolapse   . Cystocele   . GERD (gastroesophageal reflux disease)   . Elevated cholesterol   . Rectocele   . Hypertension   . Arthritis   . Type II or unspecified type diabetes mellitus without mention of complication, uncontrolled     Type 2  . Urinary incontinence     Urgency  . Anxiety state, unspecified   . Palpitations   . Allergic rhinitis, cause unspecified   . Other and unspecified hyperlipidemia   . Unspecified hypothyroidism   . Edema   . Insomnia, unspecified   . Depressive disorder, not elsewhere classified   . Unspecified urinary incontinence   . Peptic ulcer, unspecified site, unspecified as acute or chronic, without mention of hemorrhage, perforation, or obstruction   . Disturbance of skin sensation   . Calculus of kidney   . Facial spasm 04/05/2014  . Abnormal brain MRI 04/13/2014  . 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  Right face   . Monilial vaginitis 10/23/2014    Past Surgical History  Procedure Laterality Date  . Tonsillectomy and adenoidectomy  1963  . Dilation and curettage of  uterus      3-05  . Hysteroscopy      3-05  . Endometrial biopsy      Dr.Gottsegen   . Tongue biopsy      There were no vitals filed for this visit.  Visit Diagnosis:  Impaired functional mobility and activity tolerance  Unsteadiness  Abnormality of gait      Subjective Assessment - 08/12/15 1105    Subjective Patient complains of LLE turning in as she favors R side. Patient and spouse concerned with need for brace L knee. No new falls.    Patient is accompained by: Family member   Pertinent History Hypothyroidism, DM2, HTN, depression, DVT   Limitations Standing;Walking;House hold activities   Patient Stated Goals Walk and talk better.    Currently in Pain? Yes   Pain Score 4    Pain Location Leg   Pain Orientation Left;Lateral          [facility] Adult PT Treatment/Exercise - 08/12/15 1122    Ambulation/Gait   Ambulation/Gait Yes   Ambulation/Gait Assistance 4: Min guard   Ambulation Distance (Feet) 630 Feet   Assistive device None   Gait Pattern Decreased arm swing - left;Decreased step length - right;Decreased stance time - left;Decreased stride length;Decreased hip/knee flexion - left;Decreased dorsiflexion - left;Decreased weight shift to left;Left hip hike;Trunk rotated posteriorly on left;Abducted - left;Poor foot clearance - left;Step-through pattern   Ambulation  Surface Level;Indoor   Stairs Yes   Stairs Assistance 4: Min guard  Verbal cues for sequencing and safety awareness.   Stair Management Technique One rail Left;One rail Right;Alternating pattern;Step to pattern;Forwards  Alternating up, step-to down   Number of Stairs 4   Ramp 4: Min assist   Ramp Details (indicate cue type and reason) Verbal cues for sequencing and safety awareness   Curb 4: Min assist   Curb Details (indicate cue type and reason) Verbal cues for sequencing and safety awareness. Single LOB to left on descent with patient recovery,   Balance   Balance Assessed Yes   Standardized  Balance Assessment   Standardized Balance Assessment Timed Up and Go Test   Timed Up and Go Test   TUG Normal TUG   Normal TUG (seconds) 19.75       Gait Training - Hallway 50' x 6 with no AD, min/guard with head movement up/down, left/right, diagonals to decrease fall risk.       PT Short Term Goals - 07/23/15 1530    PT SHORT TERM GOAL #1   Title Patient demonstrates initial HEP correctly with husband cueing.  (Target Date: 08/16/2015)   Time 4   Period Weeks   Status New   PT SHORT TERM GOAL #2   Title Timed Up-GO <17sec without device with supervision.   (Target Date: 08/16/2015)   Time 4   Period Weeks   Status New   PT SHORT TERM GOAL #3   Title Berg Balance >25/56   (Target Date: 08/16/2015)   Time 4   Period Weeks   Status New   PT SHORT TERM GOAL #4   Title Ambulates 500' without device with supervision.   (Target Date: 08/16/2015)   PT SHORT TERM GOAL #5   Title Patient is able to turn head to scan environment while ambulating and maintain her path with supervision.   (Target Date: 08/16/2015)   Time 4   Period Weeks   Status New           PT Long Term Goals - 07/23/15 1530    PT LONG TERM GOAL #1   Title Patient is independent with ongoing HEP / fitness plan. (Target Date: 09/20/2015)   Time 8   Period Weeks   Status New   PT LONG TERM GOAL #2   Title Berg Balance >45/56 to indicate lower fall risk.  (Target Date: 09/20/2015)   Time 8   Period Weeks   Status New   PT LONG TERM GOAL #3   Title Timed Up & Go <13.5 sec safely  (Target Date: 09/20/2015)   Time 8   Period Weeks   Status New   PT LONG TERM GOAL #4   Title Functional Gait Assessment > 15/30 to indicate lower fall risk.  (Target Date: 09/20/2015)   Time 8   Period Weeks   Status New   PT LONG TERM GOAL #5   Title ambulates >1000' without device including grass, ramps, curbs, stairs (1 rail) without device modified independent.  (Target Date: 09/20/2015)   Time 8   Period Weeks   Status New          Plan - 08/12/15 1330    Clinical Impression Statement Skilled session addressing deficits in gait including barriers. Patient's gait has improved from previous session, with patient progressing to step-through pattern and requiring min guard to min assist this session. Patient continues to struggle with foot clearance LLE secondary to foot  drop which causes patient to stumble, needing increased assist for balance/stability. Patient is making steady progress toward goals.   Pt will benefit from skilled therapeutic intervention in order to improve on the following deficits Abnormal gait;Decreased activity tolerance;Decreased balance;Decreased cognition;Decreased coordination;Decreased endurance;Decreased mobility;Decreased strength   Rehab Potential Good   PT Frequency 2x / week   PT Duration 8 weeks   PT Treatment/Interventions ADLs/[facility] Home Management;Gait training;Stair training;Functional mobility training;Therapeutic activities;Therapeutic exercise;Balance training;Neuromuscular re-education;Patient/family education;Vestibular   PT Next Visit Plan Assess STG's not met. Consider brace/orthotic for foot drop and use of AD (rollator) for increased safety/stability with gait and decreased fall risk. Will discuss with primary PT before discussion with pt.   PT Home Exercise Plan Added Sit to Stand, Corner exercises with feet together head movements (eyes open and closed), tandem stance, single leg stance.   Consulted and Agree with Plan of Care Patient;Family member/caregiver   Family Member Consulted husband        Problem List Patient Active Problem List   Diagnosis Date Noted  . Abnormality of gait 07/24/2015  . Right hemiparesis ([place]) 07/24/2015  . Thyroid disease 05/01/2015  . Monilial vaginitis 10/23/2014  . Simple partial seizure, consciousness not impaired ([place]) 09/11/2014  . Deep vein thrombosis ([place]) 08/12/2014  . Acute embolism and thrombosis of deep vein of lower  extremity ([place]) 08/12/2014  . History of inferior vena caval filter placement 07/14/2014  . Status post insertion of inferior vena caval filter 07/14/2014  . Malignant glioma of brain ([place]) 06/28/2014  . Diabetes mellitus, type 2 ([place]) 06/27/2014  . 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 Tremor of both hands 04/05/2014  . Vertigo 04/05/2014  . BPPV (benign paroxysmal positional vertigo) 03/14/2014  . Seizures ([place]) 12/16/2013  . Epigastric pain 11/01/2013  . Fever blister 10/16/2013  . Hypothyroidism 12/13/2012  . Other and unspecified hyperlipidemia 12/13/2012  . Anxiety state, unspecified 12/13/2012  . Edema 12/13/2012  . Anxiety state 12/13/2012  . Adult hypothyroidism 12/13/2012  . Urinary incontinence   . DEPRESSION 09/24/2007  . Essential hypertension 09/24/2007  . GERD 09/24/2007  . HIATAL HERNIA 09/24/2007  . 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 Headache(784.0) 09/24/2007    Bayard Beaver, [place] 08/12/2015, 1:35 PM  [facility] [address] [place], [place], [address] Phone: [phone]   Fax:  [phone]  Name: Kelly Fletcher MRN: [id number] Date of Birth: Jan 24, 1951  This note has been reviewed and edited by supervising CI.  Willow Ora, PTA, [facility] [address] [place], [place] [address] [phone] 08/13/2015, 10:12 AM

## 2015-08-13 ENCOUNTER — Ambulatory Visit: Payer: Medicare Other | Admitting: Physical Therapy

## 2015-08-13 ENCOUNTER — Encounter: Payer: Self-pay | Admitting: Occupational Therapy

## 2015-08-13 ENCOUNTER — Ambulatory Visit: Payer: Medicare Other | Admitting: Occupational Therapy

## 2015-08-13 ENCOUNTER — Encounter: Payer: Self-pay | Admitting: Physical Therapy

## 2015-08-13 ENCOUNTER — Ambulatory Visit: Payer: Medicare Other

## 2015-08-13 DIAGNOSIS — Z789 Other specified health status: Secondary | ICD-10-CM

## 2015-08-13 DIAGNOSIS — R4189 Other symptoms and signs involving cognitive functions and awareness: Secondary | ICD-10-CM

## 2015-08-13 DIAGNOSIS — R2689 Other abnormalities of gait and mobility: Secondary | ICD-10-CM

## 2015-08-13 DIAGNOSIS — R531 Weakness: Secondary | ICD-10-CM

## 2015-08-13 DIAGNOSIS — Z7409 Other reduced mobility: Secondary | ICD-10-CM

## 2015-08-13 DIAGNOSIS — R2681 Unsteadiness on feet: Secondary | ICD-10-CM

## 2015-08-13 DIAGNOSIS — R269 Unspecified abnormalities of gait and mobility: Secondary | ICD-10-CM

## 2015-08-13 DIAGNOSIS — R4701 Aphasia: Secondary | ICD-10-CM

## 2015-08-13 DIAGNOSIS — R482 Apraxia: Secondary | ICD-10-CM

## 2015-08-13 NOTE — Therapy (Signed)
[facility] [address] [place], [place], [address] Phone: [phone]   Fax:  [phone]  Occupational Therapy Treatment  Patient Details  Name: Kelly Fletcher MRN: [id number] Date of Birth: Sep 09, 1950 Referring Provider: Yvonna Alanis, NFP  Encounter Date: 08/13/2015      OT End of Session - 08/13/15 1228    Visit Number 3   Number of Visits 8   Date for OT Re-Evaluation 08/22/15   Authorization Type medicare will need G code and PN every 10th visit   Authorization Time Period 60 days   Authorization - Visit Number 3   Authorization - Number of Visits 10   OT Start Time 1102   OT Stop Time 1145   OT Time Calculation (min) 43 min   Activity Tolerance Patient tolerated treatment well      Past Medical History  Diagnosis Date  . Uterine prolapse   . Cystocele   . GERD (gastroesophageal reflux disease)   . Elevated cholesterol   . Rectocele   . Hypertension   . Arthritis   . Type II or unspecified type diabetes mellitus without mention of complication, uncontrolled     Type 2  . Urinary incontinence     Urgency  . Anxiety state, unspecified   . Palpitations   . Allergic rhinitis, cause unspecified   . Other and unspecified hyperlipidemia   . Unspecified hypothyroidism   . Edema   . Insomnia, unspecified   . Depressive disorder, not elsewhere classified   . Unspecified urinary incontinence   . Peptic ulcer, unspecified site, unspecified as acute or chronic, without mention of hemorrhage, perforation, or obstruction   . Disturbance of skin sensation   . Calculus of kidney   . Facial spasm 04/05/2014  . Abnormal brain MRI 04/13/2014  . 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  Right face   . Monilial vaginitis 10/23/2014    Past Surgical History  Procedure Laterality Date  . Tonsillectomy and adenoidectomy  1963  . Dilation and curettage of  uterus      3-05  . Hysteroscopy      3-05  . Endometrial biopsy      Dr.Gottsegen   . Tongue biopsy      There were no vitals filed for this visit.  Visit Diagnosis:  Impaired mobility and ADLs  Impaired cognition  Generalized weakness  Balance problems      Subjective Assessment - 08/13/15 1105    Subjective  Am I doing ok?   Patient is accompained by: Family member  husband   Pertinent History see epic   Patient Stated Goals My right leg is weak and making me fall and makes me really tired.     Currently in Pain? No/denies   Pain Score 4                       OT Treatments/Exercises (OP) - 08/13/15 0001    Neurological Re-education Exercises   Other Exercises 1 Neuro re ed to address sit to stand, stand to sit engaging RLE and trunk more fully and improving balance with this activity. Taught pt and husband how to do at home so that pt is more active vs having husband just assist pt into standing - pt able to complete with vc's and close supervision. Pt and husband able to return demonstrate after practice.  Also addressed trunk  control, balance, increasing activity in trunk and RLE/RUE in standing, as well as active weight shift to R, and activity tolerance.                 OT Education - 08/13/15 1227    Education provided Yes   Education Details sit to stand, stand to sit   Person(s) Educated Patient;Spouse   Methods Explanation;Demonstration;Verbal cues;Handout;Tactile cues   Comprehension Verbalized understanding;Returned demonstration             OT Long Term Goals - 08/13/15 1224    OT LONG TERM GOAL #1   Title Pt and husband will be mod I with home program prn - 08/22/2015   Status On-going   OT LONG TERM GOAL #2   Title Pt will be supervision to unload dishwasher    Status On-going   OT LONG TERM GOAL #3   Title Pt will be superivsion for toilet transfers with AE prn   Status On-going   OT LONG TERM GOAL #4   Title Pt and  husband will verbalize understanding of safety recommendations as pt progresses through treatment/disease   Status On-going   OT LONG TERM GOAL #5   Title Pt will be supevision for simple sandwich/snack prep   Status On-going   OT LONG TERM GOAL #6   Title Pt will be alble to tolerate 15 minutes of activity at ambuatory level without rest breaks.    Status On-going               Plan - 08/13/15 1224    Clinical Impression Statement Pt with slow progress toward goals. Pt with worsening ability to understand verbal commands and benefits from demonstration, structure  and repetition to learn new information.   Pt will benefit from skilled therapeutic intervention in order to improve on the following deficits (Retired) Decreased activity tolerance;Decreased balance;Decreased cognition;Decreased coordination;Decreased safety awareness;Decreased mobility;Decreased knowledge of use of DME;Decreased strength;Impaired UE functional use;Impaired sensation   Rehab Potential Fair   Clinical Impairments Affecting Rehab Potential cognitive impairment, current response to chemo   OT Frequency 2x / week   OT Duration 4 weeks   OT Treatment/Interventions [place]-care/ADL training;Therapeutic exercise;Neuromuscular education;DME and/or AE instruction;Therapist, nutritional;Therapeutic activities;Cognitive remediation/compensation;Patient/family education;Balance training   Plan NMR for balance, functional mobility and activity tolerance, incoporate energy conservation and equipment recommendations prn   Consulted and Agree with Plan of Care Patient;Family member/caregiver   Family Member Consulted husband, PAT        Problem List Patient Active Problem List   Diagnosis Date Noted  . Abnormality of gait 07/24/2015  . Right hemiparesis ([place]) 07/24/2015  . Thyroid disease 05/01/2015  . Monilial vaginitis 10/23/2014  . Simple partial seizure, consciousness not impaired ([place]) 09/11/2014  . Deep  vein thrombosis ([place]) 08/12/2014  . Acute embolism and thrombosis of deep vein of lower extremity ([place]) 08/12/2014  . History of inferior vena caval filter placement 07/14/2014  . Status post insertion of inferior vena caval filter 07/14/2014  . Malignant glioma of brain ([place]) 06/28/2014  . Diabetes mellitus, type 2 ([place]) 06/27/2014  . 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 Tremor of both hands 04/05/2014  . Vertigo 04/05/2014  . BPPV (benign paroxysmal positional vertigo) 03/14/2014  . Seizures ([place]) 12/16/2013  . Epigastric pain 11/01/2013  . Fever blister 10/16/2013  . Hypothyroidism 12/13/2012  . Other and unspecified hyperlipidemia 12/13/2012  . Anxiety state, unspecified 12/13/2012  . Edema 12/13/2012  . Anxiety state 12/13/2012  .  Adult hypothyroidism 12/13/2012  . Urinary incontinence   . DEPRESSION 09/24/2007  . Essential hypertension 09/24/2007  . GERD 09/24/2007  . HIATAL HERNIA 09/24/2007  . 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 Headache(784.0) 09/24/2007    Quay Burow, OTR/L 08/13/2015, 12:29 PM  [facility] [address] [place], [place], [address] Phone: [phone]   Fax:  [phone]  Name: Kelly Fletcher MRN: [id number] Date of Birth: [birth year]

## 2015-08-13 NOTE — Patient Instructions (Signed)
Sit to stand, stand to sit with husband assisting to encourage increased active participation by pt.

## 2015-08-13 NOTE — Therapy (Signed)
[facility] [address] [place], [place], [address] Phone: [phone]   Fax:  [phone]  Speech Language Pathology Treatment  Patient Details  Name: Kelly Fletcher MRN: [id number] Date of Birth: 1951/04/09 Referring Provider: Edwin Cap, FNP  Encounter Date: 08/13/2015      End of Session - 08/13/15 1019    Visit Number 4   Number of Visits 17   Date for SLP Re-Evaluation 09/20/15   SLP Start Time 0935   SLP Stop Time  1015   SLP Time Calculation (min) 40 min   Activity Tolerance Patient tolerated treatment well  tearful at end of session      Past Medical History  Diagnosis Date  . Uterine prolapse   . Cystocele   . GERD (gastroesophageal reflux disease)   . Elevated cholesterol   . Rectocele   . Hypertension   . Arthritis   . Type II or unspecified type diabetes mellitus without mention of complication, uncontrolled     Type 2  . Urinary incontinence     Urgency  . Anxiety state, unspecified   . Palpitations   . Allergic rhinitis, cause unspecified   . Other and unspecified hyperlipidemia   . Unspecified hypothyroidism   . Edema   . Insomnia, unspecified   . Depressive disorder, not elsewhere classified   . Unspecified urinary incontinence   . Peptic ulcer, unspecified site, unspecified as acute or chronic, without mention of hemorrhage, perforation, or obstruction   . Disturbance of skin sensation   . Calculus of kidney   . Facial spasm 04/05/2014  . Abnormal brain MRI 04/13/2014  . 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  Right face   . Monilial vaginitis 10/23/2014    Past Surgical History  Procedure Laterality Date  . Tonsillectomy and adenoidectomy  1963  . Dilation and curettage of uterus      3-05  . Hysteroscopy      3-05  . Endometrial biopsy      Dr.Gottsegen   . Tongue biopsy      There were no vitals filed for  this visit.  Visit Diagnosis: Verbal apraxia  Expressive aphasia      Subjective Assessment - 08/13/15 0942    Subjective "I don't know - --  she - - -- - - understood me." (re: student yesterday)               ADULT SLP TREATMENT - 08/13/15 0943    General Information   Behavior/Cognition Alert;Cooperative   Treatment Provided   Treatment provided Cognitive-Linquistic   Pain Assessment   Pain Assessment 0-10   Pain Score 4    Pain Location rt leg   Pain Descriptors / Indicators Aching  intermittent   Pain Intervention(s) Monitored during session   Cognitive-Linquistic Treatment   Treatment focused on Apraxia;Aphasia   Skilled Treatment SLP used written language scaffolding to assist pt in simple sentence generation 85-90% success ("he/she/they is/are _____ing."). When written scaffolding ("He/she/they  is/are ____ing") was removed, pt's success decr'd to approx 75% success with mod cues occasionally.    Assessment / Recommendations / Plan   Plan Continue with current plan of care   Progression Toward Goals   Progression toward goals Progressing toward goals          SLP Education - 08/12/15 1559    Education provided Yes   Education Details brevity of  message as Arts development officer) Educated Patient   Methods Explanation;Demonstration;Verbal cues   Comprehension Verbalized understanding;Returned demonstration;Verbal cues required;Need further instruction          SLP Short Term Goals - 08/12/15 1602    SLP SHORT TERM GOAL #1   Title pt will complete HEP for motor speech planning with slow rate 90% success with rare verbal cues   Time 3   Period Weeks   Status On-going   SLP SHORT TERM GOAL #2   Title pt will produce sentences using slow rate 18/20 opportunities   Time 3   Period Weeks   Status On-going   SLP SHORT TERM GOAL #3   Title pt will demo simplified language in mod complex sentence responses 75% of the time   Time 3    Period Weeks   Status On-going   SLP SHORT TERM GOAL #4   Status On-going          SLP Long Term Goals - 08/12/15 1602    SLP LONG TERM GOAL #1   Title pt will complete HEP for motor speech planning with slow rate 95% success   Time 7   Period Weeks   Status On-going   SLP LONG TERM GOAL #2   Title pt will complete 8 minutes of mod complex conversation using simplified language and other compensations (slow rate, etc) PRN   Time 7   Period Weeks   Status On-going   SLP LONG TERM GOAL #3   Status On-going   SLP LONG TERM GOAL #4   Status On-going          Plan - 08/13/15 1020    Clinical Impression Statement Pt skills in language and speech cont as deficient, hindering pt's QOL. Skills decr further as linguistic complexity, anxiety, and level of emotion increase. She expresses frustration at inability to communicate. Skilled ST is cont'd recommended to cont, with assessment after 4 weeks for efficacy of therapeutic intervention.    Speech Therapy Frequency 2x / week   Duration --  7 weeks   Treatment/Interventions SLP instruction and feedback;Compensatory strategies;Patient/family education;Cueing hierarchy   Potential to Achieve Goals Fair   Potential Considerations Severity of impairments;Co-morbidities;Previous level of function        Problem List Patient Active Problem List   Diagnosis Date Noted  . Abnormality of gait 07/24/2015  . Right hemiparesis ([place]) 07/24/2015  . Thyroid disease 05/01/2015  . Monilial vaginitis 10/23/2014  . Simple partial seizure, consciousness not impaired ([place]) 09/11/2014  . Deep vein thrombosis ([place]) 08/12/2014  . Acute embolism and thrombosis of deep vein of lower extremity ([place]) 08/12/2014  . History of inferior vena caval filter placement 07/14/2014  . Status post insertion of inferior vena caval filter 07/14/2014  . Malignant glioma of brain ([place]) 06/28/2014  . Diabetes mellitus, type 2 ([place]) 06/27/2014  . Focal motor seizure  disorder ([name]) 05/04/2014  . Tremor of both hands 04/05/2014  . Vertigo 04/05/2014  . BPPV (benign paroxysmal positional vertigo) 03/14/2014  . Seizures ([place]) 12/16/2013  . Epigastric pain 11/01/2013  . Fever blister 10/16/2013  . Hypothyroidism 12/13/2012  . Other and unspecified hyperlipidemia 12/13/2012  . Anxiety state, unspecified 12/13/2012  . Edema 12/13/2012  . Anxiety state 12/13/2012  . Adult hypothyroidism 12/13/2012  . Urinary incontinence   . DEPRESSION 09/24/2007  . Essential hypertension 09/24/2007  . GERD 09/24/2007  . HIATAL HERNIA 09/24/2007  . 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 Headache(784.0) 09/24/2007  [facility] ,[place], [place]   08/13/2015, 10:58 AM  [facility] [address] [place], [place], [address] Phone: [phone]   Fax:  [phone]   Name: Kelly Fletcher MRN: [id number] Date of Birth: [birth year]

## 2015-08-14 NOTE — Therapy (Signed)
[facility] [address] [place], [place], [address] Phone: [phone]   Fax:  [phone]  Physical Therapy Treatment  Patient Details  Name: Kelly Fletcher MRN: [id number] Date of Birth: 04/23/1951 Referring Provider: Ladoris Gene, MD  Encounter Date: 08/13/2015      PT End of Session - 08/13/15 1100    Visit Number 4   Number of Visits 17   Date for PT Re-Evaluation 09/20/15   Authorization Type Medicare G-Code & Progress Note every 10th visit   PT Start Time 1016   PT Stop Time 1100   PT Time Calculation (min) 44 min   Equipment Utilized During Treatment Gait belt   Activity Tolerance Patient tolerated treatment well   Behavior During Therapy [facility] for tasks assessed/performed      Past Medical History  Diagnosis Date  . Uterine prolapse   . Cystocele   . GERD (gastroesophageal reflux disease)   . Elevated cholesterol   . Rectocele   . Hypertension   . Arthritis   . Type II or unspecified type diabetes mellitus without mention of complication, uncontrolled     Type 2  . Urinary incontinence     Urgency  . Anxiety state, unspecified   . Palpitations   . Allergic rhinitis, cause unspecified   . Other and unspecified hyperlipidemia   . Unspecified hypothyroidism   . Edema   . Insomnia, unspecified   . Depressive disorder, not elsewhere classified   . Unspecified urinary incontinence   . Peptic ulcer, unspecified site, unspecified as acute or chronic, without mention of hemorrhage, perforation, or obstruction   . Disturbance of skin sensation   . Calculus of kidney   . Facial spasm 04/05/2014  . Abnormal brain MRI 04/13/2014  . 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  Right face   . Monilial vaginitis 10/23/2014    Past Surgical History  Procedure Laterality Date  . Tonsillectomy and adenoidectomy  1963  . Dilation and curettage of  uterus      3-05  . Hysteroscopy      3-05  . Endometrial biopsy      Dr.Gottsegen   . Tongue biopsy      There were no vitals filed for this visit.  Visit Diagnosis:  Impaired mobility and ADLs  Impaired cognition  Generalized weakness  Balance problems  Unsteadiness  Abnormality of gait      Subjective Assessment - 08/13/15 1015    Subjective Patient reports no falls but husband assisting with all gait.    Patient is accompained by: Family member   Pertinent History Hypothyroidism, DM2, HTN, depression, DVT   Limitations Standing;Walking;House hold activities   Patient Stated Goals Walk and talk better.    Currently in Pain? No/denies            [facility] PT Assessment - 08/13/15 1015    Berg Balance Test   Sit to Stand Able to stand  independently using hands   Standing Unsupported Able to stand safely 2 minutes   Sitting with Back Unsupported but Feet Supported on Floor or Stool Able to sit safely and securely 2 minutes   Stand to Sit Controls descent by using hands   Transfers Able to transfer safely, definite need of hands   Standing Unsupported with Eyes Closed Able to stand 10 seconds with supervision   Standing Ubsupported with Feet Together Needs help to attain  position but able to stand for 30 seconds with feet together   From Standing, Reach Forward with Outstretched Arm Can reach forward >5 cm safely (2")   From Standing Position, Pick up Object from Floor Unable to pick up shoe, but reaches 2-5 cm (1-2") from shoe and balances independently   From Standing Position, Turn to Look Behind Over each Shoulder Needs supervision when turning   Turn 360 Degrees Needs assistance while turning   Standing Unsupported, Alternately Place Feet on Step/Stool Able to complete >2 steps/needs minimal assist   Standing Unsupported, One Foot in [place] balance while stepping or standing   Standing on One Leg Tries to lift leg/unable to hold 3 seconds but remains  standing independently   Total Score 28                     [facility] Adult PT Treatment/Exercise - 08/13/15 1015    Ambulation/Gait   Ambulation/Gait Yes   Ambulation/Gait Assistance 4: Min assist   Ambulation/Gait Assistance Details PT assessed gait with [place] AFOs (one posterior & one anterior lower leg support) and Foot-up Orthosis. Patient has recurvatum in stance less with anterior support. Adequate foot clearance with all three devices but fatigue ~200' causes repetitive toe catching requiring constant minA. Patient's personality would probably have greater compliance with wear to assist during her awake hours with Foot-Up orthosis.    Ambulation Distance (Feet) 300 Feet  50' X 5 assessing AFOs, 300'   Assistive device None   Ambulation Surface Indoor;Level                PT Education - 08/13/15 1055    Education provided Yes   Education Details AFOs & foot drop,    Person(s) Educated Patient;Spouse   Methods Explanation;Demonstration;Verbal cues   Comprehension Verbalized understanding;Returned demonstration;Verbal cues required;Need further instruction          PT Short Term Goals - 08/13/15 1100    PT SHORT TERM GOAL #1   Title Patient demonstrates initial HEP correctly with husband cueing.  (Target Date: 08/16/2015)   Time 4   Period Weeks   Status Achieved   PT SHORT TERM GOAL #2   Title Timed Up-GO <17sec without device with supervision.   (Target Date: 08/16/2015)   Baseline 19.75 on 08/12/15   Status Not Met   PT SHORT TERM GOAL #3   Title Berg Balance >25/56   (Target Date: 08/16/2015)   Baseline MET 08/13/2015 Kelly Fletcher Balance 28/56   Time 4   Period Weeks   Status Achieved   PT SHORT TERM GOAL #4   Title Ambulates 500' without device with supervision.   (Target Date: 08/16/2015)   Status Not Met   PT SHORT TERM GOAL #5   Title Patient is able to turn head to scan environment while ambulating and maintain her path with supervision.   (Target  Date: 08/16/2015)   Time 4   Period Weeks   Status Not Met           PT Long Term Goals - 08/13/15 1100    PT LONG TERM GOAL #1   Title Patient is independent with ongoing HEP / fitness plan. (Target Date: 09/20/2015)   Time 8   Period Weeks   Status On-going   PT LONG TERM GOAL #2   Title Berg Balance >45/56 to indicate lower fall risk.  (Target Date: 09/20/2015)   Time 8   Period Weeks   Status On-going  PT LONG TERM GOAL #3   Title Timed Up & Go <15.5 sec safely  (Target Date: 09/20/2015)   Time 8   Period Weeks   Status Revised   PT LONG TERM GOAL #4   Title Functional Gait Assessment > 15/30 to indicate lower fall risk.  (Target Date: 09/20/2015)   Time 8   Period Weeks   Status On-going   PT LONG TERM GOAL #5   Title ambulates >500' with LRAD including grass, ramps, curbs, stairs (1 rail) with supervision.  (Target Date: 09/20/2015)   Time 8   Period Weeks   Status Revised               Problem List Patient Active Problem List   Diagnosis Date Noted  . Abnormality of gait 07/24/2015  . Right hemiparesis ([place]) 07/24/2015  . Thyroid disease 05/01/2015  . Monilial vaginitis 10/23/2014  . Simple partial seizure, consciousness not impaired ([place]) 09/11/2014  . Deep vein thrombosis ([place]) 08/12/2014  . Acute embolism and thrombosis of deep vein of lower extremity ([place]) 08/12/2014  . History of inferior vena caval filter placement 07/14/2014  . Status post insertion of inferior vena caval filter 07/14/2014  . Malignant glioma of brain ([place]) 06/28/2014  . Diabetes mellitus, type 2 ([place]) 06/27/2014  . 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 Tremor of both hands 04/05/2014  . Vertigo 04/05/2014  . BPPV (benign paroxysmal positional vertigo) 03/14/2014  . Seizures ([place]) 12/16/2013  . Epigastric pain 11/01/2013  . Fever blister 10/16/2013  . Hypothyroidism 12/13/2012  . Other and unspecified hyperlipidemia 12/13/2012  . Anxiety state, unspecified  12/13/2012  . Edema 12/13/2012  . Anxiety state 12/13/2012  . Adult hypothyroidism 12/13/2012  . Urinary incontinence   . DEPRESSION 09/24/2007  . Essential hypertension 09/24/2007  . GERD 09/24/2007  . HIATAL HERNIA 09/24/2007  . 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 BMZTAEWY(574.9) 09/24/2007    Maxx Pham PT, DPT 08/14/2015, 8:07 AM  [facility] [address] [place], [place], [address] Phone: [phone]   Fax:  [phone]  Name: Kelly Fletcher MRN: [id number] Date of Birth: [birth year]

## 2015-08-19 ENCOUNTER — Ambulatory Visit: Payer: Medicare Other | Admitting: Occupational Therapy

## 2015-08-19 ENCOUNTER — Encounter: Payer: Self-pay | Admitting: Occupational Therapy

## 2015-08-19 DIAGNOSIS — Z789 Other specified health status: Secondary | ICD-10-CM

## 2015-08-19 DIAGNOSIS — R531 Weakness: Secondary | ICD-10-CM

## 2015-08-19 DIAGNOSIS — G8191 Hemiplegia, unspecified affecting right dominant side: Secondary | ICD-10-CM

## 2015-08-19 DIAGNOSIS — Z7409 Other reduced mobility: Secondary | ICD-10-CM

## 2015-08-19 DIAGNOSIS — R4189 Other symptoms and signs involving cognitive functions and awareness: Secondary | ICD-10-CM

## 2015-08-19 NOTE — Therapy (Signed)
[facility] [address] [place], [place], [address] Phone: [phone]   Fax:  [phone]  Occupational Therapy Treatment  Patient Details  Name: Kelly Fletcher MRN: [id number] Date of Birth: 1950-11-10 Referring Provider: Yvonna Alanis, NFP  Encounter Date: 08/19/2015      OT End of Session - 08/19/15 1130    Visit Number 4   Number of Visits 8   Date for OT Re-Evaluation 09/05/15  date adjusted as pt missed two weeks of therapy since eval   Authorization Type medicare will need G code and PN every 10th visit   Authorization Time Period 60 days   Authorization - Visit Number 4   Authorization - Number of Visits 10   OT Start Time 1016   OT Stop Time 1110   OT Time Calculation (min) 54 min   Activity Tolerance Patient tolerated treatment well      Past Medical History  Diagnosis Date  . Uterine prolapse   . Cystocele   . GERD (gastroesophageal reflux disease)   . Elevated cholesterol   . Rectocele   . Hypertension   . Arthritis   . Type II or unspecified type diabetes mellitus without mention of complication, uncontrolled     Type 2  . Urinary incontinence     Urgency  . Anxiety state, unspecified   . Palpitations   . Allergic rhinitis, cause unspecified   . Other and unspecified hyperlipidemia   . Unspecified hypothyroidism   . Edema   . Insomnia, unspecified   . Depressive disorder, not elsewhere classified   . Unspecified urinary incontinence   . Peptic ulcer, unspecified site, unspecified as acute or chronic, without mention of hemorrhage, perforation, or obstruction   . Disturbance of skin sensation   . Calculus of kidney   . Facial spasm 04/05/2014  . Abnormal brain MRI 04/13/2014  . 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  Right face   . Monilial vaginitis 10/23/2014    Past Surgical History  Procedure Laterality Date  .  Tonsillectomy and adenoidectomy  1963  . Dilation and curettage of uterus      3-05  . Hysteroscopy      3-05  . Endometrial biopsy      Dr.Gottsegen   . Tongue biopsy      There were no vitals filed for this visit.  Visit Diagnosis:  Impaired mobility and ADLs  Impaired cognition  Generalized weakness  Impaired functional mobility and activity tolerance  Hemiplegia affecting right dominant side (HCC)      Subjective Assessment - 08/19/15 1025    Subjective  My left arm bothers me.   Patient is accompained by: Family member  husband   Pertinent History see epic   Patient Stated Goals My right leg is weak and making me fall and makes me really tired.     Currently in Pain? No/denies  stiffness in R hand but no pain                      OT Treatments/Exercises (OP) - 08/19/15 0001    ADLs   Functional Mobility Practiced functional mobility using walker to see if pt can be more independent in toilet transfers. Pt and husband initially resistant however pt is more steady using walker with close supervision/occassional min a on days her balance is worse. Discussed pros and cons and pointed out  to pt and husband that using the walker allows the pt to be more active in ambulation (vs husband holding pt up) as well as decreases fall risk by widening.  Discussed using it only on days that pt's balance is worse not necessarily all the time. Pt is apraxic therefore will need to practice here in the clinic for this to be a useful intermittent strategy at home. Pt and husband more open to at least trying it here in the clinic for now. Pt very fatigued today and requring more frequent rest breaks.Balance also more impaired today. Also practiced bed mobility as pt is havng a harder time going from supine to sit.    ADL Comments Pt's husband reports pt sleeps on her left side all the time and all night long and that pt's left arm is bothering her.  Husband also points out that her  LUE is the arm that is always accessed for chemo. Practiced bed positioining with pt on her back  with roll under knees, folded towels under each scapula and pillows supporting both UE's Pt stated she was comfortable and felt she could sleep at least part the night in this position. Husband could verbalize undertstanding and they will attempt at home.                       OT Long Term Goals - 08/19/15 1124    OT LONG TERM GOAL #1   Title Pt and husband will be mod I with home program prn - 09/05/2015 (datea adjusted as pt missed 2 weeks of therapy from eval date)   Status On-going   OT LONG TERM GOAL #2   Title Pt will be supervision to unload dishwasher    Status Achieved   OT LONG TERM GOAL #3   Title Pt will be superivsion for toilet transfers with AE prn   Status On-going   OT LONG TERM GOAL #4   Title Pt and husband will verbalize understanding of safety recommendations as pt progresses through treatment/disease   Status On-going   OT LONG TERM GOAL #5   Title Pt will be supevision for simple sandwich/snack prep   Status On-going   OT LONG TERM GOAL #6   Title Pt will be alble to tolerate 15 minutes of activity at ambuatory level without rest breaks.    Status On-going               Plan - 08/19/15 1128    Clinical Impression Statement Pt with slow progress toward goals. Pt and husband very resistant to use of any AE - will need further discussions and time to allow pt and husband time to adjust to recommendations.   Pt will benefit from skilled therapeutic intervention in order to improve on the following deficits (Retired) Decreased activity tolerance;Decreased balance;Decreased cognition;Decreased coordination;Decreased safety awareness;Decreased mobility;Decreased knowledge of use of DME;Decreased strength;Impaired UE functional use;Impaired sensation   Rehab Potential Fair   Clinical Impairments Affecting Rehab Potential cognitive impairment, current  response to chemo   OT Frequency 2x / week   OT Duration 4 weeks   OT Treatment/Interventions [place]-care/ADL training;Therapeutic exercise;Neuromuscular education;DME and/or AE instruction;Therapist, nutritional;Therapeutic activities;Cognitive remediation/compensation;Patient/family education;Balance training   Plan NMR for balance, practice using walker, increasing activity tolerance if possible,    Consulted and Agree with Plan of Care Patient;Family member/caregiver   Family Member Consulted husband, PAT        Problem List Patient Active Problem List  Diagnosis Date Noted  . Abnormality of gait 07/24/2015  . Right hemiparesis ([place]) 07/24/2015  . Thyroid disease 05/01/2015  . Monilial vaginitis 10/23/2014  . Simple partial seizure, consciousness not impaired ([place]) 09/11/2014  . Deep vein thrombosis ([place]) 08/12/2014  . Acute embolism and thrombosis of deep vein of lower extremity ([place]) 08/12/2014  . History of inferior vena caval filter placement 07/14/2014  . Status post insertion of inferior vena caval filter 07/14/2014  . Malignant glioma of brain ([place]) 06/28/2014  . Diabetes mellitus, type 2 ([place]) 06/27/2014  . 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 Tremor of both hands 04/05/2014  . Vertigo 04/05/2014  . BPPV (benign paroxysmal positional vertigo) 03/14/2014  . Seizures ([place]) 12/16/2013  . Epigastric pain 11/01/2013  . Fever blister 10/16/2013  . Hypothyroidism 12/13/2012  . Other and unspecified hyperlipidemia 12/13/2012  . Anxiety state, unspecified 12/13/2012  . Edema 12/13/2012  . Anxiety state 12/13/2012  . Adult hypothyroidism 12/13/2012  . Urinary incontinence   . DEPRESSION 09/24/2007  . Essential hypertension 09/24/2007  . GERD 09/24/2007  . HIATAL HERNIA 09/24/2007  . 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 Headache(784.0) 09/24/2007    Quay Burow, OTR/L 08/19/2015, 11:33 AM  [facility] [address] [place], [place], [address] Phone: [phone]   Fax:  [phone]  Name: VY HARTKE MRN: [id number] Date of Birth: 21-Nov-1950

## 2015-08-20 ENCOUNTER — Ambulatory Visit: Payer: Medicare Other

## 2015-08-20 ENCOUNTER — Ambulatory Visit: Payer: Medicare Other | Admitting: Physical Therapy

## 2015-08-20 ENCOUNTER — Ambulatory Visit: Payer: Medicare Other | Admitting: Occupational Therapy

## 2015-08-20 ENCOUNTER — Encounter: Payer: Self-pay | Admitting: Physical Therapy

## 2015-08-20 ENCOUNTER — Encounter: Payer: Self-pay | Admitting: Occupational Therapy

## 2015-08-20 VITALS — BP 151/88 | HR 57

## 2015-08-20 DIAGNOSIS — R482 Apraxia: Secondary | ICD-10-CM

## 2015-08-20 DIAGNOSIS — R531 Weakness: Secondary | ICD-10-CM | POA: Diagnosis not present

## 2015-08-20 DIAGNOSIS — R2681 Unsteadiness on feet: Secondary | ICD-10-CM

## 2015-08-20 DIAGNOSIS — Z7409 Other reduced mobility: Secondary | ICD-10-CM

## 2015-08-20 DIAGNOSIS — R4189 Other symptoms and signs involving cognitive functions and awareness: Secondary | ICD-10-CM

## 2015-08-20 DIAGNOSIS — Z789 Other specified health status: Secondary | ICD-10-CM

## 2015-08-20 DIAGNOSIS — G8191 Hemiplegia, unspecified affecting right dominant side: Secondary | ICD-10-CM

## 2015-08-20 DIAGNOSIS — R2689 Other abnormalities of gait and mobility: Secondary | ICD-10-CM

## 2015-08-20 DIAGNOSIS — R4701 Aphasia: Secondary | ICD-10-CM

## 2015-08-20 NOTE — Patient Instructions (Signed)
Keep your messages short and to the point. You did better with this today!

## 2015-08-20 NOTE — Therapy (Signed)
[facility] [address] [place], [place], [address] Phone: [phone]   Fax:  [phone]  Speech Language Pathology Treatment  Patient Details  Name: Kelly Fletcher MRN: [id number] Date of Birth: 02-17-1951 Referring Provider: Edwin Cap, FNP  Encounter Date: 08/20/2015      End of Session - 08/20/15 1237    Visit Number 5   Number of Visits 17   Date for SLP Re-Evaluation 09/20/15   SLP Start Time 95   SLP Stop Time  1230   SLP Time Calculation (min) 40 min   Activity Tolerance Patient tolerated treatment well      Past Medical History  Diagnosis Date  . Uterine prolapse   . Cystocele   . GERD (gastroesophageal reflux disease)   . Elevated cholesterol   . Rectocele   . Hypertension   . Arthritis   . Type II or unspecified type diabetes mellitus without mention of complication, uncontrolled     Type 2  . Urinary incontinence     Urgency  . Anxiety state, unspecified   . Palpitations   . Allergic rhinitis, cause unspecified   . Other and unspecified hyperlipidemia   . Unspecified hypothyroidism   . Edema   . Insomnia, unspecified   . Depressive disorder, not elsewhere classified   . Unspecified urinary incontinence   . Peptic ulcer, unspecified site, unspecified as acute or chronic, without mention of hemorrhage, perforation, or obstruction   . Disturbance of skin sensation   . Calculus of kidney   . Facial spasm 04/05/2014  . Abnormal brain MRI 04/13/2014  . 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  Right face   . Monilial vaginitis 10/23/2014    Past Surgical History  Procedure Laterality Date  . Tonsillectomy and adenoidectomy  1963  . Dilation and curettage of uterus      3-05  . Hysteroscopy      3-05  . Endometrial biopsy      Dr.Gottsegen   . Tongue biopsy      There were no vitals filed for this visit.  Visit  Diagnosis: Verbal apraxia  Expressive aphasia      Subjective Assessment - 08/20/15 1205    Subjective "My - - granddaugh-ter saw me (this weekend). He - she came - over. It was - - hard." "I'm - so tired."   Currently in Pain? No/denies               ADULT SLP TREATMENT - 08/20/15 1212    General Information   Behavior/Cognition Alert;Cooperative   Treatment Provided   Treatment provided Cognitive-Linquistic   Cognitive-Linquistic Treatment   Treatment focused on Apraxia;Aphasia   Skilled Treatment SLP engaged pt in language tasks today in order to improve expressive language. In describing pictures (common items) with 2-3 words/phrases pt req'd mod A usually. With common foods, pt produced 1-3 word phrases and SLP min-mod cues usually required.   Assessment / Recommendations / Plan   Plan Continue with current plan of care   Progression Toward Goals   Progression toward goals Progressing toward goals            SLP Short Term Goals - 08/20/15 1239    SLP SHORT TERM GOAL #1   Title pt will complete HEP for motor speech planning with slow rate 90% success with rare verbal cues   Time 2   Period Weeks  Status On-going   SLP SHORT TERM GOAL #2   Title pt will produce sentences using slow rate 18/20 opportunities   Time 2   Period Weeks   Status On-going   SLP SHORT TERM GOAL #3   Title pt will demo simplified language in mod complex sentence responses 75% of the time   Time 2   Period Weeks   Status On-going   SLP SHORT TERM GOAL #4   Status On-going          SLP Long Term Goals - 08/20/15 1240    SLP LONG TERM GOAL #1   Title pt will complete HEP for motor speech planning with slow rate 95% success   Time 6   Period Weeks   Status On-going   SLP LONG TERM GOAL #2   Title pt will complete 8 minutes of mod complex conversation using simplified language and other compensations (slow rate, etc) PRN   Time 6   Period Weeks   Status On-going   SLP  LONG TERM GOAL #3   Title pt will demo knowledge of the need to simplify her verbal expression by using shortened messages to communicate, with rare min A to do so   Time 6   Period Weeks   Status New   SLP LONG TERM GOAL #4   Status On-going          Plan - 08/20/15 1238    Clinical Impression Statement Pt appeared more relaxed and less anxious today during therapy and performace, overall, was improved. SLP stressed telegraphic speech/shortening the length of her verbal output. Pt skills decr'd, however, as linguistic complexity increased. She cont to express frustration at inability to communicate. Skilled ST is cont'd recommended to cont, with assessment after 4 weeks for efficacy of therapeutic intervention.    Speech Therapy Frequency 2x / week   Duration --  6 weeks   Treatment/Interventions SLP instruction and feedback;Compensatory strategies;Patient/family education;Cueing hierarchy   Potential to Achieve Goals Fair   Potential Considerations Severity of impairments;Co-morbidities;Previous level of function        Problem List Patient Active Problem List   Diagnosis Date Noted  . Abnormality of gait 07/24/2015  . Right hemiparesis ([place]) 07/24/2015  . Thyroid disease 05/01/2015  . Monilial vaginitis 10/23/2014  . Simple partial seizure, consciousness not impaired ([place]) 09/11/2014  . Deep vein thrombosis ([place]) 08/12/2014  . Acute embolism and thrombosis of deep vein of lower extremity ([place]) 08/12/2014  . History of inferior vena caval filter placement 07/14/2014  . Status post insertion of inferior vena caval filter 07/14/2014  . Malignant glioma of brain ([place]) 06/28/2014  . Diabetes mellitus, type 2 ([place]) 06/27/2014  . 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 Tremor of both hands 04/05/2014  . Vertigo 04/05/2014  . BPPV (benign paroxysmal positional vertigo) 03/14/2014  . Seizures ([place]) 12/16/2013  . Epigastric pain 11/01/2013  . Fever blister 10/16/2013   . Hypothyroidism 12/13/2012  . Other and unspecified hyperlipidemia 12/13/2012  . Anxiety state, unspecified 12/13/2012  . Edema 12/13/2012  . Anxiety state 12/13/2012  . Adult hypothyroidism 12/13/2012  . Urinary incontinence   . DEPRESSION 09/24/2007  . Essential hypertension 09/24/2007  . GERD 09/24/2007  . HIATAL HERNIA 09/24/2007  . 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 Headache(784.0) 09/24/2007    SCHINKE,CARL ,[place], [place]  08/20/2015, 12:42 PM  [facility] [address] [place], [place], [address] Phone: [phone]   Fax:  [phone]  Name: Kelly Fletcher MRN: [id number] Date of Birth: 02-04-1951

## 2015-08-20 NOTE — Therapy (Signed)
[facility] [address] [place], [place], [address] Phone: [phone]   Fax:  [phone]  Physical Therapy Treatment  Patient Details  Name: Kelly Fletcher MRN: [id number] Date of Birth: August 02, 1950 Referring Provider: Ladoris Gene, MD  Encounter Date: 08/20/2015      PT End of Session - 08/20/15 1646    Visit Number 5   Number of Visits 17   Date for PT Re-Evaluation 09/20/15   Authorization Type Medicare G-Code & Progress Note every 10th visit   PT Start Time 1015   PT Stop Time 1100   PT Time Calculation (min) 45 min   Equipment Utilized During Treatment Gait belt   Activity Tolerance Patient tolerated treatment well   Behavior During Therapy [facility] for tasks assessed/performed      Past Medical History  Diagnosis Date  . Uterine prolapse   . Cystocele   . GERD (gastroesophageal reflux disease)   . Elevated cholesterol   . Rectocele   . Hypertension   . Arthritis   . Type II or unspecified type diabetes mellitus without mention of complication, uncontrolled     Type 2  . Urinary incontinence     Urgency  . Anxiety state, unspecified   . Palpitations   . Allergic rhinitis, cause unspecified   . Other and unspecified hyperlipidemia   . Unspecified hypothyroidism   . Edema   . Insomnia, unspecified   . Depressive disorder, not elsewhere classified   . Unspecified urinary incontinence   . Peptic ulcer, unspecified site, unspecified as acute or chronic, without mention of hemorrhage, perforation, or obstruction   . Disturbance of skin sensation   . Calculus of kidney   . Facial spasm 04/05/2014  . Abnormal brain MRI 04/13/2014  . 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  Right face   . Monilial vaginitis 10/23/2014    Past Surgical History  Procedure Laterality Date  . Tonsillectomy and adenoidectomy  1963  . Dilation and curettage of  uterus      3-05  . Hysteroscopy      3-05  . Endometrial biopsy      Dr.Gottsegen   . Tongue biopsy      There were no vitals filed for this visit.  Visit Diagnosis:  Impaired mobility and ADLs  Impaired cognition  Generalized weakness  Impaired functional mobility and activity tolerance  Hemiplegia affecting right dominant side (HCC)  Balance problems  Unsteadiness      Subjective Assessment - 08/20/15 1015    Subjective Patient reports no falls but husband continues to assist.   Patient is accompained by: Family member   Pertinent History Hypothyroidism, DM2, HTN, depression, DVT   Limitations Standing;Walking;House hold activities   Patient Stated Goals Walk and talk better.    Currently in Pain? Yes   Pain Score 4    Pain Location Leg   Pain Orientation Left;Lateral   Pain Descriptors / Indicators Other (Comment)  unable to describe   Pain Type Chronic pain   Pain Onset 1 to 4 weeks ago   Pain Frequency Intermittent   Aggravating Factors  walking or standing   Pain Relieving Factors rest     Gait Training: Patient ambulated from lobby to gym area 100' without device with modA constantly to maxA with balance losses to right multiple times.  Patient wore athletic walking shoe that will support use of AFO to assess  if assisting with foot drop. PT had long discussion with pt & husband about use of walker & AFO to enable improved safety and patient function even though he will still have assist her. Patient concern with image & accepting function is to level that a walker is being recommended. Patient ambulated 150' with RW (no AFO) with min guard constantly and MinA for balance losses with apraxia of running into furniture, wall & counter on right side.  Patient ambulated 64' with RW with husband with MinA. He reports that he was able to see need for less assistance with use of RW. PT donned Ott[place]ock Flex Walk AFO. Patient ambulated 100' with AFO & no device with  minA constantly and modA for balance losses.  Patient ambulated 100' with RW & AFO with min guard constantly and minA for balance loss with improved left foot clearance in swing and initial contact in line of progression, stance with less knee /hip flexion and improved stance duration.                             PT Education - 08/20/15 1200    Education provided Yes   Education Details use of AFO &/or walker to facilitate mobility with less assitance, bathroom use of BSC over toilet for armrests and adding grabbars to shower to improve safety with stepping over 5" ledge   Person(s) Educated Patient;Spouse   Methods Explanation;Verbal cues   Comprehension Verbalized understanding;Verbal cues required;Need further instruction          PT Short Term Goals - 08/13/15 1100    PT SHORT TERM GOAL #1   Title Patient demonstrates initial HEP correctly with husband cueing.  (Target Date: 08/16/2015)   Time 4   Period Weeks   Status Achieved   PT SHORT TERM GOAL #2   Title Timed Up-GO <17sec without device with supervision.   (Target Date: 08/16/2015)   Baseline 19.75 on 08/12/15   Status Not Met   PT SHORT TERM GOAL #3   Title Berg Balance >25/56   (Target Date: 08/16/2015)   Baseline MET 08/13/2015 [name] Balance 28/56   Time 4   Period Weeks   Status Achieved   PT SHORT TERM GOAL #4   Title Ambulates 500' without device with supervision.   (Target Date: 08/16/2015)   Status Not Met   PT SHORT TERM GOAL #5   Title Patient is able to turn head to scan environment while ambulating and maintain her path with supervision.   (Target Date: 08/16/2015)   Time 4   Period Weeks   Status Not Met           PT Long Term Goals - 08/13/15 1100    PT LONG TERM GOAL #1   Title Patient is independent with ongoing HEP / fitness plan. (Target Date: 09/20/2015)   Time 8   Period Weeks   Status On-going   PT LONG TERM GOAL #2   Title Berg Balance >45/56 to indicate lower fall  risk.  (Target Date: 09/20/2015)   Time 8   Period Weeks   Status On-going   PT LONG TERM GOAL #3   Title Timed Up & Go <15.5 sec safely  (Target Date: 09/20/2015)   Time 8   Period Weeks   Status Revised   PT LONG TERM GOAL #4   Title Functional Gait Assessment > 15/30 to indicate lower fall risk.  (Target Date: 09/20/2015)  Time 8   Period Weeks   Status On-going   PT LONG TERM GOAL #5   Title ambulates >500' with LRAD including grass, ramps, curbs, stairs (1 rail) with supervision.  (Target Date: 09/20/2015)   Time 8   Period Weeks   Status Revised               Plan - 08/20/15 1646    Clinical Impression Statement Patient required less assistance when ambulating with RW &/or AFO. Patient's foot drop was compensated with Ottobock Flex Walk AFO even when she fatigued. Patient and husband are still uncertain about use of devices. Patient had new onset of issue with running into items on right side today.    Pt will benefit from skilled therapeutic intervention in order to improve on the following deficits Abnormal gait;Decreased activity tolerance;Decreased balance;Decreased cognition;Decreased coordination;Decreased endurance;Decreased mobility;Decreased strength;Pain   Rehab Potential Good   PT Frequency 2x / week   PT Duration 8 weeks   PT Treatment/Interventions ADLs/[facility];Gait training;Stair training;Functional mobility training;Therapeutic activities;Therapeutic exercise;Balance training;Neuromuscular re-education;Patient/family education;Vestibular   PT Next Visit Plan Gait & balance with AFO & RW.    PT Home Exercise Plan Added Sit to Stand, Corner exercises with feet together head movements (eyes open and closed), tandem stance, single leg stance.   Consulted and Agree with Plan of Care Patient;Family member/caregiver   Family Member Consulted husband        Problem List Patient Active Problem List   Diagnosis Date Noted  . Abnormality of  gait 07/24/2015  . Right hemiparesis ([place]) 07/24/2015  . Thyroid disease 05/01/2015  . Monilial vaginitis 10/23/2014  . Simple partial seizure, consciousness not impaired ([place]) 09/11/2014  . Deep vein thrombosis ([place]) 08/12/2014  . Acute embolism and thrombosis of deep vein of lower extremity ([place]) 08/12/2014  . History of inferior vena caval filter placement 07/14/2014  . Status post insertion of inferior vena caval filter 07/14/2014  . Malignant glioma of brain ([place]) 06/28/2014  . Diabetes mellitus, type 2 ([place]) 06/27/2014  . 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 Tremor of both hands 04/05/2014  . Vertigo 04/05/2014  . BPPV (benign paroxysmal positional vertigo) 03/14/2014  . Seizures ([place]) 12/16/2013  . Epigastric pain 11/01/2013  . Fever blister 10/16/2013  . Hypothyroidism 12/13/2012  . Other and unspecified hyperlipidemia 12/13/2012  . Anxiety state, unspecified 12/13/2012  . Edema 12/13/2012  . Anxiety state 12/13/2012  . Adult hypothyroidism 12/13/2012  . Urinary incontinence   . DEPRESSION 09/24/2007  . Essential hypertension 09/24/2007  . GERD 09/24/2007  . HIATAL HERNIA 09/24/2007  . 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 PVXYIAXK(553.7) 09/24/2007    Daritza Brees PT, DPT 08/20/2015, 4:51 PM  [facility] [address] [place], [place], [address] Phone: [phone]   Fax:  [phone]  Name: Kelly Fletcher MRN: [id number] Date of Birth: Dec 10, 1950

## 2015-08-20 NOTE — Therapy (Signed)
[facility] [address] [place], [place], [address] Phone: [phone]   Fax:  [phone]  Occupational Therapy Treatment  Patient Details  Name: Kelly Fletcher MRN: [id number] Date of Birth: [birth year] Referring Provider: Yvonna Alanis, NFP  Encounter Date: 08/20/2015      OT End of Session - 08/20/15 1240    Visit Number 5   Number of Visits 8   Date for OT Re-Evaluation 09/05/15   Authorization Type medicare will need G code and PN every 10th visit   Authorization Time Period 60 days   Authorization - Visit Number 5   Authorization - Number of Visits 10   OT Start Time 1102   OT Stop Time 1145   OT Time Calculation (min) 43 min   Activity Tolerance Patient tolerated treatment well      Past Medical History  Diagnosis Date  . Uterine prolapse   . Cystocele   . GERD (gastroesophageal reflux disease)   . Elevated cholesterol   . Rectocele   . Hypertension   . Arthritis   . Type II or unspecified type diabetes mellitus without mention of complication, uncontrolled     Type 2  . Urinary incontinence     Urgency  . Anxiety state, unspecified   . Palpitations   . Allergic rhinitis, cause unspecified   . Other and unspecified hyperlipidemia   . Unspecified hypothyroidism   . Edema   . Insomnia, unspecified   . Depressive disorder, not elsewhere classified   . Unspecified urinary incontinence   . Peptic ulcer, unspecified site, unspecified as acute or chronic, without mention of hemorrhage, perforation, or obstruction   . Disturbance of skin sensation   . Calculus of kidney   . Facial spasm 04/05/2014  . Abnormal brain MRI 04/13/2014  . 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  Right face   . Monilial vaginitis 10/23/2014    Past Surgical History  Procedure Laterality Date  . Tonsillectomy and adenoidectomy  1963  . Dilation and curettage of  uterus      3-05  . Hysteroscopy      3-05  . Endometrial biopsy      Dr.Gottsegen   . Tongue biopsy      Filed Vitals:   08/20/15 1110  BP: 151/88  Pulse: 57    Visit Diagnosis:  Impaired mobility and ADLs  Impaired cognition  Generalized weakness  Impaired functional mobility and activity tolerance  Hemiplegia affecting right dominant side (HCC)      Subjective Assessment - 08/20/15 1105    Subjective  The PT is trying to decide between a brace or the walker for her balance (pt's husband report)   Patient is accompained by: Family member  husband   Pertinent History see epic   Patient Stated Goals My right leg is weak and making me fall and makes me really tired.     Currently in Pain? No/denies                      OT Treatments/Exercises (OP) - 08/20/15 0001    ADLs   Functional Mobility Practiced using walker today for toilet transfers. Pt is  more steady on walker however pt is no demonstrating spatial deficits regarding movement in space especially on the right side (i.e running into walls on R, difficulty navigating doorways on the right,) and appears unaware. Pt also  with more pronounced apraxia with walker as well as with transtional movements.  Pt required several rest breaks. Discussed pros and cons of walker as well as toilet set up again to allow for fluctuation in pt's ability from day to day.                      OT Long Term Goals - 08/20/15 1238    OT LONG TERM GOAL #1   Title Pt and husband will be mod I with home program prn - 09/05/2015 (datea adjusted as pt missed 2 weeks of therapy from eval date)   Status On-going   OT LONG TERM GOAL #2   Title Pt will be supervision to unload dishwasher    Status Achieved   OT LONG TERM GOAL #3   Title Pt will be superivsion for toilet transfers with AE prn   Status On-going   OT LONG TERM GOAL #4   Title Pt and husband will verbalize understanding of safety recommendations as pt  progresses through treatment/disease   Status On-going   OT LONG TERM GOAL #5   Title Pt will be supevision for simple sandwich/snack prep   Status On-going   OT LONG TERM GOAL #6   Title Pt will be alble to tolerate 15 minutes of activity at ambuatory level without rest breaks.    Status On-going               Plan - 08/20/15 1238    Clinical Impression Statement Pt fluctuating from day to day with functional ability.  Focus of treatment is help educate pt and husband regarding options for task modification as well as AE given pt's changing functional ability.    Pt will benefit from skilled therapeutic intervention in order to improve on the following deficits (Retired) Decreased activity tolerance;Decreased balance;Decreased cognition;Decreased coordination;Decreased safety awareness;Decreased mobility;Decreased knowledge of use of DME;Decreased strength;Impaired UE functional use;Impaired sensation   Rehab Potential Fair   Clinical Impairments Affecting Rehab Potential cognitive impairment, current response to chemo   OT Frequency 2x / week   OT Duration 4 weeks   OT Treatment/Interventions [place]-care/ADL training;Therapeutic exercise;Neuromuscular education;DME and/or AE instruction;Therapist, nutritional;Therapeutic activities;Cognitive remediation/compensation;Patient/family education;Balance training   Plan NMR for balance, practice using walker for functional mobility, increasing activity tolerance if possible, task modification as needed.   Consulted and Agree with Plan of Care Patient;Family member/caregiver   Family Member Consulted husband, PAT        Problem List Patient Active Problem List   Diagnosis Date Noted  . Abnormality of gait 07/24/2015  . Right hemiparesis ([place]) 07/24/2015  . Thyroid disease 05/01/2015  . Monilial vaginitis 10/23/2014  . Simple partial seizure, consciousness not impaired ([place]) 09/11/2014  . Deep vein thrombosis ([place]) 08/12/2014   . Acute embolism and thrombosis of deep vein of lower extremity ([place]) 08/12/2014  . History of inferior vena caval filter placement 07/14/2014  . Status post insertion of inferior vena caval filter 07/14/2014  . Malignant glioma of brain ([place]) 06/28/2014  . Diabetes mellitus, type 2 ([place]) 06/27/2014  . 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 Tremor of both hands 04/05/2014  . Vertigo 04/05/2014  . BPPV (benign paroxysmal positional vertigo) 03/14/2014  . Seizures ([place]) 12/16/2013  . Epigastric pain 11/01/2013  . Fever blister 10/16/2013  . Hypothyroidism 12/13/2012  . Other and unspecified hyperlipidemia 12/13/2012  . Anxiety state, unspecified 12/13/2012  . Edema 12/13/2012  . Anxiety state 12/13/2012  . Adult  hypothyroidism 12/13/2012  . Urinary incontinence   . DEPRESSION 09/24/2007  . Essential hypertension 09/24/2007  . GERD 09/24/2007  . HIATAL HERNIA 09/24/2007  . 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 Headache(784.0) 09/24/2007    Quay Burow, OTR/L 08/20/2015, 12:43 PM  [facility] [address] [place], [place], [address] Phone: [phone]   Fax:  [phone]  Name: Kelly Fletcher MRN: [id number] Date of Birth: September 17, 1950

## 2015-08-22 ENCOUNTER — Encounter: Payer: Self-pay | Admitting: Physical Therapy

## 2015-08-22 ENCOUNTER — Ambulatory Visit: Payer: Medicare Other

## 2015-08-22 ENCOUNTER — Ambulatory Visit: Payer: Medicare Other | Admitting: Physical Therapy

## 2015-08-22 DIAGNOSIS — R531 Weakness: Secondary | ICD-10-CM | POA: Diagnosis not present

## 2015-08-22 DIAGNOSIS — R4701 Aphasia: Secondary | ICD-10-CM

## 2015-08-22 DIAGNOSIS — G8191 Hemiplegia, unspecified affecting right dominant side: Secondary | ICD-10-CM

## 2015-08-22 DIAGNOSIS — R269 Unspecified abnormalities of gait and mobility: Secondary | ICD-10-CM

## 2015-08-22 DIAGNOSIS — R2681 Unsteadiness on feet: Secondary | ICD-10-CM

## 2015-08-22 DIAGNOSIS — Z7409 Other reduced mobility: Secondary | ICD-10-CM

## 2015-08-22 DIAGNOSIS — R482 Apraxia: Secondary | ICD-10-CM

## 2015-08-22 DIAGNOSIS — R2689 Other abnormalities of gait and mobility: Secondary | ICD-10-CM

## 2015-08-22 NOTE — Therapy (Signed)
[facility] [address] [place], [place], [address] Phone: [phone]   Fax:  [phone]  Speech Language Pathology Treatment  Patient Details  Name: Kelly Fletcher MRN: [id number] Date of Birth: 01/01/1951 Referring Provider: Edwin Cap, FNP  Encounter Date: 08/22/2015      End of Session - 08/22/15 1101    Visit Number 6   Number of Visits 17   Date for SLP Re-Evaluation 09/20/15   SLP Start Time 49   SLP Stop Time  1100   SLP Time Calculation (min) 40 min   Activity Tolerance Patient tolerated treatment well      Past Medical History  Diagnosis Date  . Uterine prolapse   . Cystocele   . GERD (gastroesophageal reflux disease)   . Elevated cholesterol   . Rectocele   . Hypertension   . Arthritis   . Type II or unspecified type diabetes mellitus without mention of complication, uncontrolled     Type 2  . Urinary incontinence     Urgency  . Anxiety state, unspecified   . Palpitations   . Allergic rhinitis, cause unspecified   . Other and unspecified hyperlipidemia   . Unspecified hypothyroidism   . Edema   . Insomnia, unspecified   . Depressive disorder, not elsewhere classified   . Unspecified urinary incontinence   . Peptic ulcer, unspecified site, unspecified as acute or chronic, without mention of hemorrhage, perforation, or obstruction   . Disturbance of skin sensation   . Calculus of kidney   . Facial spasm 04/05/2014  . Abnormal brain MRI 04/13/2014  . 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  Right face   . Monilial vaginitis 10/23/2014    Past Surgical History  Procedure Laterality Date  . Tonsillectomy and adenoidectomy  1963  . Dilation and curettage of uterus      3-05  . Hysteroscopy      3-05  . Endometrial biopsy      Dr.Gottsegen   . Tongue biopsy      There were no vitals filed for this visit.  Visit  Diagnosis: Verbal apraxia  Expressive aphasia      Subjective Assessment - 08/22/15 1027    Subjective In therapy room alone, however husband accompanied pt to [facility] room - then sat outside.               ADULT SLP TREATMENT - 08/22/15 1028    General Information   Behavior/Cognition Alert;Cooperative   Treatment Provided   Treatment provided Cognitive-Linquistic   Pain Assessment   Pain Assessment 0-10   Pain Score 6    Pain Location rt knee   Pain Descriptors / Indicators Aching   Pain Intervention(s) Monitored during session   Cognitive-Linquistic Treatment   Treatment focused on Apraxia;Aphasia   Skilled Treatment Pt's speech/language (subjectively, to this SLP) worse than last session. Pt tearful 4 times during session due to this. SLP facilitated this more-diffcult session for patient (emotionally) by having her name one thing about pictures - 65% success. SLP asked pt a "wh" question re: her initial response and pt 40% successful at answering those questions.    Assessment / Recommendations / Plan   Plan Continue with current plan of care   Progression Toward Goals   Progression toward goals Not progressing toward goals (comment)  pt worse with verbal output - d/t chemo last week?  SLP Short Term Goals - 08/22/15 1247    SLP SHORT TERM GOAL #1   Title pt will complete HEP for motor speech planning with slow rate 90% success with rare verbal cues   Time 2   Period Weeks   Status On-going   SLP SHORT TERM GOAL #2   Title pt will produce sentences using slow rate 18/20 opportunities   Time 2   Period Weeks   Status On-going   SLP SHORT TERM GOAL #3   Title pt will demo simplified language in mod complex sentence responses 75% of the time   Time 2   Period Weeks   Status On-going   SLP SHORT TERM GOAL #4   Status On-going          SLP Long Term Goals - 08/22/15 1248    SLP LONG TERM GOAL #1   Title pt will complete HEP for motor speech  planning with slow rate 95% success   Time 6   Period Weeks   Status On-going   SLP LONG TERM GOAL #2   Title pt will complete 8 minutes of mod complex conversation using simplified language and other compensations (slow rate, etc) PRN   Time 6   Period Weeks   Status On-going   SLP LONG TERM GOAL #3   Title pt will demo knowledge of the need to simplify her verbal expression by using shortened messages to communicate, with rare min A to do so   Time 6   Period Weeks   Status On-going   SLP LONG TERM GOAL #4   Status On-going          Plan - 08/22/15 1108    Clinical Impression Statement Pt did not do well in today's session with her verbal output, likely due to deconditioning from chemotherapy last week. Cont skilled ST to improve verbal expression is still warranted for at least two weeks.   Speech Therapy Frequency 2x / week   Duration --  6 weeks   Treatment/Interventions SLP instruction and feedback;Compensatory strategies;Patient/family education;Cueing hierarchy   Potential to Achieve Goals Fair   Potential Considerations Severity of impairments;Co-morbidities;Previous level of function        Problem List Patient Active Problem List   Diagnosis Date Noted  . Abnormality of gait 07/24/2015  . Right hemiparesis ([place]) 07/24/2015  . Thyroid disease 05/01/2015  . Monilial vaginitis 10/23/2014  . Simple partial seizure, consciousness not impaired ([place]) 09/11/2014  . Deep vein thrombosis ([place]) 08/12/2014  . Acute embolism and thrombosis of deep vein of lower extremity ([place]) 08/12/2014  . History of inferior vena caval filter placement 07/14/2014  . Status post insertion of inferior vena caval filter 07/14/2014  . Malignant glioma of brain ([place]) 06/28/2014  . Diabetes mellitus, type 2 ([place]) 06/27/2014  . 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 Tremor of both hands 04/05/2014  . Vertigo 04/05/2014  . BPPV (benign paroxysmal positional vertigo) 03/14/2014  .  Seizures ([place]) 12/16/2013  . Epigastric pain 11/01/2013  . Fever blister 10/16/2013  . Hypothyroidism 12/13/2012  . Other and unspecified hyperlipidemia 12/13/2012  . Anxiety state, unspecified 12/13/2012  . Edema 12/13/2012  . Anxiety state 12/13/2012  . Adult hypothyroidism 12/13/2012  . Urinary incontinence   . DEPRESSION 09/24/2007  . Essential hypertension 09/24/2007  . GERD 09/24/2007  . HIATAL HERNIA 09/24/2007  . 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 Headache(784.0) 09/24/2007    SCHINKE,CARL ,[place], [place]  08/22/2015, 12:48 PM  [facility]  [facility] [address] [place], [place], [address] Phone: [phone]   Fax:  [phone]   Name: Kelly Fletcher MRN: [id number] Date of Birth: 1950-06-04

## 2015-08-23 NOTE — Therapy (Signed)
[facility] [address] [place], [place], [address] Phone: [phone]   Fax:  [phone]  Physical Therapy Treatment  Patient Details  Name: Kelly Fletcher MRN: [id number] Date of Birth: [birth year] Referring Provider: Ladoris Gene, MD  Encounter Date: 08/22/2015      PT End of Session - 08/22/15 1150    Visit Number 6   Number of Visits 17   Date for PT Re-Evaluation 09/20/15   Authorization Type Medicare G-Code & Progress Note every 10th visit   PT Start Time 1103   PT Stop Time 1145   PT Time Calculation (min) 42 min   Equipment Utilized During Treatment Gait belt   Activity Tolerance Patient tolerated treatment well   Behavior During Therapy [facility] for tasks assessed/performed      Past Medical History  Diagnosis Date  . Uterine prolapse   . Cystocele   . GERD (gastroesophageal reflux disease)   . Elevated cholesterol   . Rectocele   . Hypertension   . Arthritis   . Type II or unspecified type diabetes mellitus without mention of complication, uncontrolled     Type 2  . Urinary incontinence     Urgency  . Anxiety state, unspecified   . Palpitations   . Allergic rhinitis, cause unspecified   . Other and unspecified hyperlipidemia   . Unspecified hypothyroidism   . Edema   . Insomnia, unspecified   . Depressive disorder, not elsewhere classified   . Unspecified urinary incontinence   . Peptic ulcer, unspecified site, unspecified as acute or chronic, without mention of hemorrhage, perforation, or obstruction   . Disturbance of skin sensation   . Calculus of kidney   . Facial spasm 04/05/2014  . Abnormal brain MRI 04/13/2014  . 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  Right face   . Monilial vaginitis 10/23/2014    Past Surgical History  Procedure Laterality Date  . Tonsillectomy and adenoidectomy  1963  . Dilation and curettage of  uterus      3-05  . Hysteroscopy      3-05  . Endometrial biopsy      Dr.Gottsegen   . Tongue biopsy      There were no vitals filed for this visit.  Visit Diagnosis:  Generalized weakness  Impaired functional mobility and activity tolerance  Hemiplegia affecting right dominant side (HCC)  Balance problems  Unsteadiness  Abnormality of gait      Subjective Assessment - 08/22/15 1106    Subjective No falls. Husband reports she seems to walk better at night.    Patient is accompained by: Family member   Pertinent History Hypothyroidism, DM2, HTN, depression, DVT   Limitations Standing;Walking;House hold activities   Patient Stated Goals Walk and talk better.    Currently in Pain? No/denies     Airline pilot / Training: Patient ambulated 125' without device or AFO with modA constantly and maxA with balance losses. Patient ambulated 150' with Ottobock Reaction AFO only with minA constantly and modA for balance losses with knee recurvatum. PT added 1/4" heel wedge to AFO to decrease recurvatum. Patient ambulated 250' with Ottobock Reaction AFO / heel wedge with minA constantly & modA for balance losses. Pt ambulated 250' with RW & AFO with min guard constantly & minA for balance losses. PT donned Allard Toe-Off AFO with 1/4" heel wedge. Patient ambulated 150' with RW & AFO with min guard  and minA for balance losses.                               PT Short Term Goals - 08/13/15 1100    PT SHORT TERM GOAL #1   Title Patient demonstrates initial HEP correctly with husband cueing.  (Target Date: 08/16/2015)   Time 4   Period Weeks   Status Achieved   PT SHORT TERM GOAL #2   Title Timed Up-GO <17sec without device with supervision.   (Target Date: 08/16/2015)   Baseline 19.75 on 08/12/15   Status Not Met   PT SHORT TERM GOAL #3   Title Berg Balance >25/56   (Target Date: 08/16/2015)   Baseline MET 08/13/2015 [name] Balance 28/56   Time 4   Period Weeks    Status Achieved   PT SHORT TERM GOAL #4   Title Ambulates 500' without device with supervision.   (Target Date: 08/16/2015)   Status Not Met   PT SHORT TERM GOAL #5   Title Patient is able to turn head to scan environment while ambulating and maintain her path with supervision.   (Target Date: 08/16/2015)   Time 4   Period Weeks   Status Not Met           PT Long Term Goals - 08/13/15 1100    PT LONG TERM GOAL #1   Title Patient is independent with ongoing HEP / fitness plan. (Target Date: 09/20/2015)   Time 8   Period Weeks   Status On-going   PT LONG TERM GOAL #2   Title Berg Balance >45/56 to indicate lower fall risk.  (Target Date: 09/20/2015)   Time 8   Period Weeks   Status On-going   PT LONG TERM GOAL #3   Title Timed Up & Go <15.5 sec safely  (Target Date: 09/20/2015)   Time 8   Period Weeks   Status Revised   PT LONG TERM GOAL #4   Title Functional Gait Assessment > 15/30 to indicate lower fall risk.  (Target Date: 09/20/2015)   Time 8   Period Weeks   Status On-going   PT LONG TERM GOAL #5   Title ambulates >500' with LRAD including grass, ramps, curbs, stairs (1 rail) with supervision.  (Target Date: 09/20/2015)   Time 8   Period Weeks   Status Revised               Plan - 08/22/15 1145    Clinical Impression Statement Patient required less assistance with AFO & RW. Use of Ottobock Reaction AFO assists toe clearance in swing but slight supination. Allard Toe-Off AFO improved slight supination but repeatedly had toe clearance ~20% of time however this may be due to fatigue.    Pt will benefit from skilled therapeutic intervention in order to improve on the following deficits Abnormal gait;Decreased activity tolerance;Decreased balance;Decreased cognition;Decreased coordination;Decreased endurance;Decreased mobility;Decreased strength;Pain   Rehab Potential Good   PT Frequency 2x / week   PT Duration 8 weeks   PT Treatment/Interventions ADLs/[facility]  Management;Gait training;Stair training;Functional mobility training;Therapeutic activities;Therapeutic exercise;Balance training;Neuromuscular re-education;Patient/family education;Vestibular   PT Next Visit Plan Gait & balance with AFO & RW starting with Allard Toe-Off next time.   PT Home Exercise Plan Added Sit to Stand, Corner exercises with feet together head movements (eyes open and closed), tandem stance, single leg stance.   Consulted and Agree with Plan of Care Patient;Family member/caregiver  Family Member Consulted husband        Problem List Patient Active Problem List   Diagnosis Date Noted  . Abnormality of gait 07/24/2015  . Right hemiparesis ([place]) 07/24/2015  . Thyroid disease 05/01/2015  . Monilial vaginitis 10/23/2014  . Simple partial seizure, consciousness not impaired ([place]) 09/11/2014  . Deep vein thrombosis ([place]) 08/12/2014  . Acute embolism and thrombosis of deep vein of lower extremity ([place]) 08/12/2014  . History of inferior vena caval filter placement 07/14/2014  . Status post insertion of inferior vena caval filter 07/14/2014  . Malignant glioma of brain ([place]) 06/28/2014  . Diabetes mellitus, type 2 ([place]) 06/27/2014  . 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 Tremor of both hands 04/05/2014  . Vertigo 04/05/2014  . BPPV (benign paroxysmal positional vertigo) 03/14/2014  . Seizures ([place]) 12/16/2013  . Epigastric pain 11/01/2013  . Fever blister 10/16/2013  . Hypothyroidism 12/13/2012  . Other and unspecified hyperlipidemia 12/13/2012  . Anxiety state, unspecified 12/13/2012  . Edema 12/13/2012  . Anxiety state 12/13/2012  . Adult hypothyroidism 12/13/2012  . Urinary incontinence   . DEPRESSION 09/24/2007  . Essential hypertension 09/24/2007  . GERD 09/24/2007  . HIATAL HERNIA 09/24/2007  . 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 BWGYKZLD(357.0) 09/24/2007    WALDRON,ROBIN PT, DPT 08/23/2015, 12:34 PM  [facility] [address] [place], [place], [address] Phone: [phone]   Fax:  [phone]  Name: Kelly Fletcher MRN: [id number] Date of Birth: Mar 24, 1951

## 2015-08-26 ENCOUNTER — Ambulatory Visit: Payer: Medicare Other | Admitting: Physical Therapy

## 2015-08-26 ENCOUNTER — Ambulatory Visit: Payer: Medicare Other | Admitting: Occupational Therapy

## 2015-08-26 ENCOUNTER — Encounter: Payer: Self-pay | Admitting: Occupational Therapy

## 2015-08-26 ENCOUNTER — Encounter: Payer: Self-pay | Admitting: Physical Therapy

## 2015-08-26 ENCOUNTER — Ambulatory Visit: Payer: Medicare Other

## 2015-08-26 DIAGNOSIS — R482 Apraxia: Secondary | ICD-10-CM

## 2015-08-26 DIAGNOSIS — R4189 Other symptoms and signs involving cognitive functions and awareness: Secondary | ICD-10-CM

## 2015-08-26 DIAGNOSIS — R531 Weakness: Secondary | ICD-10-CM | POA: Diagnosis not present

## 2015-08-26 DIAGNOSIS — R4701 Aphasia: Secondary | ICD-10-CM

## 2015-08-26 DIAGNOSIS — R2689 Other abnormalities of gait and mobility: Secondary | ICD-10-CM

## 2015-08-26 DIAGNOSIS — Z789 Other specified health status: Secondary | ICD-10-CM

## 2015-08-26 DIAGNOSIS — G8191 Hemiplegia, unspecified affecting right dominant side: Secondary | ICD-10-CM

## 2015-08-26 DIAGNOSIS — Z7409 Other reduced mobility: Secondary | ICD-10-CM

## 2015-08-26 DIAGNOSIS — R269 Unspecified abnormalities of gait and mobility: Secondary | ICD-10-CM

## 2015-08-26 NOTE — Therapy (Signed)
[facility] [address] [place], [place], [address] Phone: [phone]   Fax:  [phone]  Occupational Therapy Treatment  Patient Details  Name: Kelly Fletcher MRN: [id number] Date of Birth: 06-16-1950 Referring Provider: Yvonna Alanis, NFP  Encounter Date: 08/26/2015      OT End of Session - 08/26/15 1219    Visit Number 6   Number of Visits 8   Date for OT Re-Evaluation 09/05/15   Authorization Type medicare will need G code and PN every 10th visit   Authorization Time Period 60 days   Authorization - Visit Number 6   Authorization - Number of Visits 10   OT Start Time 1016   OT Stop Time 1057   OT Time Calculation (min) 41 min      Past Medical History  Diagnosis Date  . Uterine prolapse   . Cystocele   . GERD (gastroesophageal reflux disease)   . Elevated cholesterol   . Rectocele   . Hypertension   . Arthritis   . Type II or unspecified type diabetes mellitus without mention of complication, uncontrolled     Type 2  . Urinary incontinence     Urgency  . Anxiety state, unspecified   . Palpitations   . Allergic rhinitis, cause unspecified   . Other and unspecified hyperlipidemia   . Unspecified hypothyroidism   . Edema   . Insomnia, unspecified   . Depressive disorder, not elsewhere classified   . Unspecified urinary incontinence   . Peptic ulcer, unspecified site, unspecified as acute or chronic, without mention of hemorrhage, perforation, or obstruction   . Disturbance of skin sensation   . Calculus of kidney   . Facial spasm 04/05/2014  . Abnormal brain MRI 04/13/2014  . 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  Right face   . Monilial vaginitis 10/23/2014    Past Surgical History  Procedure Laterality Date  . Tonsillectomy and adenoidectomy  1963  . Dilation and curettage of uterus      3-05  . Hysteroscopy      3-05  .  Endometrial biopsy      Dr.Gottsegen   . Tongue biopsy      There were no vitals filed for this visit.  Visit Diagnosis:  Impaired functional mobility and activity tolerance  Generalized weakness  Hemiplegia affecting right dominant side (HCC)  Balance problems  Impaired mobility and ADLs  Impaired cognition      Subjective Assessment - 08/26/15 1027    Patient is accompained by: Family member  husband   Patient Stated Goals My right leg is weak and making me fall and makes me really tired.     Currently in Pain? No/denies                      OT Treatments/Exercises (OP) - 08/26/15 0001    Neurological Re-education Exercises   Other Exercises 1 Neuro re ed via functional task in kitchen to address activity tolerance, static and dynamic balance, functional ambulation and safety with mobility.  Pt's balane today in standing appear more impaired with increased difficulty controlling RLE, decreased awareness of where RLE is in space, decreased awarenes of safety.  Pt required more assistance with balance today and motor planning deficits appeared more pronounced.  Pt was able to tolerate approximately 25 minutes of activity before needing a rest.  Pt with poor ability  to monitor when balance is worsening due to fatigue.                      OT Long Term Goals - 08/26/15 1217    OT LONG TERM GOAL #1   Title Pt and husband will be mod I with home program prn - 09/05/2015 (datea adjusted as pt missed 2 weeks of therapy from eval date)   Status On-going   OT LONG TERM GOAL #2   Title Pt will be supervision to unload dishwasher    Status Achieved   OT LONG TERM GOAL #3   Title Pt will be superivsion for toilet transfers with AE prn   Status On-going   OT LONG TERM GOAL #4   Title Pt and husband will verbalize understanding of safety recommendations as pt progresses through treatment/disease   Status Achieved   OT LONG TERM GOAL #5   Title Pt will be  supevision for simple sandwich/snack prep   Status On-going   OT LONG TERM GOAL #6   Title Pt will be alble to tolerate 15 minutes of activity at ambuatory level without rest breaks.    Status Achieved               Plan - 08/26/15 1217    Clinical Impression Statement Pt with increased balance disturbance and increased difficulty with functional mobility. Pt showing poor awareness of when balance is worse.  Pt also with increased apraxia as well as inattention to R visual fields.    Pt will benefit from skilled therapeutic intervention in order to improve on the following deficits (Retired) Decreased activity tolerance;Decreased balance;Decreased cognition;Decreased coordination;Decreased safety awareness;Decreased mobility;Decreased knowledge of use of DME;Decreased strength;Impaired UE functional use;Impaired sensation   Rehab Potential Fair   Clinical Impairments Affecting Rehab Potential cognitive impairment, current response to chemo   OT Frequency 2x / week   OT Duration 4 weeks   OT Treatment/Interventions [place]-care/ADL training;Therapeutic exercise;Neuromuscular education;DME and/or AE instruction;Therapist, nutritional;Therapeutic activities;Cognitive remediation/compensation;Patient/family education;Balance training   Plan Functional mobility, activity tolerance, balance, safety   Consulted and Agree with Plan of Care Patient;Family member/caregiver   Family Member Consulted husband, PAT        Problem List Patient Active Problem List   Diagnosis Date Noted  . Abnormality of gait 07/24/2015  . Right hemiparesis ([place]) 07/24/2015  . Thyroid disease 05/01/2015  . Monilial vaginitis 10/23/2014  . Simple partial seizure, consciousness not impaired ([place]) 09/11/2014  . Deep vein thrombosis ([place]) 08/12/2014  . Acute embolism and thrombosis of deep vein of lower extremity ([place]) 08/12/2014  . History of inferior vena caval filter placement 07/14/2014  . Status post  insertion of inferior vena caval filter 07/14/2014  . Malignant glioma of brain ([place]) 06/28/2014  . Diabetes mellitus, type 2 ([place]) 06/27/2014  . 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 Tremor of both hands 04/05/2014  . Vertigo 04/05/2014  . BPPV (benign paroxysmal positional vertigo) 03/14/2014  . Seizures ([place]) 12/16/2013  . Epigastric pain 11/01/2013  . Fever blister 10/16/2013  . Hypothyroidism 12/13/2012  . Other and unspecified hyperlipidemia 12/13/2012  . Anxiety state, unspecified 12/13/2012  . Edema 12/13/2012  . Anxiety state 12/13/2012  . Adult hypothyroidism 12/13/2012  . Urinary incontinence   . DEPRESSION 09/24/2007  . Essential hypertension 09/24/2007  . GERD 09/24/2007  . HIATAL HERNIA 09/24/2007  . 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 Headache(784.0) 09/24/2007    Quay Burow, OTR/L 08/26/2015, 12:21 PM  [facility] [address] [place], [place], [address] Phone: [phone]   Fax:  [phone]  Name: BRYNN GAIR MRN: [id number] Date of Birth: [birth year]

## 2015-08-26 NOTE — Patient Instructions (Signed)
Use these boards we practiced with today, when you find verbal communication difficult.

## 2015-08-26 NOTE — Therapy (Signed)
[facility] [address] [place], [place], [address] Phone: [phone]   Fax:  [phone]  Speech Language Pathology Treatment  Patient Details  Name: Kelly Fletcher MRN: [id number] Date of Birth: 1951/05/09 Referring Provider: Edwin Cap, FNP  Encounter Date: 08/26/2015      End of Session - 08/26/15 1719    Visit Number 7   Number of Visits 17   Date for SLP Re-Evaluation 09/20/15   SLP Start Time 1148   SLP Stop Time  1230   SLP Time Calculation (min) 42 min   Activity Tolerance Patient tolerated treatment well      Past Medical History  Diagnosis Date  . Uterine prolapse   . Cystocele   . GERD (gastroesophageal reflux disease)   . Elevated cholesterol   . Rectocele   . Hypertension   . Arthritis   . Type II or unspecified type diabetes mellitus without mention of complication, uncontrolled     Type 2  . Urinary incontinence     Urgency  . Anxiety state, unspecified   . Palpitations   . Allergic rhinitis, cause unspecified   . Other and unspecified hyperlipidemia   . Unspecified hypothyroidism   . Edema   . Insomnia, unspecified   . Depressive disorder, not elsewhere classified   . Unspecified urinary incontinence   . Peptic ulcer, unspecified site, unspecified as acute or chronic, without mention of hemorrhage, perforation, or obstruction   . Disturbance of skin sensation   . Calculus of kidney   . Facial spasm 04/05/2014  . Abnormal brain MRI 04/13/2014  . 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  Right face   . Monilial vaginitis 10/23/2014    Past Surgical History  Procedure Laterality Date  . Tonsillectomy and adenoidectomy  1963  . Dilation and curettage of uterus      3-05  . Hysteroscopy      3-05  . Endometrial biopsy      Dr.Gottsegen   . Tongue biopsy      There were no vitals filed for this visit.  Visit  Diagnosis: Verbal apraxia  Expressive aphasia      Subjective Assessment - 08/26/15 1322    Subjective Pt's PT and OT both tell SLP pt performed with more difficulty during sessions today.               ADULT SLP TREATMENT - 08/26/15 1328    General Information   Behavior/Cognition Alert;Cooperative   Treatment Provided   Treatment provided Cognitive-Linquistic   Pain Assessment   Pain Assessment 0-10   Pain Score 2    Pain Location rt hip/leg   Pain Descriptors / Indicators Aching   Pain Intervention(s) Monitored during session   Cognitive-Linquistic Treatment   Treatment focused on Apraxia;Aphasia   Skilled Treatment Pt with further reduction in verbal expression this session, verbalized 1-2 words and then unable to cont verbally, 80% of the time. Other 20% was rote/automatized speech, or 3-4 words and then stoppage of verbal output. Given this, SLP educated pt reason for use of augmentative communication means. SLP faciliated practice with this means by working with pt today with communcation boards for responses to hypothetical situations provided verbally by SLP. Pt responded with 88% success to point to an appropriate picture for scanario SLP set up. SLP educated pt's husband reasoning for using this means of communication.   Assessment / Recommendations /  Plan   Plan Continue with current plan of care   Progression Toward Goals   Progression toward goals Not progressing toward goals (comment)  pt's spontaneous speech only 1-2 words then ceases          SLP Education - 08/26/15 1719    Education provided Yes   Education Details augmentative communication boards   Person(s) Educated Patient;Spouse   Methods Explanation;Demonstration;Handout;Verbal cues   Comprehension Verbalized understanding;Returned demonstration;Verbal cues required          SLP Short Term Goals - 08/26/15 1721    SLP SHORT TERM GOAL #1   Title pt will complete HEP for motor speech  planning with slow rate 90% success with rare verbal cues   Time 2   Period Weeks   Status Deferred  pt decline in medical condition   SLP SHORT TERM GOAL #2   Title pt will produce 3-4 word sentences with occasional min-mod A   Time 2   Period Weeks   Status Revised   SLP SHORT TERM GOAL #3   Title pt will demo simplified language in sentence responses 75% of the time   Time 2   Period Weeks   Status On-going   SLP SHORT TERM GOAL #4   Status On-going          SLP Long Term Goals - 08/26/15 [place] #1   Title pt will complete HEP for motor speech planning with slow rate 95% success   Time 6   Period Weeks   Status Deferred  due to medical decline   SLP LONG TERM GOAL #2   Title pt will complete 8 minutes of mod complex conversation using simplified language and other compensations (slow rate, etc) PRN   Time 5   Period Weeks   Status Deferred  due to decline medically   SLP LONG TERM GOAL #3   Title pt will demo knowledge of the need to simplify her verbal expression by using shortened messages to communicate, with rare min A to do so   Time 5   Period Weeks   Status On-going   SLP LONG TERM GOAL #4   Status On-going          Plan - 08/26/15 1719    Clinical Impression Statement Pt cont'd decline in verbal skills in today's session. SLP worked with pt with Lobbyist (boards). Educated husband on how to use/why to use. Cont skilled ST to improve verbal expression is still warranted.   Speech Therapy Frequency 2x / week   Duration --  5 weeks   Treatment/Interventions SLP instruction and feedback;Compensatory strategies;Patient/family education;Cueing hierarchy   Potential to Achieve Goals Fair   Potential Considerations Severity of impairments;Co-morbidities;Previous level of function        Problem List Patient Active Problem List   Diagnosis Date Noted  . Abnormality of gait 07/24/2015  . Right hemiparesis  ([place]) 07/24/2015  . Thyroid disease 05/01/2015  . Monilial vaginitis 10/23/2014  . Simple partial seizure, consciousness not impaired ([place]) 09/11/2014  . Deep vein thrombosis ([place]) 08/12/2014  . Acute embolism and thrombosis of deep vein of lower extremity ([place]) 08/12/2014  . History of inferior vena caval filter placement 07/14/2014  . Status post insertion of inferior vena caval filter 07/14/2014  . Malignant glioma of brain ([place]) 06/28/2014  . Diabetes mellitus, type 2 ([place]) 06/27/2014  . 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 Tremor of both hands  04/05/2014  . Vertigo 04/05/2014  . BPPV (benign paroxysmal positional vertigo) 03/14/2014  . Seizures ([place]) 12/16/2013  . Epigastric pain 11/01/2013  . Fever blister 10/16/2013  . Hypothyroidism 12/13/2012  . Other and unspecified hyperlipidemia 12/13/2012  . Anxiety state, unspecified 12/13/2012  . Edema 12/13/2012  . Anxiety state 12/13/2012  . Adult hypothyroidism 12/13/2012  . Urinary incontinence   . DEPRESSION 09/24/2007  . Essential hypertension 09/24/2007  . GERD 09/24/2007  . HIATAL HERNIA 09/24/2007  . 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 Headache(784.0) 09/24/2007    Theresa Dohrman ,MS, [place]  08/26/2015, 5:25 PM  [facility] [address] [place], [place], [address] Phone: [phone]   Fax:  [phone]   Name: Kelly Fletcher MRN: [id number] Date of Birth: 1951/01/06

## 2015-08-27 ENCOUNTER — Ambulatory Visit: Payer: Medicare Other | Admitting: Physical Therapy

## 2015-08-27 ENCOUNTER — Ambulatory Visit: Payer: Medicare Other | Admitting: Occupational Therapy

## 2015-08-27 NOTE — Therapy (Signed)
[facility] [address] [place], [place], [address] Phone: [phone]   Fax:  [phone]  Physical Therapy Treatment  Patient Details  Name: Kelly Fletcher MRN: [id number] Date of Birth: 10/23/1950 Referring Provider: Ladoris Gene, MD  Encounter Date: 08/26/2015      PT End of Session - 08/26/15 1100    Visit Number 7   Number of Visits 17   Date for PT Re-Evaluation 09/20/15   Authorization Type Medicare G-Code & Progress Note every 10th visit   PT Start Time 1102   PT Stop Time 1145   PT Time Calculation (min) 43 min   Equipment Utilized During Treatment Gait belt   Activity Tolerance Patient tolerated treatment well   Behavior During Therapy [facility] for tasks assessed/performed      Past Medical History  Diagnosis Date  . Uterine prolapse   . Cystocele   . GERD (gastroesophageal reflux disease)   . Elevated cholesterol   . Rectocele   . Hypertension   . Arthritis   . Type II or unspecified type diabetes mellitus without mention of complication, uncontrolled     Type 2  . Urinary incontinence     Urgency  . Anxiety state, unspecified   . Palpitations   . Allergic rhinitis, cause unspecified   . Other and unspecified hyperlipidemia   . Unspecified hypothyroidism   . Edema   . Insomnia, unspecified   . Depressive disorder, not elsewhere classified   . Unspecified urinary incontinence   . Peptic ulcer, unspecified site, unspecified as acute or chronic, without mention of hemorrhage, perforation, or obstruction   . Disturbance of skin sensation   . Calculus of kidney   . Facial spasm 04/05/2014  . Abnormal brain MRI 04/13/2014  . 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  Right face   . Monilial vaginitis 10/23/2014    Past Surgical History  Procedure Laterality Date  . Tonsillectomy and adenoidectomy  1963  . Dilation and curettage of  uterus      3-05  . Hysteroscopy      3-05  . Endometrial biopsy      Dr.Gottsegen   . Tongue biopsy      There were no vitals filed for this visit.  Visit Diagnosis:  Impaired functional mobility and activity tolerance  Generalized weakness  Balance problems  Impaired mobility and ADLs  Abnormality of gait      Subjective Assessment - 08/26/15 1103    Subjective No falls or issues. Appt at [facility] is Wednesday, 08/28/2015, for labs, MD check & Chemo. Next scan is 09/11/15.   Patient is accompained by: Family member   Pertinent History Hypothyroidism, DM2, HTN, depression, DVT   Limitations Standing;Walking;House hold activities   Patient Stated Goals Walk and talk better.    Currently in Pain? No/denies     Orthotic Training: PT donned Allard Toe-Off AFO on RLE and patient's custom foot orthotics designed for arch support & stabilizing her calcaneous. Patient ambulated 140' X 2 with AFO only with constant minA and modA for balance losses especially turning to negotiate around furniture.  Patient ambulated 150' X 2 with RW & AFO with constant min guard and minA / PT holding RW for stability with balance losses to enable increased time to process movements with balance losses. PT instructed pt & husband in recommendations & rationale. PT provided with husband with prescriptions to be signed  while seeing MD at [facility] in 2 days.                               PT Short Term Goals - 08/13/15 1100    PT SHORT TERM GOAL #1   Title Patient demonstrates initial HEP correctly with husband cueing.  (Target Date: 08/16/2015)   Time 4   Period Weeks   Status Achieved   PT SHORT TERM GOAL #2   Title Timed Up-GO <17sec without device with supervision.   (Target Date: 08/16/2015)   Baseline 19.75 on 08/12/15   Status Not Met   PT SHORT TERM GOAL #3   Title Berg Balance >25/56   (Target Date: 08/16/2015)   Baseline MET 08/13/2015 [name] Balance 28/56   Time 4   Period  Weeks   Status Achieved   PT SHORT TERM GOAL #4   Title Ambulates 500' without device with supervision.   (Target Date: 08/16/2015)   Status Not Met   PT SHORT TERM GOAL #5   Title Patient is able to turn head to scan environment while ambulating and maintain her path with supervision.   (Target Date: 08/16/2015)   Time 4   Period Weeks   Status Not Met           PT Long Term Goals - 08/26/15 1145    PT LONG TERM GOAL #1   Title Patient is independent with ongoing HEP / fitness plan. (Target Date: 09/20/2015)   Time 8   Period Weeks   Status On-going   PT LONG TERM GOAL #2   Title Berg Balance >45/56 to indicate lower fall risk.  (Target Date: 09/20/2015)   Time 8   Period Weeks   Status On-going   PT LONG TERM GOAL #3   Title Timed Up & Go <15.5 sec safely  (Target Date: 09/20/2015)   Baseline 08/25/2105 deferred this LTG to work on gait with devices to improve safety   Time 8   Period Weeks   Status Deferred   PT LONG TERM GOAL #4   Title Functional Gait Assessment > 15/30 to indicate lower fall risk.  (Target Date: 09/20/2015)   Baseline 08/25/2105 deferred this LTG to work on gait with devices to improve safety   Time 8   Period Weeks   Status Deferred   PT LONG TERM GOAL #5   Title ambulates 200' with LRAD including ramps, curbs, stairs (1 rail) with husband's assist safely.  (Target Date: 09/20/2015)   Time 8   Period Weeks   Status Revised   Additional Long Term Goals   Additional Long Term Goals Yes   PT LONG TERM GOAL #6   Title Husband & patient verbalize understanding of recommendations for AFO & assistive devices to improve safety. (Target Date: 09/20/2015)   Status New               Plan - 08/26/15 1145    Clinical Impression Statement Patient had increased weakness & impaired coordination with right LE & trunk. RW enabled patient to balance when difficulty advancing right LE. The toe--off AFO appears to give greatest assistance & control for foot  clearance & stance control. Patient would benefit from AFO & RW for home so PT sent Rx with husband for pt's appt at [facility] in 2 days.    Pt will benefit from skilled therapeutic intervention in order to improve on the following deficits Abnormal  gait;Decreased activity tolerance;Decreased balance;Decreased cognition;Decreased coordination;Decreased endurance;Decreased mobility;Decreased strength;Pain   Rehab Potential Good   PT Frequency 2x / week   PT Duration 8 weeks   PT Treatment/Interventions ADLs/[facility];Gait training;Stair training;Functional mobility training;Therapeutic activities;Therapeutic exercise;Balance training;Neuromuscular re-education;Patient/family education;Vestibular   PT Next Visit Plan Gait & balance with AFO & RW with Allard Toe-Off   PT Home Exercise Plan Added Sit to Stand, Corner exercises with feet together head movements (eyes open and closed), tandem stance, single leg stance.   Consulted and Agree with Plan of Care Patient;Family member/caregiver   Family Member Consulted husband        Problem List Patient Active Problem List   Diagnosis Date Noted  . Abnormality of gait 07/24/2015  . Right hemiparesis ([place]) 07/24/2015  . Thyroid disease 05/01/2015  . Monilial vaginitis 10/23/2014  . Simple partial seizure, consciousness not impaired ([place]) 09/11/2014  . Deep vein thrombosis ([place]) 08/12/2014  . Acute embolism and thrombosis of deep vein of lower extremity ([place]) 08/12/2014  . History of inferior vena caval filter placement 07/14/2014  . Status post insertion of inferior vena caval filter 07/14/2014  . Malignant glioma of brain ([place]) 06/28/2014  . Diabetes mellitus, type 2 ([place]) 06/27/2014  . 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 Tremor of both hands 04/05/2014  . Vertigo 04/05/2014  . BPPV (benign paroxysmal positional vertigo) 03/14/2014  . Seizures ([place]) 12/16/2013  . Epigastric pain 11/01/2013  . Fever blister 10/16/2013   . Hypothyroidism 12/13/2012  . Other and unspecified hyperlipidemia 12/13/2012  . Anxiety state, unspecified 12/13/2012  . Edema 12/13/2012  . Anxiety state 12/13/2012  . Adult hypothyroidism 12/13/2012  . Urinary incontinence   . DEPRESSION 09/24/2007  . Essential hypertension 09/24/2007  . GERD 09/24/2007  . HIATAL HERNIA 09/24/2007  . 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 FUXNATFT(732.2) 09/24/2007    Cindi Ghazarian PT, DPT 08/27/2015, 7:30 AM  [facility] [address] [place], [place], [address] Phone: [phone]   Fax:  [phone]  Name: SHANIECE BUSSA MRN: [id number] Date of Birth: September 03, 1950

## 2015-08-28 ENCOUNTER — Other Ambulatory Visit: Payer: Self-pay | Admitting: Internal Medicine

## 2015-09-02 ENCOUNTER — Ambulatory Visit: Payer: Medicare Other | Attending: Nurse Practitioner

## 2015-09-02 ENCOUNTER — Encounter: Payer: Self-pay | Admitting: Occupational Therapy

## 2015-09-02 ENCOUNTER — Ambulatory Visit: Payer: Medicare Other | Admitting: Occupational Therapy

## 2015-09-02 ENCOUNTER — Ambulatory Visit: Payer: Medicare Other | Admitting: Physical Therapy

## 2015-09-02 ENCOUNTER — Encounter: Payer: Self-pay | Admitting: Physical Therapy

## 2015-09-02 DIAGNOSIS — R2689 Other abnormalities of gait and mobility: Secondary | ICD-10-CM | POA: Insufficient documentation

## 2015-09-02 DIAGNOSIS — M6281 Muscle weakness (generalized): Secondary | ICD-10-CM

## 2015-09-02 DIAGNOSIS — R4701 Aphasia: Secondary | ICD-10-CM | POA: Diagnosis present

## 2015-09-02 DIAGNOSIS — R482 Apraxia: Secondary | ICD-10-CM

## 2015-09-02 DIAGNOSIS — R2681 Unsteadiness on feet: Secondary | ICD-10-CM

## 2015-09-02 DIAGNOSIS — R4189 Other symptoms and signs involving cognitive functions and awareness: Secondary | ICD-10-CM | POA: Diagnosis not present

## 2015-09-02 DIAGNOSIS — R29898 Other symptoms and signs involving the musculoskeletal system: Secondary | ICD-10-CM

## 2015-09-02 DIAGNOSIS — Z7409 Other reduced mobility: Secondary | ICD-10-CM | POA: Diagnosis not present

## 2015-09-02 DIAGNOSIS — R41844 Frontal lobe and executive function deficit: Secondary | ICD-10-CM | POA: Diagnosis present

## 2015-09-02 DIAGNOSIS — G8194 Hemiplegia, unspecified affecting left nondominant side: Secondary | ICD-10-CM

## 2015-09-02 DIAGNOSIS — R6889 Other general symptoms and signs: Secondary | ICD-10-CM | POA: Diagnosis not present

## 2015-09-02 DIAGNOSIS — R29818 Other symptoms and signs involving the nervous system: Secondary | ICD-10-CM | POA: Diagnosis not present

## 2015-09-02 DIAGNOSIS — G8191 Hemiplegia, unspecified affecting right dominant side: Secondary | ICD-10-CM | POA: Insufficient documentation

## 2015-09-02 DIAGNOSIS — R531 Weakness: Secondary | ICD-10-CM | POA: Insufficient documentation

## 2015-09-02 NOTE — Therapy (Signed)
[facility] [address] [place], [place], [address] Phone: [phone]   Fax:  [phone]  Speech Language Pathology Treatment  Patient Details  Name: Kelly Fletcher MRN: [id number] Date of Birth: [birth year] Referring Provider: Edwin Cap, FNP  Encounter Date: 09/02/2015      End of Session - 09/02/15 1146    Visit Number 8   Number of Visits 17   Date for SLP Re-Evaluation 09/20/15   SLP Start Time 28   SLP Stop Time  1146   SLP Time Calculation (min) 40 min   Activity Tolerance Patient tolerated treatment well      Past Medical History  Diagnosis Date  . Uterine prolapse   . Cystocele   . GERD (gastroesophageal reflux disease)   . Elevated cholesterol   . Rectocele   . Hypertension   . Arthritis   . Type II or unspecified type diabetes mellitus without mention of complication, uncontrolled     Type 2  . Urinary incontinence     Urgency  . Anxiety state, unspecified   . Palpitations   . Allergic rhinitis, cause unspecified   . Other and unspecified hyperlipidemia   . Unspecified hypothyroidism   . Edema   . Insomnia, unspecified   . Depressive disorder, not elsewhere classified   . Unspecified urinary incontinence   . Peptic ulcer, unspecified site, unspecified as acute or chronic, without mention of hemorrhage, perforation, or obstruction   . Disturbance of skin sensation   . Calculus of kidney   . Facial spasm 04/05/2014  . Abnormal brain MRI 04/13/2014  . 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  Right face   . Monilial vaginitis 10/23/2014    Past Surgical History  Procedure Laterality Date  . Tonsillectomy and adenoidectomy  1963  . Dilation and curettage of uterus      3-05  . Hysteroscopy      3-05  . Endometrial biopsy      Dr.Gottsegen   . Tongue biopsy      There were no vitals filed for this visit.  Visit  Diagnosis: Verbal apraxia  Expressive aphasia      Subjective Assessment - 09/02/15 1112    Subjective Pt husband reports PT putting pt on hold until brace arrives and would like to put ST on hold as well. SLP agreed.               ADULT SLP TREATMENT - 09/02/15 1113    General Information   Behavior/Cognition Alert;Cooperative;Agitated  teary during therapy x1   Treatment Provided   Treatment provided Cognitive-Linquistic   Pain Assessment   Pain Assessment No/denies pain   Cognitive-Linquistic Treatment   Treatment focused on Apraxia;Aphasia   Skilled Treatment Pt reports she has used communication papers/boards provided by SLP last session. Pt verbalizing average 2 initial words in her utternaces and then unable to verbalize more of her message. Given this, SLP worked with pt on confrontation naming (7/12; incr'd to 10/12 with mod phonemic cues). SLP worked with pt to expand utterances from the name of picture to function with approx 20% success (incr'd to approx 70% with consistent max A).    Assessment / Recommendations / Plan   Plan Continue with current plan of care   Progression Toward Goals   Progression toward goals Not progressing toward goals (comment)  putting pt on hold until later this month  SLP Short Term Goals - 09/02/15 1151    SLP SHORT TERM GOAL #1   Title pt will complete HEP for motor speech planning with slow rate 90% success with rare verbal cues   Time 2   Period Weeks   Status Deferred  pt decline in medical condition   SLP SHORT TERM GOAL #2   Title pt will produce 3-4 word sentences with occasional min-mod A   Time 2   Period Weeks   Status Not Met   SLP SHORT TERM GOAL #3   Title pt will demo simplified language in sentence responses 75% of the time   Time 2   Period Weeks   Status Not Met   SLP SHORT TERM GOAL #4   Status Not Met          SLP Long Term Goals - 09/02/15 1152    SLP LONG TERM GOAL #1   Title pt  will complete HEP for motor speech planning with slow rate 95% success   Time 4   Period Weeks   Status Deferred  due to medical decline   SLP LONG TERM GOAL #2   Title pt will complete 8 minutes of mod complex conversation using simplified language and other compensations (slow rate, etc) PRN   Time 4   Period Weeks   Status Deferred  due to decline medically   SLP LONG TERM GOAL #3   Title pt will demo knowledge of the need to simplify her verbal expression by using shortened messages to communicate, with rare min A to do so   Time 4   Period Weeks   Status On-going   SLP LONG TERM GOAL #4   Status On-going          Plan - 09/02/15 1147    Clinical Impression Statement Pt with further decline in verbal skills in today's session. If pt cont decline next session/s, d/c likely.   Speech Therapy Frequency 2x / week   Duration 4 weeks   Treatment/Interventions SLP instruction and feedback;Compensatory strategies;Patient/family education;Cueing hierarchy   Potential to Achieve Goals Fair   Potential Considerations Severity of impairments;Co-morbidities;Previous level of function   Consulted and Agree with Plan of Care Family member/caregiver;Patient        Problem List Patient Active Problem List   Diagnosis Date Noted  . Abnormality of gait 07/24/2015  . Right hemiparesis ([place]) 07/24/2015  . Thyroid disease 05/01/2015  . Monilial vaginitis 10/23/2014  . Simple partial seizure, consciousness not impaired ([place]) 09/11/2014  . Deep vein thrombosis ([place]) 08/12/2014  . Acute embolism and thrombosis of deep vein of lower extremity ([place]) 08/12/2014  . History of inferior vena caval filter placement 07/14/2014  . Status post insertion of inferior vena caval filter 07/14/2014  . Malignant glioma of brain ([place]) 06/28/2014  . Diabetes mellitus, type 2 ([place]) 06/27/2014  . 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 Tremor of both hands 04/05/2014  . Vertigo 04/05/2014  .  BPPV (benign paroxysmal positional vertigo) 03/14/2014  . Seizures ([place]) 12/16/2013  . Epigastric pain 11/01/2013  . Fever blister 10/16/2013  . Hypothyroidism 12/13/2012  . Other and unspecified hyperlipidemia 12/13/2012  . Anxiety state, unspecified 12/13/2012  . Edema 12/13/2012  . Anxiety state 12/13/2012  . Adult hypothyroidism 12/13/2012  . Urinary incontinence   . DEPRESSION 09/24/2007  . Essential hypertension 09/24/2007  . GERD 09/24/2007  . HIATAL HERNIA 09/24/2007  . 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 Headache(784.0) 09/24/2007    Bracy Pepper ,  MS, CCC-SLP  09/02/2015, 11:53 AM  [facility] [address] [place], [place], [address] Phone: [phone]   Fax:  [phone]   Name: Kelly Fletcher MRN: [id number] Date of Birth: 11-Nov-1950

## 2015-09-02 NOTE — Therapy (Signed)
[facility] [address] [place], [place], [address] Phone: [phone]   Fax:  [phone]  Occupational Therapy Treatment  Patient Details  Name: Kelly Fletcher MRN: [id number] Date of Birth: 1950-09-07 Referring Provider: Yvonna Alanis, NFP  Encounter Date: 09/02/2015      OT End of Session - 09/02/15 1249    Visit Number 7   Number of Visits 8   Date for OT Re-Evaluation 09/05/15   Authorization Type medicare will need G code and PN every 10th visit   Authorization Time Period 60 days   Authorization - Visit Number 7   Authorization - Number of Visits 10   OT Start Time 2409  pt in bathroom   OT Stop Time 1240   OT Time Calculation (min) 48 min      Past Medical History  Diagnosis Date  . Uterine prolapse   . Cystocele   . GERD (gastroesophageal reflux disease)   . Elevated cholesterol   . Rectocele   . Hypertension   . Arthritis   . Type II or unspecified type diabetes mellitus without mention of complication, uncontrolled     Type 2  . Urinary incontinence     Urgency  . Anxiety state, unspecified   . Palpitations   . Allergic rhinitis, cause unspecified   . Other and unspecified hyperlipidemia   . Unspecified hypothyroidism   . Edema   . Insomnia, unspecified   . Depressive disorder, not elsewhere classified   . Unspecified urinary incontinence   . Peptic ulcer, unspecified site, unspecified as acute or chronic, without mention of hemorrhage, perforation, or obstruction   . Disturbance of skin sensation   . Calculus of kidney   . Facial spasm 04/05/2014  . Abnormal brain MRI 04/13/2014  . 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  Right face   . Monilial vaginitis 10/23/2014    Past Surgical History  Procedure Laterality Date  . Tonsillectomy and adenoidectomy  1963  . Dilation and curettage of uterus      3-05  . Hysteroscopy     3-05  . Endometrial biopsy      Dr.Gottsegen   . Tongue biopsy      There were no vitals filed for this visit.  Visit Diagnosis:  Impaired functional mobility and activity tolerance  Generalized weakness  Impaired mobility and ADLs  Hemiplegia affecting right dominant side (HCC)  Impaired cognition  Decreased functional activity tolerance      Subjective Assessment - 09/02/15 1153    Subjective  Pt very tearful today but not able to verbalize why.     Patient is accompained by: Family member  husband   Pertinent History see epic   Patient Stated Goals My right leg is weak and making me fall and makes me really tired.     Currently in Pain? No/denies                      OT Treatments/Exercises (OP) - 09/02/15 0001    ADLs   Functional Mobility Addressed functional mobility and activity tolerance with pt and husband. Pt requiring increased assistance today with functional ambulation, sit to stand, stand to sit and turns. Apraxia more prounounced as is R inattention to space and body, decresaed use of RUE, Pt also with greater difficuty following simple directions and attending. Had pt's husband practice with patient and modifying cues,  where he stood and hand placement to assist depending on activity.  Pt very tired today and tearful. Pt and husband have decided that they will discharge today instead of next visit. Pt to have scans at [facility] on 09/11/2015.  Husband will follow up for additional OT if and when indicated.                      OT Long Term Goals - Sep 27, 2015 1247    OT LONG TERM GOAL #1   Title Pt and husband will be mod I with home program prn - 09/05/2015 (datea adjusted as pt missed 2 weeks of therapy from eval date)   Status Achieved   OT LONG TERM GOAL #2   Title Pt will be supervision to unload dishwasher    Status Achieved   OT LONG TERM GOAL #3   Title Pt will be superivsion for toilet transfers with AE prn   Status Not Met   OT  LONG TERM GOAL #4   Title Pt and husband will verbalize understanding of safety recommendations as pt progresses through treatment/disease   Status Achieved   OT LONG TERM GOAL #5   Title Pt will be supevision for simple sandwich/snack prep   Status Not Met   OT LONG TERM GOAL #6   Title Pt will be alble to tolerate 15 minutes of activity at ambuatory level without rest breaks.    Status Achieved               Plan - 09/27/15 1248    Clinical Impression Statement Pt only met part of her LTG's due to a decline during the past 4 weeks. Pt to d./c from OT today and pt and husband in agreement with plan.    Pt will benefit from skilled therapeutic intervention in order to improve on the following deficits (Retired) Decreased activity tolerance;Decreased balance;Decreased cognition;Decreased coordination;Decreased safety awareness;Decreased mobility;Decreased knowledge of use of DME;Decreased strength;Impaired UE functional use;Impaired sensation   Rehab Potential Fair   Clinical Impairments Affecting Rehab Potential cognitive impairment, current response to chemo   OT Frequency 2x / week   OT Duration 4 weeks   OT Treatment/Interventions [place]-care/ADL training;Therapeutic exercise;Neuromuscular education;DME and/or AE instruction;Therapist, nutritional;Therapeutic activities;Cognitive remediation/compensation;Patient/family education;Balance training   Plan d/c from OT   Consulted and Agree with Plan of Care Patient;Family member/caregiver   Family Member Consulted husband, PAT          G-Codes - September 27, 2015 1250    Functional Assessment Tool Used skilled clinical observation   Functional Limitation [place] care   [place] Care Current Status [phone]) At least 60 percent but less than 80 percent impaired, limited or restricted   [place] Care Goal Status ([id number]) At least 40 percent but less than 60 percent impaired, limited or restricted   [place] Care Discharge Status [phone]) At least 60  percent but less than 80 percent impaired, limited or restricted      Problem List Patient Active Problem List   Diagnosis Date Noted  . Abnormality of gait 07/24/2015  . Right hemiparesis ([place]) 07/24/2015  . Thyroid disease 05/01/2015  . Monilial vaginitis 10/23/2014  . Simple partial seizure, consciousness not impaired ([place]) 09/11/2014  . Deep vein thrombosis ([place]) 08/12/2014  . Acute embolism and thrombosis of deep vein of lower extremity ([place]) 08/12/2014  . History of inferior vena caval filter placement 07/14/2014  . Status post insertion of inferior vena caval filter 07/14/2014  . Malignant glioma of brain (  [place]) 06/28/2014  . Diabetes mellitus, type 2 ([place]) 06/27/2014  . 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 Tremor of both hands 04/05/2014  . Vertigo 04/05/2014  . BPPV (benign paroxysmal positional vertigo) 03/14/2014  . Seizures ([place]) 12/16/2013  . Epigastric pain 11/01/2013  . Fever blister 10/16/2013  . Hypothyroidism 12/13/2012  . Other and unspecified hyperlipidemia 12/13/2012  . Anxiety state, unspecified 12/13/2012  . Edema 12/13/2012  . Anxiety state 12/13/2012  . Adult hypothyroidism 12/13/2012  . Urinary incontinence   . DEPRESSION 09/24/2007  . Essential hypertension 09/24/2007  . GERD 09/24/2007  . HIATAL HERNIA 09/24/2007  . 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 Headache(784.0) 09/24/2007   OCCUPATIONAL THERAPY DISCHARGE SUMMARY  Visits from Start of Care: 7  Current functional level related to goals / functional outcomes: See above - pt did not meet all goals due to decline over past 4 weeks   Remaining deficits: See initial eval   Education / Equipment: HEP, equipment recommendations Plan: Patient agrees to discharge.  Patient goals were partially met. Patient is being discharged due to meeting the stated rehab goals.  ?????      Quay Burow, OTR/L 09/02/2015, 12:51 PM  [facility] [address] [place], [place], [address] Phone: [phone]   Fax:  [phone]  Name: CHARLIEGH VASUDEVAN MRN: [id number] Date of Birth: [birth year]

## 2015-09-02 NOTE — Therapy (Signed)
[facility] [address] [place], [place], [address] Phone: [phone]   Fax:  [phone]  Physical Therapy Treatment  Patient Details  Name: Kelly Fletcher MRN: [id number] Date of Birth: 05-03-1951 Referring Provider: Ladoris Gene, MD  Encounter Date: 09/02/2015      PT End of Session - 09/02/15 1110    Visit Number 8   Number of Visits 17   Date for PT Re-Evaluation 09/20/15   Authorization Type Medicare G-Code & Progress Note every 10th visit   PT Start Time 1022   PT Stop Time 1105   PT Time Calculation (min) 43 min   Equipment Utilized During Treatment Gait belt   Activity Tolerance Patient tolerated treatment well   Behavior During Therapy [facility] for tasks assessed/performed      Past Medical History  Diagnosis Date  . Uterine prolapse   . Cystocele   . GERD (gastroesophageal reflux disease)   . Elevated cholesterol   . Rectocele   . Hypertension   . Arthritis   . Type II or unspecified type diabetes mellitus without mention of complication, uncontrolled     Type 2  . Urinary incontinence     Urgency  . Anxiety state, unspecified   . Palpitations   . Allergic rhinitis, cause unspecified   . Other and unspecified hyperlipidemia   . Unspecified hypothyroidism   . Edema   . Insomnia, unspecified   . Depressive disorder, not elsewhere classified   . Unspecified urinary incontinence   . Peptic ulcer, unspecified site, unspecified as acute or chronic, without mention of hemorrhage, perforation, or obstruction   . Disturbance of skin sensation   . Calculus of kidney   . Facial spasm 04/05/2014  . Abnormal brain MRI 04/13/2014  . 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  Right face   . Monilial vaginitis 10/23/2014    Past Surgical History  Procedure Laterality Date  . Tonsillectomy and adenoidectomy  1963  . Dilation and curettage of uterus       3-05  . Hysteroscopy      3-05  . Endometrial biopsy      Dr.Gottsegen   . Tongue biopsy      There were no vitals filed for this visit.  Visit Diagnosis:  Unsteadiness  Decreased functional activity tolerance  Right leg weakness  Balance problems  Generalized weakness  Other abnormalities of gait and mobility      Subjective Assessment - 09/02/15 1026    Subjective Had chemo last Wednesday ( 5 days ago) and is still weak. No falls. [facility] signed prescriptions for AFO & RW.    Patient is accompained by: Family member   Pertinent History Hypothyroidism, DM2, HTN, depression, DVT   Limitations Standing;Walking;House hold activities   Patient Stated Goals Walk and talk better.    Currently in Pain? No/denies      Orthotic Training with Allard Toe-Off AFO PT re-laced right shoe to accommodate AFO better. PT donned AFO. PT issued RW and adjusted to patient.  Patient ambulated 140' X 3 with RW & AFO with husband's assist with PT cueing safe assistance, cueing. PT added theraband to RW as visual target to facilitate RLE advancement which appeared to improve right LE swing. Once purchases shoes then leather toe cap should be added.  Brooke Pace, [facility] with [facility] present. Patient & husband agreed to pursuing AFO with plans to deliver in  2 weeks.                            PT Education - 09/02/15 1020    Education provided Yes   Education Details use of RW & AFO   Person(s) Educated Patient;Spouse   Methods Explanation;Verbal cues   Comprehension Verbalized understanding;Returned demonstration;Verbal cues required;Tactile cues required;Need further instruction          PT Short Term Goals - 08/13/15 1100    PT SHORT TERM GOAL #1   Title Patient demonstrates initial HEP correctly with husband cueing.  (Target Date: 08/16/2015)   Time 4   Period Weeks   Status Achieved   PT SHORT TERM GOAL #2   Title Timed Up-GO <17sec without device with  supervision.   (Target Date: 08/16/2015)   Baseline 19.75 on 08/12/15   Status Not Met   PT SHORT TERM GOAL #3   Title Berg Balance >25/56   (Target Date: 08/16/2015)   Baseline MET 08/13/2015 [name] Balance 28/56   Time 4   Period Weeks   Status Achieved   PT SHORT TERM GOAL #4   Title Ambulates 500' without device with supervision.   (Target Date: 08/16/2015)   Status Not Met   PT SHORT TERM GOAL #5   Title Patient is able to turn head to scan environment while ambulating and maintain her path with supervision.   (Target Date: 08/16/2015)   Time 4   Period Weeks   Status Not Met           PT Long Term Goals - 08/26/15 1145    PT LONG TERM GOAL #1   Title Patient is independent with ongoing HEP / fitness plan. (Target Date: 09/20/2015)   Time 8   Period Weeks   Status On-going   PT LONG TERM GOAL #2   Title Berg Balance >45/56 to indicate lower fall risk.  (Target Date: 09/20/2015)   Time 8   Period Weeks   Status On-going   PT LONG TERM GOAL #3   Title Timed Up & Go <15.5 sec safely  (Target Date: 09/20/2015)   Baseline 08/25/2105 deferred this LTG to work on gait with devices to improve safety   Time 8   Period Weeks   Status Deferred   PT LONG TERM GOAL #4   Title Functional Gait Assessment > 15/30 to indicate lower fall risk.  (Target Date: 09/20/2015)   Baseline 08/25/2105 deferred this LTG to work on gait with devices to improve safety   Time 8   Period Weeks   Status Deferred   PT LONG TERM GOAL #5   Title ambulates 200' with LRAD including ramps, curbs, stairs (1 rail) with husband's assist safely.  (Target Date: 09/20/2015)   Time 8   Period Weeks   Status Revised   Additional Long Term Goals   Additional Long Term Goals Yes   PT LONG TERM GOAL #6   Title Husband & patient verbalize understanding of recommendations for AFO & assistive devices to improve safety. (Target Date: 09/20/2015)   Status New               Plan - 09/02/15 1110    Clinical Impression  Statement Patient and husband appear to understand use of RW & recommendation for AFO. Patient would benefit from further PT instruction once she recieves AFO. Husband and pt agree with holding PT for 2 weeks until AFO delivered.  Pt will benefit from skilled therapeutic intervention in order to improve on the following deficits Abnormal gait;Decreased activity tolerance;Decreased balance;Decreased cognition;Decreased coordination;Decreased endurance;Decreased mobility;Decreased strength;Pain   Rehab Potential Good   PT Frequency 2x / week   PT Duration 8 weeks   PT Treatment/Interventions ADLs/[place] [facility];Gait training;Stair training;Functional mobility training;Therapeutic activities;Therapeutic exercise;Balance training;Neuromuscular re-education;Patient/family education;Vestibular   PT Next Visit Plan Hold 2 weeks until recieves AFO, Gait & balance with AFO & RW with Allard Toe-Off   PT Home Exercise Plan Added Sit to Stand, Corner exercises with feet together head movements (eyes open and closed), tandem stance, single leg stance.   Consulted and Agree with Plan of Care Patient;Family member/caregiver   Family Member Consulted husband        Problem List Patient Active Problem List   Diagnosis Date Noted  . Abnormality of gait 07/24/2015  . Right hemiparesis ([place]) 07/24/2015  . Thyroid disease 05/01/2015  . Monilial vaginitis 10/23/2014  . Simple partial seizure, consciousness not impaired ([place]) 09/11/2014  . Deep vein thrombosis ([place]) 08/12/2014  . Acute embolism and thrombosis of deep vein of lower extremity ([place]) 08/12/2014  . History of inferior vena caval filter placement 07/14/2014  . Status post insertion of inferior vena caval filter 07/14/2014  . Malignant glioma of brain ([place]) 06/28/2014  . Diabetes mellitus, type 2 ([place]) 06/27/2014  . 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 Tremor of both hands 04/05/2014  . Vertigo 04/05/2014  . BPPV (benign  paroxysmal positional vertigo) 03/14/2014  . Seizures ([place]) 12/16/2013  . Epigastric pain 11/01/2013  . Fever blister 10/16/2013  . Hypothyroidism 12/13/2012  . Other and unspecified hyperlipidemia 12/13/2012  . Anxiety state, unspecified 12/13/2012  . Edema 12/13/2012  . Anxiety state 12/13/2012  . Adult hypothyroidism 12/13/2012  . Urinary incontinence   . DEPRESSION 09/24/2007  . Essential hypertension 09/24/2007  . GERD 09/24/2007  . HIATAL HERNIA 09/24/2007  . 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 AGTXMIWO(032.1) 09/24/2007    Savva Beamer PT, DPT 09/02/2015, 10:19 PM  [facility] [address] [place], [place], [address] Phone: [phone]   Fax:  [phone]  Name: KEBRINA FRIEND MRN: [id number] Date of Birth: November 17, 1950

## 2015-09-03 ENCOUNTER — Ambulatory Visit: Payer: Medicare Other | Admitting: Physical Therapy

## 2015-09-03 ENCOUNTER — Encounter: Payer: Medicare Other | Admitting: Occupational Therapy

## 2015-09-05 ENCOUNTER — Ambulatory Visit: Payer: Medicare Other | Admitting: Physical Therapy

## 2015-09-05 ENCOUNTER — Encounter: Payer: Medicare Other | Admitting: Occupational Therapy

## 2015-09-05 NOTE — Addendum Note (Signed)
Addended by: Garald Balding B on: 09/05/2015 01:17 PM   Modules accepted: Orders

## 2015-09-09 NOTE — Addendum Note (Signed)
Addended by: Rivka Barbara on: 09/09/2015 08:44 AM   Modules accepted: Orders

## 2015-09-10 ENCOUNTER — Ambulatory Visit: Payer: Medicare Other | Admitting: Physical Therapy

## 2015-09-11 ENCOUNTER — Other Ambulatory Visit: Payer: Self-pay | Admitting: Internal Medicine

## 2015-09-12 ENCOUNTER — Ambulatory Visit: Payer: Medicare Other | Admitting: Physical Therapy

## 2015-09-13 ENCOUNTER — Other Ambulatory Visit: Payer: Self-pay

## 2015-09-13 ENCOUNTER — Telehealth: Payer: Self-pay | Admitting: Nurse Practitioner

## 2015-09-13 ENCOUNTER — Encounter: Payer: Self-pay | Admitting: Nurse Practitioner

## 2015-09-13 ENCOUNTER — Ambulatory Visit (HOSPITAL_BASED_OUTPATIENT_CLINIC_OR_DEPARTMENT_OTHER): Payer: Medicare Other | Admitting: Nurse Practitioner

## 2015-09-13 ENCOUNTER — Telehealth: Payer: Self-pay

## 2015-09-13 VITALS — BP 135/74 | HR 64 | Temp 98.6°F | Resp 18

## 2015-09-13 DIAGNOSIS — L539 Erythematous condition, unspecified: Secondary | ICD-10-CM

## 2015-09-13 DIAGNOSIS — C719 Malignant neoplasm of brain, unspecified: Secondary | ICD-10-CM | POA: Diagnosis not present

## 2015-09-13 DIAGNOSIS — R21 Rash and other nonspecific skin eruption: Secondary | ICD-10-CM | POA: Diagnosis present

## 2015-09-13 DIAGNOSIS — L0390 Cellulitis, unspecified: Secondary | ICD-10-CM | POA: Insufficient documentation

## 2015-09-13 DIAGNOSIS — L03116 Cellulitis of left lower limb: Secondary | ICD-10-CM

## 2015-09-13 MED ORDER — DOXYCYCLINE HYCLATE 100 MG PO TABS: 100.0000 mg | ORAL_TABLET | Freq: Two times a day (BID) | ORAL | Status: DC

## 2015-09-13 NOTE — Assessment & Plan Note (Signed)
Patient's diagnosis glioblastoma is managed per [facility].  She recently underwent restaging brain MRI at [facility] on 09/11/2015 which revealed progression of her disease.  .  Decision was made to discontinue patient's Temodar oral therapy; and to initiate Lomustine oral medication once every 6 weeks.  Patient will also continue to receive Avastin on an every two-week basis.  Patient had labs while she was at [facility] this week as well; and all were essentially within normal limits.    Patient continues with chronic right-sided weakness and some expressive aphasia as her baseline.  Patient has no scheduled follow-up.  The cancer Center; but agrees to call the [facility] on an as-needed basis for any symptom management.  Also, Dr. Benay Spice.  Advised patient he would be glad to assume her cancer care.  If traveling to [facility] becomes too tedious for the patient.

## 2015-09-13 NOTE — Progress Notes (Signed)
SYMPTOM MANAGEMENT CLINIC    Chief Complaint: Rash  HPI:  Kelly Fletcher 65 y.o. female diagnosed with glioblastoma.  Currently managed per [facility] and undergoing Lomustine/Avastin therapy.  Patient has noticed a raised rash to her left lower leg area with some erythema and warmth.  She has also noticed some trace erythema to the left inner ankle as well.  She denies any known injury or trauma to the site.  She also denies any pruritus to the site.  She denies any recent fevers or chills.  Exam today reveals a raised, circular rash to the left lower outer leg; with some surrounding erythema and mild warmth.  There are no red streaks associated to the site.  There is also some mild erythema to the left inner ankle as well.  The site.  Nontender.  Does appear that the left lower outer leg site could quite possibly be an insect/spider bite.  There may question maybe some mild cellulitis to the site as well.  Will prescribe doxycycline for treatment of any possible cellulitis.  Patient was also encouraged to elevate her legs above the level of her heart whenever possible; and to try some warm compresses as well.  Patient was advised to call/return to go directly to the emergency department over the weekend if symptoms persist or worsen.   No history exists.    Review of Systems  Skin: Positive for rash. Negative for itching.  All other systems reviewed and are negative.   Past Medical History  Diagnosis Date  . Uterine prolapse   . Cystocele   . GERD (gastroesophageal reflux disease)   . Elevated cholesterol   . Rectocele   . Hypertension   . Arthritis   . Type II or unspecified type diabetes mellitus without mention of complication, uncontrolled     Type 2  . Urinary incontinence     Urgency  . Anxiety state, unspecified   . Palpitations   . Allergic rhinitis, cause unspecified   . Other and unspecified hyperlipidemia   . Unspecified hypothyroidism   . Edema   .  Insomnia, unspecified   . Depressive disorder, not elsewhere classified   . Unspecified urinary incontinence   . Peptic ulcer, unspecified site, unspecified as acute or chronic, without mention of hemorrhage, perforation, or obstruction   . Disturbance of skin sensation   . Calculus of kidney   . Facial spasm 04/05/2014  . Abnormal brain MRI 04/13/2014  . 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  Right face   . Monilial vaginitis 10/23/2014    Past Surgical History  Procedure Laterality Date  . Tonsillectomy and adenoidectomy  1963  . Dilation and curettage of uterus      3-05  . Hysteroscopy      3-05  . Endometrial biopsy      Dr.Gottsegen   . 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    is allergic to shellfish allergy; ibuprofen; latex; naproxen; nitrofurantoin monohyd macro; paroxetine hcl; prednisone; prozac; and tegretol.    Medication List       This list is accurate as of: 09/13/15  5:31 PM.  Always use your most recent med list.               atorvastatin 10 MG tablet  Commonly known as:  LIPITOR  ONE DAILY  TO CONTROL CHOLESTEROL     b complex vitamins tablet  Take 1 tablet by mouth daily.     cholecalciferol 1000 units tablet  Commonly known as:  VITAMIN D  Take 1,000 Units by mouth daily. Takes 2000-5042m daily     ciprofloxacin 500 MG tablet  Commonly known as:  CIPRO  Take 1 tablet (500 mg total) by mouth 2 (two) times daily.     cloBAZam 10 MG tablet  Commonly known as:  ONFI  Take 10 mg by mouth at bedtime.     clonazePAM 1 MG tablet  Commonly known as:  KLONOPIN  Take 1 mg by mouth as needed. Take at onset of seizure     diclofenac sodium 1 % Gel  Commonly known as:  VOLTAREN  Apply topically.     docusate sodium 100 MG capsule  Commonly known as:  COLACE  Take 100 mg by mouth 2 (two) times daily.     doxycycline 100 MG tablet  Commonly known as:  VIBRA-TABS  Take 1 tablet (100 mg total) by mouth 2 (two) times daily.     escitalopram 10 MG tablet  Commonly known as:  LEXAPRO  Take 10 mg by mouth daily. Pt taking 1/2 tablet daily at night     lacosamide 200 MG Tabs tablet  Commonly known as:  VIMPAT  Take 200 mg by mouth 2 (two) times daily.     Lancets Misc  Use as directed. E11.9     levETIRAcetam 750 MG tablet  Commonly known as:  KEPPRA  TAKE 2 TABLETS BY MOUTH TWICE A DAY     levothyroxine 125 MCG tablet  Commonly known as:  SYNTHROID, LEVOTHROID  TAKE 1 TABLET (125 MCG TOTAL) BY MOUTH DAILY BEFORE BREAKFAST.     lisinopril 10 MG tablet  Commonly known as:  PRINIVIL,ZESTRIL  Take by mouth.     loratadine 10 MG tablet  Commonly known as:  CLARITIN  Take 10 mg by mouth daily as needed for allergies.     metFORMIN 500 MG 24 hr tablet  Commonly known as:  GLUCOPHAGE-XR  TAKE 1 TABLET BY MOUTH TWICE A DAY TO CONTROL BLOOD SUGAR     ondansetron 8 MG tablet  Commonly known as:  ZOFRAN  Take 8 mg by mouth every 8 (eight) hours as needed.     ONE TOUCH ULTRA TEST test strip  Generic drug:  glucose blood  CHECK BLOOD GLUCOSE AS DIRECTED      pantoprazole 40 MG tablet  Commonly known as:  PROTONIX  One daily to reduce stomach acid     predniSONE 10 MG (21) Tbpk tablet  Commonly known as:  STERAPRED UNI-PAK 21 TAB  Take 1 tablet (10 mg total) by mouth daily. Take 6 tabs by mouth daily  for 2 days, then 5 tabs for 2 days, then 4 tabs for 2 days, then 3 tabs for 2 days, 2 tabs for  2 days, then 1 tab by mouth daily for 2 days     pseudoephedrine 30 MG tablet  Commonly known as:  SUDAFED  Take 30 mg by mouth every 4 (four) hours as needed for congestion.     sennosides-docusate sodium 8.6-50 MG tablet  Commonly known as:  SENOKOT-S  Take 1 tablet by mouth 2 (two) times daily.     temozolomide 100 MG capsule  Commonly known as:  TEMODAR  Take 1 capsule (100 mg total) by mouth at bedtime. May take on an empty stomach or at bedtime to decrease nausea & vomiting.     valACYclovir 1000 MG tablet  Commonly known as:  VALTREX  Take 1,000 mg by mouth 2 (two) times daily as needed. To help resolve viral blisters         PHYSICAL EXAMINATION  Oncology Vitals 09/13/2015 08/20/2015  Height - -  Weight (No Data) -  Weight (lbs) (No Data) -  BMI (kg/m2) - -  Temp 98.6 -  Pulse 64 57  Resp 18 -  SpO2 98 -  BSA (m2) - -   BP Readings from Last 2 Encounters:  09/13/15 135/74  08/20/15 151/88    Physical Exam  Constitutional: She is oriented to person, place, and time and well-developed, well-nourished, and in no distress.  HENT:  Head: Normocephalic and atraumatic.  Eyes: Conjunctivae and EOM are normal. Pupils are equal, round, and reactive to light. Right eye exhibits no discharge. Left eye exhibits no discharge. No scleral icterus.  Neck: Normal range of motion.  Pulmonary/Chest: Effort normal. No respiratory distress.  Musculoskeletal: She exhibits no edema or tenderness.  Patient's right side is weaker than the left his baseline.  Neurological: She is alert and oriented to person, place, and time.  Skin: Skin is warm  and dry. Rash noted. There is erythema. No pallor.  Exam today reveals a raised, circular rash to the left lower outer leg; with some surrounding erythema and mild warmth.  There are no red streaks associated to the site.  There is also some mild erythema to the left inner ankle as well.  The site.  Nontender.  Does appear that the left lower outer leg site could quite possibly be an insect/spider bite.  There may question maybe some mild cellulitis to the site as well.    Psychiatric: Affect normal.  Nursing note and vitals reviewed.   LABORATORY DATA:. No visits with results within 3 Day(s) from this visit. Latest known visit with results is:  Admission on 08/02/2015, Discharged on 08/03/2015  Component Date Value Ref Range Status  . WBC 08/02/2015 3.7* 4.0 - 10.5 K/uL Final  . RBC 08/02/2015 3.22* 3.87 - 5.11 MIL/uL Final  . Hemoglobin 08/02/2015 11.2* 12.0 - 15.0 g/dL Final  . HCT 08/02/2015 33.2* 36.0 - 46.0 % Final  . MCV 08/02/2015 103.1* 78.0 - 100.0 fL Final  . MCH 08/02/2015 34.8* 26.0 - 34.0 pg Final  . MCHC 08/02/2015 33.7  30.0 - 36.0 g/dL Final  . RDW 08/02/2015 13.2  11.5 - 15.5 % Final  . Platelets 08/02/2015 128* 150 - 400 K/uL Final  . Neutrophils Relative % 08/02/2015 63   Final  . Neutro Abs 08/02/2015 2.3  1.7 - 7.7 K/uL Final  . Lymphocytes Relative 08/02/2015 28   Final  . Lymphs Abs 08/02/2015 1.0  0.7 - 4.0 K/uL Final  . Monocytes Relative 08/02/2015 6   Final  . Monocytes Absolute 08/02/2015 0.2  0.1 - 1.0  K/uL Final  . Eosinophils Relative 08/02/2015 3   Final  . Eosinophils Absolute 08/02/2015 0.1  0.0 - 0.7 K/uL Final  . Basophils Relative 08/02/2015 0   Final  . Basophils Absolute 08/02/2015 0.0  0.0 - 0.1 K/uL Final  . Sodium 08/02/2015 140  135 - 145 mmol/L Final  . Potassium 08/02/2015 3.8  3.5 - 5.1 mmol/L Final  . Chloride 08/02/2015 104  101 - 111 mmol/L Final  . CO2 08/02/2015 27  22 - 32 mmol/L Final  . Glucose, Bld 08/02/2015 101* 65 - 99  mg/dL Final  . BUN 08/02/2015 15  6 - 20 mg/dL Final  . Creatinine, Ser 08/02/2015 0.57  0.44 - 1.00 mg/dL Final  . Calcium 08/02/2015 9.2  8.9 - 10.3 mg/dL Final  . GFR calc non Af Amer 08/02/2015 >60  >60 mL/min Final  . GFR calc Af Amer 08/02/2015 >60  >60 mL/min Final   Comment: (NOTE) The eGFR has been calculated using the CKD EPI equation. This calculation has not been validated in all clinical situations. eGFR's persistently <60 mL/min signify possible Chronic Kidney Disease.   . Anion gap 08/02/2015 9  5 - 15 Final       RADIOGRAPHIC STUDIES: No results found.  ASSESSMENT/PLAN:    Malignant glioma of brain [facility]) Patient's diagnosis glioblastoma is managed per [facility].  She recently underwent restaging brain MRI at [facility] on 09/11/2015 which revealed progression of her disease.  .  Decision was made to discontinue patient's Temodar oral therapy; and to initiate Lomustine oral medication once every 6 weeks.  Patient will also continue to receive Avastin on an every two-week basis.  Patient had labs while she was at [facility] this week as well; and all were essentially within normal limits.    Patient continues with chronic right-sided weakness and some expressive aphasia as her baseline.  Patient has no scheduled follow-up.  The cancer Center; but agrees to call the [place] on an as-needed basis for any symptom management.  Also, Dr. Benay Spice.  Advised patient he would be glad to assume her cancer care.  If traveling to [facility] becomes too tedious for the patient.    Cellulitis Patient has noticed a raised rash to her left lower leg area with some erythema and warmth.  She has also noticed some trace erythema to the left inner ankle as well.  She denies any known injury or trauma to the site.  She also denies any pruritus to the site.  She denies any recent fevers or chills.  Exam today reveals a raised, circular rash to the left lower outer leg; with some  surrounding erythema and mild warmth.  There are no red streaks associated to the site.  There is also some mild erythema to the left inner ankle as well.  The site.  Nontender.  Does appear that the left lower outer leg site could quite possibly be an insect/spider bite.  There may question maybe some mild cellulitis to the site as well.  Will prescribe doxycycline for treatment of any possible cellulitis.  Patient was also encouraged to elevate her legs above the level of her heart whenever possible; and to try some warm compresses as well.  Patient was advised to call/return to go directly to the emergency department over the weekend if symptoms persist or worsen.   Patient stated understanding of all instructions; and was in agreement with this plan of care. The patient knows to call the clinic  with any problems, questions or concerns.   Total time spent with patient was 25 minutes;  with greater than 75 percent of that time spent in face to face counseling regarding patient's symptoms,  and coordination of care and follow up.  Disclaimer:This dictation was prepared with Dragon/digital dictation along with [employer]. Any transcriptional errors that result from this process are unintentional.  Drue Second, NP 09/13/2015   This was a shared visit with Drue Second.  Ms. Domke was examined.  She will complete a course of antibiotics.  We have a low clinical suspicion for a thrombus.  We will be glad to resume treatment for the GBM here if/when she desires.  Julieanne Manson, MD

## 2015-09-13 NOTE — Assessment & Plan Note (Signed)
Patient has noticed a raised rash to her left lower leg area with some erythema and warmth.  She has also noticed some trace erythema to the left inner ankle as well.  She denies any known injury or trauma to the site.  She also denies any pruritus to the site.  She denies any recent fevers or chills.  Exam today reveals a raised, circular rash to the left lower outer leg; with some surrounding erythema and mild warmth.  There are no red streaks associated to the site.  There is also some mild erythema to the left inner ankle as well.  The site.  Nontender.  Does appear that the left lower outer leg site could quite possibly be an insect/spider bite.  There may question maybe some mild cellulitis to the site as well.  Will prescribe doxycycline for treatment of any possible cellulitis.  Patient was also encouraged to elevate her legs above the level of her heart whenever possible; and to try some warm compresses as well.  Patient was advised to call/return to go directly to the emergency department over the weekend if symptoms persist or worsen.

## 2015-09-13 NOTE — Telephone Encounter (Signed)
Patient's husband called requesting that patient be seen today with possible DVT left lower leg he describes a quarter size warm red area. Writer spoke with Cyndee who ok'd the visit.  Patient to bring most recent lab work with her from [place] that was obtained on 09/11/15.

## 2015-09-13 NOTE — Telephone Encounter (Signed)
spoke w/ husband.. pt aware

## 2015-09-17 ENCOUNTER — Ambulatory Visit: Payer: Medicare Other | Admitting: Physical Therapy

## 2015-09-18 ENCOUNTER — Ambulatory Visit: Payer: Self-pay | Admitting: Physical Therapy

## 2015-09-19 ENCOUNTER — Ambulatory Visit: Payer: Medicare Other | Admitting: Physical Therapy

## 2015-09-21 ENCOUNTER — Emergency Department (HOSPITAL_COMMUNITY)
Admission: EM | Admit: 2015-09-21 | Discharge: 2015-09-22 | Disposition: A | Discharge status: Home / Self Care | Payer: Medicare Other | Attending: Emergency Medicine | Admitting: Emergency Medicine

## 2015-09-21 ENCOUNTER — Encounter (HOSPITAL_COMMUNITY): Payer: Self-pay | Admitting: Nurse Practitioner

## 2015-09-21 DIAGNOSIS — I10 Essential (primary) hypertension: Secondary | ICD-10-CM | POA: Insufficient documentation

## 2015-09-21 DIAGNOSIS — Z79899 Other long term (current) drug therapy: Secondary | ICD-10-CM | POA: Diagnosis not present

## 2015-09-21 DIAGNOSIS — K219 Gastro-esophageal reflux disease without esophagitis: Secondary | ICD-10-CM | POA: Diagnosis not present

## 2015-09-21 DIAGNOSIS — Z792 Long term (current) use of antibiotics: Secondary | ICD-10-CM | POA: Insufficient documentation

## 2015-09-21 DIAGNOSIS — E119 Type 2 diabetes mellitus without complications: Secondary | ICD-10-CM | POA: Diagnosis not present

## 2015-09-21 DIAGNOSIS — E784 Other hyperlipidemia: Secondary | ICD-10-CM | POA: Diagnosis not present

## 2015-09-21 DIAGNOSIS — Z7952 Long term (current) use of systemic steroids: Secondary | ICD-10-CM | POA: Diagnosis not present

## 2015-09-21 DIAGNOSIS — G4700 Insomnia, unspecified: Secondary | ICD-10-CM | POA: Diagnosis not present

## 2015-09-21 DIAGNOSIS — M1990 Unspecified osteoarthritis, unspecified site: Secondary | ICD-10-CM | POA: Diagnosis not present

## 2015-09-21 DIAGNOSIS — Z7984 Long term (current) use of oral hypoglycemic drugs: Secondary | ICD-10-CM | POA: Insufficient documentation

## 2015-09-21 DIAGNOSIS — F329 Major depressive disorder, single episode, unspecified: Secondary | ICD-10-CM | POA: Insufficient documentation

## 2015-09-21 DIAGNOSIS — C719 Malignant neoplasm of brain, unspecified: Secondary | ICD-10-CM | POA: Insufficient documentation

## 2015-09-21 DIAGNOSIS — G40909 Epilepsy, unspecified, not intractable, without status epilepticus: Secondary | ICD-10-CM | POA: Insufficient documentation

## 2015-09-21 DIAGNOSIS — E039 Hypothyroidism, unspecified: Secondary | ICD-10-CM | POA: Diagnosis not present

## 2015-09-21 DIAGNOSIS — G40919 Epilepsy, unspecified, intractable, without status epilepticus: Secondary | ICD-10-CM

## 2015-09-21 MED ORDER — LORAZEPAM 2 MG/ML IJ SOLN
1.0000 mg | Freq: Once | INTRAMUSCULAR | Status: AC
  Administered 2015-09-22: 1 mg via INTRAVENOUS
  Filled 2015-09-21: qty 1

## 2015-09-21 NOTE — ED Notes (Signed)
Per EMS pt began having focal seizures at appx 2000 hx of brain cancer and took Ativan 0.5mg  at 2000 and 2045 by spouse at home then was given Keppra 1500MG , [place] 10mg  and Vempat 200mg  at 2100. PT alert and oriented x 4 with EMS during transport and was given 2.5mg  Valium IV by EMS.

## 2015-09-21 NOTE — ED Provider Notes (Signed)
CSN: [id number]     Arrival date & time 09/21/15  2244 History  By signing my name below, I, Irene Pap, attest that this documentation has been prepared under the direction and in the presence of Veryl Speak, MD. Electronically Signed: Irene Pap, ED Scribe. 09/21/2015. 11:55 PM.  Chief Complaint  Patient presents with  . Seizures  Patient is a 65 y.o. female presenting with seizures. The history is provided by the spouse. No language interpreter was used.  Seizures Seizure activity on arrival: no   Seizure type:  Focal Severity:  Moderate Progression:  Unchanged Context: stress   Recent head injury:  No recent head injuries Meds prior to arrival: Ativan; Keppra; Onfi; Vempat. History of seizures: yes   Similar to previous episodes: no   Current therapy:  Levetiracetam Compliance with current therapy:  Good HPI Comments: Tenley Limbacher Fenstermacher is a 65 y.o. 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 He reports that she began to have a tremor from the top of her right arm to the bottom of her hand with chronic weakness. Husband reports that this tremulous activity is not typical of a seizure for her, but can be indicative of something more severe. He has given her 1 mg Ativan, 1500 mg Keppra, 10 mg Omfi, and 200 mg Vempat 3 hours ago. Pt was given IV Valium en route. Husband states that pt is prone to having seizures when she is fatigued; he reports that she has been more fatigue recently due to new medications for her blastoma. Pt is currently being treated for the blastoma via pill and radiation therapy. Husband reports that pt's blood counts were low on April 12, but was still able to do chemo that day. Pt has an oncologist in [place] and at [place]. Pt denies pain. He denies LOC.   Past Medical History  Diagnosis Date  . Uterine  prolapse   . Cystocele   . GERD (gastroesophageal reflux disease)   . Elevated cholesterol   . Rectocele   . Hypertension   . Arthritis   . Type II or unspecified type diabetes mellitus without mention of complication, uncontrolled     Type 2  . Urinary incontinence     Urgency  . Anxiety state, unspecified   . Palpitations   . Allergic rhinitis, cause unspecified   . Other and unspecified hyperlipidemia   . Unspecified hypothyroidism   . Edema   . Insomnia, unspecified   . Depressive disorder, not elsewhere classified   . Unspecified urinary incontinence   . Peptic ulcer, unspecified site, unspecified as acute or chronic, without mention of hemorrhage, perforation, or obstruction   . Disturbance of skin sensation   . Calculus of kidney   . Facial spasm 04/05/2014  . Abnormal brain MRI 04/13/2014  . Focal motor seizure disorder ([place]) 05/04/2014    Facial and neck seizures, rleated  To brain mets.  Right face   . Monilial vaginitis 10/23/2014   Past Surgical History  Procedure Laterality Date  . Tonsillectomy and adenoidectomy  1963  . Dilation and curettage of uterus      3-05  . Hysteroscopy      3-05  . Endometrial biopsy      Dr.Gottsegen   . Tongue biopsy     Family History  Problem Relation Age of Onset  .  Hypertension Mother   . Breast cancer Mother 30  . Heart disease Father   . Stroke Father   . Hypertension Sister   . Heart disease Sister   . Osteoporosis Sister   . Diabetes Brother   . Hypertension Brother   . Heart disease Brother   . Other Brother     BRAIN TUMOR  . Cancer Brother     Eye cancer-Melanoma  . Diabetes Brother   . Heart disease Brother   . Heart attack Sister   . Heart disease Brother   . Heart attack Brother    Social History  Substance Use Topics  . Smoking status: Never Smoker   . Smokeless tobacco: Never Used  . Alcohol Use: No   OB History    Gravida Para Term Preterm AB TAB SAB Ectopic Multiple Living   1 1 1       1       Review of Systems  Neurological: Positive for seizures. Negative for syncope.  All other systems reviewed and are negative.  Allergies  Shellfish allergy; Ibuprofen; Latex; Naproxen; Nitrofurantoin monohyd macro; Paroxetine hcl; Prednisone; Prozac; and Tegretol  Home Medications   Prior to Admission medications   Medication Sig Start Date End Date Taking? Authorizing Provider  atorvastatin (LIPITOR) 10 MG tablet ONE DAILY TO CONTROL CHOLESTEROL 09/12/15  Yes Estill Dooms, MD  b complex vitamins tablet Take 1 tablet by mouth daily.   Yes Historical Provider, MD  cholecalciferol (VITAMIN D) 1000 UNITS tablet Take 1,000 Units by mouth daily. Takes 2000-5000mg  daily   Yes Historical Provider, MD  cloBAZam (ONFI) 10 MG tablet Take 10 mg by mouth at bedtime. 09/11/14  Yes Historical Provider, MD  docusate sodium (COLACE) 100 MG capsule Take 100 mg by mouth 2 (two) times daily.   Yes Historical Provider, MD  escitalopram (LEXAPRO) 10 MG tablet Take 5 mg by mouth daily.    Yes Historical Provider, MD  GLEOSTINE 100 MG capsule Take 100 mg by mouth every 6 (six) weeks. Coupled with Avastin chemo treatment at [facility] (total 140 mg) 09/11/15  Yes Historical Provider, MD  GLEOSTINE 40 MG capsule Take 40 mg by mouth every 6 (six) weeks. Coupled with Avastin chemo treatment at [facility] (total 140 mg) 09/11/15  Yes Historical Provider, MD  lacosamide (VIMPAT) 200 MG TABS tablet Take 200 mg by mouth 2 (two) times daily.   Yes Historical Provider, MD  Lancets MISC Use as directed. E11.9 04/17/15  Yes Estill Dooms, MD  levETIRAcetam (KEPPRA) 750 MG tablet TAKE 2 TABLETS BY MOUTH TWICE A DAY 06/05/15  Yes Marcial Pacas, MD  levothyroxine (SYNTHROID, LEVOTHROID) 125 MCG tablet TAKE 1 TABLET (125 MCG TOTAL) BY MOUTH DAILY BEFORE BREAKFAST. 08/07/15  Yes Estill Dooms, MD  loratadine (CLARITIN) 10 MG tablet Take 10 mg by mouth daily as needed for allergies.   Yes Historical Provider, MD  LORazepam (ATIVAN) 1 MG tablet  Take 0.5-1 mg by mouth 2 (two) times daily as needed. For seizure activity. 08/03/15  Yes Historical Provider, MD  metFORMIN (GLUCOPHAGE-XR) 500 MG 24 hr tablet TAKE 1 TABLET BY MOUTH TWICE A DAY TO CONTROL BLOOD SUGAR 08/28/15  Yes Estill Dooms, MD  ondansetron (ZOFRAN) 8 MG tablet Take 8 mg by mouth every 8 (eight) hours as needed (for nausea as needed prior to chemo pill is taken).  08/15/14  Yes Historical Provider, MD  ONE TOUCH ULTRA TEST test strip CHECK BLOOD GLUCOSE AS DIRECTED 09/24/14  Yes  Tiffany Lynelle Doctor, DO  pantoprazole (PROTONIX) 40 MG tablet One daily to reduce stomach acid 12/25/14  Yes Estill Dooms, MD  pseudoephedrine (SUDAFED) 30 MG tablet Take 30 mg by mouth every 4 (four) hours as needed for congestion.   Yes Historical Provider, MD  sennosides-docusate sodium (SENOKOT-S) 8.6-50 MG tablet Take 1 tablet by mouth 2 (two) times daily.    Yes Historical Provider, MD  valACYclovir (VALTREX) 1000 MG tablet Take 1,000 mg by mouth 2 (two) times daily as needed. To help resolve viral blisters   Yes Historical Provider, MD  ciprofloxacin (CIPRO) 500 MG tablet Take 1 tablet (500 mg total) by mouth 2 (two) times daily. Patient not taking: Reported on 09/21/2015 07/26/15   Estill Dooms, MD  doxycycline (VIBRA-TABS) 100 MG tablet Take 1 tablet (100 mg total) by mouth 2 (two) times daily. Patient not taking: Reported on 09/21/2015 09/13/15   Susanne Borders, NP  predniSONE (STERAPRED UNI-PAK 21 TAB) 10 MG (21) TBPK tablet Take 1 tablet (10 mg total) by mouth daily. Take 6 tabs by mouth daily  for 2 days, then 5 tabs for 2 days, then 4 tabs for 2 days, then 3 tabs for 2 days, 2 tabs for 2 days, then 1 tab by mouth daily for 2 days Patient not taking: Reported on 09/21/2015 08/03/15   Olivia Canter Sam, PA-C  temozolomide (TEMODAR) 100 MG capsule Take 1 capsule (100 mg total) by mouth at bedtime. May take on an empty stomach or at bedtime to decrease nausea & vomiting. Patient not taking: Reported on  09/21/2015 05/01/15   Ladell Pier, MD   BP 156/80 mmHg  Pulse 63  Temp(Src) 98.1 F (36.7 C) (Oral)  Resp 18  Ht 5\' 9"  (1.753 m)  Wt 148 lb (67.132 kg)  BMI 21.85 kg/m2  SpO2 96% Physical Exam  Constitutional: She is oriented to person, place, and time. She appears well-developed and well-nourished. No distress.  65 year old female appears chronically ill and somewhat debilitated  HENT:  Head: Normocephalic and atraumatic.  Eyes: EOM are normal.  Neck: Normal range of motion.  Cardiovascular: Normal rate, regular rhythm and normal heart sounds.   Pulmonary/Chest: Effort normal and breath sounds normal.  Abdominal: Soft. She exhibits no distension. There is no tenderness.  Musculoskeletal: Normal range of motion.  Neurological: She is alert and oriented to person, place, and time.  There is rhythmic twitching motion of the right shoulder. She is otherwise awake, alert, and responds to questions and commands appropriately. There is a right sided hemiparesis noted which is baseline for her.   Skin: Skin is warm and dry.  Psychiatric: She has a normal mood and affect. Judgment normal.  Nursing note and vitals reviewed.   ED Course  Procedures (including critical care time) DIAGNOSTIC STUDIES: Oxygen Saturation is 96% on RA, normal by my interpretation.    COORDINATION OF CARE: 11:48 PM-Discussed treatment plan which includes labs with pt at bedside and pt agreed to plan.    Labs Review Labs Reviewed - No data to display  Imaging Review No results found. I have personally reviewed and evaluated these images and lab results as part of my medical decision-making.   EKG Interpretation   Date/Time:  Saturday September 21 2015 23:02:45 EDT Ventricular Rate:  60 PR Interval:  192 QRS Duration: 107 QT Interval:  452 QTC Calculation: 452 R Axis:   -34 Text Interpretation:  Sinus rhythm Left axis deviation Abnormal R-wave  progression,  early transition Abnormal T, consider  ischemia, diffuse leads  Confirmed by Lebert Lovern  MD, Marnisha Stampley ([id number]) on 09/22/2015 12:12:40 AM      MDM   Final diagnoses:  None    Patient brought by EMS for evaluation of seizure activity. She has a history of GBM treated at [facility]. She was given Xanax at home followed by Valium by EMS. She was having twitching of her right shoulder while in the ER. This resolved with an additional 1 mg dose of Ativan. Her workup reveals no acute change on her head CT and laboratory studies are unremarkable. Patient's husband was initially uncomfortable with her leaving so soon after receiving these medications and requested that we watch her a little bit longer. She has rested very comfortably while here. I see no indication for admission and believe she is appropriate for discharge. We will make arrangements for [facility] to provide transport home.  I personally performed the services described in this documentation, which was scribed in my presence. The recorded information has been reviewed and is accurate.        Veryl Speak, MD 09/22/15 0530

## 2015-09-21 NOTE — ED Notes (Signed)
Bed: WA08 Expected date:  Expected time:  Means of arrival:  Comments: EMS focal seizures, brain CA

## 2015-09-22 ENCOUNTER — Emergency Department (HOSPITAL_COMMUNITY): Payer: Medicare Other

## 2015-09-22 DIAGNOSIS — G40909 Epilepsy, unspecified, not intractable, without status epilepticus: Secondary | ICD-10-CM | POA: Diagnosis not present

## 2015-09-22 LAB — CBC WITH DIFFERENTIAL/PLATELET
BASOS ABS: 0 10*3/uL (ref 0.0–0.1)
BASOS PCT: 0 %
EOS ABS: 0.1 10*3/uL (ref 0.0–0.7)
EOS PCT: 2 %
HCT: 34.8 % — ABNORMAL LOW (ref 36.0–46.0)
Hemoglobin: 12 g/dL (ref 12.0–15.0)
Lymphocytes Relative: 30 %
Lymphs Abs: 1.1 10*3/uL (ref 0.7–4.0)
MCH: 35.3 pg — ABNORMAL HIGH (ref 26.0–34.0)
MCHC: 34.5 g/dL (ref 30.0–36.0)
MCV: 102.4 fL — ABNORMAL HIGH (ref 78.0–100.0)
MONO ABS: 0.4 10*3/uL (ref 0.1–1.0)
Monocytes Relative: 12 %
Neutro Abs: 2 10*3/uL (ref 1.7–7.7)
Neutrophils Relative %: 56 %
PLATELETS: 120 10*3/uL — AB (ref 150–400)
RBC: 3.4 MIL/uL — ABNORMAL LOW (ref 3.87–5.11)
RDW: 13.5 % (ref 11.5–15.5)
WBC: 3.6 10*3/uL — ABNORMAL LOW (ref 4.0–10.5)

## 2015-09-22 LAB — BASIC METABOLIC PANEL
ANION GAP: 4 — AB (ref 5–15)
BUN: 17 mg/dL (ref 6–20)
CALCIUM: 9 mg/dL (ref 8.9–10.3)
CO2: 28 mmol/L (ref 22–32)
Chloride: 106 mmol/L (ref 101–111)
Creatinine, Ser: 0.71 mg/dL (ref 0.44–1.00)
GFR calc Af Amer: >60 mL/min (ref >60)
GLUCOSE: 104 mg/dL — AB (ref 65–99)
Potassium: 3.6 mmol/L (ref 3.5–5.1)
Sodium: 138 mmol/L (ref 135–145)

## 2015-09-22 NOTE — Discharge Instructions (Signed)
Follow-up with your oncologist and neurologist at [facility] next week, and return to the ER symptoms significantly worsen or change.

## 2015-09-23 NOTE — Addendum Note (Signed)
Addended by: Isaias Cowman on: 09/23/2015 08:38 AM   Modules accepted: Orders

## 2015-09-24 ENCOUNTER — Ambulatory Visit: Payer: Medicare Other | Admitting: Physical Therapy

## 2015-09-25 ENCOUNTER — Other Ambulatory Visit: Payer: Self-pay | Admitting: *Deleted

## 2015-09-25 ENCOUNTER — Telehealth: Payer: Self-pay | Admitting: *Deleted

## 2015-09-25 ENCOUNTER — Other Ambulatory Visit: Payer: Self-pay | Admitting: Oncology

## 2015-09-25 NOTE — Telephone Encounter (Signed)
Per staff message and POF I have scheduled appts. Advised scheduler of appts. JMW  

## 2015-09-26 ENCOUNTER — Ambulatory Visit (HOSPITAL_BASED_OUTPATIENT_CLINIC_OR_DEPARTMENT_OTHER): Payer: Medicare Other | Admitting: Oncology

## 2015-09-26 ENCOUNTER — Telehealth: Payer: Self-pay | Admitting: Nurse Practitioner

## 2015-09-26 ENCOUNTER — Telehealth: Payer: Self-pay | Admitting: *Deleted

## 2015-09-26 ENCOUNTER — Other Ambulatory Visit (HOSPITAL_BASED_OUTPATIENT_CLINIC_OR_DEPARTMENT_OTHER): Payer: Medicare Other

## 2015-09-26 ENCOUNTER — Encounter: Payer: Self-pay | Admitting: *Deleted

## 2015-09-26 ENCOUNTER — Other Ambulatory Visit: Payer: Self-pay | Admitting: *Deleted

## 2015-09-26 ENCOUNTER — Ambulatory Visit: Payer: Medicare Other | Admitting: Physical Therapy

## 2015-09-26 ENCOUNTER — Ambulatory Visit (HOSPITAL_BASED_OUTPATIENT_CLINIC_OR_DEPARTMENT_OTHER): Payer: Medicare Other

## 2015-09-26 VITALS — BP 122/83 | HR 63 | Temp 97.5°F | Resp 18 | Ht 69.0 in

## 2015-09-26 VITALS — BP 165/97 | HR 65

## 2015-09-26 DIAGNOSIS — E119 Type 2 diabetes mellitus without complications: Secondary | ICD-10-CM

## 2015-09-26 DIAGNOSIS — R42 Dizziness and giddiness: Secondary | ICD-10-CM

## 2015-09-26 DIAGNOSIS — R351 Nocturia: Secondary | ICD-10-CM

## 2015-09-26 DIAGNOSIS — I10 Essential (primary) hypertension: Secondary | ICD-10-CM

## 2015-09-26 DIAGNOSIS — C711 Malignant neoplasm of frontal lobe: Secondary | ICD-10-CM

## 2015-09-26 DIAGNOSIS — C719 Malignant neoplasm of brain, unspecified: Secondary | ICD-10-CM

## 2015-09-26 DIAGNOSIS — E039 Hypothyroidism, unspecified: Secondary | ICD-10-CM

## 2015-09-26 DIAGNOSIS — Z86718 Personal history of other venous thrombosis and embolism: Secondary | ICD-10-CM

## 2015-09-26 DIAGNOSIS — Z5112 Encounter for antineoplastic immunotherapy: Secondary | ICD-10-CM

## 2015-09-26 DIAGNOSIS — R1013 Epigastric pain: Secondary | ICD-10-CM

## 2015-09-26 DIAGNOSIS — N200 Calculus of kidney: Secondary | ICD-10-CM

## 2015-09-26 LAB — URINALYSIS, MICROSCOPIC - CHCC
Bilirubin (Urine): NEGATIVE
Blood: NEGATIVE
GLUCOSE UR CHCC: NEGATIVE mg/dL
KETONES: NEGATIVE mg/dL
Nitrite: NEGATIVE
PH: 6 (ref 4.6–8.0)
PROTEIN: NEGATIVE mg/dL
RBC / HPF: NEGATIVE (ref 0–2)
SPECIFIC GRAVITY, URINE: 1.015 (ref 1.003–1.035)
Urobilinogen, UR: 0.2 mg/dL (ref 0.2–1)

## 2015-09-26 LAB — COMPREHENSIVE METABOLIC PANEL
ALBUMIN: 3.9 g/dL (ref 3.5–5.0)
ALK PHOS: 49 U/L (ref 40–150)
ALT: 20 U/L (ref 0–55)
ANION GAP: 9 meq/L (ref 3–11)
AST: 15 U/L (ref 5–34)
BILIRUBIN TOTAL: 0.56 mg/dL (ref 0.20–1.20)
BUN: 16.3 mg/dL (ref 7.0–26.0)
CALCIUM: 9.5 mg/dL (ref 8.4–10.4)
CO2: 26 mEq/L (ref 22–29)
CREATININE: 0.7 mg/dL (ref 0.6–1.1)
Chloride: 105 mEq/L (ref 98–109)
EGFR: 88 mL/min/{1.73_m2} — ABNORMAL LOW (ref >90)
Glucose: 122 mg/dl (ref 70–140)
Potassium: 4 mEq/L (ref 3.5–5.1)
SODIUM: 140 meq/L (ref 136–145)
TOTAL PROTEIN: 6.3 g/dL — AB (ref 6.4–8.3)

## 2015-09-26 LAB — CBC WITH DIFFERENTIAL/PLATELET
BASO%: 0.8 % (ref 0.0–2.0)
Basophils Absolute: 0 10*3/uL (ref 0.0–0.1)
EOS ABS: 0 10*3/uL (ref 0.0–0.5)
EOS%: 1.3 % (ref 0.0–7.0)
HCT: 40.8 % (ref 34.8–46.6)
HGB: 13.5 g/dL (ref 11.6–15.9)
LYMPH%: 27.6 % (ref 14.0–49.7)
MCH: 35.2 pg — ABNORMAL HIGH (ref 25.1–34.0)
MCHC: 33 g/dL (ref 31.5–36.0)
MCV: 106.7 fL — AB (ref 79.5–101.0)
MONO#: 0.4 10*3/uL (ref 0.1–0.9)
MONO%: 12.1 % (ref 0.0–14.0)
NEUT%: 58.2 % (ref 38.4–76.8)
NEUTROS ABS: 1.9 10*3/uL (ref 1.5–6.5)
PLATELETS: 128 10*3/uL — AB (ref 145–400)
RBC: 3.82 10*6/uL (ref 3.70–5.45)
RDW: 14.7 % — ABNORMAL HIGH (ref 11.2–14.5)
WBC: 3.3 10*3/uL — AB (ref 3.9–10.3)
lymph#: 0.9 10*3/uL (ref 0.9–3.3)

## 2015-09-26 MED ORDER — SODIUM CHLORIDE 0.9 % IV SOLN
Freq: Once | INTRAVENOUS | Status: AC
  Administered 2015-09-26: 11:00:00 via INTRAVENOUS

## 2015-09-26 MED ORDER — SODIUM CHLORIDE 0.9 % IV SOLN
10.0000 mg/kg | Freq: Once | INTRAVENOUS | Status: AC
  Administered 2015-09-26: 675 mg via INTRAVENOUS
  Filled 2015-09-26: qty 27

## 2015-09-26 NOTE — Telephone Encounter (Signed)
per pof to sch pt appt-sent MW email to [facility] trmt-pt to get updated copy of avs b4 leavin °

## 2015-09-26 NOTE — Progress Notes (Signed)
  [facility] OFFICE PROGRESS NOTE   Diagnosis: Glioblastoma  INTERVAL HISTORY:   Ms. Dresden returns with her husband and son. She was noted have disease progression on an MRI at [facility] 09/11/2015. Treatment was switched to CCNU/Avastin. She took CCNU on 09/11/2015. She had a few episodes of diarrhea following the CCNU. No other apparent toxicity.  She developed a right arm seizure on 09/21/2015 and was evaluated in the emergency room. She was alert. She was given Valium by EMS and additional Ativan in the emergency room. The seizures resolved.  Mr. Hueber reports she is able to ambulate intermittently. She recently had multiple episodes of nocturia. We saw her in the symptom management clinic 2 weeks ago with an area of erythema and warmth at the left lower leg. She was treated for cellulitis and the erythema resolved.  Objective:  Vital signs in last 24 hours:  Blood pressure 122/83, pulse 63, temperature 97.5 F (36.4 C), temperature source Oral, resp. rate 18, height '5\' 9"'$  (1.753 m), SpO2 100 %.    HEENT: No thrush or ulcers Resp: Lungs clear bilaterally Cardio: Regular rate and rhythm Vascular: No leg edema Neuro: Alert, minimal verbal interaction, weakness of the right arm and leg  Skin: Left lower leg without erythema    Lab Results:  Lab Results  Component Value Date   WBC 3.3* 09/26/2015   HGB 13.5 09/26/2015   HCT 40.8 09/26/2015   MCV 106.7* 09/26/2015   PLT 128* 09/26/2015   NEUTROABS 1.9 09/26/2015     Medications: I have reviewed the patient's current medications.  Assessment/Plan: 1. 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  MGMT unmethylated  IDH1 and IDH2 negative  TERT positive  Initiation of radiation and concurrent temozolomide, 42 days, 08/06/2014  Cycle 1 Adjuvant 5 day temozolomide 10/04/2014  Cycle 2 adjuvant 5 day temozolomide 11/05/2014  Brain MRI 11/29/2014 with no evidence  of disease progression  Cycle 3 adjuvant 5 day temozolomide 12/13/2014  Brain MRI 01/31/2015 with no evidence of disease progression  Cycle 4 adjuvant 5 day temozolomide 02/08/2015 (dose reduced due to thrombocytopenia)  Cycle 5 adjuvant 5 day temozolomide beginning 03/08/2015  MRI at [facility] 03/27/2015 with increased nodular enhancement at the left frontoparietal resection bed compatible with disease progression  Treatment changed to protracted daily temozolomide beginning 04/05/2015  Radiographic progression with enlargement of lesions in the left frontal and left parietal areas 05/22/2015  Treatment changed to irinotecan/Avastin every 2 weeks  Radiographic progression 09/11/2015-irinotecan discontinued, CCNU 82.5 mg/m given 09/11/2015 and Avastin continued on a 2 week schedule  2. History of seizures secondary to #1  Partial seizure, right upper extremity 09/21/2015  3. 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 Venous Doppler 02/01/2015 negative for DVT. IVC filter removed 02/26/2015.  4. Right-sided weakness and expressive aphasia secondary to #1  5. Diabetes  6. Hypertension  7. Hyperlipidemia    Disposition:  Ms. Bernardy has a progressive glioblastoma. Treatment was switched to CCNU/Avastin on 09/11/2015. She returns today for Avastin. Her husband reports she has a history of recent polyuria. We will check a urinalysis today.  We decided to consolidate her medical regimen given the poor prognosis associated with the GBM. She will discontinue atorvastatin, B complex vitamins, and vitamin D.  Ms. Vanderweide will receive Avastin today. She will return for an office visit in 2 weeks.  Betsy Coder, MD  09/26/2015  9:55 AM

## 2015-09-26 NOTE — Progress Notes (Signed)
[place] Work  Clinical Social Work was contacted by patient's husband via phone for assistance.  He had shared lengthy history and now need for additional support in the home. He is open to [facility] referral and [facility] referral. CSW made RN aware. Husband agrees to continue to contact CSW for support. by (patient navigator, nurse, radiation oncologist, medical oncologist, financial counselor, patient) for assessment of psychosocial needs.  Clinical Education officer, museum (met with or contacted) patient (at [facility], at home, or in the hospital) to offer support and assess for needs.    Clinical Social Work interventions:  Supportive listening Resource education  Loren Racer, [place] Worker [place]  [place] Phone: [phone] Fax: [phone]

## 2015-09-26 NOTE — Patient Instructions (Signed)
Bevacizumab injection What is this medicine? BEVACIZUMAB (be va SIZ yoo mab) is a monoclonal antibody. It is used to treat cervical cancer, colorectal cancer, glioblastoma multiforme, non-small cell lung cancer (NSCLC), ovarian cancer, and renal cell cancer. This medicine may be used for other purposes; ask your health care provider or pharmacist if you have questions. What should I tell my health care provider before I take this medicine? They need to know if you have any of these conditions: -blood clots -heart disease, including heart failure, heart attack, or chest pain (angina) -high blood pressure -infection (especially a virus infection such as chickenpox, cold sores, or herpes) -kidney disease -lung disease -prior chemotherapy with doxorubicin, daunorubicin, epirubicin, or other anthracycline type chemotherapy agents -recent or ongoing radiation therapy -recent surgery -stroke -an unusual or allergic reaction to bevacizumab, hamster proteins, mouse proteins, other medicines, foods, dyes, or preservatives -pregnant or trying to get pregnant -breast-feeding How should I use this medicine? This medicine is for infusion into a vein. It is given by a health care professional in a hospital or clinic setting. Talk to your pediatrician regarding the use of this medicine in children. Special care may be needed. Overdosage: If you think you have taken too much of this medicine contact a poison control center or emergency room at once. NOTE: This medicine is only for you. Do not share this medicine with others. What if I miss a dose? It is important not to miss your dose. Call your doctor or health care professional if you are unable to keep an appointment. What may interact with this medicine? Interactions are not expected. This list may not describe all possible interactions. Give your health care provider a list of all the medicines, herbs, non-prescription drugs, or dietary supplements  you use. Also tell them if you smoke, drink alcohol, or use illegal drugs. Some items may interact with your medicine. What should I watch for while using this medicine? Your condition will be monitored carefully while you are receiving this medicine. You will need important blood work and urine testing done while you are taking this medicine. During your treatment, let your health care professional know if you have any unusual symptoms, such as difficulty breathing. This medicine may rarely cause 'gastrointestinal perforation' (holes in the stomach, intestines or colon), a serious side effect requiring surgery to repair. This medicine should be started at least 28 days following major surgery and the site of the surgery should be totally healed. Check with your doctor before scheduling dental work or surgery while you are receiving this treatment. Talk to your doctor if you have recently had surgery or if you have a wound that has not healed. Do not become pregnant while taking this medicine or for 6 months after stopping it. Women should inform their doctor if they wish to become pregnant or think they might be pregnant. There is a potential for serious side effects to an unborn child. Talk to your health care professional or pharmacist for more information. Do not breast-feed an infant while taking this medicine. This medicine has caused ovarian failure in some women. This medicine may interfere with the ability to have a child. You should talk to your doctor or health care professional if you are concerned about your fertility. What side effects may I notice from receiving this medicine? Side effects that you should report to your doctor or health care professional as soon as possible: -allergic reactions like skin rash, itching or hives, swelling of the face,   lips, or tongue -signs of infection - fever or chills, cough, sore throat, pain or trouble passing urine -signs of decreased platelets or  bleeding - bruising, pinpoint red spots on the skin, black, tarry stools, nosebleeds, blood in the urine -breathing problems -changes in vision -chest pain -confusion -jaw pain, especially after dental work -mouth sores -seizures -severe abdominal pain -severe headache -sudden numbness or weakness of the face, arm or leg -swelling of legs or ankles -symptoms of a stroke: change in mental awareness, inability to talk or move one side of the body (especially in patients with lung cancer) -trouble passing urine or change in the amount of urine -trouble speaking or understanding -trouble walking, dizziness, loss of balance or coordination Side effects that usually do not require medical attention (report to your doctor or health care professional if they continue or are bothersome): -constipation -diarrhea -dry skin -headache -loss of appetite -nausea, vomiting This list may not describe all possible side effects. Call your doctor for medical advice about side effects. You may report side effects to FDA at 1-800-FDA-1088. Where should I keep my medicine? This drug is given in a hospital or clinic and will not be stored at home. NOTE: This sheet is a summary. It may not cover all possible information. If you have questions about this medicine, talk to your doctor, pharmacist, or health care provider.    2016, Elsevier/Gold Standard. (2014-07-17 16:58:44)  

## 2015-09-26 NOTE — Telephone Encounter (Signed)
Per staff message and POF I have scheduled appts. Advised scheduler of appts. JMW  

## 2015-09-26 NOTE — Telephone Encounter (Signed)
Call from pt's husband reporting BP remains elevated at home (150/90). He had not been giving her Lisinopril (prescribed by MD at [facility]). Asks if he should resume this? YES. Will make Dr. Benay Spice aware.

## 2015-09-26 NOTE — Progress Notes (Signed)
Pt finished Avastin without difficulty. Post Avastin BP taken and BP elevated. BP taken in both arms with no significant change in BP. Dr. Benay Spice and pharmacy aware. Pt sitting in no apparent distess and denies any headache or vision changes. Rechecked BP after 20 minutes and BP 165/97. Refer to flowsheets for BP. Dr. Benay Spice aware and ok for pt to be discharged home. Pt instructed to check BP meds at home and call Lavella Lemons RN to discuss whether to take meds or add BP meds with next Avastin infusion.Phone number to call reviewed with pt. Pt and husband verbalized understanding and had no further questions. Pt left via wheelchair with husband and son in no apparent distress.

## 2015-09-27 ENCOUNTER — Telehealth: Payer: Self-pay | Admitting: *Deleted

## 2015-09-27 DIAGNOSIS — I10 Essential (primary) hypertension: Secondary | ICD-10-CM

## 2015-09-27 DIAGNOSIS — N200 Calculus of kidney: Secondary | ICD-10-CM

## 2015-09-27 DIAGNOSIS — E039 Hypothyroidism, unspecified: Secondary | ICD-10-CM

## 2015-09-27 DIAGNOSIS — R1013 Epigastric pain: Secondary | ICD-10-CM

## 2015-09-27 NOTE — Telephone Encounter (Signed)
-----   Message from Ladell Pier, MD sent at 09/26/2015  6:01 PM EDT ----- Please call patient, ua not suggestive of uti, call for persistent symtoms, send culture if we have specimen

## 2015-09-27 NOTE — Telephone Encounter (Signed)
Per Dr. Benay Spice, pt.'s husband notified that ua not suggestive of uti, pt to call for persistent symptoms.  Lab called and U/A sent for culture.  Order placed.  Pt.'s husband appreciative of call and has no questions at this time.

## 2015-09-30 ENCOUNTER — Telehealth: Payer: Self-pay | Admitting: *Deleted

## 2015-09-30 LAB — URINE CULTURE

## 2015-09-30 NOTE — Telephone Encounter (Signed)
Call from Archie Endo, [place], NP: Pt's husband is asking if she needs to take next round of Avastin. He is requesting to have MRI done in [place], sooner than currently scheduled at [facility].  Discussed with Dr. Benay Spice: We can order MRI to be done locally. Pt will be seen in office prior to next infusion appt. We can cancel it that day if necessary. MD recommends waiting on MRI until then, need to give it some time to show whether treatment helped. Rodena Piety notified of above, she will contact Mr. Oja.

## 2015-10-01 ENCOUNTER — Telehealth: Payer: Self-pay | Admitting: *Deleted

## 2015-10-01 MED ORDER — CIPROFLOXACIN HCL 500 MG PO TABS: 500.0000 mg | ORAL_TABLET | Freq: Two times a day (BID) | ORAL | Status: DC

## 2015-10-01 NOTE — Telephone Encounter (Signed)
-----   Message from Ladell Pier, MD sent at 09/30/2015  8:45 PM EDT ----- Please call patient, urine culture is positive, begin cipro. 500mg  bid for 5 days

## 2015-10-01 NOTE — Telephone Encounter (Signed)
Informed pt's husband of urinalysis result. Instructed him to have her begin Cipro BID for 5 days. Rx sent electronically.

## 2015-10-02 ENCOUNTER — Encounter: Payer: Self-pay | Admitting: Internal Medicine

## 2015-10-02 ENCOUNTER — Telehealth: Payer: Self-pay | Admitting: *Deleted

## 2015-10-02 NOTE — Telephone Encounter (Signed)
"  [facility] is receiving lab results addressed to [facility].   Our fax number is [phone].  [place] doesn't work here and this is the fourth fax we've received from your office.   Lab notified and will remove this fax number with the request from patient's profile.

## 2015-10-06 ENCOUNTER — Other Ambulatory Visit: Payer: Self-pay | Admitting: Oncology

## 2015-10-07 ENCOUNTER — Other Ambulatory Visit: Payer: Self-pay | Admitting: *Deleted

## 2015-10-07 ENCOUNTER — Encounter: Payer: Self-pay | Admitting: *Deleted

## 2015-10-07 ENCOUNTER — Telehealth: Payer: Self-pay | Admitting: Oncology

## 2015-10-07 NOTE — Progress Notes (Signed)
Call received from Archie Endo NP with palliative care asking for hospice referral from Dr. Benay Spice for pt.  Order received from Dr. Benay Spice and faxed to [facility].  Per Rodena Piety, pt wishes to cancel all of her appts on 10/10/15.

## 2015-10-07 NOTE — Telephone Encounter (Signed)
CANCELLED APPT PER POF, PT AWARE

## 2015-10-10 ENCOUNTER — Ambulatory Visit: Payer: Medicare Other

## 2015-10-10 ENCOUNTER — Ambulatory Visit: Payer: Medicare Other | Admitting: Nurse Practitioner

## 2015-10-10 ENCOUNTER — Other Ambulatory Visit: Payer: Medicare Other

## 2015-10-21 ENCOUNTER — Telehealth: Payer: Self-pay | Admitting: *Deleted

## 2015-10-21 MED ORDER — LORAZEPAM 1 MG PO TABS: ORAL_TABLET | ORAL | Status: DC

## 2015-10-21 MED ORDER — DEXAMETHASONE 4 MG PO TABS: ORAL_TABLET | ORAL | Status: DC

## 2015-10-21 NOTE — Telephone Encounter (Signed)
Message from [place], Hospice RN: Pt is not taking any steroid. She continues Keppra and Vimpat. Dr. Benay Spice made aware: Order received for Decadron 4 mg BID for 4 days then one daily.

## 2015-10-21 NOTE — Telephone Encounter (Signed)
Message from [name], [place] RN reporting pt is having seizures up to 3 times per week. Husband describes seizures as severe jerking of extremities. Managing this with Ativan 1 mg, repeats in 40 minutes if not resolved. Kelly Fletcher is requesting prescription to reflect above.  Reviewed with Dr. Benay Spice: Is pt taking steroid?

## 2015-11-02 ENCOUNTER — Other Ambulatory Visit: Payer: Self-pay | Admitting: Oncology

## 2015-11-12 ENCOUNTER — Other Ambulatory Visit: Payer: Self-pay | Admitting: *Deleted

## 2015-11-12 MED ORDER — CIPROFLOXACIN HCL 500 MG PO TABS: 500.0000 mg | ORAL_TABLET | Freq: Two times a day (BID) | ORAL | Status: AC

## 2015-11-12 NOTE — Telephone Encounter (Signed)
Message from [employer], Hospice RN: saw pt today as a work-in. Pt is having dysuria and frequency, RN is asking to repeat Cipro. Reviewed with Dr. Benay Spice: Order received for Cipro 500 mg BID for 5 days. Called pt's husband with instructions.

## 2015-11-13 ENCOUNTER — Ambulatory Visit: Payer: Medicare Other | Admitting: Internal Medicine

## 2015-11-15 ENCOUNTER — Other Ambulatory Visit: Payer: Self-pay | Admitting: Oncology

## 2015-11-18 ENCOUNTER — Telehealth: Payer: Self-pay | Admitting: *Deleted

## 2015-11-18 ENCOUNTER — Other Ambulatory Visit: Payer: Self-pay | Admitting: Oncology

## 2015-11-18 MED ORDER — SORBITOL 70 % PO SOLN
15.0000 mL | Freq: Every day | ORAL | Status: AC | PRN
Start: 2015-11-18 — End: ?

## 2015-11-18 NOTE — Telephone Encounter (Signed)
Message from [place], [place] RN reporting pt is constipated. Last BM 6/10. No stool noted on digital exam. Requesting order for Sorbitol. Reviewed with Dr. Benay Spice: Order received, e-prescribed.  Pt's husband informed of new Rx. Recommended add Sorbitol daily PRN in addition to Senna twice daily.  He voiced understanding, plans to pick it up tomorrow.

## 2015-11-20 ENCOUNTER — Ambulatory Visit: Payer: Medicare Other | Admitting: Internal Medicine

## 2015-11-21 ENCOUNTER — Encounter: Payer: Self-pay | Admitting: *Deleted

## 2015-11-21 ENCOUNTER — Telehealth: Payer: Self-pay | Admitting: *Deleted

## 2015-11-21 ENCOUNTER — Other Ambulatory Visit: Payer: Self-pay | Admitting: *Deleted

## 2015-11-21 NOTE — Telephone Encounter (Signed)
Received call from [facility] RN/Hospice/[place] stating that pt has not had a BM since 11/09/15 & husband is concerned.  She reports that several nurses have been out & given enema's with no results & she was checked for an impaction yest.  She has +BS, no n/v & is eating 1/3-1/2 of her normal. It is hard to know if she is having any abdominal pain b/c she can't tell her husband.  Sorbitol 15 ml was given yest & they have an order for Sorbitol 30 ml if unable to tol. Liq or pt prefers liq.  She has been on colace 50 mg bid & senakot s bid but husband was told to hold until further notice.  Any suggestions?  Wendy's call back # is [phone].  Message to Dr Sherrill/Pod RN

## 2015-11-21 NOTE — Progress Notes (Signed)
Call received from [place]as [place] of Patient Care with [facility] to confirm dose of Decadron for patient.  Per Dr. Benay Spice, pt is to be taking Decadron 4 mg daily. Message left for Abigail Butts regarding MD order.

## 2015-11-21 NOTE — Telephone Encounter (Signed)
Return call placed to Kelly Fletcher with [facility] with new orders per Dr. Benay Spice for constipation.  Per Dr. Benay Spice, pt to take Sorbitol 30 cc every 6 hrs until BM while awake and then 15-30 cc daily.  Message left for Abigail Butts with MD orders.

## 2015-11-28 ENCOUNTER — Encounter: Payer: Self-pay | Admitting: *Deleted

## 2015-11-28 ENCOUNTER — Other Ambulatory Visit: Payer: Self-pay | Admitting: *Deleted

## 2015-11-28 ENCOUNTER — Telehealth: Payer: Self-pay | Admitting: *Deleted

## 2015-11-28 MED ORDER — LACOSAMIDE 200 MG PO TABS: 200.0000 mg | ORAL_TABLET | Freq: Two times a day (BID) | ORAL | Status: DC

## 2015-11-28 MED ORDER — LACOSAMIDE 200 MG PO TABS: 200.0000 mg | ORAL_TABLET | Freq: Two times a day (BID) | ORAL | Status: AC

## 2015-11-28 NOTE — Progress Notes (Signed)
Prescription for Vimpat faxed to [facility] per pt.'s husband request.  Fax transmission confirmed.

## 2015-11-28 NOTE — Telephone Encounter (Signed)
Call received from [place] stating that pt needs refill on Vimpat.  OK to refill per Dr. Benay Spice.

## 2015-11-28 NOTE — Telephone Encounter (Signed)
"  Patient needs a refill on Vimpat.  Husband has enough to last through Friday or Saturday.  [facility] [address]."

## 2015-12-04 ENCOUNTER — Telehealth: Payer: Self-pay | Admitting: *Deleted

## 2015-12-04 DIAGNOSIS — C719 Malignant neoplasm of brain, unspecified: Secondary | ICD-10-CM

## 2015-12-04 MED ORDER — SENNOSIDES 8.6 MG PO TABS: 2.0000 | ORAL_TABLET | Freq: Two times a day (BID) | ORAL | Status: DC

## 2015-12-04 MED ORDER — CLOBAZAM 10 MG PO TABS: 10.0000 mg | ORAL_TABLET | Freq: Every day | ORAL | Status: AC

## 2015-12-04 NOTE — Telephone Encounter (Signed)
Call from [place], [facility] RN requesting refills to be called in for Senna- S pt takes 2 BID. And for Onfi 10 mg QHS. OK to refill per Dr. Benay Spice. Medications called to pt's pharmacy.

## 2015-12-10 ENCOUNTER — Telehealth: Payer: Self-pay | Admitting: *Deleted

## 2015-12-10 NOTE — Telephone Encounter (Signed)
Message from [place], [facility] RN requesting order for atropine for oral secretions. Pt's husband is requesting liquid Tylenol for pain. Pt grimaces while being turned/ positioned. Reviewed with Dr. Benay Spice: OK to order Tylenol 650 mg Q4h PRN pain. Atropine per hospice standing order.  Verbal order given to Vicie Mutters, Hospice RN.

## 2015-12-17 ENCOUNTER — Other Ambulatory Visit: Payer: Self-pay | Admitting: Oncology

## 2015-12-20 ENCOUNTER — Other Ambulatory Visit: Payer: Self-pay | Admitting: Internal Medicine

## 2015-12-24 ENCOUNTER — Other Ambulatory Visit: Payer: Self-pay | Admitting: *Deleted

## 2015-12-24 ENCOUNTER — Telehealth: Payer: Self-pay | Admitting: *Deleted

## 2015-12-24 MED ORDER — LORAZEPAM 2 MG/ML PO CONC: 1.0000 mg | Freq: Three times a day (TID) | ORAL | 0 refills | Status: AC

## 2015-12-24 NOTE — Telephone Encounter (Signed)
Message received from Vicie Mutters with [facility] requesting Ativan be switched to liquid d/t pt having difficulty with pills.  Per Dr. Benay Spice, [place] for liquid Ativan.  Robin notified of OK for change per Dr. Benay Spice and [facility] [address].

## 2015-12-26 ENCOUNTER — Telehealth: Payer: Self-pay | Admitting: *Deleted

## 2015-12-26 NOTE — Telephone Encounter (Signed)
Vicie Mutters with [facility] notified of orders from Dr. Benay Spice.

## 2015-12-26 NOTE — Telephone Encounter (Signed)
Call received from Vicie Mutters with [facility] to obtain order from Dr. Benay Spice to change Vimpat, Keppra and Onfi to liquid form. Per Dr. Benay Spice, [place] to change Vimpat, Keppra and Onfi to liquid.

## 2016-01-01 ENCOUNTER — Telehealth: Payer: Self-pay | Admitting: *Deleted

## 2016-01-01 NOTE — Telephone Encounter (Signed)
Call from Shirlean Mylar, [facility] RN requesting MD call pt's husband. He is adamant on continuing pt's anticonvulsant medications even though she is at end of life and unable to swallow.  Pt takes Keppra, Vimpat and Onfi.  Husband requested suppository form which is not available.  Hospice MD recommended discontinuing meds but pt's husband was not amenable to this. Stated he wanted a doctor to see pt. Mr. Bonfiglio wants "a more respected opinion than a nurse" to decide what to do with her seizure medicines.  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 Per Shirlean Mylar, hospice MD does not go out to home. She is requesting Dr. Benay Spice speak with Mr. Porta.

## 2016-01-03 NOTE — Telephone Encounter (Signed)
Dr. Benay Spice spoke with Mr. Kelly Fletcher on 8/3: Instructed him to decrease Keppra to 750 mg BID for three days then go to 750 mg daily. Decrease Vimpat to once daily and stop Onfi.  Fernanda Drum, Hospice RN with above information. She will reinforce instructions with Mr. Catherine.

## 2016-01-07 ENCOUNTER — Telehealth: Payer: Self-pay | Admitting: *Deleted

## 2016-01-07 NOTE — Telephone Encounter (Signed)
Message received from Vicie Mutters with [facility] with request from patient's husband to change Vimpat from liquid to tablet.  Per Dr. Benay Spice, [place] to change Vimpat to tablet as pt tolerates.  Robin notified.

## 2016-01-08 ENCOUNTER — Other Ambulatory Visit: Payer: Self-pay | Admitting: Internal Medicine

## 2016-01-16 ENCOUNTER — Other Ambulatory Visit: Payer: Self-pay | Admitting: Nurse Practitioner

## 2016-01-16 ENCOUNTER — Other Ambulatory Visit: Payer: Self-pay | Admitting: *Deleted

## 2016-01-16 MED ORDER — OXYCODONE-ACETAMINOPHEN 5-325 MG/5ML PO SOLN: 5.0000 mL | ORAL | 0 refills | Status: AC | PRN

## 2016-01-16 MED ORDER — OXYCODONE HCL 20 MG/ML PO CONC: 6.0000 mg | ORAL | 0 refills | Status: AC | PRN

## 2016-01-16 NOTE — Telephone Encounter (Signed)
Hospice Nurse, Ria Comment, calling regarding Vimpat refill; requesting 2-3 refills be sent in, since they only come in 15 day supply and family is having to call weekly for refills; also, patient has new onset of sacral decubitus, causing discomfort. Husband is giving patient tylenol 1,000 mg every 6 hours, but would like to know if something stronger could be prescribed. Hospice Nurse is requesting call back at [phone].

## 2016-01-16 NOTE — Telephone Encounter (Signed)
Discussed with Dr. Benay Spice. OK to refill Vimpat, order received for Roxicet elixir 5mg  Q 4hours PRN.

## 2016-01-16 NOTE — Addendum Note (Signed)
Addended by: Brien Few on: 01/16/2016 01:14 PM   Modules accepted: Orders

## 2016-01-16 NOTE — Telephone Encounter (Addendum)
Call from [place], Roxicet elixir is no longer available. Reviewed with Ned Card NP: Order received for Oxyfast PRN. Rx faxed to pharmacy. Ria Comment, Hospice RN notified. She will inform/ teach husband and refill Vimpat for 15 day supply with refills.

## 2016-01-18 ENCOUNTER — Other Ambulatory Visit: Payer: Self-pay | Admitting: Oncology

## 2016-01-29 ENCOUNTER — Other Ambulatory Visit: Payer: Self-pay | Admitting: Oncology

## 2016-02-12 ENCOUNTER — Telehealth: Payer: Self-pay | Admitting: *Deleted

## 2016-02-12 NOTE — Telephone Encounter (Signed)
Message from [place], [place] RN reporting pt has purpura-like rash across her chest. Began as petechial rash and is now larger and purple. Pt appears comfortable, per Ria Comment. No other issues. Dr. Benay Spice made aware: May be due to prolonged steroid use. No intervention is needed, per MD. Ria Comment, RN made aware.

## 2016-02-14 ENCOUNTER — Telehealth: Payer: Self-pay | Admitting: *Deleted

## 2016-02-24 IMAGING — CR DG ANKLE COMPLETE 3+V*L*
3 series · 3 of 3 positions shown · non-contrast
Comparison: None.

CLINICAL DATA: 63-year-old female with twisting injury last night.
Pain in the cuboid area. Swelling. Initial encounter. Chemotherapy
patient.

EXAM:
LEFT ANKLE COMPLETE - 3+ VIEW

[x ankle lat left]
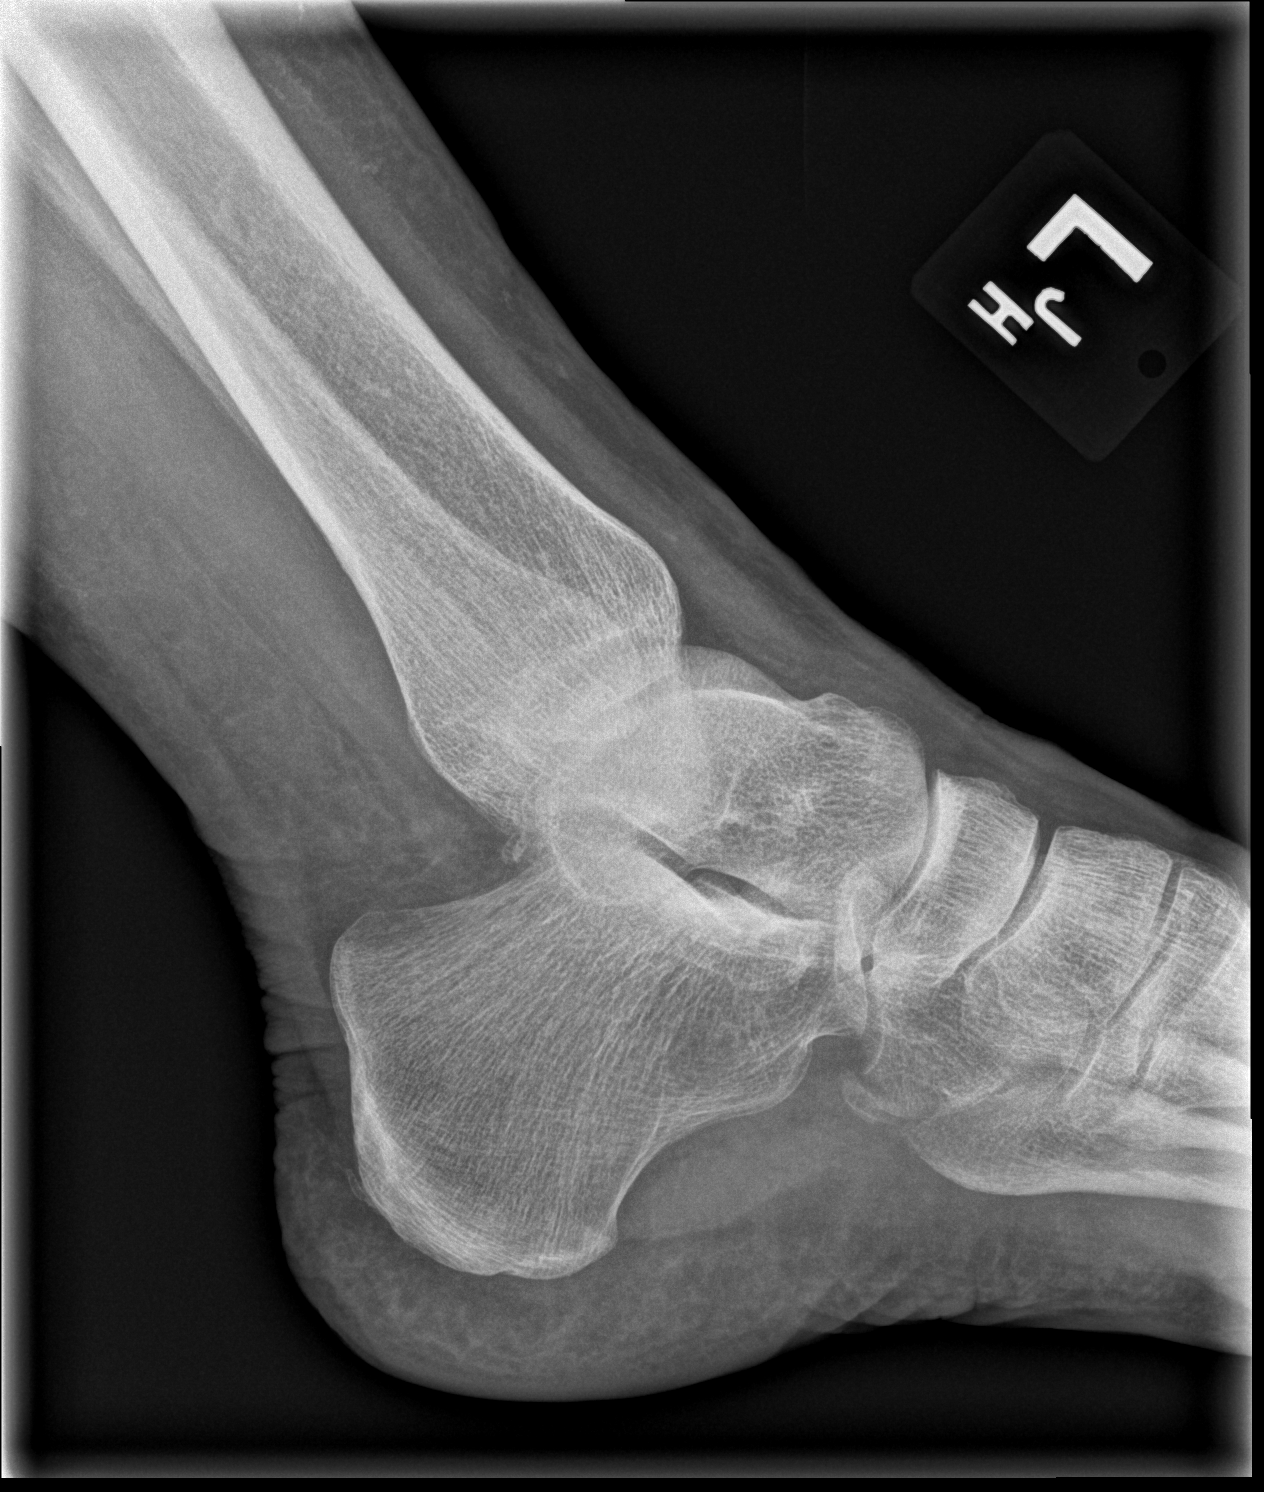

[x ankle ap left]
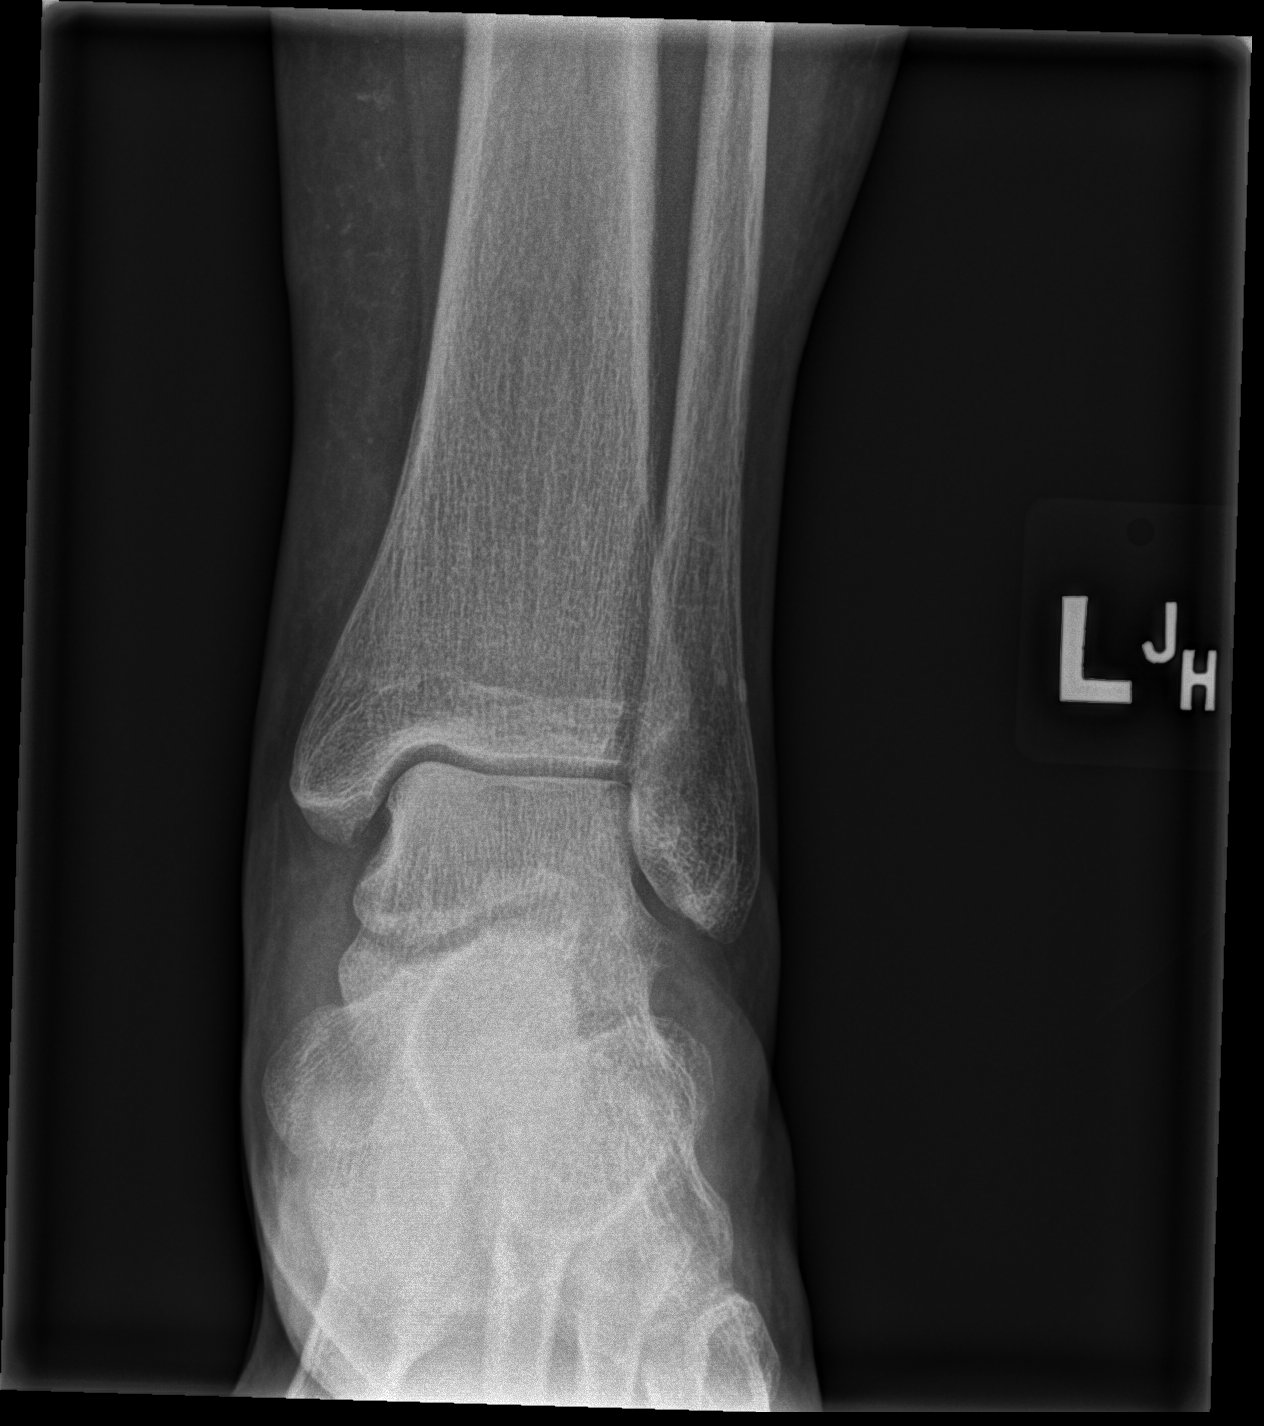

[x ankle obl left]
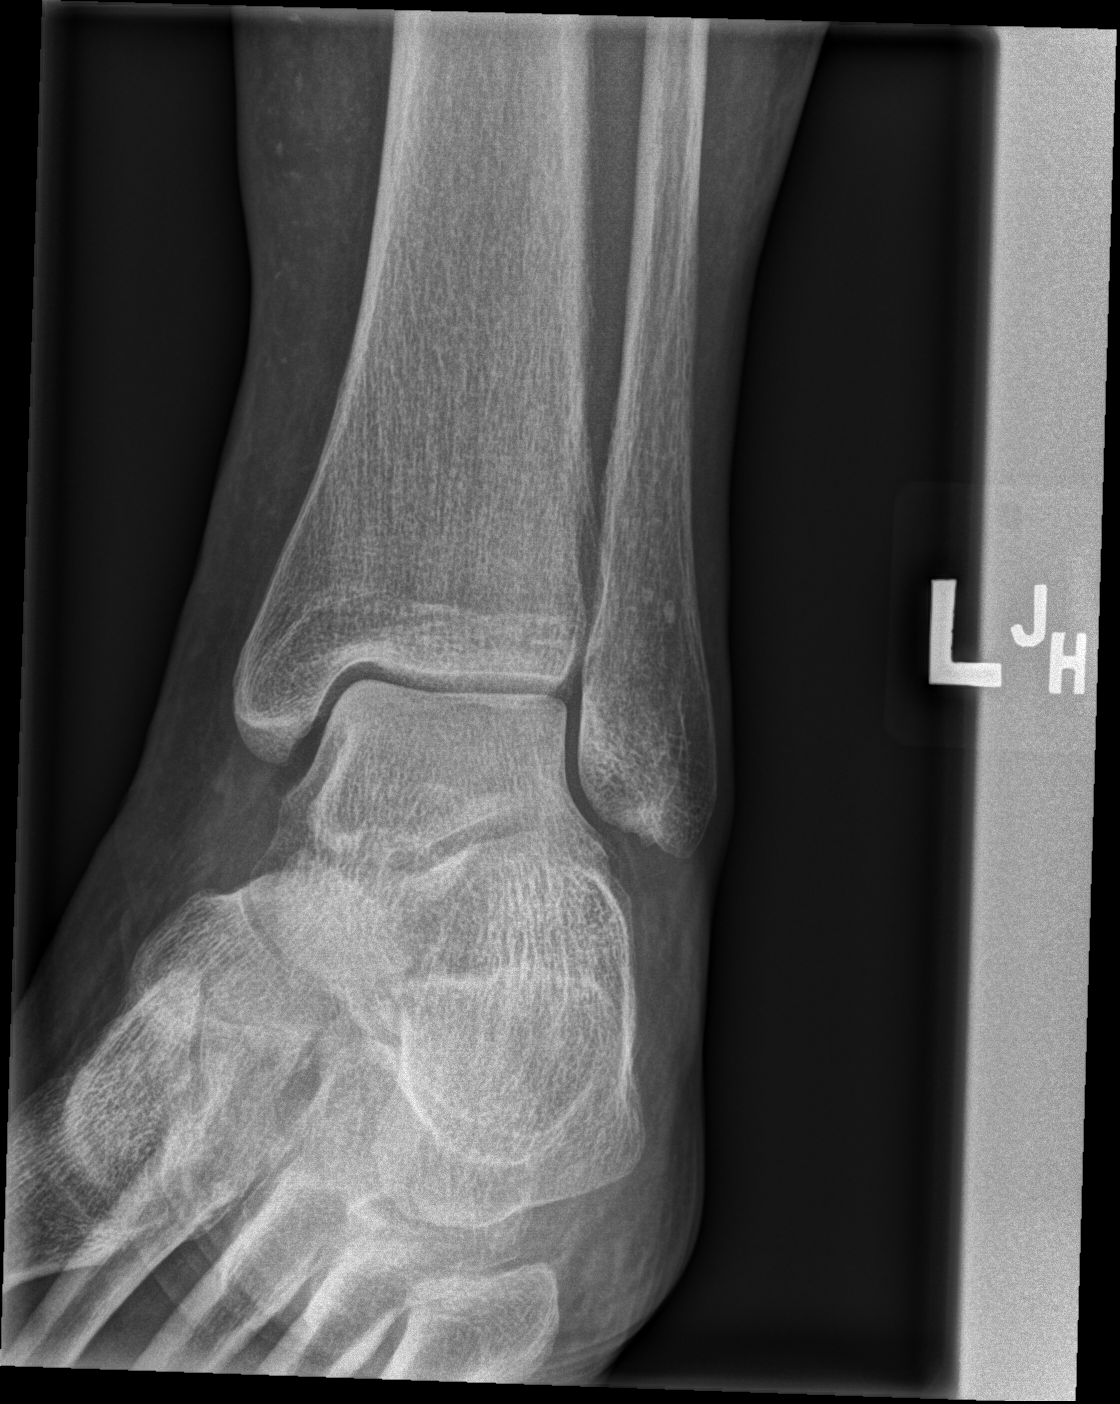

[3 of 3 positions shown; findings below may reference images not displayed]

FINDINGS: Normal mortise joint alignment. No ankle joint effusion. Calcaneus
and talar dome appear intact. Distal left tibia and fibula appear
intact.
IMPRESSION: No acute fracture or dislocation identified about the left ankle.

## 2016-03-01 NOTE — Telephone Encounter (Signed)
Call received from [facility] to inform Dr. Benay Spice that pt has passed away.  Medical records notified.

## 2016-03-01 DEATH — deceased

## 2016-05-15 ENCOUNTER — Other Ambulatory Visit: Payer: Self-pay | Admitting: Nurse Practitioner

## 2016-11-02 IMAGING — CT CT HEAD W/O CM
2 series · 15 of 30 positions shown, 19 images · non-contrast
Comparison: PET/CT performed 05/23/2014, and MRI of the brain
performed 04/07/2014

CLINICAL DATA: Acute onset right of upper extremity tremors.
Initial encounter.

EXAM:
CT HEAD WITHOUT CONTRAST
TECHNIQUE: Contiguous axial images were obtained from the base of the skull
through the vertex without intravenous contrast.

[Series 2: head w/o · axial · non-contrast · 0.43mm/px · z∈[-88,+36]mm · 13 of 31 slices shown, 17 images]
[im 3/31  brain]
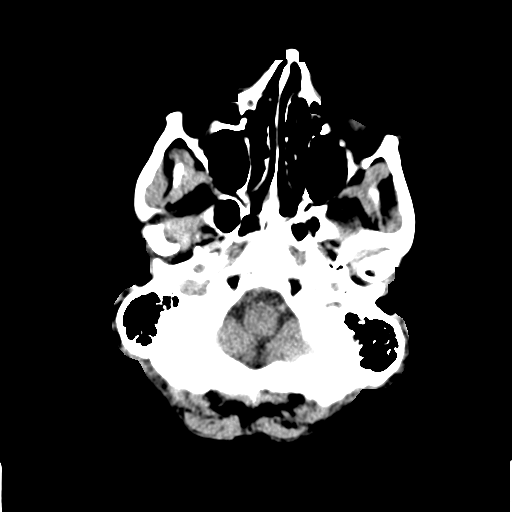
[im 3/31  bone]
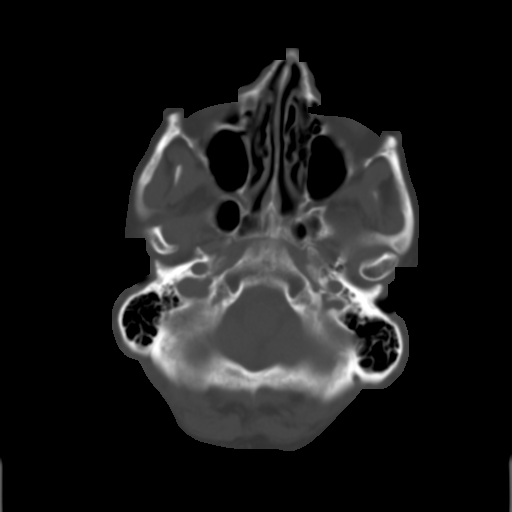
[im 5/31  brain]
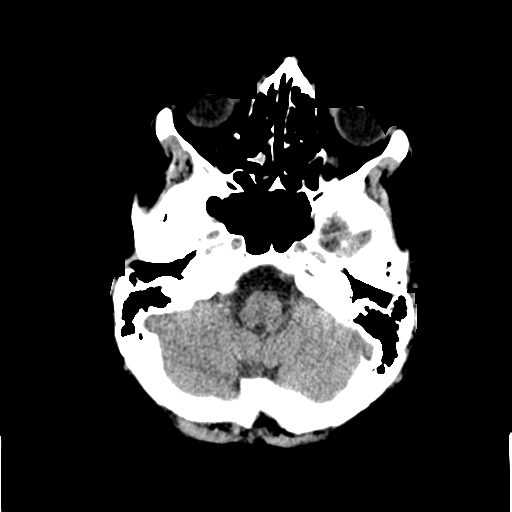
[im 7/31  brain]
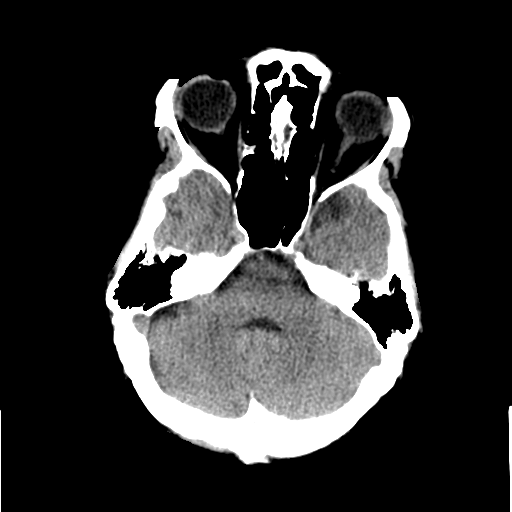
[im 9/31  brain]
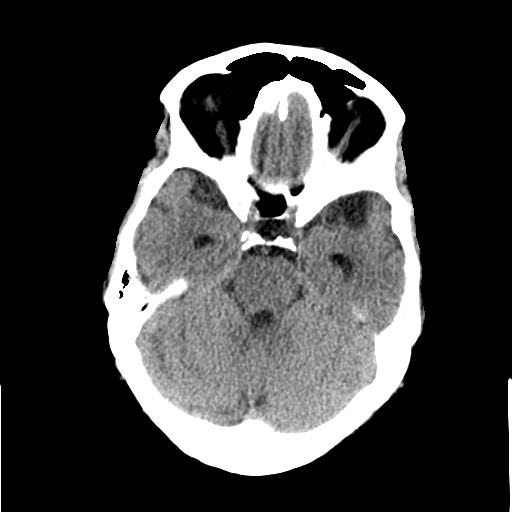
[im 11/31  brain]
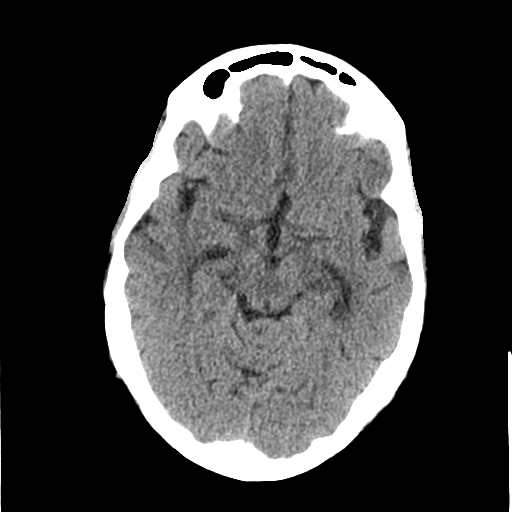
[im 11/31  bone]
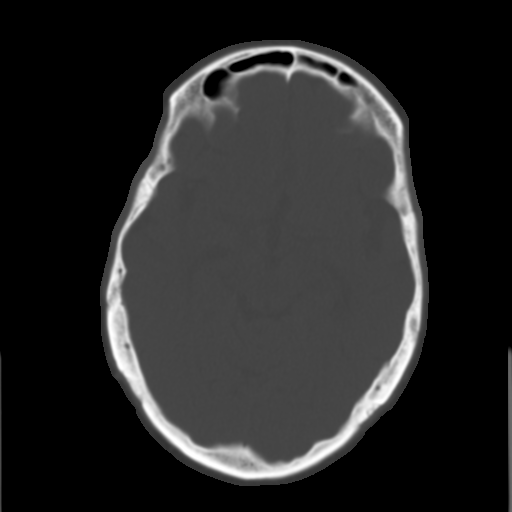
[im 13/31  brain]
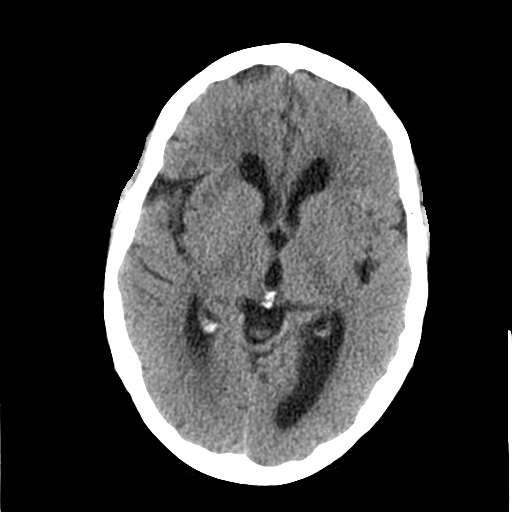
[im 16/31  brain]
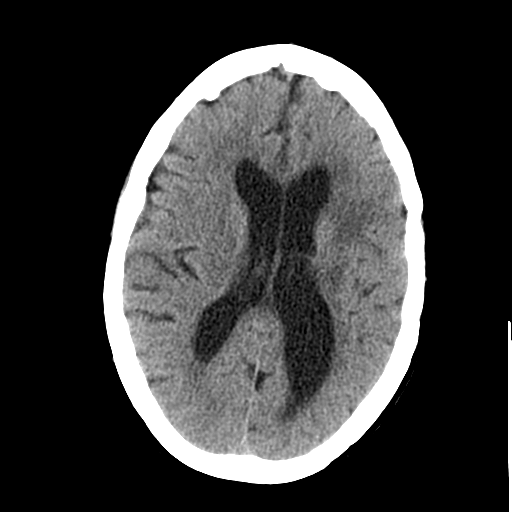
[im 18/31  brain]
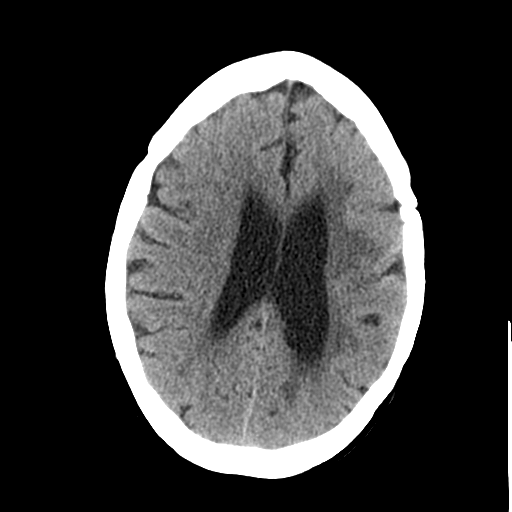
[im 20/31  brain]
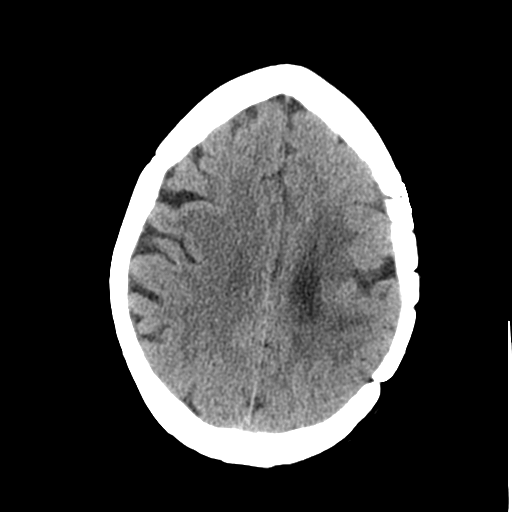
[im 20/31  bone]
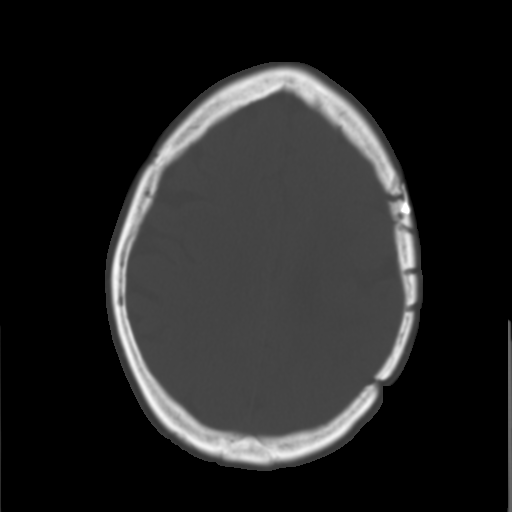
[im 22/31  brain]
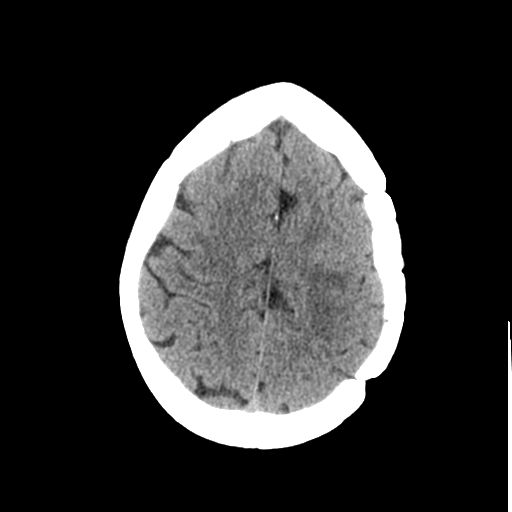
[im 24/31  brain]
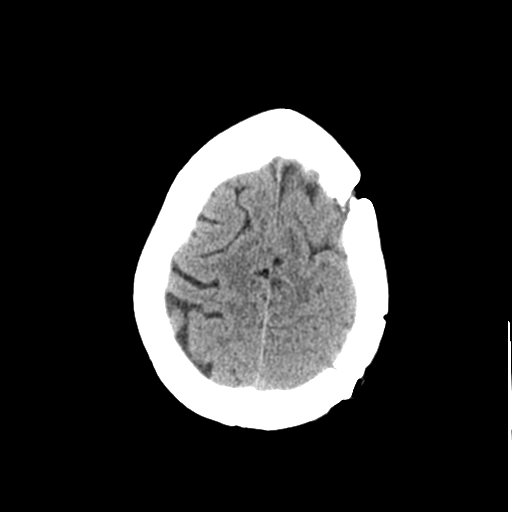
[im 26/31  brain]
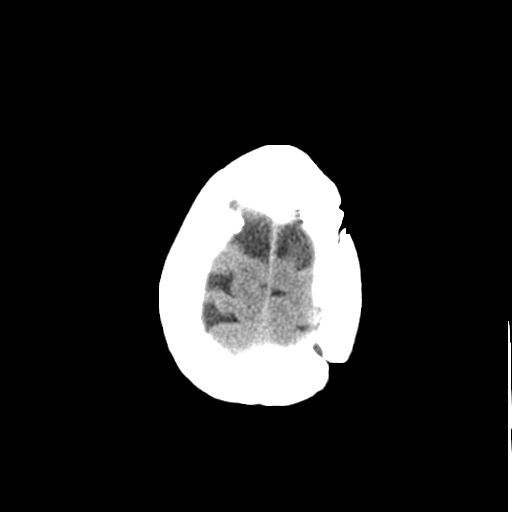
[im 28/31  brain]
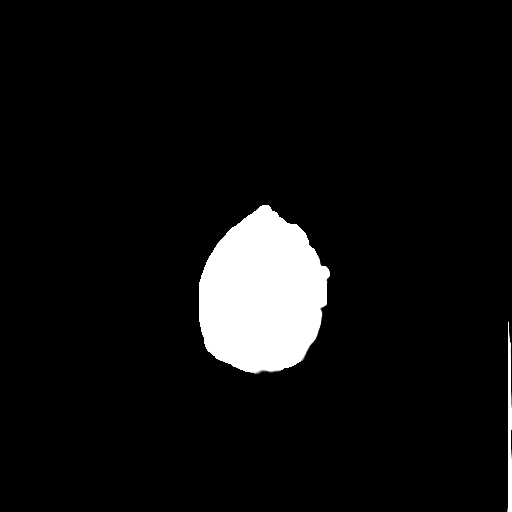
[im 28/31  bone]
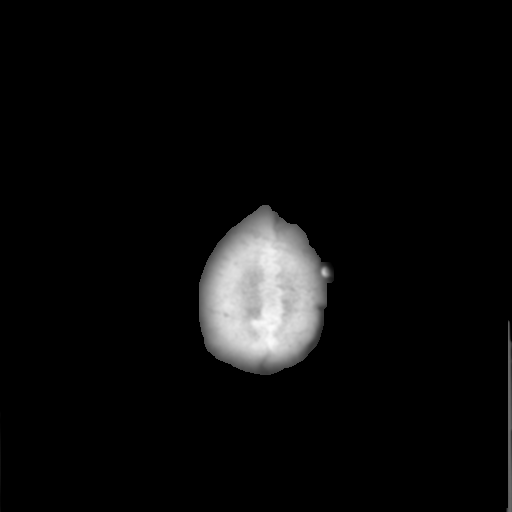

[Series 3: bone windows · axial · 0.43mm/px · z∈[-88,-68]mm · 2 of 31 slices shown]
[im 3/31  bone]
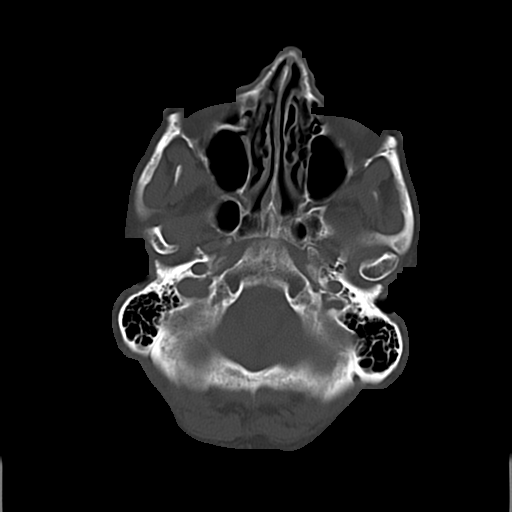
[im 7/31  bone]
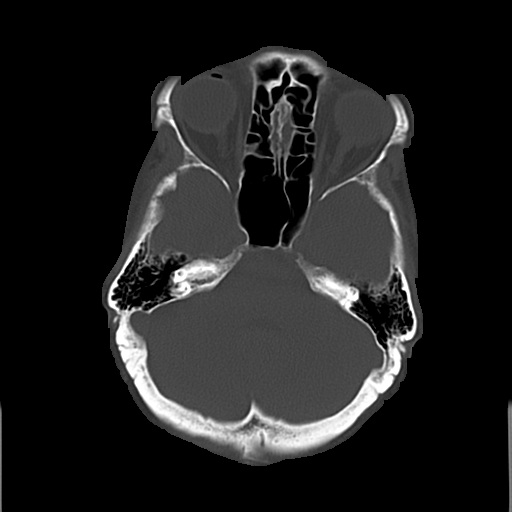

[15 of 30 positions shown; findings below may reference images not displayed]

FINDINGS: The patient is status post resection of the previously noted left
posterior frontal lobe mass. Underlying decreased attenuation is of
uncertain significance. No defined mass is seen at this time, though
evaluation for recurrent mass is limited on CT. An overlying
craniotomy flap is noted.

Prominence of the ventricles suggests mild cortical volume loss.
There is no evidence of acute infarct. No intracranial hemorrhage is
seen.

The basal ganglia are grossly unremarkable in appearance. The
cerebellum, brainstem and fourth ventricle are within normal limits.
No significant mass effect or midline shift is seen at this time.

The visualized osseous structures are grossly unremarkable. There is
no evidence of fracture. The orbits are unremarkable in appearance.
The visualized paranasal sinuses and mastoid air cells are
well-aerated. The orbits are unremarkable in appearance. No
significant soft tissue abnormalities are seen.
IMPRESSION: 1. Status post resection of previously noted left posterior frontal
lobe mass. Underlying vaguely decreased attenuation is of uncertain
significance. No defined mass is seen at this time, though
evaluation for recurrent mass is limited on CT. If there is
significant clinical concern for recurrent mass, MRI could be
considered for further evaluation.
2. Underlying mild cortical volume loss noted.
